# Patient Record
Sex: Female | Born: 1945 | Race: White | Hispanic: No | Marital: Married | Smoking: Never smoker
Health system: Southern US, Community
[De-identification: ages and names within clinical notes are randomized; demographics above are authoritative.]

## PROBLEM LIST (undated history)

## (undated) DIAGNOSIS — E039 Hypothyroidism, unspecified: Secondary | ICD-10-CM

## (undated) DIAGNOSIS — K8020 Calculus of gallbladder without cholecystitis without obstruction: Secondary | ICD-10-CM

## (undated) DIAGNOSIS — I10 Essential (primary) hypertension: Secondary | ICD-10-CM

## (undated) DIAGNOSIS — I480 Paroxysmal atrial fibrillation: Secondary | ICD-10-CM

## (undated) DIAGNOSIS — Z951 Presence of aortocoronary bypass graft: Secondary | ICD-10-CM

## (undated) DIAGNOSIS — E785 Hyperlipidemia, unspecified: Secondary | ICD-10-CM

## (undated) DIAGNOSIS — E119 Type 2 diabetes mellitus without complications: Secondary | ICD-10-CM

## (undated) DIAGNOSIS — I2510 Atherosclerotic heart disease of native coronary artery without angina pectoris: Secondary | ICD-10-CM

## (undated) HISTORY — DX: Type 2 diabetes mellitus without complications: E11.9

## (undated) HISTORY — DX: Calculus of gallbladder without cholecystitis without obstruction: K80.20

## (undated) HISTORY — DX: Atherosclerotic heart disease of native coronary artery without angina pectoris: I25.10

## (undated) HISTORY — DX: Essential (primary) hypertension: I10

## (undated) HISTORY — DX: Hyperlipidemia, unspecified: E78.5

## (undated) HISTORY — PX: ABDOMINAL HYSTERECTOMY: SHX81

## (undated) HISTORY — DX: Hypothyroidism, unspecified: E03.9

## (undated) HISTORY — DX: Presence of aortocoronary bypass graft: Z95.1

## (undated) HISTORY — DX: Paroxysmal atrial fibrillation: I48.0

---

## 2000-03-19 ENCOUNTER — Emergency Department (HOSPITAL_COMMUNITY): Admission: EM | Admit: 2000-03-19 | Discharge: 2000-03-20 | Payer: Self-pay | Admitting: Emergency Medicine

## 2000-07-10 ENCOUNTER — Encounter: Admission: RE | Admit: 2000-07-10 | Discharge: 2000-07-10 | Payer: Self-pay | Admitting: Obstetrics and Gynecology

## 2000-07-10 ENCOUNTER — Encounter: Payer: Self-pay | Admitting: Obstetrics and Gynecology

## 2000-07-13 ENCOUNTER — Encounter: Payer: Self-pay | Admitting: Obstetrics and Gynecology

## 2000-07-13 ENCOUNTER — Encounter: Admission: RE | Admit: 2000-07-13 | Discharge: 2000-07-13 | Payer: Self-pay | Admitting: Obstetrics and Gynecology

## 2000-07-17 ENCOUNTER — Other Ambulatory Visit: Admission: RE | Admit: 2000-07-17 | Discharge: 2000-07-17 | Payer: Self-pay | Admitting: Obstetrics and Gynecology

## 2001-11-15 ENCOUNTER — Encounter: Admission: RE | Admit: 2001-11-15 | Discharge: 2001-11-15 | Payer: Self-pay | Admitting: Obstetrics and Gynecology

## 2001-11-15 ENCOUNTER — Encounter: Payer: Self-pay | Admitting: Obstetrics and Gynecology

## 2002-04-01 ENCOUNTER — Encounter: Payer: Self-pay | Admitting: Obstetrics and Gynecology

## 2002-04-01 ENCOUNTER — Encounter: Admission: RE | Admit: 2002-04-01 | Discharge: 2002-04-01 | Payer: Self-pay | Admitting: Obstetrics and Gynecology

## 2002-04-24 ENCOUNTER — Other Ambulatory Visit: Admission: RE | Admit: 2002-04-24 | Discharge: 2002-04-24 | Payer: Self-pay | Admitting: General Surgery

## 2002-05-29 ENCOUNTER — Encounter: Payer: Self-pay | Admitting: General Surgery

## 2002-05-29 ENCOUNTER — Encounter: Admission: RE | Admit: 2002-05-29 | Discharge: 2002-05-29 | Payer: Self-pay | Admitting: General Surgery

## 2002-05-30 ENCOUNTER — Encounter (INDEPENDENT_AMBULATORY_CARE_PROVIDER_SITE_OTHER): Payer: Self-pay | Admitting: Specialist

## 2002-05-30 ENCOUNTER — Ambulatory Visit (HOSPITAL_BASED_OUTPATIENT_CLINIC_OR_DEPARTMENT_OTHER): Admission: RE | Admit: 2002-05-30 | Discharge: 2002-05-30 | Payer: Self-pay | Admitting: General Surgery

## 2002-06-09 ENCOUNTER — Emergency Department (HOSPITAL_COMMUNITY): Admission: EM | Admit: 2002-06-09 | Discharge: 2002-06-09 | Payer: Self-pay | Admitting: Emergency Medicine

## 2002-06-09 ENCOUNTER — Encounter: Payer: Self-pay | Admitting: Emergency Medicine

## 2003-04-29 ENCOUNTER — Encounter: Admission: RE | Admit: 2003-04-29 | Discharge: 2003-04-29 | Payer: Self-pay | Admitting: Obstetrics and Gynecology

## 2003-11-13 ENCOUNTER — Ambulatory Visit (HOSPITAL_COMMUNITY): Admission: RE | Admit: 2003-11-13 | Discharge: 2003-11-13 | Payer: Self-pay | Admitting: Obstetrics and Gynecology

## 2003-12-01 ENCOUNTER — Ambulatory Visit (HOSPITAL_COMMUNITY): Admission: RE | Admit: 2003-12-01 | Discharge: 2003-12-01 | Payer: Self-pay | Admitting: Obstetrics and Gynecology

## 2006-02-28 ENCOUNTER — Encounter: Admission: RE | Admit: 2006-02-28 | Discharge: 2006-02-28 | Payer: Self-pay | Admitting: Obstetrics and Gynecology

## 2006-03-05 ENCOUNTER — Encounter: Admission: RE | Admit: 2006-03-05 | Discharge: 2006-03-05 | Payer: Self-pay | Admitting: Obstetrics and Gynecology

## 2007-02-06 DIAGNOSIS — I2510 Atherosclerotic heart disease of native coronary artery without angina pectoris: Secondary | ICD-10-CM

## 2007-02-06 HISTORY — DX: Atherosclerotic heart disease of native coronary artery without angina pectoris: I25.10

## 2007-02-28 ENCOUNTER — Encounter: Admission: RE | Admit: 2007-02-28 | Discharge: 2007-02-28 | Payer: Self-pay | Admitting: Cardiology

## 2007-03-01 ENCOUNTER — Encounter: Payer: Self-pay | Admitting: Cardiothoracic Surgery

## 2007-03-01 ENCOUNTER — Ambulatory Visit: Payer: Self-pay | Admitting: Cardiothoracic Surgery

## 2007-03-01 ENCOUNTER — Inpatient Hospital Stay (HOSPITAL_COMMUNITY): Admission: AD | Admit: 2007-03-01 | Discharge: 2007-03-10 | Payer: Self-pay | Admitting: Cardiology

## 2007-03-01 ENCOUNTER — Ambulatory Visit: Payer: Self-pay | Admitting: Surgery

## 2007-03-01 HISTORY — PX: CARDIAC CATHETERIZATION: SHX172

## 2007-03-05 HISTORY — PX: CORONARY ARTERY BYPASS GRAFT: SHX141

## 2007-03-28 ENCOUNTER — Encounter (HOSPITAL_COMMUNITY): Admission: RE | Admit: 2007-03-28 | Discharge: 2007-05-08 | Payer: Self-pay | Admitting: Cardiology

## 2007-03-29 ENCOUNTER — Ambulatory Visit: Payer: Self-pay | Admitting: Cardiothoracic Surgery

## 2007-03-29 ENCOUNTER — Encounter: Admission: RE | Admit: 2007-03-29 | Discharge: 2007-03-29 | Payer: Self-pay | Admitting: Cardiothoracic Surgery

## 2007-04-08 ENCOUNTER — Encounter: Admission: RE | Admit: 2007-04-08 | Discharge: 2007-07-07 | Payer: Self-pay | Admitting: Cardiology

## 2007-04-27 ENCOUNTER — Emergency Department (HOSPITAL_COMMUNITY): Admission: EM | Admit: 2007-04-27 | Discharge: 2007-04-27 | Payer: Self-pay | Admitting: Emergency Medicine

## 2007-05-09 HISTORY — PX: INCONTINENCE SURGERY: SHX676

## 2007-05-14 ENCOUNTER — Encounter (HOSPITAL_COMMUNITY): Admission: RE | Admit: 2007-05-14 | Discharge: 2007-07-06 | Payer: Self-pay | Admitting: Cardiology

## 2008-05-07 ENCOUNTER — Encounter (INDEPENDENT_AMBULATORY_CARE_PROVIDER_SITE_OTHER): Payer: Self-pay | Admitting: *Deleted

## 2008-05-07 ENCOUNTER — Ambulatory Visit (HOSPITAL_COMMUNITY): Admission: RE | Admit: 2008-05-07 | Discharge: 2008-05-07 | Payer: Self-pay | Admitting: *Deleted

## 2008-07-03 ENCOUNTER — Encounter: Admission: RE | Admit: 2008-07-03 | Discharge: 2008-07-03 | Payer: Self-pay | Admitting: *Deleted

## 2009-02-19 ENCOUNTER — Ambulatory Visit: Payer: Self-pay | Admitting: Family Medicine

## 2009-02-19 ENCOUNTER — Inpatient Hospital Stay (HOSPITAL_COMMUNITY): Admission: AD | Admit: 2009-02-19 | Discharge: 2009-02-21 | Payer: Self-pay | Admitting: Family Medicine

## 2010-08-11 LAB — BASIC METABOLIC PANEL
BUN: 7 mg/dL (ref 6–23)
CO2: 29 mEq/L (ref 19–32)
Chloride: 102 mEq/L (ref 96–112)
Chloride: 103 mEq/L (ref 96–112)
GFR calc Af Amer: >60 mL/min (ref >60)
Potassium: 4.8 mEq/L (ref 3.5–5.1)
Sodium: 136 mEq/L (ref 135–145)
Sodium: 137 mEq/L (ref 135–145)

## 2010-08-11 LAB — DIFFERENTIAL
Basophils Absolute: 0 10*3/uL (ref 0.0–0.1)
Basophils Relative: 0 % (ref 0–1)
Eosinophils Absolute: 0.1 10*3/uL (ref 0.0–0.7)
Eosinophils Absolute: 0.1 10*3/uL (ref 0.0–0.7)
Eosinophils Relative: 2 % (ref 0–5)
Lymphocytes Relative: 16 % (ref 12–46)
Lymphs Abs: 2.2 10*3/uL (ref 0.7–4.0)
Monocytes Absolute: 1 10*3/uL (ref 0.1–1.0)
Monocytes Relative: 12 % (ref 3–12)
Neutrophils Relative %: 72 % (ref 43–77)

## 2010-08-11 LAB — GLUCOSE, CAPILLARY
Glucose-Capillary: 259 mg/dL — ABNORMAL HIGH (ref 70–99)
Glucose-Capillary: 277 mg/dL — ABNORMAL HIGH (ref 70–99)
Glucose-Capillary: 137 mg/dL — ABNORMAL HIGH (ref 70–99)
Glucose-Capillary: 50 mg/dL — ABNORMAL LOW (ref 70–99)

## 2010-08-11 LAB — URINALYSIS, MICROSCOPIC ONLY
Hgb urine dipstick: NEGATIVE
Protein, ur: NEGATIVE mg/dL
Urobilinogen, UA: 1 mg/dL (ref 0.0–1.0)

## 2010-08-11 LAB — COMPREHENSIVE METABOLIC PANEL
ALT: 21 U/L (ref 0–35)
Alkaline Phosphatase: 87 U/L (ref 39–117)
CO2: 29 mEq/L (ref 19–32)
Chloride: 103 mEq/L (ref 96–112)
GFR calc non Af Amer: 55 mL/min — ABNORMAL LOW (ref >60)
Glucose, Bld: 63 mg/dL — ABNORMAL LOW (ref 70–99)
Potassium: 3.8 mEq/L (ref 3.5–5.1)
Sodium: 138 mEq/L (ref 135–145)
Total Bilirubin: 0.5 mg/dL (ref 0.3–1.2)

## 2010-08-11 LAB — CBC
HCT: 31 % — ABNORMAL LOW (ref 36.0–46.0)
HCT: 31 % — ABNORMAL LOW (ref 36.0–46.0)
Hemoglobin: 10.8 g/dL — ABNORMAL LOW (ref 12.0–15.0)
Hemoglobin: 10.9 g/dL — ABNORMAL LOW (ref 12.0–15.0)
MCHC: 34.8 g/dL (ref 30.0–36.0)
MCV: 87.1 fL (ref 78.0–100.0)
MCV: 88.1 fL (ref 78.0–100.0)
Platelets: 227 10*3/uL (ref 150–400)
Platelets: 225 10*3/uL (ref 150–400)
RBC: 3.52 MIL/uL — ABNORMAL LOW (ref 3.87–5.11)
RBC: 3.56 MIL/uL — ABNORMAL LOW (ref 3.87–5.11)
RDW: 13.7 % (ref 11.5–15.5)
WBC: 6.3 10*3/uL (ref 4.0–10.5)
WBC: 14 10*3/uL — ABNORMAL HIGH (ref 4.0–10.5)

## 2010-08-11 LAB — LIPID PANEL
Triglycerides: 146 mg/dL (ref <150)
VLDL: 29 mg/dL (ref 0–40)

## 2010-08-11 LAB — HEMOGLOBIN A1C: Mean Plasma Glucose: 246 mg/dL

## 2010-09-20 NOTE — Cardiovascular Report (Signed)
NAME:  [name], [name]                 ACCOUNT NO.:  [id number]   MEDICAL RECORD NO.:  [id number]          PATIENT TYPE:  INP   LOCATION:  3714                         FACILITY:  [facility]   PHYSICIAN:  Ritta Slot, MD     DATE OF BIRTH:  Jun 13, 1945   DATE OF PROCEDURE:  03/01/2007  DATE OF DISCHARGE:                            CARDIAC CATHETERIZATION   CARDIAC CATHETERIZATION   CARDIOLOGIST:  Ritta Slot, MD   PROCEDURES PERFORMED:  1. Selective coronary angiography with a native coronary circulation      by Judkins.  2. Retrograde left heart catheterization.  3. Left ventricular angiography.   COMPLICATIONS:  None.   ENTRY SITE:  Right femoral.   LOCAL ANESTHETIC USED:  20 mL of 1% lidocaine.   SEDATION USED:  25 mg of fentanyl, 2 mg of Versed intravenously.   PATIENT PROFILE:  The patient is a 65 year old Caucasian female with  long-standing hypertension, diabetes, dyslipidemia who presented to the  office yesterday with exertional angina symptoms.  She did have a  nuclear perfusion scan performed in February 2008 which demonstrated no  evidence of vessel ischemia but transient ischemic dilatation with a  global post-stress ejection fraction of 38%.  At the time, she also had  ECG changes of ST segment depression, inferiorly, with ST segment  elevation, V1 and AVR.   The patient was brought to the second floor of the [facility]  for the cardiac catheterization laboratory and the right groin was  prepped and draped in the usual sterile fashion.  Lidocaine, 1%, was  infiltrated locally around the right groin area as a local anesthetic.  The right femoral artery was accessed using a Smart needle and a 6-  [place] sheath was inserted over a short wire into the right femoral  artery.  Next, the JR-4 catheter was inserted over a regular J-wire  through the 6-French sheath and was passed up the descending aorta, over  the aortic arch, and taken to the ascending aorta.   The right coronary  artery was then selectively engaged using the JR-4 catheter and images  were obtained in LAO cranial and RAO cranial views.  The JR-4 catheter  was then exchanged over the J-wire for a JL-4 catheter.  The JL-4  catheter was then passed up the descending aorta, over the aortic arch  and down the ascending aorta.  The JL-4 catheter was then engaged into  the left main coronary artery.  Views of the left coronary artery system  were taken from the LAO cranial, LAO caudal, RAO caudal, RAO cranial and  left lateral positions.  The JL-4 catheter was then exchanged out over  the J-wire for a bent pigtail catheter.  The pigtail catheter was then  inserted into the left ventricle.  Pressures were recorded from the left  ventricle.  A left ventriculogram was performed using 30 mL of contrast  at 10 mL/sec in the RAO projection.   FINDINGS:  Pressures:  The left ventricular pressure was 158/13 with an  end diastolic of 22.  The aortic pressure was 138/63 with a  mean of 92.  There was no aortic stenosis by pullback gradient technique.   ANGIOGRAPHIC RESULTS:  1. Left main coronary artery appears normal.  2. The left anterior descending artery has a 99% proximal lesion just      off the bifurcation of the left anterior descending and circumflex.      It is a narrow vessel but long and reaches far down toward the      apex.  The left circumflex system bifurcates early in its proximal      portion of a small circumflex and a large obtuse marginal one.  At      the bifurcation of the circumflex, there is a 99% lesion; at the      bifurcation of  the larger obtuse marginal one, there is a 99%      lesion as well.  There is a ramus intermedius vessel that comes off      of the bifurcation of the left main circumflex and left anterior      descending.  There is a proximal, 70%, lesion at that vessel.  The      right coronary artery is dominant and large and supplies the       posterolateral and posterior descending arteries.  There is a 90%      lesion in its proximal course.   LEFT VENTRICULOGRAPHY:  Showed mild to moderate global hypokinesis and  an ejection fraction of 40- to 45%.  There is no evidence of mitral  regurgitation.   IMPRESSION/FINAL DIAGNOSES:  Severe native, 3-vessel, coronary artery  disease, mild left ventricular dysfunction, most likely secondary to  hibernation.   PLAN:  The patient will be admitted to [facility]  for carotid  Doppler and CT surgery consultation.  She will be kept in the hospital  until she has her surgery.      Ritta Slot, MD  Electronically Signed     HS/MEDQ  D:  03/01/2007  T:  03/01/2007  Job:  [id number]

## 2010-09-20 NOTE — Discharge Summary (Signed)
[name], [name]                 ACCOUNT NO.:  [id number]   MEDICAL RECORD NO.:  [id number]          PATIENT TYPE:  INP   LOCATION:  2018                         FACILITY:  [facility]   PHYSICIAN:  Kerin Perna, M.D.  DATE OF BIRTH:  April 25, 1946   DATE OF ADMISSION:  03/01/2007  DATE OF DISCHARGE:  03/10/2007                               DISCHARGE SUMMARY   PRIMARY ADMITTING DIAGNOSIS:  Chest pain.   ADDITIONAL/DISCHARGE DIAGNOSES:  1. Severe three-vessel coronary artery disease.  2. Class III angina.  3. Type 2 diabetes mellitus.  4. Hypertension.  5. Dyslipidemia.  6. 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  7. Postoperative atrial fibrillation.   PROCEDURES PERFORMED:  1. Cardiac catheterization.  2. Coronary artery bypass grafting x4 (left internal mammary artery to      the LAD, saphenous vein graft to the PDA, saphenous vein graft to      the first ramus, saphenous vein graft to the second ramus).  3. Endoscopic vein harvest left leg.   HISTORY:  The patient is a 65 year old female who over the month  preceding this admission developed symptoms of chest discomfort with  exertion.  This was relieved with rest.  She was subsequently referred  to Dr. Lynnea Ferrier for further evaluation.  She had undergone a previous  nuclear perfusion study in February 2008 which showed significant ST and  T-wave changes on the treadmill.  Also she had multiple risk factors for  coronary artery disease as listed above.  Because of this, Dr. Lynnea Ferrier  felt that her symptoms warranted cardiac catheterization, and she was  scheduled for outpatient cath at [facility] on March 01, 2007.   HOSPITAL COURSE:  [name] underwent cardiac catheterization on March 01, 2007 by Dr. Lynnea Ferrier.  She was found to have severe three-vessel  coronary disease with a long 99% proximal LAD stenosis, 90% stenosis of  the right coronary artery, 70% stenosis of the ramus intermediate and  90% stenosis of the small  nondominant circumflex with an ejection  fraction of 40-45%.  Because of these findings, she was admitted and a  cardiothoracic surgery consultation was obtained.  Dr. Kathlee Nations Trigt  saw the patient and reviewed her films and agreed that her best course  of action would be to undergo surgical revascularization at this time.  He explained the risks, benefits and alternatives of the procedure to  the patient, and she agreed to proceed with surgery.  She remained  stable and painfree in the hospital during her preoperative workup.  She  was taken to the operating room on March 05, 2007 and underwent CABG  x4 as described in detail above.  She tolerated the procedure well and  was transferred to the SICU in stable condition.  She developed swelling  of the tongue postoperatively of unclear etiology and was treated with  IV steroids and remained intubated until this resolved.  She was able to  be extubated on postop day two without difficulty.  She remained in the  unit for further observation.  She was started on diuretics for  mild  fluid overload.  Chest tubes and hemodynamic monitoring devices were  removed.  By the end of postop day #2 she was able to be transferred to  the floor.  She did have one episode of atrial fibrillation/flutter  which resolved spontaneously.  She was started on p.o. amiodarone and  continued on a beta blocker and ACE inhibitor and statin.  Also once her  p.o. intake had improved her home diabetes medication regimen was  resumed, and the dose titrated upward.  Overall her postoperative course  was uneventful.  She maintained normal sinus rhythm throughout the  remainder of her hospitalization.  She was diuresed back down to her  preoperative weight.  At the time of discharge her incisions have healed  well.  She was afebrile, and her vital signs were stable.   Her most recent labs show hemoglobin 10.2, hematocrit 29.7, platelets  192, white count 11.3 sodium  139, potassium 3.9, BUN 14, creatinine 0.4.   After morning round evaluation she was deemed ready for discharge home  on March 10, 2007.   DISCHARGE MEDICATIONS:  1. Enteric-coated aspirin 325 mg daily.  2. Lopressor 25 mg b.i.d.  3. Lisinopril 20 mg daily.  4. Zetia 10 mg daily.  5. Zocor 10 mg daily.  6. Humulin N 43 units b.i.d.  7. Humalog sliding scale insulin as directed.  8. Lasix 20 mg daily.  9. Effexor 150 mg daily.  10.Amiodarone 400 mg b.i.d. x 5 days then once daily.  11.Multivitamin 1 daily.  12.Vitamin C daily.  13.Oxycodone 5 mg 1-2 q.4-6h. p.r.n. for pain.   DISCHARGE INSTRUCTIONS:  She is asked to refrain from driving, heavy  lifting or strenuous activity.  She may continue ambulating daily and  using her incentive spirometer.  She may shower daily and clean her  incisions with soap and water.  She will continue her same preoperative  diet.   DISCHARGE FOLLOWUP:  She will make an appointment to see Dr. Lynnea Ferrier in  2 weeks for follow-up.  She will see Dr. Donata Clay on March 29, 2007  with a chest x-ray from [facility].  If she experiences any  problems or has questions in the interim she is asked to contact our  office immediately.      Coral Ceo, P.A.      Kerin Perna, M.D.  Electronically Signed    GC/MEDQ  D:  04/17/2007  T:  04/18/2007  Job:  [id number]   cc:   Olene Craven, M.D.  Ritta Slot, MD

## 2010-09-20 NOTE — Op Note (Signed)
[name], [name]                 ACCOUNT NO.:  [id number]   MEDICAL RECORD NO.:  [id number]          PATIENT TYPE:  AMB   LOCATION:  ENDO                         FACILITY:  [facility]   PHYSICIAN:  Georgiana Spinner, M.D.    DATE OF BIRTH:  1945-09-04   DATE OF PROCEDURE:  05/07/2008  DATE OF DISCHARGE:                               OPERATIVE REPORT   PROCEDURE:  Colonoscopy.   INDICATIONS:  Abdominal pain, colon cancer screening.   ANESTHESIA:  Fentanyl 50 mcg, Versed 3 mg.   PROCEDURE IN DETAIL:  The patient indicated that she had taken no more  than 3/4 of the prep at most.  Subsequently the Pentax videoscopic  pediatric colonoscope was inserted into the rectum and it was noted that  there was solid stool in the rectum and obviously it was going to be a  poor prep.  We had to withdraw the scope, clear off the lens, reinsert  the endoscope and advance it, subsequently under direct vision with  pressure applied and the patient rolled to her back we were able to  subsequently reach the cecum identified by ileocecal valve and  appendiceal orifice, both of which were photographed.  From this point  the colonoscope was slowly withdrawn taking circumferential views of  colonic mucosa stopping in the transverse colon where a polyp was seen,  photographed and removed using snare cautery technique setting of 20/150  blended current and there was a remnant of the polyp remaining which we  then grasped with a hot biopsy forceps tech and removed for pathology.  The endoscope was then further withdrawn taking circumferential views of  remaining colonic mucosa stopping in the rectum which appeared normal on  direct and retroflexed view showed hemorrhoidal tissue.  The endoscope  was straightened and withdrawn.  The patient's vital signs, pulse  oximeter remained stable.  The patient tolerated procedure well without  apparent complications.   FINDINGS:  Pandiverticulosis, moderately severe polyp of  transverse  colon, internal hemorrhoids and this was a poor prep, therefore, small  to even medium sized lesions could be missed.  The patient did not take  the prep as ordered.   PLAN:  Await biopsy report.  The patient will call me for results and  follow up with me as an outpatient.           ______________________________  Georgiana Spinner, M.D.     GMO/MEDQ  D:  05/07/2008  T:  05/07/2008  Job:  [id number]

## 2010-09-20 NOTE — Op Note (Signed)
[name], [name]                 ACCOUNT NO.:  [id number]   MEDICAL RECORD NO.:  [id number]          PATIENT TYPE:  AMB   LOCATION:  ENDO                         FACILITY:  [facility]   PHYSICIAN:  Georgiana Spinner, M.D.    DATE OF BIRTH:  01-24-1946   DATE OF PROCEDURE:  05/07/2008  DATE OF DISCHARGE:                               OPERATIVE REPORT   PROCEDURE:  Upper endoscopy.   INDICATIONS:  Abdominal pain.   ANESTHESIA:  Fentanyl 50 mcg, Versed 6 mg   PROCEDURE:  With the patient mildly sedated in the left lateral  decubitus position,  the Pentax videoscopic endoscope was inserted in  the mouth, passed under direct vision through the esophagus which  appeared normal into the stomach.  Fundus, body, antrum, duodenal bulb,  second portion duodenum were visualized and appeared normal.  From this  point the endoscope was slowly withdrawn, taking circumferential views  of duodenal mucosa until the endoscope had been pulled back into the  stomach, placed in retroflexion to view the stomach from below.  The  endoscope was straightened and withdrawn, taking circumferential views  of the remaining gastric and esophageal mucosa.  The patient's vital  signs and pulse oximeter remained stable.  The patient tolerated the  procedure well without apparent complications.   FINDINGS:  Normal examination planned.  Proceed to colonoscopy.           ______________________________  Georgiana Spinner, M.D.     GMO/MEDQ  D:  05/07/2008  T:  05/07/2008  Job:  [id number]

## 2010-09-20 NOTE — Op Note (Signed)
NAMEAVON, MOLOCK                 ACCOUNT NO.:  [id number]   MEDICAL RECORD NO.:  [id number]          PATIENT TYPE:  INP   LOCATION:  2312                         FACILITY:  [facility]   PHYSICIAN:  Kerin Perna, M.D.  DATE OF BIRTH:  09-21-1945   DATE OF PROCEDURE:  03/05/2007  DATE OF DISCHARGE:                               OPERATIVE REPORT   OPERATION:  1. Coronary artery bypass grafting x4 (left internal mammary artery to      left anterior descending, saphenous vein graft to ramus      intermediate, saphenous vein graft to obtuse marginal, saphenous      vein graft to posterior descending).  2. Endoscopic vein harvest of the greater saphenous vein from the left      leg.   SURGEON:  Kerin Perna, M.D.   ASSISTANT:  Danelle Earthly, PA-C   ANESTHESIA:  General.   PREOPERATIVE DIAGNOSIS:  Severe three-vessel coronary disease with class  III progressive angina and reduced left ventricular function.   POSTOPERATIVE DIAGNOSIS:  Severe three-vessel coronary disease with  class III progressive angina and reduced left ventricular function.   INDICATIONS:  The patient is a 65 year old female with exertional angina  with increasing frequency and severity.  Cardiac catheterization by Dr.  Lynnea Ferrier demonstrated severe three-vessel coronary disease and a diabetic  pattern.  She was felt to be a candidate for surgical coronary  revascularization and was scheduled for surgery today.  She remained  stable in the hospital following cath before the surgery.   Prior to the operation, I reviewed the results of cardiac cath with the  patient and her family.  I discussed the indications and expected  benefits of coronary bypass surgery for treatment of her coronary  disease.  I reviewed the alternatives to surgical therapy as well.  I  discussed the major issues of the procedure including the choice of  conduit for grafts, location of the surgical incisions, use of general  anesthesia  and cardiopulmonary bypass, and the expected postoperative  hospital recovery.  I reviewed with the patient the risks to her of this  core of this operation, to the risks of MI, CVA, bleeding, blood  transfusion, infection, and death.  After reviewing these issues, she  demonstrated her understanding agreed to proceed with operation as  planned under what I felt was an informed consent.   OPERATIVE FINDINGS:  The patient had a sluggish hypocontractile heart  upon opening the chest up.  The patient had baseline anemia and required  3 units of packed cell transfusion during the operation maintain a  hemoglobin of 8 grams or greater.  The coronaries had diffuse disease in  a diabetic pattern.  The distal circumflex vessels were too small to  graft.  The patient has a right aortic arch which altered the usual  approach to aortic cannulation.   DESCRIPTION OF PROCEDURE:  The patient was brought to the operating room  and placed on the operating table where general anesthesia was induced  under invasive hemodynamic monitoring.  The chest, abdomen and legs were  prepped  with Betadine and draped as a sterile field.  A sternal incision  was made and the saphenous vein was harvested endoscopically from the  left leg.  The right leg was opened but the vein was found to be  inadequate and no vein was removed from the right leg.  The left  internal mammary artery was harvested as a pedicle graft from its origin  at the subclavian vessels and was a good vessel with excellent flow.  The pericardium was opened and suspended and heparin was administered.  When the vein had been harvested and inspected and found be adequate,  the patient was heparinized and pursestrings were placed in the  ascending aorta and right atrium.  The patient was then cannulated and  placed on bypass.  The coronaries were identified for grafting and the  mammary artery and vein grafts were prepared for the distal anastomoses.   Cardioplegia catheters were placed for both antegrade and retrograde  cold blood cardioplegia.  The patient was cooled to 32 degrees and  aortic crossclamp was applied.  800 mL of cold blood cardioplegia was  delivered in split doses between the antegrade aortic and retrograde  coronary sinus catheters.  There is good cardioplegic arrest and septal  temperature dropped to less than 10 degrees.   The distal coronary anastomoses were performed.  The first distal  anastomosis was to the posterior descending.  This a 1.5-mm vessel with  proximal 90% stenosis and a reverse saphenous vein was sewn end-to-side  with running 7-0 Prolene with good flow through the graft.  The second  distal anastomosis was to the ramus intermediate.  This was a 1.0-mm  vessel with heavy calcified 90% stenosis proximally.  A reverse  saphenous vein was sewn end-to-side with running 7-0 Prolene with good  flow through graft.  Cardioplegia was redosed.  The third distal  anastomosis was to the OM branch just distal to the ramus.  This was a  1.0-mm vessel proximal 90% stenosis and a calcified plaque.  A reverse  saphenous vein was sewn end-to-side with running 7-0 Prolene.  There was  fair flow through this graft.  Cardioplegia was redosed.  The fourth  distal anastomosis was to the distal LAD.  It was heavily calcified  proximally.  It had a proximal 99% stenosis.  Left IMA pedicle was  brought through an opening created in the left lateral pericardium, was  brought down onto the LAD and sewn end-to-side with running 8-0 Prolene.  There was good flow through the anastomosis after briefly releasing the  pedicle bulldog on the mammary artery.  The mammary bulldog was  reapplied and a pedicle secured to the epicardium.  Cardioplegia was  redosed.   While the crossclamp was still in place, three proximal vein anastomoses  were performed on the ascending aorta, using a 4.0-mm punch running 7-0  Prolene.  Prior to tying  down the final proximal anastomosis, air was  vented from the coronaries with a dose of retrograde warm blood  cardioplegia.  The final proximal anastomosis was tied and the  crossclamp was removed.   The heart resumed a spontaneous rhythm.  Air was aspirated from the vein  grafts which were opened and each had good flow.  Hemostasis was  documented at the proximal and distal anastomoses.  The cardioplegia  catheters were removed.  Temporary pacing wires were applied.  The  patient was rewarmed to 37 degrees.  The lungs were then re-expanded and  the ventilator  was resumed.  The patient was weaned off bypass on low-  dose dopamine with stable blood pressure and good cardiac output.  Protamine was administered without adverse reaction.  The cannulas were  removed.  Mediastinum was irrigated with warm antibiotic irrigation.  Leg incision was irrigated and closed in a standard fashion.  The  superior pericardial fat was closed over the aorta.  Two mediastinal and  left pleural chest tube were placed and brought out through separate  incisions.  The sternum was closed with interrupted steel wire.  The  pectoralis fascia was closed in running #1 Vicryl.  The subcutaneous and  skin layers were closed in running Vicryl and sterile dressings were  applied.  Total bypass time was 130 minutes with crossclamp time of 90  minutes.      Kerin Perna, M.D.  Electronically Signed    PV/MEDQ  D:  03/05/2007  T:  03/06/2007  Job:  [id number]   cc:   DCTS office  [facility]

## 2010-09-20 NOTE — Assessment & Plan Note (Signed)
OFFICE VISIT   [name], Tara K  DOB:  July 20, 1945                                        March 29, 2007  CHART #:  [id number]   CURRENT PROBLEMS:  1. Status post CABG x 4 March 05, 2007, for severe 3-coronary      disease with class 3 angina and reduced LV function.  2. Diabetes mellitus.  3. Right-sided aortic arch.  4. Perioperative atrial fibrillation with conversion to sinus rhythm      on amiodarone.   HISTORY OF PRESENT ILLNESS:  [name] is a 65 year old diabetic who  returns for her first postop office visit after CABG x 4 for multivessel  coronary disease with class 3 progressive angina.  She is doing well at  home.  She denies recurrent angina or symptoms of CHF and the surgical  incisions are healing well.  She is watching her diet and taking her  medications, which include lisinopril 20 mg, Zetia 10 mg, Cartia XT 180  mg, Lasix 20 mg, aspirin, metoprolol 25 b.i.d., Humulin insulin,  Effexor, and amiodarone.  The amiodarone was recently reduced to once a  day by Dr. Lynnea Ferrier.  She has maintained a sinus rhythm.  She is  scheduled to begin cardiac rehab next week and overall is getting  stronger and feeling better.   PHYSICAL EXAMINATION:  Blood pressure 105/60, pulse 72, respirations 18  and saturation 98%.  She is alert and pleasant.  Breath sounds are clear  and equal.  The sternum is stable and well-healed.  Cardiac rhythm is  regular without rub or gallop.  The leg incisions are well healed, and  there is trace ankle edema.  Vein was harvested from both thighs  endoscopically.   PA and lateral chest x-ray shows clear lung fields without pleural  effusion and a stable cardiac silhouette with the sternal wires well  aligned.   IMPRESSION:  The patient has progressed well the first month after  surgery.  She is ready to start rehab and drive herself back and forth  from cardiac rehab to the hospital.  She will continue her  current  medications.  She does not request any more pain medication.  She knows  to start low activity levels and to not lift more than 20 pounds until 3  months after the date of surgery.  Otherwise, she appears to be  recovering well and will return here as needed.   Kerin Perna, M.D.  Electronically Signed   PV/MEDQ  D:  03/29/2007  T:  03/30/2007  Job:  [id number]   cc:   Ritta Slot, MD  Olene Craven, M.D.

## 2010-09-20 NOTE — Discharge Summary (Signed)
[name], [name]                 ACCOUNT NO.:  [id number]   MEDICAL RECORD NO.:  [id number]          PATIENT TYPE:  INP   LOCATION:  3714                         FACILITY:  [facility]   PHYSICIAN:  Kerin Perna, M.D.  DATE OF BIRTH:  08-08-1945   DATE OF ADMISSION:  03/01/2007  DATE OF DISCHARGE:                               DISCHARGE SUMMARY   PRIMARY CARE PHYSICIAN:  Dr. Barbee Shropshire.   CONSULTANT:  Kerin Perna, MD.   REASON FOR CONSULTATION:  Severe three-vessel coronary artery disease  with class III angina.   CHIEF COMPLAINT:  Chest pain.   HISTORY OF PRESENT ILLNESS:  I was asked to evaluate this 65 year old  diabetic hypertensive female for surgical coronary revascularization for  recently diagnosed severe three-vessel coronary artery disease.  The  patient has had reproducible chest pain over the past few weeks while  walking into work from the parking lot.  This is always relieved with  rest and resolved after 10-15 minutes.  She denies any resting chest  pain.  She has noticed increased fatigue however with her usual daily  activities.  She denies any orthopnea, edema or diaphoresis, nausea,  syncope or PND.  She underwent a stress test which showed ejection  fraction of 38%.  For that reason, she underwent cardiac catheterization  by Dr. Lynnea Ferrier.  This demonstrated severe multivessel coronary artery  disease with a long 99% proximal LAD stenosis, 90% stenosis of the right  coronary, 70% stenosis of a ramus intermediate, and a 90% stenosis of  the small nondominant circumflex.  Her EF was 40-45% and LVEDP was 22  mmHg.  A chest wall 2-D echo taken during this hospitalization  demonstrates EF of 45% with global hypokinesia.  No significant mitral  regurgitation or tricuspid regurgitation.  Based on the patient's  coronary anatomy and symptoms.  She is felt to be candidate for surgical  revascularization.   PAST MEDICAL HISTORY:  1. Diabetes mellitus.  2.  Hypertension.  3. 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  4. Gallstones.  5. Dyslipidemia.  6. Intolerance to Lipitor.   HOME MEDICATIONS:  1. Lisinopril 20 mg a day.  2. Zetia 10 mg a day.  3. Effexor 150 mg a day.  4. Cardia XT 180 mg daily.  5. Lasix 20 mg a day.  6. Aspirin 325 mg a day.  7. Multivitamin one a day.  8. Insulin 43 units b.i.d.  9. Humalog sliding scale at home.   SOCIAL HISTORY:  The patient works in Theatre stage manager at [employer].  She  also works for her church.  She is married with adult children and a  grandchild.  She does not smoke or use alcohol.   FAMILY HISTORY:  Positive for coronary artery disease and myocardial  infarction, negative for cardiac surgery.   REVIEW OF SYSTEMS:  CONSTITUTIONAL:  Review is negative fever, weight  loss.  ENT: Review is negative difficulty swallowing or dental problems.  THORACIC:  Review is negative for history of chest trauma or abnormal  chest x-ray other than her right-sided aortic arch.  She  denies any  recent symptoms of upper respiratory infection.  GI: Review is positive  gallstones and upper abdominal pain when she straightens up from a  sitting or prone position.  She denies any change in bowel habits or  blood per rectum.  She has had pelvic surgery and a bladder suspension  for stress incontinence.  ENDOCRINE:  Review is positive for diabetes.  Negative for thyroid disease.  HEMATOLOGIC:  Review is negative bleeding  disorder or blood transfusion.  NEUROVASCULAR:  Review is negative for  DVT or claudication.  MUSCULOSKELETAL: Review is positive for some leg  cramps.  NEUROLOGIC:  Review is negative for neuropathy stroke or  seizure.   PHYSICAL EXAMINATION:  VITAL SIGNS:  Height 5 feet 2 inches, weight 165  pounds, blood pressure 130/80 pulse 80, respirations 18.  GENERAL APPEARANCE:  A pleasant middle-aged white female in her hospital  room following cardiac catheterization yesterday in no distress.   HEENT:  Exam is normocephalic.  NECK:  Supple without JVD, mass or carotid bruits.  LYMPHATICS:  Reveal no palpable supraclavicular or cervical adenopathy.  Breath sounds are clear and equal.  CARDIAC:  Exam is regular rhythm without S3, gallop or murmur.  ABDOMINAL:  Exam is obese, soft, nontender.  EXTREMITIES:  Reveal mild ankle edema, clubbing or cyanosis.  She has 1+  pedal pulses, 2+ radial pulses and she is right-hand dominant  NEUROLOGIC:  Exam is intact and she is alert and oriented.   LABORATORY DATA:  I reviewed the coronary arteriograms and 2-D echo, and  this patient has severe multivessel coronary disease with mild to  moderate reduction in LV function and no significant valvular  insufficiency.  Her chest x-ray shows right-sided aortic arch and her  LVEDP is moderately elevated.  Her carotid Doppler study showed no  significant carotid stenosis, her brachial artery pressures are equal  bilaterally.  Her ABIs are 1.0 in each lower extremity.  She has no  evidence of venous insufficiency.   PLAN:  The patient will be scheduled for surgical coronary  revascularization on October the 28 with plans to graft the LAD with the  left internal mammary artery and vein grafts to ramus intermediate,  circumflex marginal and the posterior descending or distal right  coronary.  I discussed the plan for surgery in detail of the procedure  with the patient including the benefits and risks, and she understands  and agrees to proceed.      Kerin Perna, M.D.  Electronically Signed     PV/MEDQ  D:  03/02/2007  T:  03/04/2007  Job:  [id number]

## 2010-09-23 NOTE — Op Note (Signed)
NAME:  [name]                           ACCOUNT NO.:  [id number]   MEDICAL RECORD NO.:  [id number]                   PATIENT TYPE:  AMB   LOCATION:  SDC                                  [facility]  [facility]   PHYSICIAN:  Zenaida Niece, M.D.             DATE OF BIRTH:  26-Aug-1945   DATE OF PROCEDURE:  12/01/2003  DATE OF DISCHARGE:                                 OPERATIVE REPORT   PREOPERATIVE DIAGNOSIS:  Stress urinary incontinence.   POSTOPERATIVE DIAGNOSIS:  Stress urinary incontinence.   PROCEDURE:  Tension-free vaginal tape.   SURGEON:  Zenaida Niece, M.D.   ANESTHESIA:  IV sedation and local block.   ESTIMATED BLOOD LOSS:  50 mL.   FINDINGS:  Hypermobile urethra but otherwise normal anatomy.   DESCRIPTION OF PROCEDURE:  The patient was taken to the operating room and  placed in the dorsal supine position.  Legs were then placed in mobile  stirrups.  The patient was given IV sedation.  The suprapubic area was then  shaved.  The perineum and vagina were then prepped and draped in the usual  sterile fashion and bladder drained with a red rubber catheter.  Local  anesthesia was then performed with a mixture of 1% lidocaine and 0.5%  Marcaine with epinephrine, 15 mL were placed in the suprapubic area on each  side approximately 3 cm lateral to the midline just superior to the pubic  symphysis.  The vaginal mucosa for approximately 1 cm proximal to the  urethral meatus was also anesthetized with local anesthesia and this was  taken out laterally with local anesthesia as well.  A 1.5 cm vertical  incision was made in the vaginal mucosa at this region and using both sharp  dissection with the scalpel and with Metzenbaum scissors, the mucosa was  dissected from the underlying bladder laterally on both sides to make room  for the TVT.  The patient's bladder was then emptied and the Foley catheter  placed with the catheter guide in place.  The catheter guide was inserted  and pushed to the patient's right leg.  The TVT was then placed with the  vaginal needle on the right side.  An incision was made in the suprapubic  area at the previous site of local anesthetic and the needle pushed through  the skin.  A similar procedure was then performed on the left side with the  catheter guide pulled to the patient's left leg.  Cystoscopy was then  performed with 25 degree and 70 degree cystoscopes.  The bladder appeared  normal and I was not able to visualize either needle in the bladder.  The  needles were then pulled through the skin and this went without  complications.  The needles were then removed from the TVT.  The TVT was  pulled snug up against the urethra but with enough space to allow curved  Mayo  scissors to pass easily between the urethra and the sling.  With the  full bladder, the patient was able to cough and did leak a small amount of  urine.  The TVT was tightened minimally and felt to be in good position.  The sleeves were then removed.  The mesh was cut just below the skin edges  superiorly.  The vaginal incision was closed with running, locking 2-0  Vicryl.  The suprapubic skin incisions were closed with Dermabond.  The  patient tolerated the procedure  well and was taken to the recovery room in stable condition.  She will be  monitored to make sure she can void and then hopefully go home within two  hours.  The patient received Ancef 1 g prior to the procedure. Counts were  correct x2.                                               Zenaida Niece, M.D.    TDM/MEDQ  D:  12/01/2003  T:  12/01/2003  Job:  [id number]

## 2010-09-23 NOTE — H&P (Signed)
NAME:  [name], [name]                           ACCOUNT NO.:  [id number]   MEDICAL RECORD NO.:  [id number]                   PATIENT TYPE:  AMB   LOCATION:  SDC                                  [facility]AC[facility]   PHYSICIAN:  Zenaida Niece, M.D.             DATE OF BIRTH:  04/02/1946   DATE OF ADMISSION:  11/13/2003  DATE OF DISCHARGE:                                HISTORY & PHYSICAL   CHIEF COMPLAINT:  Stress urinary incontinence.   HISTORY OF PRESENT ILLNESS:  This is a 65 year old white female para 1-0-0-1  whom I saw for an annual examination on October 07, 2003.  She has had a history  of stress urinary incontinence and states that this is getting slightly  worse to the point where she has to wear a pad every day and is having  external irritation from this pad.  On physical examination she had a  positive stress test where she leaked with cough and has no significant  vaginal descensus.  All options have been discussed with the patient and she  wishes to proceed with TVT.   ALLERGIES:  No known drug allergies.   CURRENT MEDICATIONS:  Effexor XR, Lasix, Prinivil and Humulin and Humalog  insulin.   PAST OBSTETRIC HISTORY:  Vaginal delivery at 40 weeks.  Baby weighed 7  pounds 11 ounces.   PAST GYNECOLOGIC HISTORY:  No abnormal Pap smears or sexually transmitted  diseases.   PAST MEDICAL HISTORY:  1. Type 2 diabetes, well controlled.  2. Hypertension, well controlled.   PAST SURGICAL HISTORY:  1. She has had a D&C.  2. She has had a total abdominal hysterectomy with bilateral salpingo-     oophorectomy.  3. History of dilatation of urethral strictures.  4. Right breast biopsy.   SOCIAL HISTORY:  The patient denies alcohol, tobacco or drug use and is  sexually active with decreased sex drive.   FAMILY HISTORY:  Maternal aunt with breast cancer.  Maternal aunt and  maternal grandmother with colon cancer.   REVIEW OF SYMPTOMS:  Normal bowel movements and is otherwise  negative.   PHYSICAL EXAMINATION:  GENERAL APPEARANCE:  This is a well-developed, well-  nourished white female in no acute distress.  VITAL SIGNS:  Weight is 173 pounds, blood pressure 126/60, pulse 90.  NECK:  Supple without lymphadenopathy or thyromegaly.  LUNGS:  Clear to auscultation.  CARDIOVASCULAR:  Regular rate and rhythm without murmur.  ABDOMEN:  Soft, nontender and nondistended without palpable masses.  BREASTS:  Examination in the sitting and supine position reveals no dominant  masses, adenopathy, skin change or nipple discharge.  She does have a scar  on her right breast from a prior breast biopsy.  PELVIC:  External genitalia shows no lesions.  On speculum examination, the  vagina is normal without significant rectocele or cystocele.  On manual  examination, she has no palpable masses and  she is nontender and this is  confirmed by rectovaginal examination.  She did leak urine with a cough on a  spontaneous stress test.   ASSESSMENT:  Stress urinary incontinence.  All options have been discussed  with the patient and the patient wishes to proceed with surgical therapy.  Risks of a TVT including bleeding, infection and damage to bowels, bladder  or ureter have been discussed with the patient.  She also understands that  there is a 10 to 20% chance that this will not cure her incontinence.   PLAN:  Admit the patient on the day of surgery on an outpatient basis for a  TVT.                                               Zenaida Niece, M.D.    TDM/MEDQ  D:  11/12/2003  T:  11/12/2003  Job:  [id number]

## 2010-09-23 NOTE — Op Note (Signed)
NAME:  [name], [name]                           ACCOUNT NO.:  [id number]   MEDICAL RECORD NO.:  [id number]                   PATIENT TYPE:  AMB   LOCATION:  DSC                                  FACILITY:  [facility]   PHYSICIAN:  Adolph Pollack, M.D.            DATE OF BIRTH:  29-Mar-1946   DATE OF PROCEDURE:  05/30/2002  DATE OF DISCHARGE:                                 OPERATIVE REPORT   PREOPERATIVE DIAGNOSIS:  Right breast mass.   POSTOPERATIVE DIAGNOSIS:  Right breast mass.   PROCEDURE:  Excision of right breast mass.   SURGEON:  Adolph Pollack, M.D.   ANESTHESIA:  Local (1% lidocaine with epinephrine plus 0.5% plain Marcaine  with sodium bicarbonate (MAC).   INDICATIONS:  Ms. Guyett is a 65 year old female with a palpable right breast  mass.  Fine needle aspiration demonstrated some slight atypia.  I  recommended that she undergo excisional biopsy, and now, she presents for  that.  The procedure and the risks were explained to her preoperatively.   TECHNIQUE:  She was seen in the holding area and the right chest wall  marked.  She was then brought to the operating room and placed supine on the  operating table, and the examination was done, and the mass was marked with  a marking pen.  She was then given intravenous sedation.  The right breast  was sterilely prepped and draped.  A local anesthetic was infiltrated in the  12 o'clock region and a curvilinear incision made directly over the mass.  Skin flaps were raised in all directions, and the mass was excised sharply.  Deep to the mass were also some firm areas, and I excised these and sent  them separately as deep margin.  This was sent for extra pathology.   Next, I inspected the biopsy bed, and then bleeding points were controlled  with cautery.  I injected some local anesthetic into the biopsy bed.  Once  hemostasis was adequate, I then closed the subcutaneous tissue loosely with  interrupted 3-0 Vicryl sutures  and closed the skin with a 4-0 Monocryl  subcuticular stitch.  Steri-Strips and sterile dressings were applied.   She tolerated the procedure well without any apparent complications.  She  was taken to the recovery room in satisfactory condition.  Discharge  instructions will be given to her, as well as Vicodin for pain, and she will  see me back in the office in 7-10 days.                                               Adolph Pollack, M.D.    Kari Baars  D:  05/30/2002  T:  05/31/2002  Job:  [id number]   cc:   Olene Craven, M.D.  [address]  [address]  [place]  [place] [address]  Fax: [phone]

## 2011-02-10 LAB — URINALYSIS, ROUTINE W REFLEX MICROSCOPIC
Bilirubin Urine: NEGATIVE
Glucose, UA: NEGATIVE
Ketones, ur: NEGATIVE
Nitrite: POSITIVE — AB
Protein, ur: 100 — AB
Specific Gravity, Urine: 1.016
Urobilinogen, UA: 1
pH: 5.5

## 2011-02-10 LAB — COMPREHENSIVE METABOLIC PANEL WITH GFR
ALT: 21
AST: 23
Albumin: 3.9
Alkaline Phosphatase: 110
BUN: 21
CO2: 26
Calcium: 9.2
Chloride: 101
Creatinine, Ser: 0.97
GFR calc non Af Amer: 58 — ABNORMAL LOW
Glucose, Bld: 150 — ABNORMAL HIGH
Potassium: 4.3
Sodium: 136
Total Bilirubin: 0.6
Total Protein: 7.2

## 2011-02-10 LAB — DIFFERENTIAL
Basophils Absolute: 0.3 — ABNORMAL HIGH
Basophils Relative: 2 — ABNORMAL HIGH
Eosinophils Relative: 1
Monocytes Absolute: 0.7
Monocytes Relative: 4
Neutro Abs: 14.1 — ABNORMAL HIGH

## 2011-02-10 LAB — CBC
HCT: 34.4 — ABNORMAL LOW
Platelets: 343
RDW: 14.8
WBC: 16.3 — ABNORMAL HIGH

## 2011-02-10 LAB — URINE CULTURE: Colony Count: >=100000

## 2011-02-10 LAB — GLUCOSE, CAPILLARY: Glucose-Capillary: 235 mg/dL — ABNORMAL HIGH (ref 70–99)

## 2011-02-10 LAB — URINE MICROSCOPIC-ADD ON

## 2011-02-10 LAB — LIPASE, BLOOD: Lipase: 25

## 2011-02-15 LAB — COMPREHENSIVE METABOLIC PANEL
ALT: 18
AST: 19
Albumin: 3.7
Alkaline Phosphatase: 93
BUN: 12
CO2: 30
Calcium: 9.4
Chloride: 104
Creatinine, Ser: 0.94
GFR calc Af Amer: >60
GFR calc non Af Amer: >60
Glucose, Bld: 57 — ABNORMAL LOW
Potassium: 3 — ABNORMAL LOW
Sodium: 142
Total Bilirubin: 0.5
Total Protein: 6.9

## 2011-02-15 LAB — BLOOD GAS, ARTERIAL
Acid-Base Excess: 3 — ABNORMAL HIGH
Bicarbonate: 27.7 — ABNORMAL HIGH
Drawn by: 28700
FIO2: 0.21
O2 Saturation: 90.1
Patient temperature: 98.6
TCO2: 29.2
pCO2 arterial: 48 — ABNORMAL HIGH
pH, Arterial: 7.38
pO2, Arterial: 60.4 — ABNORMAL LOW

## 2011-02-15 LAB — BASIC METABOLIC PANEL
BUN: 16
CO2: 29
Chloride: 104
Glucose, Bld: 212 — ABNORMAL HIGH
Glucose, Bld: 89
Potassium: 4.2
Potassium: 4.2
Sodium: 135

## 2011-02-15 LAB — POCT I-STAT 3, ART BLOOD GAS (G3+)
Bicarbonate: 24
Bicarbonate: 24.2 — ABNORMAL HIGH
Bicarbonate: 24.4 — ABNORMAL HIGH
Bicarbonate: 25.8 — ABNORMAL HIGH
O2 Saturation: 97
Operator id: 241461
Operator id: 3402
Patient temperature: 35.2
TCO2: 25
TCO2: 25
TCO2: 26
TCO2: 27
pCO2 arterial: 37
pCO2 arterial: 38
pCO2 arterial: 42.3
pCO2 arterial: 42.6
pH, Arterial: 7.359
pH, Arterial: 7.385
pH, Arterial: 7.414 — ABNORMAL HIGH
pH, Arterial: 7.427 — ABNORMAL HIGH
pO2, Arterial: 80
pO2, Arterial: 88

## 2011-02-15 LAB — BASIC METABOLIC PANEL WITH GFR
BUN: 11
BUN: 14
BUN: 15
CO2: 25
CO2: 30
CO2: 30
Calcium: 7.9 — ABNORMAL LOW
Calcium: 8.2 — ABNORMAL LOW
Calcium: 8.3 — ABNORMAL LOW
Chloride: 101
Chloride: 106
Chloride: 98
Creatinine, Ser: 0.77
Creatinine, Ser: 0.82
Creatinine, Ser: 0.84
GFR calc Af Amer: >60
GFR calc Af Amer: >60
GFR calc Af Amer: >60
GFR calc non Af Amer: >60
GFR calc non Af Amer: >60
GFR calc non Af Amer: >60
Glucose, Bld: 109 — ABNORMAL HIGH
Glucose, Bld: 139 — ABNORMAL HIGH
Glucose, Bld: 97
Potassium: 3.6
Potassium: 3.9
Potassium: 4.3
Sodium: 135
Sodium: 137
Sodium: 139

## 2011-02-15 LAB — CBC
HCT: 29.4 — ABNORMAL LOW
HCT: 29.7 — ABNORMAL LOW
HCT: 30.3 — ABNORMAL LOW
HCT: 30.7 — ABNORMAL LOW
HCT: 31.1 — ABNORMAL LOW
HCT: 31.6 — ABNORMAL LOW
HCT: 36.2
Hemoglobin: 10.2 — ABNORMAL LOW
Hemoglobin: 10.5 — ABNORMAL LOW
Hemoglobin: 10.6 — ABNORMAL LOW
Hemoglobin: 10.6 — ABNORMAL LOW
Hemoglobin: 10.8 — ABNORMAL LOW
Hemoglobin: 12.2
Hemoglobin: 9.9 — ABNORMAL LOW
MCHC: 33.7
MCHC: 33.8
MCHC: 34
MCHC: 34.1
MCHC: 34.2
MCHC: 34.3
MCHC: 34.8
MCV: 85.7
MCV: 86.6
MCV: 86.8
MCV: 86.9
MCV: 87
MCV: 87.2
MCV: 87.2
Platelets: 159
Platelets: 169
Platelets: 180
Platelets: 188
Platelets: 192
Platelets: 193
Platelets: 278
RBC: 3.38 — ABNORMAL LOW
RBC: 3.42 — ABNORMAL LOW
RBC: 3.52 — ABNORMAL LOW
RBC: 3.54 — ABNORMAL LOW
RBC: 3.6 — ABNORMAL LOW
RBC: 3.62 — ABNORMAL LOW
RDW: 13.9
RDW: 14.1 — ABNORMAL HIGH
RDW: 14.2 — ABNORMAL HIGH
RDW: 14.2 — ABNORMAL HIGH
RDW: 14.3 — ABNORMAL HIGH
RDW: 14.3 — ABNORMAL HIGH
RDW: 14.4 — ABNORMAL HIGH
WBC: 11.3 — ABNORMAL HIGH
WBC: 11.6 — ABNORMAL HIGH
WBC: 11.7 — ABNORMAL HIGH
WBC: 11.8 — ABNORMAL HIGH
WBC: 16.2 — ABNORMAL HIGH
WBC: 17.8 — ABNORMAL HIGH
WBC: 8.9

## 2011-02-15 LAB — PLATELET COUNT: Platelets: 176

## 2011-02-15 LAB — TYPE AND SCREEN

## 2011-02-15 LAB — MAGNESIUM
Magnesium: 2.2
Magnesium: 2.3
Magnesium: 2.6 — ABNORMAL HIGH

## 2011-02-15 LAB — POCT I-STAT 4, (NA,K, GLUC, HGB,HCT)
Glucose, Bld: 144 — ABNORMAL HIGH
Glucose, Bld: 198 — ABNORMAL HIGH
Glucose, Bld: 247 — ABNORMAL HIGH
HCT: 25 — ABNORMAL LOW
HCT: 25 — ABNORMAL LOW
HCT: 30 — ABNORMAL LOW
HCT: 33 — ABNORMAL LOW
Hemoglobin: 10.2 — ABNORMAL LOW
Hemoglobin: 11.2 — ABNORMAL LOW
Hemoglobin: 8.5 — ABNORMAL LOW
Hemoglobin: 8.8 — ABNORMAL LOW
Operator id: 241461
Operator id: 3402
Operator id: 3402
Potassium: 3.4 — ABNORMAL LOW
Potassium: 3.9
Potassium: 4.1
Potassium: 4.7
Sodium: 134 — ABNORMAL LOW
Sodium: 134 — ABNORMAL LOW
Sodium: 138

## 2011-02-15 LAB — CREATININE, SERUM
Creatinine, Ser: 0.7
Creatinine, Ser: 0.85
GFR calc Af Amer: >60
GFR calc Af Amer: >60
GFR calc non Af Amer: >60
GFR calc non Af Amer: >60

## 2011-02-15 LAB — URINALYSIS, ROUTINE W REFLEX MICROSCOPIC
Bilirubin Urine: NEGATIVE
Glucose, UA: NEGATIVE
Hgb urine dipstick: NEGATIVE
Specific Gravity, Urine: 1.018
pH: 5.5

## 2011-02-15 LAB — I-STAT 8, (EC8 V) (CONVERTED LAB)
BUN: 18
Bicarbonate: 27.1 — ABNORMAL HIGH
Chloride: 102
Glucose, Bld: 122 — ABNORMAL HIGH
HCT: 30 — ABNORMAL LOW
Hemoglobin: 10.2 — ABNORMAL LOW
Operator id: 274841
Potassium: 4.1
Sodium: 136
TCO2: 29
pCO2, Ven: 53.5 — ABNORMAL HIGH
pH, Ven: 7.313 — ABNORMAL HIGH

## 2011-02-15 LAB — I-STAT EC8
BUN: 11
Bicarbonate: 25.8 — ABNORMAL HIGH
Chloride: 105
Glucose, Bld: 99
HCT: 29 — ABNORMAL LOW
Hemoglobin: 9.9 — ABNORMAL LOW
Operator id: 297551
Potassium: 4.5
Sodium: 140
TCO2: 27
pCO2 arterial: 49.1 — ABNORMAL HIGH
pH, Arterial: 7.329 — ABNORMAL LOW

## 2011-02-15 LAB — HEMOGLOBIN A1C
Hgb A1c MFr Bld: 9.1 — ABNORMAL HIGH
Mean Plasma Glucose: 247

## 2011-02-15 LAB — HEMOGLOBIN AND HEMATOCRIT, BLOOD: HCT: 26.9 — ABNORMAL LOW

## 2011-02-15 LAB — PROTIME-INR
INR: 1.3
Prothrombin Time: 16 — ABNORMAL HIGH

## 2011-02-15 LAB — APTT: aPTT: 34

## 2011-02-15 LAB — PREPARE PLATELET PHERESIS

## 2011-02-15 LAB — PREPARE FRESH FROZEN PLASMA

## 2011-05-30 ENCOUNTER — Ambulatory Visit (INDEPENDENT_AMBULATORY_CARE_PROVIDER_SITE_OTHER): Payer: 59

## 2011-05-30 DIAGNOSIS — J111 Influenza due to unidentified influenza virus with other respiratory manifestations: Secondary | ICD-10-CM

## 2011-05-30 DIAGNOSIS — IMO0001 Reserved for inherently not codable concepts without codable children: Secondary | ICD-10-CM

## 2011-05-30 DIAGNOSIS — R071 Chest pain on breathing: Secondary | ICD-10-CM

## 2011-05-30 DIAGNOSIS — J9801 Acute bronchospasm: Secondary | ICD-10-CM

## 2011-07-07 HISTORY — PX: NM MYOCAR PERF WALL MOTION: HXRAD629

## 2011-07-07 HISTORY — PX: TRANSTHORACIC ECHOCARDIOGRAM: SHX275

## 2011-09-04 ENCOUNTER — Ambulatory Visit (INDEPENDENT_AMBULATORY_CARE_PROVIDER_SITE_OTHER): Payer: 59

## 2011-09-12 ENCOUNTER — Ambulatory Visit (INDEPENDENT_AMBULATORY_CARE_PROVIDER_SITE_OTHER): Payer: 59 | Admitting: Family Medicine

## 2011-09-12 VITALS — BP 107/68 | HR 90 | Temp 98.1°F | Resp 18 | Ht 62.0 in | Wt 162.2 lb

## 2011-09-12 DIAGNOSIS — N390 Urinary tract infection, site not specified: Secondary | ICD-10-CM

## 2011-09-12 DIAGNOSIS — R1904 Left lower quadrant abdominal swelling, mass and lump: Secondary | ICD-10-CM

## 2011-09-12 DIAGNOSIS — R3915 Urgency of urination: Secondary | ICD-10-CM

## 2011-09-12 LAB — POCT URINALYSIS DIPSTICK
Glucose, UA: NEGATIVE
Nitrite, UA: NEGATIVE
Protein, UA: 100
Spec Grav, UA: >=1.03
Urobilinogen, UA: 0.2
pH, UA: 5

## 2011-09-12 LAB — POCT UA - MICROSCOPIC ONLY
Casts, Ur, LPF, POC: NEGATIVE
Crystals, Ur, HPF, POC: NEGATIVE
Mucus, UA: NEGATIVE
Yeast, UA: NEGATIVE

## 2011-09-12 MED ORDER — CIPROFLOXACIN HCL 250 MG PO TABS: 250.0000 mg | ORAL_TABLET | Freq: Two times a day (BID) | ORAL | Status: AC

## 2011-09-12 NOTE — Patient Instructions (Signed)

## 2011-09-12 NOTE — Progress Notes (Signed)
This 66 year old woman comes in because evaluated for lower abdominal urinary pressure and urgency. She's had symptoms for one week and he said intercristal past urinary infections. No UTI in over 6 months. These symptoms started last week. She's had no nausea, vomiting, fever, hematuria  Patient also notes some lower abdominal pain which he attributes to mild diverticulitis. Her last diverticulitis attack was one year ago  Objective: No acute distress, very pleasant woman  CVA: Nontender  Abdomen minimally tender in the suprapubic region  U/A positive for leukocyte esterase  A:  UTI, concern about the pelvic fullnes  P:  Pelvic US cipro Urine culture

## 2011-09-12 NOTE — Progress Notes (Signed)
  Subjective:    Patient ID: [name], female    DOB: [birth year], 66 y.o.   MRN: [id number]  HPI    Review of Systems     Objective:   Physical Exam  Sterile procedure followed for in and out cath.  Very little urine obtained but collected the sample in a sterile urine cup.  Patient tolerated well.  Bimanual reveals fullness and firmness in LLQ when manipulated does not reproduce an urge to defecate.  Discussed with Dr. Milus Glazier.       Assessment & Plan:

## 2011-09-13 NOTE — Progress Notes (Signed)
Addended by: Thelma Barge D on: 09/13/2011 11:46 AM   Modules accepted: Orders

## 2011-09-15 ENCOUNTER — Ambulatory Visit
Admission: RE | Admit: 2011-09-15 | Discharge: 2011-09-15 | Disposition: A | Discharge status: Home / Self Care | Payer: 59 | Source: Ambulatory Visit | Attending: Family Medicine | Admitting: Family Medicine

## 2011-09-15 ENCOUNTER — Telehealth: Payer: Self-pay

## 2011-09-15 DIAGNOSIS — R3915 Urgency of urination: Secondary | ICD-10-CM

## 2011-09-15 DIAGNOSIS — R1904 Left lower quadrant abdominal swelling, mass and lump: Secondary | ICD-10-CM

## 2011-09-15 DIAGNOSIS — N390 Urinary tract infection, site not specified: Secondary | ICD-10-CM

## 2011-09-15 NOTE — Telephone Encounter (Signed)
   Pt would like to know if scan results came in today and the results.  Okay to lmom  [phone]

## 2011-09-16 NOTE — Telephone Encounter (Signed)
LMOM to CB. 

## 2011-09-16 NOTE — Telephone Encounter (Signed)
[place] was normal.  If she has further questions, we can have Dr. Elbert Ewings call pt.

## 2011-09-18 NOTE — Telephone Encounter (Signed)
Spoke w/ pt who had gotten a call from someone else this morn. She doesn't have any further ?s.

## 2011-12-08 ENCOUNTER — Encounter: Payer: Self-pay | Admitting: Emergency Medicine

## 2012-12-17 ENCOUNTER — Encounter: Payer: Self-pay | Admitting: Cardiology

## 2012-12-17 ENCOUNTER — Ambulatory Visit (INDEPENDENT_AMBULATORY_CARE_PROVIDER_SITE_OTHER): Payer: 59 | Admitting: Cardiology

## 2012-12-17 VITALS — BP 124/68 | HR 71 | Ht 62.0 in | Wt 161.8 lb

## 2012-12-17 DIAGNOSIS — E039 Hypothyroidism, unspecified: Secondary | ICD-10-CM | POA: Insufficient documentation

## 2012-12-17 DIAGNOSIS — I480 Paroxysmal atrial fibrillation: Secondary | ICD-10-CM | POA: Insufficient documentation

## 2012-12-17 DIAGNOSIS — F32A Depression, unspecified: Secondary | ICD-10-CM | POA: Insufficient documentation

## 2012-12-17 DIAGNOSIS — I4891 Unspecified atrial fibrillation: Secondary | ICD-10-CM

## 2012-12-17 DIAGNOSIS — E119 Type 2 diabetes mellitus without complications: Secondary | ICD-10-CM | POA: Insufficient documentation

## 2012-12-17 DIAGNOSIS — I2510 Atherosclerotic heart disease of native coronary artery without angina pectoris: Secondary | ICD-10-CM

## 2012-12-17 DIAGNOSIS — F329 Major depressive disorder, single episode, unspecified: Secondary | ICD-10-CM

## 2012-12-17 DIAGNOSIS — I10 Essential (primary) hypertension: Secondary | ICD-10-CM | POA: Insufficient documentation

## 2012-12-17 DIAGNOSIS — E785 Hyperlipidemia, unspecified: Secondary | ICD-10-CM | POA: Insufficient documentation

## 2012-12-17 LAB — COMPREHENSIVE METABOLIC PANEL
ALT: 17 U/L (ref 0–35)
AST: 18 U/L (ref 0–37)
Albumin: 4.3 g/dL (ref 3.5–5.2)
Alkaline Phosphatase: 92 U/L (ref 39–117)
BUN: 17 mg/dL (ref 6–23)
CO2: 29 mEq/L (ref 19–32)
Calcium: 9.3 mg/dL (ref 8.4–10.5)
Chloride: 98 mEq/L (ref 96–112)
Creat: 1.25 mg/dL — ABNORMAL HIGH (ref 0.50–1.10)
Glucose, Bld: 178 mg/dL — ABNORMAL HIGH (ref 70–99)
Potassium: 4.1 mEq/L (ref 3.5–5.3)
Sodium: 137 mEq/L (ref 135–145)
Total Bilirubin: 0.3 mg/dL (ref 0.3–1.2)
Total Protein: 7.3 g/dL (ref 6.0–8.3)

## 2012-12-17 LAB — LIPID PANEL
Cholesterol: 117 mg/dL (ref 0–200)
HDL: 38 mg/dL — ABNORMAL LOW (ref >39)
LDL Cholesterol: 20 mg/dL (ref 0–99)
Total CHOL/HDL Ratio: 3.1 Ratio
Triglycerides: 297 mg/dL — ABNORMAL HIGH (ref <150)
VLDL: 59 mg/dL — ABNORMAL HIGH (ref 0–40)

## 2012-12-17 LAB — TSH: TSH: 4.704 u[IU]/mL — ABNORMAL HIGH (ref 0.350–4.500)

## 2012-12-17 NOTE — Progress Notes (Signed)
12/17/2012 [name]   [birth year]  [id number]  Primary Physicia Dorisann Frames, MD Primary Cardiologist: Dr Rennis Golden  HPI:  67 y/o followed by Dr Rennis Golden with a history of CABG X 4 10/08. Myoview 3/13 low risk for ischemia. She is here for 6 month check up. Since we saw her last she has been doing well, no chest pain, palpitations, or unusual SOB. She did tell me she stopped her Synthroid, apparently for no particular reason.    Current Outpatient Prescriptions  Medication Sig Dispense Refill  . aspirin 81 MG tablet Take 81 mg by mouth daily.      Marland Kitchen BIOTIN FORTE PO Take by mouth 2 (two) times daily.      . Calcium-Vitamin D (CALTRATE 600 PLUS-VIT D PO) Take by mouth.      . cholecalciferol (VITAMIN D) 1000 UNITS tablet Take 1,000 Units by mouth daily.      Marland Kitchen co-enzyme Q-10 30 MG capsule Take 30 mg by mouth 3 (three) times daily.      Marland Kitchen ezetimibe-simvastatin (VYTORIN) 10-40 MG per tablet Take 1 tablet by mouth at bedtime.      . furosemide (LASIX) 20 MG tablet Take 20 mg by mouth 2 (two) times daily.      . insulin lispro protamine-insulin lispro (HUMALOG 50/50) (50-50) 100 UNIT/ML SUSP Inject into the skin 2 (two) times daily before a meal. Sliding scale      . insulin regular (NOVOLIN R,HUMULIN R) 100 units/mL injection Inject 50 Units into the skin 2 (two) times daily before a meal.      . lisinopril (PRINIVIL,ZESTRIL) 20 MG tablet Take 20 mg by mouth 2 (two) times daily.      . metoprolol succinate (TOPROL-XL) 25 MG 24 hr tablet Take 25 mg by mouth 2 (two) times daily.      Marland Kitchen venlafaxine XR (EFFEXOR-XR) 150 MG 24 hr capsule Take 150 mg by mouth daily.      . vitamin C (ASCORBIC ACID) 500 MG tablet Take 500 mg by mouth daily.      Marland Kitchen levothyroxine (SYNTHROID, LEVOTHROID) 50 MCG tablet Take 50 mcg by mouth daily.       No current facility-administered medications for this visit.    No Known Allergies  History   Social History  . Marital Status: Married    Spouse Name: N/A    Number  of Children: N/A  . Years of Education: N/A   Occupational History  . Not on file.   Social History Main Topics  . Smoking status: Never Smoker   . Smokeless tobacco: Not on file  . Alcohol Use: No  . Drug Use: No  . Sexually Active: Not on file   Other Topics Concern  . Not on file   Social History Narrative  . No narrative on file     Review of Systems: General: negative for chills, fever, night sweats or weight changes.  Cardiovascular: negative for chest pain, dyspnea on exertion, edema, orthopnea, palpitations, paroxysmal nocturnal dyspnea or shortness of breath Dermatological: negative for rash Respiratory: negative for cough or wheezing Urologic: negative for hematuria Abdominal: negative for nausea, vomiting, diarrhea, bright red blood per rectum, melena, or hematemesis Neurologic: negative for visual changes, syncope, or dizziness All other systems reviewed and are otherwise negative except as noted above. She has peripheral neuropathy from diabetes, she has UTI's treated previously,     Blood pressure 124/68, pulse 71, height 5\' 2"  (1.575 m), weight 161 lb 12.8 oz (73.392 kg).  General appearance: alert, cooperative and no distress Neck: no carotid bruit and no JVD Lungs: clear to auscultation bilaterally Heart: regular rate and rhythm  EKG  NSR, IVCD, NSST changes.  ASSESSMENT AND PLAN:   CAD- CABG X 4 10/08 Low risk Myoview 3/13. No angina  Diabetes Peripheral neuropathy  Dyslipidemia Vytorin 10/40 added 2/14  Hypothyroid She stopped Synthroid on her own  PAF (paroxysmal atrial fibrillation) Post CABG but holding NSR since  HTN (hypertension) Controlled   PLAN  Check labs today, Cmet and Lipids. We will also check a TSH. I have encouraged her to follow this up with her primary MD.   Corine Shelter KPA-C 12/17/2012 9:54 AM

## 2012-12-17 NOTE — Assessment & Plan Note (Signed)
She stopped Synthroid on her own

## 2012-12-17 NOTE — Assessment & Plan Note (Signed)
Post CABG but holding NSR since

## 2012-12-17 NOTE — Patient Instructions (Addendum)
Labs today. See Dr Rennis Golden in 6 months. Check with Dr Lurene Shadow about your thyroid medication.

## 2012-12-17 NOTE — Assessment & Plan Note (Signed)
Low risk Myoview 3/13. No angina

## 2012-12-17 NOTE — Assessment & Plan Note (Signed)
Vytorin 10/40 added 2/14

## 2012-12-17 NOTE — Assessment & Plan Note (Signed)
Controlled.  

## 2012-12-17 NOTE — Assessment & Plan Note (Signed)
Peripheral neuropathy 

## 2012-12-24 ENCOUNTER — Telehealth: Payer: Self-pay | Admitting: *Deleted

## 2012-12-24 DIAGNOSIS — Z79899 Other long term (current) drug therapy: Secondary | ICD-10-CM

## 2012-12-24 DIAGNOSIS — E782 Mixed hyperlipidemia: Secondary | ICD-10-CM

## 2012-12-24 MED ORDER — SIMVASTATIN 10 MG PO TABS: 10.0000 mg | ORAL_TABLET | Freq: Every day | ORAL | Status: DC

## 2012-12-24 NOTE — Telephone Encounter (Signed)
Message copied by Vita Barley on Tue Dec 24, 2012 10:34 AM ------      Message from: Abelino Derrick      Created: Wed Dec 18, 2012  1:53 PM       Her TSH is a little low. I had instructed her to follow up with Dr Lurene Shadow. I would stop Vytorin, (her LDL is low). Start Zocor 10mg  daily. Check lipids and LFTs in 3 months.            Corine Shelter PA-C      12/18/2012      1:52 PM       ------

## 2012-12-24 NOTE — Telephone Encounter (Signed)
Lab results called to patient.  Simvastatin 10mg  QD order and sent to pharmacy.  Lab order sent to patient for recheck in 3 months.

## 2013-08-13 ENCOUNTER — Other Ambulatory Visit: Payer: Self-pay | Admitting: *Deleted

## 2013-08-13 MED ORDER — FUROSEMIDE 20 MG PO TABS: 20.0000 mg | ORAL_TABLET | Freq: Two times a day (BID) | ORAL | Status: DC

## 2013-08-14 ENCOUNTER — Other Ambulatory Visit: Payer: Self-pay | Admitting: Internal Medicine

## 2013-08-14 ENCOUNTER — Telehealth: Payer: Self-pay | Admitting: *Deleted

## 2013-08-14 DIAGNOSIS — E785 Hyperlipidemia, unspecified: Secondary | ICD-10-CM

## 2013-08-14 MED ORDER — EZETIMIBE-SIMVASTATIN 10-40 MG PO TABS: 1.0000 | ORAL_TABLET | Freq: Every day | ORAL | Status: DC

## 2013-08-14 NOTE — Telephone Encounter (Signed)
Rx was sent to pharmacy electronically. 

## 2013-08-15 ENCOUNTER — Other Ambulatory Visit: Payer: Self-pay | Admitting: Internal Medicine

## 2013-08-15 NOTE — Telephone Encounter (Signed)
Rx was sent to pharmacy electronically. 

## 2013-08-19 ENCOUNTER — Telehealth: Payer: Self-pay | Admitting: *Deleted

## 2013-08-28 ENCOUNTER — Other Ambulatory Visit: Payer: Self-pay

## 2013-08-28 MED ORDER — METOPROLOL TARTRATE 25 MG PO TABS
25.0000 mg | ORAL_TABLET | Freq: Two times a day (BID) | ORAL | Status: DC
Start: 2013-08-28 — End: 2014-03-06

## 2013-08-28 NOTE — Telephone Encounter (Signed)
Rx was sent to pharmacy electronically. 

## 2013-09-12 NOTE — Telephone Encounter (Signed)
Encounter Closed---5/8 TP 

## 2013-10-02 ENCOUNTER — Encounter: Payer: Self-pay | Admitting: *Deleted

## 2013-10-03 ENCOUNTER — Encounter: Payer: Self-pay | Admitting: Internal Medicine

## 2013-10-07 ENCOUNTER — Ambulatory Visit: Payer: 59 | Admitting: Internal Medicine

## 2013-11-11 ENCOUNTER — Other Ambulatory Visit: Payer: Self-pay | Admitting: Internal Medicine

## 2013-11-11 NOTE — Telephone Encounter (Signed)
Rx refill sent to patient pharmacy   

## 2014-03-06 ENCOUNTER — Other Ambulatory Visit: Payer: Self-pay | Admitting: *Deleted

## 2014-03-06 MED ORDER — EZETIMIBE-SIMVASTATIN 10-40 MG PO TABS: 1.0000 | ORAL_TABLET | Freq: Every day | ORAL | Status: DC

## 2014-03-06 MED ORDER — METOPROLOL TARTRATE 25 MG PO TABS: 25.0000 mg | ORAL_TABLET | Freq: Two times a day (BID) | ORAL | Status: DC

## 2014-03-06 MED ORDER — LISINOPRIL 20 MG PO TABS: ORAL_TABLET | ORAL | Status: DC

## 2014-03-06 MED ORDER — FUROSEMIDE 20 MG PO TABS: 20.0000 mg | ORAL_TABLET | Freq: Two times a day (BID) | ORAL | Status: DC

## 2014-03-06 NOTE — Telephone Encounter (Signed)
30 day supply refilled electronically with a notation: Need to schedule an appointment for future refills.

## 2014-03-09 ENCOUNTER — Other Ambulatory Visit: Payer: Self-pay | Admitting: *Deleted

## 2014-03-09 MED ORDER — METOPROLOL TARTRATE 25 MG PO TABS: 25.0000 mg | ORAL_TABLET | Freq: Two times a day (BID) | ORAL | Status: DC

## 2014-03-09 MED ORDER — FUROSEMIDE 20 MG PO TABS: 20.0000 mg | ORAL_TABLET | Freq: Two times a day (BID) | ORAL | Status: DC

## 2014-03-09 MED ORDER — LISINOPRIL 20 MG PO TABS: ORAL_TABLET | ORAL | Status: DC

## 2014-03-09 MED ORDER — EZETIMIBE-SIMVASTATIN 10-40 MG PO TABS: 1.0000 | ORAL_TABLET | Freq: Every day | ORAL | Status: DC

## 2014-03-09 NOTE — Telephone Encounter (Signed)
REFILLED ELECTRONICALLY WITH ADJUSTMENT TO MAIL ORDER PHARMACY and a notation to schedule an appointment for future refills.

## 2014-04-15 ENCOUNTER — Encounter: Payer: Self-pay | Admitting: Internal Medicine

## 2014-04-15 ENCOUNTER — Ambulatory Visit (INDEPENDENT_AMBULATORY_CARE_PROVIDER_SITE_OTHER): Payer: 59 | Admitting: Internal Medicine

## 2014-04-15 VITALS — BP 144/70 | HR 69 | Ht 62.0 in | Wt 161.8 lb

## 2014-04-15 DIAGNOSIS — I2583 Coronary atherosclerosis due to lipid rich plaque: Principal | ICD-10-CM

## 2014-04-15 DIAGNOSIS — E785 Hyperlipidemia, unspecified: Secondary | ICD-10-CM

## 2014-04-15 DIAGNOSIS — I480 Paroxysmal atrial fibrillation: Secondary | ICD-10-CM

## 2014-04-15 DIAGNOSIS — I10 Essential (primary) hypertension: Secondary | ICD-10-CM

## 2014-04-15 DIAGNOSIS — Z23 Encounter for immunization: Secondary | ICD-10-CM

## 2014-04-15 DIAGNOSIS — I2510 Atherosclerotic heart disease of native coronary artery without angina pectoris: Secondary | ICD-10-CM

## 2014-04-15 NOTE — Progress Notes (Signed)
[name]   [birth year]  [id number]  Primary Mack HookPhysicia BALAN,BINDUBAL, MD Primary Cardiologist: Dr Rennis GoldenHilty  HPI:  68 y/o followed by Dr Rennis GoldenHilty with a history of CABG X 4 10/08. Myoview 3/13 low risk for ischemia. She is here for 6 month check up. Since we saw her last she has been doing well, no chest pain, palpitations, or unusual SOB. She last saw Corine ShelterLuke Kilroy who noted that she had discontinued her Synthroid for unknown reasons. She was since seen by her endocrinologist and those were restarted. She has her diabetes and cholesterol checked by her endocrinologist as well. She denies any chest pain or worsening shortness of breath. She does have some leg swelling. When she saw Corine ShelterLuke Kilroy, last in August, her LDL was 20. He recommended she stop Vytorin and switch to low-dose simvastatin. She did not do that.   Current Outpatient Prescriptions  Medication Sig Dispense Refill  . aspirin 81 MG tablet Take 81 mg by mouth daily.    Marland Kitchen. BIOTIN FORTE PO Take by mouth 2 (two) times daily.    . Calcium-Vitamin D (CALTRATE 600 PLUS-VIT D PO) Take by mouth.    . cholecalciferol (VITAMIN D) 1000 UNITS tablet Take 1,000 Units by mouth daily.    Marland Kitchen. co-enzyme Q-10 30 MG capsule Take 30 mg by mouth 3 (three) times daily.    . furosemide (LASIX) 40 MG tablet Take 40 mg by mouth.    . insulin lispro protamine-insulin lispro (HUMALOG 50/50) (50-50) 100 UNIT/ML SUSP Inject into the skin 2 (two) times daily before a meal. Sliding scale    . insulin regular (NOVOLIN R,HUMULIN R) 100 units/mL injection Inject 50 Units into the skin 2 (two) times daily before a meal.    . levothyroxine (SYNTHROID, LEVOTHROID) 50 MCG tablet Take 50 mcg by mouth daily.    Marland Kitchen. lisinopril (PRINIVIL,ZESTRIL) 20 MG tablet TAKE 1 TABLET TWICE DAILY. Need to schedule an appointment for future refills. 60 tablet 0  . metoprolol tartrate (LOPRESSOR) 25 MG tablet Take 1 tablet (25 mg total) by mouth 2 (two) times daily. Need to  schedule an appointment for future refills. 60 tablet 0  . simvastatin (ZOCOR) 10 MG tablet Take 1 tablet (10 mg total) by mouth at bedtime. 90 tablet 3  . venlafaxine XR (EFFEXOR-XR) 150 MG 24 hr capsule Take 150 mg by mouth daily.    . vitamin C (ASCORBIC ACID) 500 MG tablet Take 500 mg by mouth daily.     No current facility-administered medications for this visit.    Allergies  Allergen Reactions  . Crestor [Rosuvastatin]   . Lipitor [Atorvastatin]     History   Social History  . Marital Status: Married    Spouse Name: N/A    Number of Children: 1  . Years of Education: 13   Occupational History  .  [employer]   Social History Main Topics  . Smoking status: Never Smoker   . Smokeless tobacco: Not on file  . Alcohol Use: No  . Drug Use: No  . Sexual Activity: Not on file   Other Topics Concern  . Not on file   Social History Narrative     Review of Systems: General: negative for chills, fever, night sweats or weight changes.  Cardiovascular: negative for chest pain, dyspnea on exertion, edema, orthopnea, palpitations, paroxysmal nocturnal dyspnea or shortness of breath Dermatological: negative for rash Respiratory: negative for cough or wheezing Urologic: negative for hematuria Abdominal: negative for  nausea, vomiting, diarrhea, bright red blood per rectum, melena, or hematemesis Neurologic: negative for visual changes, syncope, or dizziness All other systems reviewed and are otherwise negative except as noted above. She has peripheral neuropathy from diabetes, she has UTI's treated previously,   Blood pressure 144/70, pulse 69, height 5\' 2"  (1.575 m), weight 161 lb 12.8 oz (73.392 kg).  General appearance: alert, cooperative and no distress Neck: no carotid bruit and no JVD Lungs: clear to auscultation bilaterally Heart: regular rate and rhythm  EKG  NSR at 69, IVCD, NSST changes.  ASSESSMENT AND PLAN:   Patient Active Problem List   Diagnosis  Date Noted  . 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 Diabetes 12/17/2012  . HTN (hypertension) 12/17/2012  . PAF (paroxysmal atrial fibrillation) 12/17/2012  . Dyslipidemia 12/17/2012  . Hypothyroid 12/17/2012  . Depression 12/17/2012    PLAN  1.  Doing well without active chest pain. Diabetes is not well controlled with A1c between 7 and 8. Blood pressure is at goal today. Her cholesterol was very low recently in August with LDL 20. It was advised that she stop Vytorin and start on low-dose simvastatin. She never made that change. I agree with the change and would recommend doing that today. We then will recheck her cholesterol in 3 months. I recommend follow-up annually or sooner as necessary. Finally, she requested a flu shot and we will provide that today.  Chrystie NoseKenneth C. Nicholaus Steinke, MD, [facility] Attending Cardiologist [facility]  Gryphon Vanderveen C 04/15/2014 1:09 PM

## 2014-04-15 NOTE — Patient Instructions (Signed)
Your physician has recommended you make the following change in your medication: STOP vytorin. TAKE simvastatin.   Please have fasting labs in 3 months.   Your physician wants you to follow-up in: 1 year with Dr. Rennis GoldenHilty. You will receive a reminder letter in the mail two months in advance. If you don't receive a letter, please call our office to schedule the follow-up appointment.

## 2014-05-13 ENCOUNTER — Ambulatory Visit (INDEPENDENT_AMBULATORY_CARE_PROVIDER_SITE_OTHER): Payer: 59 | Admitting: Family Medicine

## 2014-05-13 VITALS — BP 126/78 | HR 93 | Temp 98.9°F | Resp 17 | Ht 61.5 in | Wt 159.0 lb

## 2014-05-13 DIAGNOSIS — J069 Acute upper respiratory infection, unspecified: Secondary | ICD-10-CM

## 2014-05-13 DIAGNOSIS — H6691 Otitis media, unspecified, right ear: Secondary | ICD-10-CM

## 2014-05-13 MED ORDER — AMOXICILLIN 875 MG PO TABS: 875.0000 mg | ORAL_TABLET | Freq: Two times a day (BID) | ORAL | Status: DC

## 2014-05-13 NOTE — Progress Notes (Signed)
Subjective:  This chart was scribed for [name] by [facility] Demirci,scribe, at [facility]  This patient was seen in room 10 and the patient's care was started at 4:31 PM.   Patient ID: [name], female    DOB: 18-Nov-1945, 69 y.o.   MRN: [id number]  HPI  HPI Comments: [name] is a 70 y.o. female who presents to [facility] complaining of a sore throat, fatigue, and URI. Patient has a history of CAD and CABG in October 2008.  Last seen by her cardiologist Dr. Rennis Golden on December 9th. She is followed by Dr. Talmage Nap for diabetes and uses insulin for this. Presents today with sore throat, fatigue and URI. Patients throat started hurting 4-5 days ago and has a cough which she has been taking mucinex as well as associated fatigue. She notes of having difficulty getting the phlegm out of her throat when she coughs. Patient states that she hears her heartbeat thorough her ears and has more pain in her right ear. She has not yet measured her fever but says that her face feels flushed and warm.  She has not checked her BP recently.  Patient was supposed to have blood work 2 days ago but could not make it. She has an appointment again tomorrow but has not yet decided if she will go or not. Patient denies fever. Her grand kids who she has had close contact with have cold symptoms.      Review of Systems  Constitutional: Positive for fatigue. Negative for fever.  HENT: Positive for ear pain and sore throat.        Objective:   Physical Exam  Constitutional: She is oriented to person, place, and time. She appears well-developed and well-nourished. No distress.  HENT:  Head: Normocephalic and atraumatic.  Right Ear: Hearing, tympanic membrane, external ear and ear canal normal.  Left Ear: Hearing, tympanic membrane, external ear and ear canal normal.  Nose: Nose normal.  Mouth/Throat: Oropharynx is clear and moist. No oropharyngeal exudate.  Slightly  dull TM on the left  No erythema   Dull TM with minimal erythema on the right. Erythema injection is posterior and inferior but the canal is clear without discharge.    Eyes: Conjunctivae and EOM are normal. Pupils are equal, round, and reactive to light.  Neck: No JVD present. Carotid bruit is not present.  No lymphadenopathy   No stridor   minimal redness , no exudate or pus  Moist mucosa.     Cardiovascular: Normal rate, regular rhythm, normal heart sounds and intact distal pulses.   No murmur heard. Pulmonary/Chest: Effort normal and breath sounds normal. No respiratory distress. She has no wheezes. She has no rhonchi. She has no rales.  No distress   Neurological: She is alert and oriented to person, place, and time.  Skin: Skin is warm and dry. No rash noted.  Psychiatric: She has a normal mood and affect. Her behavior is normal.  Vitals reviewed.  Filed Vitals:   05/13/14 1548  BP: 126/78  Pulse: 93  Temp: 98.9 F (37.2 C)  TempSrc: Oral  Resp: 17  Height: 5' 1.5" (1.562 m)  Weight: 159 lb (72.122 kg)  SpO2: 100%          Assessment & Plan:   [name] is a 69 y.o. female Acute right otitis media, recurrence not specified, unspecified otitis media type - Plan: amoxicillin (AMOXIL) 875 MG tablet  Acute upper respiratory infection  Suspected viral URI, now with early R AOM. Start amoxicillin, sx care with mucinex and saline NS and rtc precautions discussed. Reassuring vital signs. Keep follow up with D. Balan for DM2.   Meds ordered this encounter  Medications  . amoxicillin (AMOXIL) 875 MG tablet    Sig: Take 1 tablet (875 mg total) by mouth 2 (two) times daily.    Dispense:  20 tablet    Refill:  0   Patient Instructions  Saline nasal spray atleast 4 times per day, over the counter mucinex or mucinex DM, drink plenty of fluids.  Return to clinic if worsening. Amoxicillin for early right ear infection.  Tylenol if needed for body aches or earache.    Return to the clinic or go to the nearest emergency room if any of your symptoms worsen or new symptoms occur.  Upper Respiratory Infection, Adult An upper respiratory infection (URI) is also sometimes known as the common cold. The upper respiratory tract includes the nose, sinuses, throat, trachea, and bronchi. Bronchi are the airways leading to the lungs. Most people improve within 1 week, but symptoms can last up to 2 weeks. A residual cough may last even longer.  CAUSES Many different viruses can infect the tissues lining the upper respiratory tract. The tissues become irritated and inflamed and often become very moist. Mucus production is also common. A cold is contagious. You can easily spread the virus to others by oral contact. This includes kissing, sharing a Rashad, coughing, or sneezing. Touching your mouth or nose and then touching a surface, which is then touched by another person, can also spread the virus. SYMPTOMS  Symptoms typically develop 1 to 3 days after you come in contact with a cold virus. Symptoms vary from person to person. They may include:  Runny nose.  Sneezing.  Nasal congestion.  Sinus irritation.  Sore throat.  Loss of voice (laryngitis).  Cough.  Fatigue.  Muscle aches.  Loss of appetite.  Headache.  Low-grade fever. DIAGNOSIS  You might diagnose your own cold based on familiar symptoms, since most people get a cold 2 to 3 times a year. Your caregiver can confirm this based on your exam. Most importantly, your caregiver can check that your symptoms are not due to another disease such as strep throat, sinusitis, pneumonia, asthma, or epiglottitis. Blood tests, throat tests, and X-rays are not necessary to diagnose a common cold, but they may sometimes be helpful in excluding other more serious diseases. Your caregiver will decide if any further tests are required. RISKS AND COMPLICATIONS  You may be at risk for a more severe case of the common cold  if you smoke cigarettes, have chronic heart disease (such as heart failure) or lung disease (such as asthma), or if you have a weakened immune system. The very young and very old are also at risk for more serious infections. Bacterial sinusitis, middle ear infections, and bacterial pneumonia can complicate the common cold. The common cold can worsen asthma and chronic obstructive pulmonary disease (COPD). Sometimes, these complications can require emergency medical care and may be life-threatening. PREVENTION  The best way to protect against getting a cold is to practice good hygiene. Avoid oral or hand contact with people with cold symptoms. Wash your hands often if contact occurs. There is no clear evidence that vitamin C, vitamin E, echinacea, or exercise reduces the chance of developing a cold. However, it is always recommended to get plenty of rest and  practice good nutrition. TREATMENT  Treatment is directed at relieving symptoms. There is no cure. Antibiotics are not effective, because the infection is caused by a virus, not by bacteria. Treatment may include:  Increased fluid intake. Sports drinks offer valuable electrolytes, sugars, and fluids.  Breathing heated mist or steam (vaporizer or shower).  Eating chicken soup or other clear broths, and maintaining good nutrition.  Getting plenty of rest.  Using gargles or lozenges for comfort.  Controlling fevers with ibuprofen or acetaminophen as directed by your caregiver.  Increasing usage of your inhaler if you have asthma. Zinc gel and zinc lozenges, taken in the first 24 hours of the common cold, can shorten the duration and lessen the severity of symptoms. Pain medicines may help with fever, muscle aches, and throat pain. A variety of non-prescription medicines are available to treat congestion and runny nose. Your caregiver can make recommendations and may suggest nasal or lung inhalers for other symptoms.  HOME CARE INSTRUCTIONS    Only take over-the-counter or prescription medicines for pain, discomfort, or fever as directed by your caregiver.  Use a warm mist humidifier or inhale steam from a shower to increase air moisture. This may keep secretions moist and make it easier to breathe.  Drink enough water and fluids to keep your urine clear or pale yellow.  Rest as needed.  Return to work when your temperature has returned to normal or as your caregiver advises. You may need to stay home longer to avoid infecting others. You can also use a face mask and careful hand washing to prevent spread of the virus. SEEK MEDICAL CARE IF:   After the first few days, you feel you are getting worse rather than better.  You need your caregiver's advice about medicines to control symptoms.  You develop chills, worsening shortness of breath, or brown or red sputum. These may be signs of pneumonia.  You develop yellow or brown nasal discharge or pain in the face, especially when you bend forward. These may be signs of sinusitis.  You develop a fever, swollen neck glands, pain with swallowing, or white areas in the back of your throat. These may be signs of strep throat. SEEK IMMEDIATE MEDICAL CARE IF:   You have a fever.  You develop severe or persistent headache, ear pain, sinus pain, or chest pain.  You develop wheezing, a prolonged cough, cough up blood, or have a change in your usual mucus (if you have chronic lung disease).  You develop sore muscles or a stiff neck. Document Released: 10/18/2000 Document Revised: 07/17/2011 Document Reviewed: 07/30/2013 [facility] Patient Information 2015 [place], [place]. This information is not intended to replace advice given to you by your health care provider. Make sure you discuss any questions you have with your health care provider.   Otitis Media Otitis media is redness, soreness, and inflammation of the middle ear. Otitis media may be caused by allergies or, most commonly, by  infection. Often it occurs as a complication of the common cold. SIGNS AND SYMPTOMS Symptoms of otitis media may include:  Earache.  Fever.  Ringing in your ear.  Headache.  Leakage of fluid from the ear. DIAGNOSIS To diagnose otitis media, your health care provider will examine your ear with an otoscope. This is an instrument that allows your health care provider to see into your ear in order to examine your eardrum. Your health care provider also will ask you questions about your symptoms. TREATMENT  Typically, otitis media resolves on  its own within 3-5 days. Your health care provider may prescribe medicine to ease your symptoms of pain. If otitis media does not resolve within 5 days or is recurrent, your health care provider may prescribe antibiotic medicines if he or she suspects that a bacterial infection is the cause. HOME CARE INSTRUCTIONS   If you were prescribed an antibiotic medicine, finish it all even if you start to feel better.  Take medicines only as directed by your health care provider.  Keep all follow-up visits as directed by your health care provider. SEEK MEDICAL CARE IF:  You have otitis media only in one ear, or bleeding from your nose, or both.  You notice a lump on your neck.  You are not getting better in 3-5 days.  You feel worse instead of better. SEEK IMMEDIATE MEDICAL CARE IF:   You have pain that is not controlled with medicine.  You have swelling, redness, or pain around your ear or stiffness in your neck.  You notice that part of your face is paralyzed.  You notice that the bone behind your ear (mastoid) is tender when you touch it. MAKE SURE YOU:   Understand these instructions.  Will watch your condition.  Will get help right away if you are not doing well or get worse. Document Released: 01/28/2004 Document Revised: 09/08/2013 Document Reviewed: 11/19/2012 [facility] Patient Information 2015 [place], [place]. This information is not  intended to replace advice given to you by your health care provider. Make sure you discuss any questions you have with your health care provider.     I personally performed the services described in this documentation, which was scribed in my presence. The recorded information has been reviewed and considered, and addended by me as needed.

## 2014-05-13 NOTE — Patient Instructions (Signed)
Saline nasal spray atleast 4 times per day, over the counter mucinex or mucinex DM, drink plenty of fluids.  Return to clinic if worsening. Amoxicillin for early right ear infection.  Tylenol if needed for body aches or earache.  Return to the clinic or go to the nearest emergency room if any of your symptoms worsen or new symptoms occur.  Upper Respiratory Infection, Adult An upper respiratory infection (URI) is also sometimes known as the common cold. The upper respiratory tract includes the nose, sinuses, throat, trachea, and bronchi. Bronchi are the airways leading to the lungs. Most people improve within 1 week, but symptoms can last up to 2 weeks. A residual cough may last even longer.  CAUSES Many different viruses can infect the tissues lining the upper respiratory tract. The tissues become irritated and inflamed and often become very moist. Mucus production is also common. A cold is contagious. You can easily spread the virus to others by oral contact. This includes kissing, sharing a Wild, coughing, or sneezing. Touching your mouth or nose and then touching a surface, which is then touched by another person, can also spread the virus. SYMPTOMS  Symptoms typically develop 1 to 3 days after you come in contact with a cold virus. Symptoms vary from person to person. They may include:  Runny nose.  Sneezing.  Nasal congestion.  Sinus irritation.  Sore throat.  Loss of voice (laryngitis).  Cough.  Fatigue.  Muscle aches.  Loss of appetite.  Headache.  Low-grade fever. DIAGNOSIS  You might diagnose your own cold based on familiar symptoms, since most people get a cold 2 to 3 times a year. Your caregiver can confirm this based on your exam. Most importantly, your caregiver can check that your symptoms are not due to another disease such as strep throat, sinusitis, pneumonia, asthma, or epiglottitis. Blood tests, throat tests, and X-rays are not necessary to diagnose a common  cold, but they may sometimes be helpful in excluding other more serious diseases. Your caregiver will decide if any further tests are required. RISKS AND COMPLICATIONS  You may be at risk for a more severe case of the common cold if you smoke cigarettes, have chronic heart disease (such as heart failure) or lung disease (such as asthma), or if you have a weakened immune system. The very young and very old are also at risk for more serious infections. Bacterial sinusitis, middle ear infections, and bacterial pneumonia can complicate the common cold. The common cold can worsen asthma and chronic obstructive pulmonary disease (COPD). Sometimes, these complications can require emergency medical care and may be life-threatening. PREVENTION  The best way to protect against getting a cold is to practice good hygiene. Avoid oral or hand contact with people with cold symptoms. Wash your hands often if contact occurs. There is no clear evidence that vitamin C, vitamin E, echinacea, or exercise reduces the chance of developing a cold. However, it is always recommended to get plenty of rest and practice good nutrition. TREATMENT  Treatment is directed at relieving symptoms. There is no cure. Antibiotics are not effective, because the infection is caused by a virus, not by bacteria. Treatment may include:  Increased fluid intake. Sports drinks offer valuable electrolytes, sugars, and fluids.  Breathing heated mist or steam (vaporizer or shower).  Eating chicken soup or other clear broths, and maintaining good nutrition.  Getting plenty of rest.  Using gargles or lozenges for comfort.  Controlling fevers with ibuprofen or acetaminophen as directed by  your caregiver.  Increasing usage of your inhaler if you have asthma. Zinc gel and zinc lozenges, taken in the first 24 hours of the common cold, can shorten the duration and lessen the severity of symptoms. Pain medicines may help with fever, muscle aches, and  throat pain. A variety of non-prescription medicines are available to treat congestion and runny nose. Your caregiver can make recommendations and may suggest nasal or lung inhalers for other symptoms.  HOME CARE INSTRUCTIONS   Only take over-the-counter or prescription medicines for pain, discomfort, or fever as directed by your caregiver.  Use a warm mist humidifier or inhale steam from a shower to increase air moisture. This may keep secretions moist and make it easier to breathe.  Drink enough water and fluids to keep your urine clear or pale yellow.  Rest as needed.  Return to work when your temperature has returned to normal or as your caregiver advises. You may need to stay home longer to avoid infecting others. You can also use a face mask and careful hand washing to prevent spread of the virus. SEEK MEDICAL CARE IF:   After the first few days, you feel you are getting worse rather than better.  You need your caregiver's advice about medicines to control symptoms.  You develop chills, worsening shortness of breath, or brown or red sputum. These may be signs of pneumonia.  You develop yellow or brown nasal discharge or pain in the face, especially when you bend forward. These may be signs of sinusitis.  You develop a fever, swollen neck glands, pain with swallowing, or white areas in the back of your throat. These may be signs of strep throat. SEEK IMMEDIATE MEDICAL CARE IF:   You have a fever.  You develop severe or persistent headache, ear pain, sinus pain, or chest pain.  You develop wheezing, a prolonged cough, cough up blood, or have a change in your usual mucus (if you have chronic lung disease).  You develop sore muscles or a stiff neck. Document Released: 10/18/2000 Document Revised: 07/17/2011 Document Reviewed: 07/30/2013 [facility] Patient Information 2015 [place], [place]. This information is not intended to replace advice given to you by your health care provider.  Make sure you discuss any questions you have with your health care provider.   Otitis Media Otitis media is redness, soreness, and inflammation of the middle ear. Otitis media may be caused by allergies or, most commonly, by infection. Often it occurs as a complication of the common cold. SIGNS AND SYMPTOMS Symptoms of otitis media may include:  Earache.  Fever.  Ringing in your ear.  Headache.  Leakage of fluid from the ear. DIAGNOSIS To diagnose otitis media, your health care provider will examine your ear with an otoscope. This is an instrument that allows your health care provider to see into your ear in order to examine your eardrum. Your health care provider also will ask you questions about your symptoms. TREATMENT  Typically, otitis media resolves on its own within 3-5 days. Your health care provider may prescribe medicine to ease your symptoms of pain. If otitis media does not resolve within 5 days or is recurrent, your health care provider may prescribe antibiotic medicines if he or she suspects that a bacterial infection is the cause. HOME CARE INSTRUCTIONS   If you were prescribed an antibiotic medicine, finish it all even if you start to feel better.  Take medicines only as directed by your health care provider.  Keep all follow-up visits  as directed by your health care provider. SEEK MEDICAL CARE IF:  You have otitis media only in one ear, or bleeding from your nose, or both.  You notice a lump on your neck.  You are not getting better in 3-5 days.  You feel worse instead of better. SEEK IMMEDIATE MEDICAL CARE IF:   You have pain that is not controlled with medicine.  You have swelling, redness, or pain around your ear or stiffness in your neck.  You notice that part of your face is paralyzed.  You notice that the bone behind your ear (mastoid) is tender when you touch it. MAKE SURE YOU:   Understand these instructions.  Will watch your  condition.  Will get help right away if you are not doing well or get worse. Document Released: 01/28/2004 Document Revised: 09/08/2013 Document Reviewed: 11/19/2012 [facility] Patient Information 2015 [place]. This information is not intended to replace advice given to you by your health care provider. Make sure you discuss any questions you have with your health care provider.

## 2014-06-29 ENCOUNTER — Other Ambulatory Visit: Payer: Self-pay

## 2014-06-29 MED ORDER — METOPROLOL TARTRATE 25 MG PO TABS: 25.0000 mg | ORAL_TABLET | Freq: Two times a day (BID) | ORAL | Status: DC

## 2014-06-29 MED ORDER — FUROSEMIDE 40 MG PO TABS: 40.0000 mg | ORAL_TABLET | Freq: Every day | ORAL | Status: DC

## 2014-06-29 NOTE — Telephone Encounter (Signed)
Rx(s) sent to pharmacy electronically.  

## 2014-11-02 ENCOUNTER — Other Ambulatory Visit: Payer: Self-pay | Admitting: Internal Medicine

## 2014-11-02 NOTE — Telephone Encounter (Signed)
Rx(s) sent to pharmacy electronically.  

## 2014-11-05 ENCOUNTER — Other Ambulatory Visit: Payer: Self-pay | Admitting: *Deleted

## 2014-11-05 MED ORDER — LISINOPRIL 20 MG PO TABS: 20.0000 mg | ORAL_TABLET | Freq: Every day | ORAL | Status: DC

## 2014-11-06 ENCOUNTER — Other Ambulatory Visit: Payer: Self-pay | Admitting: *Deleted

## 2014-11-06 MED ORDER — LISINOPRIL 20 MG PO TABS: 20.0000 mg | ORAL_TABLET | Freq: Every day | ORAL | Status: DC

## 2014-11-07 ENCOUNTER — Other Ambulatory Visit: Payer: Self-pay | Admitting: Internal Medicine

## 2014-11-10 NOTE — Telephone Encounter (Signed)
REFILL 

## 2015-02-05 ENCOUNTER — Encounter: Payer: Self-pay | Admitting: Podiatry

## 2015-02-05 ENCOUNTER — Ambulatory Visit (INDEPENDENT_AMBULATORY_CARE_PROVIDER_SITE_OTHER): Payer: 59

## 2015-02-05 ENCOUNTER — Ambulatory Visit (INDEPENDENT_AMBULATORY_CARE_PROVIDER_SITE_OTHER): Payer: 59 | Admitting: Podiatry

## 2015-02-05 VITALS — BP 140/60 | HR 68 | Resp 12

## 2015-02-05 DIAGNOSIS — S92911A Unspecified fracture of right toe(s), initial encounter for closed fracture: Secondary | ICD-10-CM | POA: Diagnosis not present

## 2015-02-05 DIAGNOSIS — L0390 Cellulitis, unspecified: Secondary | ICD-10-CM | POA: Diagnosis not present

## 2015-02-05 DIAGNOSIS — R52 Pain, unspecified: Secondary | ICD-10-CM

## 2015-02-05 MED ORDER — CEPHALEXIN 500 MG PO CAPS: 500.0000 mg | ORAL_CAPSULE | Freq: Three times a day (TID) | ORAL | Status: DC

## 2015-02-05 NOTE — Progress Notes (Signed)
   Subjective:    Patient ID: [name], female    DOB: Oct 03, 1945, 69 y.o.   MRN: [id number]  HPI  69 year old female presents the operative concerns her right big toe pain which is been ongoing for approximate one month. She states that she injured her right big toe after a fall and she got her toenail, on a rug. Since then she has had pain to her right big toe as well as swelling and redness which has remained about the same. She states it is not worsened or gotten better. She states that the toenail did somewhat falloff. She denies any drainage or purulence from around the toenail. She didn't get a callus along the toenail for which she trims off. She states that when she trims the calluses the area bleeds underneath it. She's had no previous treatment. No other complaints at this time.  She is diabetic and states her blood sugar has been high   Review of Systems       Objective:   Physical Exam AAO 3, NAD DP pulses 2/4, PT pulse 1/4 bilaterally, CRT less than 3 seconds Protective sensation decreased with Simms Weinstein monofilament, decreased vibratory sensation. The right hallux is swollen to the level of the MPJ as well as erythematous extending from the proximal nail border proximally just distal to the MPJ. There is no drainage or purulence expressed from around the toenail. The toenail does appear to be thin and peeling from the underlying nail bed particularly along the nail borders. There is a small hyperkeratotic lesion submetatarsal one. There is no tenderness to palpation overlying the hallux although she does have neuropathy. No other areas of edema, erythema, increased warmth to bilateral lower extremity is. No other open lesions or pre-ulcerative lesions. No pain with calf compression, swelling, warmth, erythema.    Assessment & Plan:  69 year old female right hallux fracture, erythema likely due to localized infection versus inflammation -X-rays were obtained and  reviewed with the patient. There does appear to be a fracture the distal phalanx -Treatment options discussed including all alternatives, risks, and complications -Surgical shoe for mobilization -Hallux nail was debrided without any competitions per the nail borders were debrided. There is dried blood around the nail however there is no purulence expressed. Discussed nail avulsion however she wishes to hold off on that at this time. Remaining nail piercing firmly adhered to the nail bed and there is no purulence expressed at this time. -Prescribed Keflex. -Will obtain arterial studies due to decreased pulses -Follow-up in 1 week or sooner if any problems arise. In the meantime, encouraged to call the office with any questions, concerns, change in symptoms.   Ovid Curd, DPM

## 2015-02-08 ENCOUNTER — Telehealth: Payer: Self-pay | Admitting: *Deleted

## 2015-02-08 DIAGNOSIS — R0989 Other specified symptoms and signs involving the circulatory and respiratory systems: Secondary | ICD-10-CM

## 2015-02-08 NOTE — Telephone Encounter (Addendum)
-----   Message from Vivi Barrack, DPM sent at 02/05/2015  6:32 PM EDT ----- Can you please order arterial studies for decreased pulses? Thanks.  Dr. Gabriel Rung orders for arterial dopplers faxed to [facility].  Left message informing pt Dr. Ardelle Anton had test for her.  Left message to call for information concerning testing.  Pt called and I informed of orders with [facility] and appt line number.

## 2015-02-10 ENCOUNTER — Ambulatory Visit (HOSPITAL_COMMUNITY)
Admission: RE | Admit: 2015-02-10 | Discharge: 2015-02-10 | Disposition: A | Discharge status: Home / Self Care | Payer: 59 | Source: Ambulatory Visit | Attending: Cardiovascular Disease | Admitting: Cardiovascular Disease

## 2015-02-10 DIAGNOSIS — I10 Essential (primary) hypertension: Secondary | ICD-10-CM | POA: Insufficient documentation

## 2015-02-10 DIAGNOSIS — I2510 Atherosclerotic heart disease of native coronary artery without angina pectoris: Secondary | ICD-10-CM | POA: Insufficient documentation

## 2015-02-10 DIAGNOSIS — R0989 Other specified symptoms and signs involving the circulatory and respiratory systems: Secondary | ICD-10-CM | POA: Diagnosis not present

## 2015-02-10 DIAGNOSIS — E119 Type 2 diabetes mellitus without complications: Secondary | ICD-10-CM | POA: Insufficient documentation

## 2015-02-12 ENCOUNTER — Encounter: Payer: Self-pay | Admitting: Podiatry

## 2015-02-12 ENCOUNTER — Ambulatory Visit (INDEPENDENT_AMBULATORY_CARE_PROVIDER_SITE_OTHER): Payer: 59 | Admitting: Podiatry

## 2015-02-12 VITALS — BP 134/61 | HR 68 | Resp 18

## 2015-02-12 DIAGNOSIS — S92911A Unspecified fracture of right toe(s), initial encounter for closed fracture: Secondary | ICD-10-CM

## 2015-02-12 DIAGNOSIS — L0390 Cellulitis, unspecified: Secondary | ICD-10-CM

## 2015-02-12 NOTE — Progress Notes (Signed)
Patient ID: [name], female   DOB: 12/19/1945, 68 y.o.   MRN: [id number]  Subjective: 69 year old female presents the office if up evaluation of right hallux fracture as well as infection. She states that she continues to take the antibiotics as directed. She is soaking her foot in Epson salt soaks. She is currently with antibiotic ointment and a bandage. She states of the redness and the swelling has decreased. She is notany drainage or purulence coming from the toe. Denies any red streaks. No malodor. She denies any systemic complaints as fevers, chills, nausea, vomiting. No calf pain, chest pain, shortness of breath. No other complaints at this time. No acute changes.  Objective: AAO 3, NAD; presents in surgical shoe Neurovascular status unchanged There is mild edema to the right hallux from the distal aspect just proximal to the IPJ. There is no swelling past the MPJ. There is a faint amount of erythema around the distal aspect of the hallux although the erythema appears significantly improved. There is no drainage or purulence. There is no tenderness to palpation overlying the area. The remaining nail is coming here to the underlying nail bed. No other open lesions or pre-ulcerative lesions. No pain with calf compression, swelling, warmth, erythema. No other areas of tenderness to bilateral lower extremities.    Assessment: Resolving infection right hallux   Plan: -Treatment options discussed including all alternatives, risks, and complications -Nail bed debrided without complications. Finish course of antibiotics. Continue epsom salt soaks. Continue surgical shoe.  -Monitor for any clinical signs or symptoms of infection and directed to call the office immediately should any occur or go to the ER. -Follow-up in 2 weeks or sooner if any problems arise. In the meantime, encouraged to call the office with any questions, concerns, change in symptoms.   Ovid Curd, DPM

## 2015-02-12 NOTE — Patient Instructions (Signed)
Continue soaking in epsom salts twice a day followed by antibiotic ointment and a band-aid. Can leave uncovered at night. Continue this until completely healed.  Monitor for any signs/symptoms of infection. Call the office immediately if any occur or go directly to the emergency room. Call with any questions/concerns.  

## 2015-02-23 ENCOUNTER — Telehealth: Payer: Self-pay | Admitting: *Deleted

## 2015-02-23 NOTE — Telephone Encounter (Signed)
Dr. Ardelle AntonWagoner reviewed pt's dopplers of 02/11/2015 and states pt has an appt to see him on 02/26/2015, and will discuss follow up with cardiologist for arrhythmia.  Left message with pt's husband stating Dr. Ardelle AntonWagoner will discuss her results at next appt.

## 2015-02-26 ENCOUNTER — Ambulatory Visit: Payer: 59 | Admitting: Podiatry

## 2015-03-22 ENCOUNTER — Other Ambulatory Visit: Payer: Self-pay | Admitting: Internal Medicine

## 2015-07-13 NOTE — Progress Notes (Signed)
Cardiology Office Note:    Date:  07/14/2015   ID:  [name], DOB Jun 27, 1945, MRN [id number]  PCP:  Tara Frames, MD  Cardiologist:  Dr. K. Italy Hilty   Electrophysiologist:  n/a  Chief Complaint  Patient presents with  . Coronary Artery Disease    Follow up    History of Present Illness:     [name] is a 70 y.o. female with a hx of CAD s/p CABG in 10/08, DM2, HL, HTN, Hypothyroidism.  Last seen by Dr. Rennis Golden 12/15.    Returns for FU.     Past Medical History  Diagnosis Date  . Coronary artery disease 02/2007    CABG  . HTN (hypertension)   . Diabetes (HCC)   . PAF (paroxysmal atrial fibrillation) (HCC)     post op  . Hypothyroid   . Cholelithiases   . Dyslipidemia   . S/P CABG x 4 2008    Past Surgical History  Procedure Laterality Date  . Incontinence surgery  2009  . Transthoracic echocardiogram  07/2011    EF 45-50%, mild septal hypokinesis, mild inferior wall hypokinesis; RV systolc function mildly reduced; borderline LA enlargement; mild MR & TR; AV mildly sclerotic; mild pulm valve regurg   . Nm myocar perf wall motion  07/2011    bruce myoview - mild perfusion defect in basal inferolateral & mid inferolateral regions (attenuation artifact, but prior infarct annote be excluded)' EF 49%; low risk  . Cardiac catheterization  03/01/2007    LAD with 99% prox lesion, at birfucation of Cfx there is 99% lesion, at bifurcation of large OM1 there is 99% lesion, 70% prox lesion at ramus intermedius, 90% lesion in prox RCA (Dr. Claudia Desanctis) - susequent CABG  . Coronary artery bypass graft  03/05/2007    LIMA-LAD, SVG-ramus intermediate. SVG-OM, SVG-PDA (Dr. Donata Clay)  . Abdominal hysterectomy      Current Medications: Outpatient Prescriptions Prior to Visit  Medication Sig Dispense Refill  . aspirin 81 MG tablet Take 81 mg by mouth daily.    Marland Kitchen BIOTIN FORTE PO Take by mouth 2 (two) times daily.    . Calcium-Vitamin D (CALTRATE 600 PLUS-VIT D PO) Take by  mouth.    . cephALEXin (KEFLEX) 500 MG capsule Take 1 capsule (500 mg total) by mouth 3 (three) times daily. 30 capsule 2  . cholecalciferol (VITAMIN D) 1000 UNITS tablet Take 1,000 Units by mouth daily.    Marland Kitchen co-enzyme Q-10 30 MG capsule Take 30 mg by mouth 3 (three) times daily.    . furosemide (LASIX) 40 MG tablet Take 1 tablet (40 mg total) by mouth daily. 90 tablet 3  . insulin lispro protamine-insulin lispro (HUMALOG 50/50) (50-50) 100 UNIT/ML SUSP Inject into the skin 2 (two) times daily before a meal. Sliding scale    . insulin regular (NOVOLIN R,HUMULIN R) 100 units/mL injection Inject 50 Units into the skin 2 (two) times daily before a meal.    . levothyroxine (SYNTHROID, LEVOTHROID) 50 MCG tablet Take 50 mcg by mouth daily.    Marland Kitchen lisinopril (PRINIVIL,ZESTRIL) 20 MG tablet TAKE 1 TABLET BY MOUTH TWICE A DAY (MUST SCHEDULE APPOINTMENT FOR FURTHER REFILLS) 180 tablet 1  . metoprolol tartrate (LOPRESSOR) 25 MG tablet Take 1 tablet (25 mg total) by mouth 2 (two) times daily. 180 tablet 3  . simvastatin (ZOCOR) 10 MG tablet Take 1 tablet (10 mg total) by mouth at bedtime. 90 tablet 3  . venlafaxine XR (EFFEXOR-XR) 150 MG  24 hr capsule Take 150 mg by mouth daily.    . vitamin C (ASCORBIC ACID) 500 MG tablet Take 500 mg by mouth daily.     No facility-administered medications prior to visit.     Allergies:   Crestor and Lipitor   Social History   Social History  . Marital Status: Married    Spouse Name: N/A  . Number of Children: 1  . Years of Education: 13   Occupational History  .  [employer]   Social History Main Topics  . Smoking status: Never Smoker   . Smokeless tobacco: Not on file  . Alcohol Use: No  . Drug Use: No  . Sexual Activity: No   Other Topics Concern  . Not on file   Social History Narrative     Family History:  The patient's family history includes Cancer in her father, maternal grandmother, and paternal grandmother; Diabetes in her child;  Diabetes (age of onset: 15) in her mother; Heart attack (age of onset: 67) in her father; Heart disease in her mother; Hyperlipidemia in her mother; Thyroid disease in her sister.   ROS:   Please see the history of present illness.    ROS All other systems reviewed and are negative.   Physical Exam:    VS:  There were no vitals taken for this visit.   GEN: Well nourished, well developed, in no acute distress HEENT: normal Neck: no JVD, no masses Cardiac: Normal S1/S2, RRR; no murmurs, rubs, or gallops, no edema;   carotid bruits,   Respiratory:  clear to auscultation bilaterally; no wheezing, rhonchi or rales GI: soft, nontender, nondistended, + BS MS: no deformity or atrophy Skin: warm and dry, no rash Neuro:  Bilateral strength equal, no focal deficits  Psych: Alert and oriented x 3, normal affect  Wt Readings from Last 3 Encounters:  05/13/14 159 lb (72.122 kg)  04/15/14 161 lb 12.8 oz (73.392 kg)  12/17/12 161 lb 12.8 oz (73.392 kg)      Studies/Labs Reviewed:     EKG:  EKG is  ordered today.  The ekg ordered today demonstrates   Recent Labs: No results found for requested labs within last 365 days.   Recent Lipid Panel    Component Value Date/Time   CHOL 117 12/17/2012 1014   TRIG 297* 12/17/2012 1014   HDL 38* 12/17/2012 1014   CHOLHDL 3.1 12/17/2012 1014   VLDL 59* 12/17/2012 1014   LDLCALC 20 12/17/2012 1014    Additional studies/ records that were reviewed today include:    LE Arterial US 10/16 No evidence of segmental lower extremity arterial disease at rest, bilaterally. Normal ABI's, bilaterally. Normal great toe pressures, bilaterally.  Myoview 3/13 No sig ischemia, EF 49%; Low Risk  Echo 3/13 EF 45%, impaired LV relaxation, mild septal and inf HK, borderline LAE  LHC 10/08 3v CAD >> CABG  ASSESSMENT:     1. Coronary artery disease involving native coronary artery of native heart without angina pectoris   2. Essential hypertension   3.  Dyslipidemia   4. 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 CAD -   2. HTN -   3. HL -   4. DM2 -    Medication Adjustments/Labs and Tests Ordered: Current medicines are reviewed at length with the patient today.  Concerns regarding medicines are outlined above.  Medication changes,  Labs and Tests ordered today are outlined in the Patient Instructions noted below. There are no Patient Instructions on file for this visit. Signed, Tereso NewcomerScott Kenyatte Chatmon, PA-C  07/14/2015 9:56 AM    [facility] [address], [place], [place]  [address] Phone: [phone]; Fax: [phone]     This encounter was created in error - please disregard.

## 2015-07-14 ENCOUNTER — Encounter: Payer: 59 | Admitting: Physician Assistant

## 2015-07-16 ENCOUNTER — Encounter: Payer: 59 | Admitting: Podiatry

## 2015-07-19 NOTE — Progress Notes (Signed)
No show

## 2015-07-25 ENCOUNTER — Encounter (HOSPITAL_COMMUNITY): Payer: Self-pay

## 2015-07-25 ENCOUNTER — Emergency Department (HOSPITAL_COMMUNITY)
Admission: EM | Admit: 2015-07-25 | Discharge: 2015-07-26 | Disposition: A | Discharge status: Home / Self Care | Payer: 59 | Attending: Emergency Medicine | Admitting: Emergency Medicine

## 2015-07-25 DIAGNOSIS — Z792 Long term (current) use of antibiotics: Secondary | ICD-10-CM | POA: Diagnosis not present

## 2015-07-25 DIAGNOSIS — E785 Hyperlipidemia, unspecified: Secondary | ICD-10-CM | POA: Diagnosis not present

## 2015-07-25 DIAGNOSIS — Z7982 Long term (current) use of aspirin: Secondary | ICD-10-CM | POA: Diagnosis not present

## 2015-07-25 DIAGNOSIS — E162 Hypoglycemia, unspecified: Secondary | ICD-10-CM

## 2015-07-25 DIAGNOSIS — I10 Essential (primary) hypertension: Secondary | ICD-10-CM | POA: Diagnosis not present

## 2015-07-25 DIAGNOSIS — E039 Hypothyroidism, unspecified: Secondary | ICD-10-CM | POA: Insufficient documentation

## 2015-07-25 DIAGNOSIS — I499 Cardiac arrhythmia, unspecified: Secondary | ICD-10-CM | POA: Insufficient documentation

## 2015-07-25 DIAGNOSIS — I2510 Atherosclerotic heart disease of native coronary artery without angina pectoris: Secondary | ICD-10-CM | POA: Diagnosis not present

## 2015-07-25 DIAGNOSIS — Z8719 Personal history of other diseases of the digestive system: Secondary | ICD-10-CM | POA: Diagnosis not present

## 2015-07-25 DIAGNOSIS — Z951 Presence of aortocoronary bypass graft: Secondary | ICD-10-CM | POA: Diagnosis not present

## 2015-07-25 DIAGNOSIS — Z794 Long term (current) use of insulin: Secondary | ICD-10-CM | POA: Diagnosis not present

## 2015-07-25 DIAGNOSIS — Z9889 Other specified postprocedural states: Secondary | ICD-10-CM | POA: Insufficient documentation

## 2015-07-25 DIAGNOSIS — Z79899 Other long term (current) drug therapy: Secondary | ICD-10-CM | POA: Insufficient documentation

## 2015-07-25 DIAGNOSIS — E11649 Type 2 diabetes mellitus with hypoglycemia without coma: Secondary | ICD-10-CM | POA: Diagnosis not present

## 2015-07-25 DIAGNOSIS — I480 Paroxysmal atrial fibrillation: Secondary | ICD-10-CM | POA: Diagnosis not present

## 2015-07-25 LAB — CBG MONITORING, ED
Glucose-Capillary: 111 mg/dL — ABNORMAL HIGH (ref 65–99)
Glucose-Capillary: 144 mg/dL — ABNORMAL HIGH (ref 65–99)

## 2015-07-25 MED ORDER — SODIUM CHLORIDE 0.9 % IV SOLN
INTRAVENOUS | Status: DC
Start: 2015-07-25 — End: 2015-07-26
  Administered 2015-07-25: 23:00:00 via INTRAVENOUS

## 2015-07-25 NOTE — ED Notes (Signed)
CBG 111 

## 2015-07-25 NOTE — ED Notes (Signed)
Per EMS, Pt found unresponsive by husband, upon arrival CBG 42, 25g of d50 given by EMS with of fluid, CBG up to 303 and at 172 just prior to arrival. 12 lead showed depression in v4, v5, v6. 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 Pt alert and oriented x 4 upon arrival to ED. EMS VSS

## 2015-07-25 NOTE — ED Provider Notes (Signed)
CSN: [id number]     Arrival date & time 07/25/15  2207 History   First MD Initiated Contact with Patient 07/25/15 2212      HPI Patient presents to the emergency room for hypoglycemia. Patient has a history of diabetes. She has been cutting back on her meals trying to lose weight. The patient's blood sugar was 70 this morning. She took her insulin like she usually does but did not eat as much as she normally would. This evening her husband found her unresponsive. They called 911. She was given an amp of D50 and her blood sugar increased up to 303.  The patient denies any trouble with any chest pain or shortness of breath. No abdominal pain. No vomiting or diarrhea. Right now she is feeling well without complaints. Past Medical History  Diagnosis Date  . Coronary artery disease 02/2007    CABG  . HTN (hypertension)   . Diabetes (HCC)   . PAF (paroxysmal atrial fibrillation) (HCC)     post op  . Hypothyroid   . Cholelithiases   . Dyslipidemia   . S/P CABG x 4 2008   Past Surgical History  Procedure Laterality Date  . Incontinence surgery  2009  . Transthoracic echocardiogram  07/2011    EF 45-50%, mild septal hypokinesis, mild inferior wall hypokinesis; RV systolc function mildly reduced; borderline LA enlargement; mild MR & TR; AV mildly sclerotic; mild pulm valve regurg   . Nm myocar perf wall motion  07/2011    bruce myoview - mild perfusion defect in basal inferolateral & mid inferolateral regions (attenuation artifact, but prior infarct annote be excluded)' EF 49%; low risk  . Cardiac catheterization  03/01/2007    LAD with 99% prox lesion, at birfucation of Cfx there is 99% lesion, at bifurcation of large OM1 there is 99% lesion, 70% prox lesion at ramus intermedius, 90% lesion in prox RCA (Dr. Claudia Desanctis) - susequent CABG  . Coronary artery bypass graft  03/05/2007    LIMA-LAD, SVG-ramus intermediate. SVG-OM, SVG-PDA (Dr. Donata Clay)  . Abdominal hysterectomy     Family History   Problem Relation Age of Onset  . Diabetes Mother 5  . Heart disease Mother   . Hyperlipidemia Mother   . Cancer Father   . Heart attack Father 45  . Cancer Maternal Grandmother   . Cancer Paternal Grandmother   . Thyroid disease Sister   . Diabetes Child    Social History  Substance Use Topics  . Smoking status: Never Smoker   . Smokeless tobacco: None  . Alcohol Use: No   OB History    No data available     Review of Systems  All other systems reviewed and are negative.     Allergies  Crestor and Lipitor  Home Medications   Prior to Admission medications   Medication Sig Start Date End Date Taking? Authorizing Provider  aspirin 81 MG tablet Take 81 mg by mouth daily.    Historical Provider, MD  BIOTIN FORTE PO Take by mouth 2 (two) times daily.    Historical Provider, MD  Calcium-Vitamin D (CALTRATE 600 PLUS-VIT D PO) Take by mouth.    Historical Provider, MD  cephALEXin (KEFLEX) 500 MG capsule Take 1 capsule (500 mg total) by mouth 3 (three) times daily. 02/05/15   Vivi Barrack, DPM  cholecalciferol (VITAMIN D) 1000 UNITS tablet Take 1,000 Units by mouth daily.    Historical Provider, MD  co-enzyme Q-10 30 MG capsule Take 30  mg by mouth 3 (three) times daily.    Historical Provider, MD  furosemide (LASIX) 40 MG tablet Take 1 tablet (40 mg total) by mouth daily. 06/29/14   Chrystie NoseKenneth C Hilty, MD  insulin lispro protamine-insulin lispro (HUMALOG 50/50) (50-50) 100 UNIT/ML SUSP Inject into the skin 2 (two) times daily before a meal. Sliding scale    Historical Provider, MD  insulin regular (NOVOLIN R,HUMULIN R) 100 units/mL injection Inject 50 Units into the skin 2 (two) times daily before a meal.    Historical Provider, MD  levothyroxine (SYNTHROID, LEVOTHROID) 50 MCG tablet Take 50 mcg by mouth daily.    Historical Provider, MD  lisinopril (PRINIVIL,ZESTRIL) 20 MG tablet TAKE 1 TABLET BY MOUTH TWICE A DAY (MUST SCHEDULE APPOINTMENT FOR FURTHER REFILLS) 11/10/14   Chrystie NoseKenneth  C Hilty, MD  metoprolol tartrate (LOPRESSOR) 25 MG tablet Take 1 tablet (25 mg total) by mouth 2 (two) times daily. 06/29/14   Chrystie NoseKenneth C Hilty, MD  simvastatin (ZOCOR) 10 MG tablet Take 1 tablet (10 mg total) by mouth at bedtime. 12/24/12   Chrystie NoseKenneth C Hilty, MD  venlafaxine XR (EFFEXOR-XR) 150 MG 24 hr capsule Take 150 mg by mouth daily.    Historical Provider, MD  vitamin C (ASCORBIC ACID) 500 MG tablet Take 500 mg by mouth daily.    Historical Provider, MD   BP 188/87 mmHg  Pulse 107  Resp 28  Ht 5\' 2"  (1.575 m)  Wt 63.504 kg  BMI 25.60 kg/m2  SpO2 100% Physical Exam  Constitutional: She appears well-developed and well-nourished. No distress.  HENT:  Head: Normocephalic and atraumatic.  Right Ear: External ear normal.  Left Ear: External ear normal.  Eyes: Conjunctivae are normal. Right eye exhibits no discharge. Left eye exhibits no discharge. No scleral icterus.  Neck: Neck supple. No tracheal deviation present.  Cardiovascular: Normal rate, regular rhythm and intact distal pulses.   Pulmonary/Chest: Effort normal and breath sounds normal. No stridor. No respiratory distress. She has no wheezes. She has no rales.  Abdominal: Soft. Bowel sounds are normal. She exhibits no distension. There is no tenderness. There is no rebound and no guarding.  Musculoskeletal: She exhibits no edema or tenderness.  Neurological: She is alert. She has normal strength. No cranial nerve deficit (no facial droop, extraocular movements intact, no slurred speech) or sensory deficit. She exhibits normal muscle tone. She displays no seizure activity. Coordination normal.  Skin: Skin is warm and dry. No rash noted.  Psychiatric: She has a normal mood and affect.  Nursing note and vitals reviewed.   ED Course  Procedures (including critical care time) Labs Review Labs Reviewed  CBG MONITORING, ED - Abnormal; Notable for the following:    Glucose-Capillary 111 (*)    All other components within normal  limits  CBC  BASIC METABOLIC PANEL  I-STAT TROPOININ, ED  CBG MONITORING, ED  CBG MONITORING, ED    Imaging Review No results found. I have personally reviewed and evaluated these images and lab results as part of my medical decision-making.   EKG Interpretation   Date/Time:  Sunday July 25 2015 22:09:07 EDT Ventricular Rate:  82 PR Interval:  151 QRS Duration: 111 QT Interval:  414 QTC Calculation: 483 R Axis:   -42 Text Interpretation:  Sinus arrhythmia Abnormal R-wave progression, late  transition LVH with IVCD, LAD and secondary repol abnrm st depression more  pronounced since previous tracing and changes in aVR are increased  Confirmed by Alfonzia Woolum  MD-J, Jasani Dolney ([id number]) on  07/25/2015 10:28:15 PM      MDM   Final diagnoses:  Hypoglycemia    Patient's initial EKG does show some ST changes however the patient is not having any trouble chest pain or shortness of breath. I'll check cardiac enzymes.  Plan on serial blood glucoses and monitoring while she is here in the emergency room. The patient was given a gram crackers, peanut butter and apple juice.    Linwood Dibbles, MD 07/25/15 [id number]

## 2015-07-26 LAB — I-STAT TROPONIN, ED: Troponin i, poc: 0.01 ng/mL (ref 0.00–0.08)

## 2015-07-26 LAB — CBC
HCT: 36.8 % (ref 36.0–46.0)
HEMOGLOBIN: 11.9 g/dL — AB (ref 12.0–15.0)
MCH: 28.4 pg (ref 26.0–34.0)
MCHC: 32.3 g/dL (ref 30.0–36.0)
MCV: 87.8 fL (ref 78.0–100.0)
Platelets: 236 10*3/uL (ref 150–400)
RBC: 4.19 MIL/uL (ref 3.87–5.11)
RDW: 13.5 % (ref 11.5–15.5)
WBC: 8.3 10*3/uL (ref 4.0–10.5)

## 2015-07-26 LAB — BASIC METABOLIC PANEL
Anion gap: 15 (ref 5–15)
BUN: 11 mg/dL (ref 6–20)
CHLORIDE: 102 mmol/L (ref 101–111)
CO2: 29 mmol/L (ref 22–32)
Calcium: 10.2 mg/dL (ref 8.9–10.3)
Creatinine, Ser: 0.86 mg/dL (ref 0.44–1.00)
GFR calc Af Amer: >60 mL/min (ref >60)
GFR calc non Af Amer: >60 mL/min (ref >60)
GLUCOSE: 161 mg/dL — AB (ref 65–99)
POTASSIUM: 4.4 mmol/L (ref 3.5–5.1)
Sodium: 146 mmol/L — ABNORMAL HIGH (ref 135–145)

## 2015-07-26 LAB — CBG MONITORING, ED: Glucose-Capillary: 127 mg/dL — ABNORMAL HIGH (ref 65–99)

## 2015-07-26 NOTE — Discharge Instructions (Signed)

## 2015-07-26 NOTE — ED Provider Notes (Signed)
Pt improved Repeat glucose appropriate She wants to go home Advised to call her endocrinologist tomorrow Advised to monitor glucose closely Advised to not take insulin if she is not eating and glucose low BP 154/65 mmHg  Pulse 63  Temp(Src) 97.9 F (36.6 C) (Oral)  Resp 21  Ht 5\' 2"  (1.575 m)  Wt 63.504 kg  BMI 25.60 kg/m2  SpO2 97%   Tara Rhineonald Brantly Kalman, MD 07/26/15 0126

## 2015-07-26 NOTE — ED Notes (Signed)
Pt given graham crackers, peanut butter, and orange juice to eat and drink.

## 2015-07-26 NOTE — ED Notes (Signed)
Pt verbalized importance to follow up with pcp today. Pt alert and oriented x 4 and NAD upon d/c

## 2015-07-26 NOTE — ED Notes (Addendum)
CBG 127 

## 2015-08-03 ENCOUNTER — Ambulatory Visit: Payer: 59 | Admitting: Internal Medicine

## 2015-08-10 ENCOUNTER — Other Ambulatory Visit: Payer: Self-pay | Admitting: Internal Medicine

## 2015-08-12 ENCOUNTER — Encounter: Payer: Self-pay | Admitting: Internal Medicine

## 2015-08-12 ENCOUNTER — Ambulatory Visit (INDEPENDENT_AMBULATORY_CARE_PROVIDER_SITE_OTHER): Payer: 59 | Admitting: Internal Medicine

## 2015-08-12 VITALS — BP 94/58 | HR 62 | Ht 62.0 in | Wt 139.0 lb

## 2015-08-12 DIAGNOSIS — I480 Paroxysmal atrial fibrillation: Secondary | ICD-10-CM | POA: Diagnosis not present

## 2015-08-12 DIAGNOSIS — E785 Hyperlipidemia, unspecified: Secondary | ICD-10-CM | POA: Diagnosis not present

## 2015-08-12 DIAGNOSIS — I2510 Atherosclerotic heart disease of native coronary artery without angina pectoris: Secondary | ICD-10-CM

## 2015-08-12 NOTE — Patient Instructions (Signed)
Please get an OMRON blood pressure cuff and monitor for a few weeks  Your physician recommends that you schedule a follow-up appointment in 2-3 weeks with Belenda CruiseKristin (pharmacist) for a blood pressure check   Your physician wants you to follow-up in: 1 year with Dr. Rennis GoldenHilty. You will receive a reminder letter in the mail two months in advance. If you don't receive a letter, please call our office to schedule the follow-up appointment.

## 2015-08-13 NOTE — Progress Notes (Signed)
[name]   10/07/1945  [id number]  Primary Mack Hook, MD Primary Cardiologist: Dr Rennis Golden  CC: No symptoms  HPI:  70 y/o followed by Dr Rennis Golden with a history of CABG X 4 10/08. Myoview 3/13 low risk for ischemia. She is here for 6 month check up. Since we saw her last she has been doing well, no chest pain, palpitations, or unusual SOB. She last saw Corine Shelter who noted that she had discontinued her Synthroid for unknown reasons. She was since seen by her endocrinologist and those were restarted. She has her diabetes and cholesterol checked by her endocrinologist as well. She denies any chest pain or worsening shortness of breath. She does have some leg swelling. When she saw Corine Shelter, last in August, her LDL was 20. He recommended she stop Vytorin and switch to low-dose simvastatin. She did not do that.  [name] returns today for follow-up. She reports no new symptoms. She was last seen in December 2015. She reports fairly good blood sugar control. Blood pressure has been at goal. She denies any chest pain or worsening shortness of breath. She was taking Vytorin and switch to simvastatin and now is been placed back on Vytorin. She takes Lasix once a day. She denies any worsening swelling.   Current Outpatient Prescriptions  Medication Sig Dispense Refill  . aspirin 81 MG tablet Take 81 mg by mouth daily.    Marland Kitchen BIOTIN FORTE PO Take 1 tablet by mouth daily.     . cholecalciferol (VITAMIN D) 1000 UNITS tablet Take 1,000 Units by mouth 2 (two) times daily.     . Ezetimibe-Simvastatin (VYTORIN PO) Take 1 tablet by mouth daily.    . furosemide (LASIX) 40 MG tablet Take 1 tablet (40 mg total) by mouth daily. 90 tablet 3  . insulin lispro protamine-insulin lispro (HUMALOG 50/50) (50-50) 100 UNIT/ML SUSP Inject 8-10 Units into the skin 2 (two) times daily as needed (high blood sugar). Sliding scale    . insulin regular (NOVOLIN R,HUMULIN R) 100 units/mL injection  Inject 35 Units into the skin 2 (two) times daily before a meal.     . levothyroxine (SYNTHROID, LEVOTHROID) 50 MCG tablet Take 50 mcg by mouth daily.    Marland Kitchen lisinopril (PRINIVIL,ZESTRIL) 20 MG tablet TAKE 1 TABLET BY MOUTH TWICE A DAY (MUST SCHEDULE APPOINTMENT FOR FURTHER REFILLS) 180 tablet 1  . metoprolol tartrate (LOPRESSOR) 25 MG tablet Take 1 tablet (25 mg total) by mouth 2 (two) times daily. 180 tablet 3  . venlafaxine XR (EFFEXOR-XR) 150 MG 24 hr capsule Take 150 mg by mouth daily.    . vitamin C (ASCORBIC ACID) 500 MG tablet Take 500 mg by mouth daily.     No current facility-administered medications for this visit.    Allergies  Allergen Reactions  . Crestor [Rosuvastatin] Other (See Comments)    unknown  . Lipitor [Atorvastatin] Other (See Comments)    Very bad pain in hips and joints    Social History   Social History  . Marital Status: Married    Spouse Name: N/A  . Number of Children: 1  . Years of Education: 13   Occupational History  .  [employer]   Social History Main Topics  . Smoking status: Never Smoker   . Smokeless tobacco: Not on file  . Alcohol Use: No  . Drug Use: No  . Sexual Activity: No   Other Topics Concern  . Not on file  Social History Narrative     Review of Systems: General: negative for chills, fever, night sweats or weight changes.  Cardiovascular: negative for chest pain, dyspnea on exertion, edema, orthopnea, palpitations, paroxysmal nocturnal dyspnea or shortness of breath Dermatological: negative for rash Respiratory: negative for cough or wheezing Urologic: negative for hematuria Abdominal: negative for nausea, vomiting, diarrhea, bright red blood per rectum, melena, or hematemesis Neurologic: negative for visual changes, syncope, or dizziness All other systems reviewed and are otherwise negative except as noted above. She has peripheral neuropathy from diabetes, she has UTI's treated previously,   Blood pressure  94/58, pulse 62, height 5\' 2"  (1.575 m), weight 139 lb (63.05 kg).  General appearance: alert, cooperative and no distress Neck: no carotid bruit and no JVD Lungs: clear to auscultation bilaterally Heart: regular rate and rhythm  EKG: Sinus rhythm with PVCs at 62, incomplete right bundle branch pattern  ASSESSMENT AND PLAN:   Patient Active Problem List   Diagnosis Date Noted  . 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 Diabetes (HCC) 12/17/2012  . HTN (hypertension) 12/17/2012  . PAF (paroxysmal atrial fibrillation) (HCC) 12/17/2012  . Dyslipidemia 12/17/2012  . Hypothyroid 12/17/2012  . Depression 12/17/2012    PLAN  1.  Mrs. [name] is doing well without new symptoms. She has restarted taking Vytorin. Blood pressure is actually somewhat low today at 94/58. Is not clear whether this is a one-time reading or not. I advised her to get a blood pressure cuff and continue her current medications as she is asymptomatic. Will have her get her blood pressure checked with Belenda CruiseKristin, our hypertension specialist in 2 weeks. If blood pressure remains low or she is symptomatic, then I would decrease her lisinopril. It is noted she had PAF however this was post CABG in 2008 and she's had no recurrence therefore she is only on aspirin therapy.  Follow-up with me annually or sooner as necessary.  Chrystie NoseKenneth C. Jamarie Mussa, MD, [facility] Attending Cardiologist [facility]  Lisette AbuKenneth C [facility] 08/13/2015 6:11 PM

## 2015-08-16 ENCOUNTER — Other Ambulatory Visit: Payer: Self-pay | Admitting: Internal Medicine

## 2015-08-16 NOTE — Telephone Encounter (Signed)
Rx request sent to pharmacy.  

## 2015-08-26 ENCOUNTER — Other Ambulatory Visit: Payer: Self-pay | Admitting: *Deleted

## 2015-08-26 MED ORDER — FUROSEMIDE 40 MG PO TABS: 40.0000 mg | ORAL_TABLET | Freq: Every day | ORAL | Status: DC

## 2015-08-26 NOTE — Telephone Encounter (Signed)
Rx refill sent to pharmacy. 

## 2015-09-02 ENCOUNTER — Ambulatory Visit (INDEPENDENT_AMBULATORY_CARE_PROVIDER_SITE_OTHER): Payer: 59 | Admitting: Pharmacist

## 2015-09-02 VITALS — BP 124/58 | Wt 139.4 lb

## 2015-09-02 DIAGNOSIS — I10 Essential (primary) hypertension: Secondary | ICD-10-CM

## 2015-09-02 NOTE — Assessment & Plan Note (Signed)
BP is well below goal, but patient has no symptoms of hypotension. No medications changes at this time. Instructed her to call the clinic if she starts to experience symptoms of low blood pressure. Will follow-up in about 6 months to check home readings before follow-up with Dr. Rennis GoldenHilty annually. If BP remains low and she is symptomatic will decrease lisinopril dose at that time.

## 2015-09-02 NOTE — Patient Instructions (Addendum)
Return for a a follow up appointment in 6 months. Please call the clinic if you experience dizziness or blood pressure reading are consistently low.   Your blood pressure today is 124/58  Check your blood pressure at home daily (if able) and keep record of the readings.  Bring all of your meds, your BP cuff and your record of home blood pressures to your next appointment.  Exercise as you're able, try to walk approximately 30 minutes per day.     HOW TO TAKE YOUR BLOOD PRESSURE: . Rest 5 minutes before taking your blood pressure. .  Don't smoke or drink caffeinated beverages for at least 30 minutes before. . Take your blood pressure before (not after) you eat. . Sit comfortably with your back supported and both feet on the floor (don't cross your legs). . Elevate your arm to heart level on a table or a desk. . Use the proper sized cuff. It should fit smoothly and snugly around your bare upper arm. There should be enough room to slip a fingertip under the cuff. The bottom edge of the cuff should be 1 inch above the crease of the elbow. . Ideally, take 3 measurements at one sitting and record the average.

## 2015-09-02 NOTE — Progress Notes (Signed)
Patient ID:  [name], [name]                DOB:  06/24/1945                        MRN:  [id number]     HPI: [name] is a 70 y.o. female referred by Dr. Rennis Golden to HTN clinic for evaluation.  She was recently seen by Dr. Rennis Golden where her blood pressure was 94/58. She reports no symptoms of low blood pressure.   Today her blood pressure is 124/58.   Current HTN meds:  Furosemide  once daily Metoprolol tartrate  BID Lisinopril  once twice daily BP goal: 150/90  Home BP readings:  She recently purchased a cuff but has not yet started using it.  Wt Readings from Last 3 Encounters:  09/02/15 139 lb 6.4 oz (63.231 kg)  08/12/15 139 lb (63.05 kg)  07/25/15 140 lb (63.504 kg)   BP Readings from Last 3 Encounters:  09/02/15 124/58  08/12/15 94/58  07/26/15 165/69   Pulse Readings from Last 3 Encounters:  08/12/15 62  07/26/15 63  02/12/15 68    Renal function: CrCl cannot be calculated (Patient has no serum creatinine result on file.).  Past Medical History  Diagnosis Date  . Coronary artery disease 02/2007    CABG  . HTN (hypertension)   . Diabetes (HCC)   . PAF (paroxysmal atrial fibrillation) (HCC)     post op  . Hypothyroid   . Cholelithiases   . Dyslipidemia   . S/P CABG x 4 2008    Current Outpatient Prescriptions on File Prior to Visit  Medication Sig Dispense Refill  . BIOTIN FORTE PO Take 1 tablet by mouth daily.     . cholecalciferol (VITAMIN D) 1000 UNITS tablet Take 1,000 Units by mouth 2 (two) times daily.     . Ezetimibe-Simvastatin (VYTORIN PO) Take 1 tablet by mouth daily.    . furosemide (LASIX) 40 MG tablet Take 1 tablet (40 mg total) by mouth daily. 90 tablet 3  . insulin lispro protamine-insulin lispro (HUMALOG 50/50) (50-50) 100 UNIT/ML SUSP Inject 8-10 Units into the skin 2 (two) times daily as needed (high blood sugar). Sliding scale    . insulin regular (NOVOLIN R,HUMULIN R) 100 units/mL injection Inject 35 Units into the skin 2  (two) times daily before a meal.     . levothyroxine (SYNTHROID, LEVOTHROID) 50 MCG tablet Take 50 mcg by mouth daily.    Marland Kitchen lisinopril (PRINIVIL,ZESTRIL) 20 MG tablet TAKE 1 TABLET BY MOUTH TWICE A DAY (MUST SCHEDULE APPOINTMENT FOR FURTHER REFILLS) 180 tablet 1  . metoprolol tartrate (LOPRESSOR) 25 MG tablet TAKE 1 TABLET TWICE A DAY 180 tablet 3  . venlafaxine XR (EFFEXOR-XR) 150 MG 24 hr capsule Take 150 mg by mouth daily.    . vitamin C (ASCORBIC ACID) 500 MG tablet Take 500 mg by mouth daily.     No current facility-administered medications on file prior to visit.    Allergies  Allergen Reactions  . Crestor [Rosuvastatin] Other (See Comments)    unknown  . Lipitor [Atorvastatin] Other (See Comments)    Very bad pain in hips and joints     Assessment/Plan: BP is well below goal, but patient has no symptoms of hypotension. No medications changes at this time. Instructed her to call the clinic if she starts to experience symptoms of low blood pressure. Will follow-up in about 6 months  to check home readings before follow-up with Dr. Rennis GoldenHilty annually. If BP remains low and she is symptomatic will decrease lisinopril dose at that time.

## 2015-09-09 ENCOUNTER — Other Ambulatory Visit: Payer: Self-pay | Admitting: Internal Medicine

## 2015-09-09 NOTE — Telephone Encounter (Signed)
Rx(s) sent to pharmacy electronically.  

## 2015-10-29 ENCOUNTER — Other Ambulatory Visit: Payer: Self-pay | Admitting: Obstetrics and Gynecology

## 2015-10-29 DIAGNOSIS — R928 Other abnormal and inconclusive findings on diagnostic imaging of breast: Secondary | ICD-10-CM

## 2015-11-05 ENCOUNTER — Ambulatory Visit
Admission: RE | Admit: 2015-11-05 | Discharge: 2015-11-05 | Disposition: A | Discharge status: Home / Self Care | Payer: 59 | Source: Ambulatory Visit | Attending: Obstetrics and Gynecology | Admitting: Obstetrics and Gynecology

## 2015-11-05 DIAGNOSIS — R928 Other abnormal and inconclusive findings on diagnostic imaging of breast: Secondary | ICD-10-CM

## 2016-01-21 ENCOUNTER — Ambulatory Visit: Payer: 59 | Admitting: Podiatry

## 2016-01-28 ENCOUNTER — Ambulatory Visit: Payer: 59 | Admitting: Podiatry

## 2016-02-07 ENCOUNTER — Ambulatory Visit: Payer: 59 | Admitting: Podiatry

## 2016-02-14 ENCOUNTER — Ambulatory Visit (INDEPENDENT_AMBULATORY_CARE_PROVIDER_SITE_OTHER): Payer: 59

## 2016-02-14 ENCOUNTER — Other Ambulatory Visit: Payer: Self-pay | Admitting: Podiatry

## 2016-02-14 ENCOUNTER — Ambulatory Visit (INDEPENDENT_AMBULATORY_CARE_PROVIDER_SITE_OTHER): Payer: 59 | Admitting: Podiatry

## 2016-02-14 ENCOUNTER — Encounter: Payer: Self-pay | Admitting: Podiatry

## 2016-02-14 DIAGNOSIS — M79676 Pain in unspecified toe(s): Secondary | ICD-10-CM | POA: Diagnosis not present

## 2016-02-14 DIAGNOSIS — R52 Pain, unspecified: Secondary | ICD-10-CM

## 2016-02-14 DIAGNOSIS — B351 Tinea unguium: Secondary | ICD-10-CM | POA: Diagnosis not present

## 2016-02-14 NOTE — Progress Notes (Signed)
Subjective: 70 year old female presents the office today for concerns of thick, elongated tenderness that she cannot trim herself. She states that since her last appointment she has noticed that her right big toenails become thickened as well and syndrome on the second toe. She denies any redness or drainage the toenail sites. No recent injury or trauma. Denies any systemic complaints such as fevers, chills, nausea, vomiting. No acute changes since last appointment, and no other complaints at this time.   Objective: AAO x3, NAD DP/PT pulses palpable bilaterally, CRT less than 3 seconds Nails are somewhat hypertrophic, dystrophic, discolored, elongated 10. The right hallux toenail appears to be the most affected as it is thickened and discolored and there is subungual debris. Tenderness nails 1-5 bilaterally. No swelling redness or drainage or signs of infection. Hallux malleus is presently right hallux. No area pinpoint bony tenderness or pain the vibratory sensation. No open lesions or pre-ulcerative lesions.  No pain with calf compression, swelling, warmth, erythema  Assessment: Symptomatic onychomycosis  Plan: -All treatment options discussed with the patient including all alternatives, risks, complications.  -Nails rate 10 without complications or bleeding. Discussed with her treatment options for onychomycosis we will start with a  topical antifungal. Compound cream for onychomycosis today. -Follow-up in 3 months or sooner if needed. -Patient encouraged to call the office with any questions, concerns, change in symptoms.   Ovid CurdMatthew Wagoner, DPM

## 2016-02-15 ENCOUNTER — Telehealth: Payer: Self-pay | Admitting: *Deleted

## 2016-02-15 MED ORDER — NONFORMULARY OR COMPOUNDED ITEM
11 refills | Status: DC
Start: 2016-02-15 — End: 2018-02-07

## 2016-02-15 NOTE — Telephone Encounter (Signed)
Dr. Ardelle AntonWagoner ordered [facility] Nail Lacquer. [facility]

## 2016-05-15 ENCOUNTER — Ambulatory Visit: Payer: 59 | Admitting: Podiatry

## 2016-09-05 ENCOUNTER — Ambulatory Visit (INDEPENDENT_AMBULATORY_CARE_PROVIDER_SITE_OTHER): Payer: Medicare HMO | Admitting: Internal Medicine

## 2016-09-05 ENCOUNTER — Encounter: Payer: Self-pay | Admitting: Internal Medicine

## 2016-09-05 VITALS — BP 157/81 | HR 85 | Ht 62.0 in | Wt 143.2 lb

## 2016-09-05 DIAGNOSIS — I10 Essential (primary) hypertension: Secondary | ICD-10-CM | POA: Diagnosis not present

## 2016-09-05 DIAGNOSIS — E785 Hyperlipidemia, unspecified: Secondary | ICD-10-CM | POA: Diagnosis not present

## 2016-09-05 DIAGNOSIS — Z79899 Other long term (current) drug therapy: Secondary | ICD-10-CM | POA: Diagnosis not present

## 2016-09-05 DIAGNOSIS — I480 Paroxysmal atrial fibrillation: Secondary | ICD-10-CM

## 2016-09-05 DIAGNOSIS — I2510 Atherosclerotic heart disease of native coronary artery without angina pectoris: Secondary | ICD-10-CM | POA: Diagnosis not present

## 2016-09-05 MED ORDER — LISINOPRIL 20 MG PO TABS: 20.0000 mg | ORAL_TABLET | Freq: Two times a day (BID) | ORAL | 3 refills | Status: DC

## 2016-09-05 MED ORDER — EZETIMIBE-SIMVASTATIN 10-40 MG PO TABS: 1.0000 | ORAL_TABLET | Freq: Every day | ORAL | 3 refills | Status: DC

## 2016-09-05 NOTE — Progress Notes (Signed)
[name]   05-04-1946  [id number]  Primary Mack [place], MD Primary Cardiologist: Dr Rennis Golden  CC: Sad, lost her job  HPI:  71 y/o followed by Dr Rennis Golden with a history of CABG X 4 10/08. Myoview 3/13 low risk for ischemia. She is here for 6 month check up. Since we saw her last she has been doing well, no chest pain, palpitations, or unusual SOB. She last saw Corine Shelter who noted that she had discontinued her Synthroid for unknown reasons. She was since seen by her endocrinologist and those were restarted. She has her diabetes and cholesterol checked by her endocrinologist as well. She denies any chest pain or worsening shortness of breath. She does have some leg swelling. When she saw Corine Shelter, last in August, her LDL was 20. He recommended she stop Vytorin and switch to low-dose simvastatin. She did not do that.  [name] returns today for follow-up. She reports no new symptoms. She was last seen in December 2015. She reports fairly good blood sugar control. Blood pressure has been at goal. She denies any chest pain or worsening shortness of breath. She was taking Vytorin and switch to simvastatin and now is been placed back on Vytorin. She takes Lasix once a day. She denies any worsening swelling.  09/05/2016  [name] was seen today in follow-up. Unfortunately she lost her job in February and seems to be suffering some adjustment depression at this time. She denies any new chest pain worsening shortness of breath. She has run out of some of her medications including lisinopril. Blood pressure is elevated today. She is also off of her Vytorin. Weight is up about 4 pounds since her last office visit. She is the primary caretaker for her husband.   Current Outpatient Prescriptions  Medication Sig Dispense Refill  . aspirin 325 MG EC tablet Take 325 mg by mouth daily.    Marland Kitchen BIOTIN FORTE PO Take 1 tablet by mouth daily.     . cholecalciferol (VITAMIN D) 1000 UNITS  tablet Take 1,000 Units by mouth 2 (two) times daily.     . Ezetimibe-Simvastatin (VYTORIN PO) Take 1 tablet by mouth daily.    . furosemide (LASIX) 40 MG tablet Take 1 tablet (40 mg total) by mouth daily. 90 tablet 3  . insulin lispro protamine-insulin lispro (HUMALOG 50/50) (50-50) 100 UNIT/ML SUSP Inject 8-10 Units into the skin 2 (two) times daily as needed (high blood sugar). Sliding scale    . insulin regular (NOVOLIN R,HUMULIN R) 100 units/mL injection Inject 35 Units into the skin 2 (two) times daily before a meal.     . levothyroxine (SYNTHROID, LEVOTHROID) 50 MCG tablet Take 50 mcg by mouth daily.    Marland Kitchen lisinopril (PRINIVIL,ZESTRIL) 20 MG tablet TAKE 1 TABLET BY MOUTH TWICE A DAY (MUST SCHEDULE APPOINTMENT FOR FURTHER REFILLS) 180 tablet 1  . lisinopril (PRINIVIL,ZESTRIL) 20 MG tablet Take 1 tablet (20 mg total) by mouth 2 (two) times daily. 180 tablet 3  . metoprolol tartrate (LOPRESSOR) 25 MG tablet TAKE 1 TABLET TWICE A DAY 180 tablet 3  . NONFORMULARY OR COMPOUNDED ITEM [facility]:  Onychomycosis Nail Lacquer - Fluconazole 2%, Terbinafine 1%, DMSO, apply daily to affected area. 480 each 11  . venlafaxine XR (EFFEXOR-XR) 150 MG 24 hr capsule Take 150 mg by mouth daily.    . vitamin C (ASCORBIC ACID) 500 MG tablet Take 500 mg by mouth daily.     No current facility-administered medications for  this visit.     Allergies  Allergen Reactions  . Crestor [Rosuvastatin] Other (See Comments)    unknown  . Lipitor [Atorvastatin] Other (See Comments)    Very bad pain in hips and joints    Social History   Social History  . Marital status: Married    Spouse name: N/A  . Number of children: 1  . Years of education: 87   Occupational History  .  [employer]   Social History Main Topics  . Smoking status: Never Smoker  . Smokeless tobacco: Never Used  . Alcohol use No  . Drug use: No  . Sexual activity: No   Other Topics Concern  . Not on file   Social History  Narrative  . No narrative on file     Review of Systems: General: negative for chills, fever, night sweats or weight changes.  Cardiovascular: negative for chest pain, dyspnea on exertion, edema, orthopnea, palpitations, paroxysmal nocturnal dyspnea or shortness of breath Dermatological: negative for rash Respiratory: negative for cough or wheezing Urologic: negative for hematuria Abdominal: negative for nausea, vomiting, diarrhea, bright red blood per rectum, melena, or hematemesis Neurologic: negative for visual changes, syncope, or dizziness All other systems reviewed and are otherwise negative except as noted above. She has peripheral neuropathy from diabetes, she has UTI's treated previously,   Blood pressure (!) 157/81, pulse 85, height  (1.575 m), weight 143 lb 3.2 oz (65 kg).  General appearance: alert, no distress and mildly overweight Neck: no carotid bruit and no JVD Lungs: clear to auscultation bilaterally Heart: regular rate and rhythm Abdomen: soft, non-tender; bowel sounds normal; no masses,  no organomegaly Extremities: edema trace pedal and venous stasis dermatitis noted Pulses: 2+ and symmetric Skin: dusky feet Neurologic: Grossly normal Psych: Depressed mood  EKG: Normal sinus rhythm at 85, incomplete right bundle branch block, LVH with repolarization abnormality  ASSESSMENT AND PLAN:   Patient Active Problem List   Diagnosis Date Noted  . 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 Diabetes (HCC) 12/17/2012  . HTN (hypertension) 12/17/2012  . PAF (paroxysmal atrial fibrillation) (HCC) 12/17/2012  . Dyslipidemia 12/17/2012  . Hypothyroid 12/17/2012  . Depression 12/17/2012    PLAN  1.  [name] Has run out of her blood pressure medication as well as her cholesterol medicine. Unfortunate she lost her job and is somewhat dysthymic at this time. I think she would benefit from restarting her medications. She may need to be on antidepressant medication as she  struggled with depression in the past. She's not had any recurrent atrial fibrillation. Her diabetes is followed by Dr. Cleon Gustin. She denies any new coronary symptoms. We'll recheck a lipid profile in conference a metabolic profile today. Restart her home medications. Follow-up with me in 6 months.  Chrystie Nose, MD, [facility] Attending Cardiologist [facility]  Lisette Abu Vlasta Baskin 09/05/2016 1:30 PM

## 2016-09-05 NOTE — Patient Instructions (Addendum)
Your physician wants you to follow-up in: 6 months with Dr. Rennis Golden. You will receive a reminder letter in the mail two months in advance. If you don't receive a letter, please call our office to schedule the follow-up appointment.  Your physician recommends that you return for lab work fasting - lipid & CMET  Your physician has recommended you make the following change in your medication:  -- START vytorin 10/40mg  daily

## 2016-09-07 DIAGNOSIS — R69 Illness, unspecified: Secondary | ICD-10-CM | POA: Diagnosis not present

## 2016-09-30 DIAGNOSIS — R69 Illness, unspecified: Secondary | ICD-10-CM | POA: Diagnosis not present

## 2016-10-03 DIAGNOSIS — E7800 Pure hypercholesterolemia, unspecified: Secondary | ICD-10-CM | POA: Diagnosis not present

## 2016-10-03 DIAGNOSIS — Z0000 Encounter for general adult medical examination without abnormal findings: Secondary | ICD-10-CM | POA: Diagnosis not present

## 2016-10-03 DIAGNOSIS — E559 Vitamin D deficiency, unspecified: Secondary | ICD-10-CM | POA: Diagnosis not present

## 2016-10-03 DIAGNOSIS — N390 Urinary tract infection, site not specified: Secondary | ICD-10-CM | POA: Diagnosis not present

## 2016-10-03 DIAGNOSIS — I10 Essential (primary) hypertension: Secondary | ICD-10-CM | POA: Diagnosis not present

## 2016-10-03 DIAGNOSIS — E039 Hypothyroidism, unspecified: Secondary | ICD-10-CM | POA: Diagnosis not present

## 2016-10-03 DIAGNOSIS — E1165 Type 2 diabetes mellitus with hyperglycemia: Secondary | ICD-10-CM | POA: Diagnosis not present

## 2016-11-06 DIAGNOSIS — R69 Illness, unspecified: Secondary | ICD-10-CM | POA: Diagnosis not present

## 2016-12-18 ENCOUNTER — Ambulatory Visit (INDEPENDENT_AMBULATORY_CARE_PROVIDER_SITE_OTHER): Payer: Medicare HMO | Admitting: Podiatry

## 2016-12-18 ENCOUNTER — Ambulatory Visit (INDEPENDENT_AMBULATORY_CARE_PROVIDER_SITE_OTHER): Payer: Medicare HMO

## 2016-12-18 DIAGNOSIS — R609 Edema, unspecified: Secondary | ICD-10-CM

## 2016-12-18 DIAGNOSIS — B351 Tinea unguium: Secondary | ICD-10-CM

## 2016-12-18 DIAGNOSIS — G629 Polyneuropathy, unspecified: Secondary | ICD-10-CM

## 2016-12-18 DIAGNOSIS — S99922A Unspecified injury of left foot, initial encounter: Secondary | ICD-10-CM | POA: Diagnosis not present

## 2016-12-18 DIAGNOSIS — S9030XA Contusion of unspecified foot, initial encounter: Secondary | ICD-10-CM | POA: Diagnosis not present

## 2016-12-18 MED ORDER — MUPIROCIN 2 % EX OINT: 1.0000 "application " | TOPICAL_OINTMENT | Freq: Two times a day (BID) | CUTANEOUS | 2 refills | Status: DC

## 2016-12-20 NOTE — Progress Notes (Signed)
Subjective: 71 y.o. returns the office today for painful, elongated, thickened toenails which she cannot trim herself. Denies any redness or drainage around the nails. She also presents today for concerns of right foot pain that she sustained after a fall 3 weeks ago. Since the fall she has had swelling to the foot and a scab on the top of the foot. She denies any drainage or redness. No treatment since the fall. She did hurt her knee during the fall. No other injuries. Denies any acute changes since last appointment and no new complaints today. Denies any systemic complaints such as fevers, chills, nausea, vomiting.   Objective: AAO 3, NAD DP/PT pulses palpable, CRT less than 3 seconds Protective sensation decreased with Simms Weinstein monofilament Nails hypertrophic, dystrophic, elongated, brittle, discolored 10. There is tenderness overlying the nails 1-5 bilaterally. There is no surrounding erythema or drainage along the nail sites. There is mild swelling to the dorsal aspect of the right foot. Due to her neuropathy she has no significant areas of pinpoint tenderness. There is no erythema or increase in warmth. Small scabs present on the dorsal aspect of the foot that any draige or pus or any fluctuance or crepitus. No open lesions or pre-ulcerative lesions are identified. No other areas of tenderness bilateral lower extremities. No overlying edema, erythema, increased warmth. No pain with calf compression, swelling, warmth, erythema.  Assessment: Patient presents with symptomatic onychomycosis; Right foot injury  Plan: -Treatment options including alternatives, risks, complications were discussed -Nails sharply debrided 10 without complication/bleeding. -X-rays were obtained and reviewed. Swelling is present. No definitive evidence of acute fracture identified. Given the swelling as well as not being on the feeler due to neuropathy I recommended immobilization in  A surgical shoe to which  she agrees with. This was dispensed today. Elevation. -Discussed daily foot inspection. If there are any changes, to call the office immediately.  -Follow-up as scheduled or sooner if any problems are to arise. In the meantime, encouraged to call the office with any questions, concerns, changes symptoms.  Ovid CurdMatthew Wagoner, DPM

## 2017-01-01 ENCOUNTER — Ambulatory Visit: Payer: Medicare HMO | Admitting: Podiatry

## 2017-01-30 ENCOUNTER — Emergency Department (HOSPITAL_COMMUNITY): Payer: No Typology Code available for payment source

## 2017-01-30 ENCOUNTER — Inpatient Hospital Stay (HOSPITAL_COMMUNITY)
Admission: EM | Admit: 2017-01-30 | Discharge: 2017-02-19 | DRG: 957 | Disposition: A | Discharge status: Skilled Nursing Facility | Payer: No Typology Code available for payment source | Attending: General Surgery | Admitting: General Surgery

## 2017-01-30 ENCOUNTER — Encounter (HOSPITAL_COMMUNITY): Payer: Self-pay | Admitting: Emergency Medicine

## 2017-01-30 DIAGNOSIS — E039 Hypothyroidism, unspecified: Secondary | ICD-10-CM | POA: Diagnosis present

## 2017-01-30 DIAGNOSIS — I5021 Acute systolic (congestive) heart failure: Secondary | ICD-10-CM | POA: Diagnosis not present

## 2017-01-30 DIAGNOSIS — Z5189 Encounter for other specified aftercare: Secondary | ICD-10-CM | POA: Diagnosis not present

## 2017-01-30 DIAGNOSIS — Z951 Presence of aortocoronary bypass graft: Secondary | ICD-10-CM

## 2017-01-30 DIAGNOSIS — R778 Other specified abnormalities of plasma proteins: Secondary | ICD-10-CM

## 2017-01-30 DIAGNOSIS — R0902 Hypoxemia: Secondary | ICD-10-CM

## 2017-01-30 DIAGNOSIS — Z972 Presence of dental prosthetic device (complete) (partial): Secondary | ICD-10-CM

## 2017-01-30 DIAGNOSIS — R51 Headache: Secondary | ICD-10-CM | POA: Diagnosis not present

## 2017-01-30 DIAGNOSIS — M5489 Other dorsalgia: Secondary | ICD-10-CM | POA: Diagnosis not present

## 2017-01-30 DIAGNOSIS — D729 Disorder of white blood cells, unspecified: Secondary | ICD-10-CM | POA: Diagnosis not present

## 2017-01-30 DIAGNOSIS — Y95 Nosocomial condition: Secondary | ICD-10-CM | POA: Diagnosis not present

## 2017-01-30 DIAGNOSIS — S5011XA Contusion of right forearm, initial encounter: Secondary | ICD-10-CM | POA: Diagnosis not present

## 2017-01-30 DIAGNOSIS — S36031A Moderate laceration of spleen, initial encounter: Principal | ICD-10-CM | POA: Diagnosis present

## 2017-01-30 DIAGNOSIS — Z23 Encounter for immunization: Secondary | ICD-10-CM

## 2017-01-30 DIAGNOSIS — M79605 Pain in left leg: Secondary | ICD-10-CM | POA: Diagnosis not present

## 2017-01-30 DIAGNOSIS — S32811A Multiple fractures of pelvis with unstable disruption of pelvic ring, initial encounter for closed fracture: Secondary | ICD-10-CM | POA: Diagnosis not present

## 2017-01-30 DIAGNOSIS — B961 Klebsiella pneumoniae [K. pneumoniae] as the cause of diseases classified elsewhere: Secondary | ICD-10-CM | POA: Diagnosis present

## 2017-01-30 DIAGNOSIS — J90 Pleural effusion, not elsewhere classified: Secondary | ICD-10-CM | POA: Diagnosis not present

## 2017-01-30 DIAGNOSIS — N390 Urinary tract infection, site not specified: Secondary | ICD-10-CM | POA: Diagnosis not present

## 2017-01-30 DIAGNOSIS — I2510 Atherosclerotic heart disease of native coronary artery without angina pectoris: Secondary | ICD-10-CM | POA: Diagnosis present

## 2017-01-30 DIAGNOSIS — W44F3XA Food entering into or through a natural orifice, initial encounter: Secondary | ICD-10-CM

## 2017-01-30 DIAGNOSIS — S32810A Multiple fractures of pelvis with stable disruption of pelvic ring, initial encounter for closed fracture: Secondary | ICD-10-CM

## 2017-01-30 DIAGNOSIS — I472 Ventricular tachycardia: Secondary | ICD-10-CM | POA: Diagnosis not present

## 2017-01-30 DIAGNOSIS — R061 Stridor: Secondary | ICD-10-CM | POA: Diagnosis not present

## 2017-01-30 DIAGNOSIS — D62 Acute posthemorrhagic anemia: Secondary | ICD-10-CM | POA: Diagnosis not present

## 2017-01-30 DIAGNOSIS — S0990XA Unspecified injury of head, initial encounter: Secondary | ICD-10-CM | POA: Diagnosis not present

## 2017-01-30 DIAGNOSIS — S36039A Unspecified laceration of spleen, initial encounter: Secondary | ICD-10-CM | POA: Diagnosis not present

## 2017-01-30 DIAGNOSIS — E785 Hyperlipidemia, unspecified: Secondary | ICD-10-CM | POA: Diagnosis present

## 2017-01-30 DIAGNOSIS — I9589 Other hypotension: Secondary | ICD-10-CM | POA: Diagnosis not present

## 2017-01-30 DIAGNOSIS — S3282XA Multiple fractures of pelvis without disruption of pelvic ring, initial encounter for closed fracture: Secondary | ICD-10-CM

## 2017-01-30 DIAGNOSIS — S2242XA Multiple fractures of ribs, left side, initial encounter for closed fracture: Secondary | ICD-10-CM | POA: Diagnosis not present

## 2017-01-30 DIAGNOSIS — M542 Cervicalgia: Secondary | ICD-10-CM | POA: Diagnosis not present

## 2017-01-30 DIAGNOSIS — R0602 Shortness of breath: Secondary | ICD-10-CM | POA: Diagnosis not present

## 2017-01-30 DIAGNOSIS — I5041 Acute combined systolic (congestive) and diastolic (congestive) heart failure: Secondary | ICD-10-CM | POA: Diagnosis not present

## 2017-01-30 DIAGNOSIS — R41841 Cognitive communication deficit: Secondary | ICD-10-CM | POA: Diagnosis not present

## 2017-01-30 DIAGNOSIS — Z7982 Long term (current) use of aspirin: Secondary | ICD-10-CM

## 2017-01-30 DIAGNOSIS — I480 Paroxysmal atrial fibrillation: Secondary | ICD-10-CM | POA: Diagnosis present

## 2017-01-30 DIAGNOSIS — I493 Ventricular premature depolarization: Secondary | ICD-10-CM | POA: Diagnosis not present

## 2017-01-30 DIAGNOSIS — S32511A Fracture of superior rim of right pubis, initial encounter for closed fracture: Secondary | ICD-10-CM | POA: Diagnosis not present

## 2017-01-30 DIAGNOSIS — S329XXA Fracture of unspecified parts of lumbosacral spine and pelvis, initial encounter for closed fracture: Secondary | ICD-10-CM

## 2017-01-30 DIAGNOSIS — G9341 Metabolic encephalopathy: Secondary | ICD-10-CM | POA: Diagnosis not present

## 2017-01-30 DIAGNOSIS — S2691XA Contusion of heart, unspecified with or without hemopericardium, initial encounter: Secondary | ICD-10-CM | POA: Diagnosis present

## 2017-01-30 DIAGNOSIS — R079 Chest pain, unspecified: Secondary | ICD-10-CM

## 2017-01-30 DIAGNOSIS — S299XXA Unspecified injury of thorax, initial encounter: Secondary | ICD-10-CM | POA: Diagnosis not present

## 2017-01-30 DIAGNOSIS — R1312 Dysphagia, oropharyngeal phase: Secondary | ICD-10-CM | POA: Diagnosis not present

## 2017-01-30 DIAGNOSIS — R748 Abnormal levels of other serum enzymes: Secondary | ICD-10-CM | POA: Diagnosis not present

## 2017-01-30 DIAGNOSIS — S2232XA Fracture of one rib, left side, initial encounter for closed fracture: Secondary | ICD-10-CM | POA: Diagnosis not present

## 2017-01-30 DIAGNOSIS — I248 Other forms of acute ischemic heart disease: Secondary | ICD-10-CM | POA: Diagnosis not present

## 2017-01-30 DIAGNOSIS — J189 Pneumonia, unspecified organism: Secondary | ICD-10-CM | POA: Diagnosis not present

## 2017-01-30 DIAGNOSIS — S3210XA Unspecified fracture of sacrum, initial encounter for closed fracture: Secondary | ICD-10-CM | POA: Diagnosis not present

## 2017-01-30 DIAGNOSIS — A419 Sepsis, unspecified organism: Secondary | ICD-10-CM | POA: Diagnosis not present

## 2017-01-30 DIAGNOSIS — S36032A Major laceration of spleen, initial encounter: Secondary | ICD-10-CM | POA: Diagnosis not present

## 2017-01-30 DIAGNOSIS — S3993XA Unspecified injury of pelvis, initial encounter: Secondary | ICD-10-CM | POA: Diagnosis not present

## 2017-01-30 DIAGNOSIS — S06899A Other specified intracranial injury with loss of consciousness of unspecified duration, initial encounter: Secondary | ICD-10-CM | POA: Diagnosis not present

## 2017-01-30 DIAGNOSIS — R402413 Glasgow coma scale score 13-15, at hospital admission: Secondary | ICD-10-CM | POA: Diagnosis present

## 2017-01-30 DIAGNOSIS — N3000 Acute cystitis without hematuria: Secondary | ICD-10-CM

## 2017-01-30 DIAGNOSIS — I5043 Acute on chronic combined systolic (congestive) and diastolic (congestive) heart failure: Secondary | ICD-10-CM | POA: Diagnosis not present

## 2017-01-30 DIAGNOSIS — J9601 Acute respiratory failure with hypoxia: Secondary | ICD-10-CM | POA: Diagnosis not present

## 2017-01-30 DIAGNOSIS — D649 Anemia, unspecified: Secondary | ICD-10-CM | POA: Diagnosis not present

## 2017-01-30 DIAGNOSIS — E1140 Type 2 diabetes mellitus with diabetic neuropathy, unspecified: Secondary | ICD-10-CM | POA: Diagnosis present

## 2017-01-30 DIAGNOSIS — N179 Acute kidney failure, unspecified: Secondary | ICD-10-CM | POA: Diagnosis not present

## 2017-01-30 DIAGNOSIS — R109 Unspecified abdominal pain: Secondary | ICD-10-CM | POA: Diagnosis not present

## 2017-01-30 DIAGNOSIS — R6521 Severe sepsis with septic shock: Secondary | ICD-10-CM

## 2017-01-30 DIAGNOSIS — I110 Hypertensive heart disease with heart failure: Secondary | ICD-10-CM | POA: Diagnosis present

## 2017-01-30 DIAGNOSIS — R339 Retention of urine, unspecified: Secondary | ICD-10-CM | POA: Diagnosis not present

## 2017-01-30 DIAGNOSIS — J811 Chronic pulmonary edema: Secondary | ICD-10-CM

## 2017-01-30 DIAGNOSIS — T17920A Food in respiratory tract, part unspecified causing asphyxiation, initial encounter: Secondary | ICD-10-CM

## 2017-01-30 DIAGNOSIS — S32509A Unspecified fracture of unspecified pubis, initial encounter for closed fracture: Secondary | ICD-10-CM | POA: Diagnosis not present

## 2017-01-30 DIAGNOSIS — Z794 Long term (current) use of insulin: Secondary | ICD-10-CM

## 2017-01-30 DIAGNOSIS — T148XXA Other injury of unspecified body region, initial encounter: Secondary | ICD-10-CM | POA: Diagnosis not present

## 2017-01-30 DIAGNOSIS — Z8249 Family history of ischemic heart disease and other diseases of the circulatory system: Secondary | ICD-10-CM

## 2017-01-30 DIAGNOSIS — E875 Hyperkalemia: Secondary | ICD-10-CM | POA: Diagnosis not present

## 2017-01-30 DIAGNOSIS — J9600 Acute respiratory failure, unspecified whether with hypoxia or hypercapnia: Secondary | ICD-10-CM | POA: Diagnosis not present

## 2017-01-30 DIAGNOSIS — Z888 Allergy status to other drugs, medicaments and biological substances status: Secondary | ICD-10-CM

## 2017-01-30 DIAGNOSIS — I272 Pulmonary hypertension, unspecified: Secondary | ICD-10-CM | POA: Diagnosis present

## 2017-01-30 DIAGNOSIS — R7989 Other specified abnormal findings of blood chemistry: Secondary | ICD-10-CM

## 2017-01-30 DIAGNOSIS — I340 Nonrheumatic mitral (valve) insufficiency: Secondary | ICD-10-CM | POA: Diagnosis not present

## 2017-01-30 DIAGNOSIS — Y92410 Unspecified street and highway as the place of occurrence of the external cause: Secondary | ICD-10-CM

## 2017-01-30 DIAGNOSIS — I255 Ischemic cardiomyopathy: Secondary | ICD-10-CM | POA: Diagnosis present

## 2017-01-30 DIAGNOSIS — R252 Cramp and spasm: Secondary | ICD-10-CM | POA: Diagnosis not present

## 2017-01-30 DIAGNOSIS — I119 Hypertensive heart disease without heart failure: Secondary | ICD-10-CM | POA: Diagnosis not present

## 2017-01-30 DIAGNOSIS — S32810D Multiple fractures of pelvis with stable disruption of pelvic ring, subsequent encounter for fracture with routine healing: Secondary | ICD-10-CM | POA: Diagnosis not present

## 2017-01-30 DIAGNOSIS — J150 Pneumonia due to Klebsiella pneumoniae: Secondary | ICD-10-CM | POA: Diagnosis not present

## 2017-01-30 DIAGNOSIS — I4729 Other ventricular tachycardia: Secondary | ICD-10-CM

## 2017-01-30 DIAGNOSIS — S060X9A Concussion with loss of consciousness of unspecified duration, initial encounter: Secondary | ICD-10-CM | POA: Diagnosis not present

## 2017-01-30 DIAGNOSIS — I509 Heart failure, unspecified: Secondary | ICD-10-CM | POA: Diagnosis not present

## 2017-01-30 DIAGNOSIS — R918 Other nonspecific abnormal finding of lung field: Secondary | ICD-10-CM | POA: Diagnosis not present

## 2017-01-30 DIAGNOSIS — S199XXA Unspecified injury of neck, initial encounter: Secondary | ICD-10-CM | POA: Diagnosis not present

## 2017-01-30 DIAGNOSIS — R278 Other lack of coordination: Secondary | ICD-10-CM | POA: Diagnosis not present

## 2017-01-30 DIAGNOSIS — S82402A Unspecified fracture of shaft of left fibula, initial encounter for closed fracture: Secondary | ICD-10-CM | POA: Diagnosis present

## 2017-01-30 DIAGNOSIS — S32499A Other specified fracture of unspecified acetabulum, initial encounter for closed fracture: Secondary | ICD-10-CM | POA: Diagnosis not present

## 2017-01-30 DIAGNOSIS — R2689 Other abnormalities of gait and mobility: Secondary | ICD-10-CM | POA: Diagnosis not present

## 2017-01-30 DIAGNOSIS — R069 Unspecified abnormalities of breathing: Secondary | ICD-10-CM

## 2017-01-30 DIAGNOSIS — S3991XA Unspecified injury of abdomen, initial encounter: Secondary | ICD-10-CM | POA: Diagnosis not present

## 2017-01-30 DIAGNOSIS — S8992XA Unspecified injury of left lower leg, initial encounter: Secondary | ICD-10-CM | POA: Diagnosis not present

## 2017-01-30 DIAGNOSIS — M6281 Muscle weakness (generalized): Secondary | ICD-10-CM | POA: Diagnosis not present

## 2017-01-30 DIAGNOSIS — S2241XA Multiple fractures of ribs, right side, initial encounter for closed fracture: Secondary | ICD-10-CM | POA: Diagnosis not present

## 2017-01-30 DIAGNOSIS — Z90710 Acquired absence of both cervix and uterus: Secondary | ICD-10-CM

## 2017-01-30 LAB — I-STAT CHEM 8, ED
BUN: 14 mg/dL (ref 6–20)
CHLORIDE: 97 mmol/L — AB (ref 101–111)
CREATININE: 0.9 mg/dL (ref 0.44–1.00)
Calcium, Ion: 1.16 mmol/L (ref 1.15–1.40)
Glucose, Bld: 302 mg/dL — ABNORMAL HIGH (ref 65–99)
HEMATOCRIT: 30 % — AB (ref 36.0–46.0)
Hemoglobin: 10.2 g/dL — ABNORMAL LOW (ref 12.0–15.0)
POTASSIUM: 3.3 mmol/L — AB (ref 3.5–5.1)
SODIUM: 137 mmol/L (ref 135–145)
TCO2: 25 mmol/L (ref 22–32)

## 2017-01-30 LAB — CBC WITH DIFFERENTIAL/PLATELET
BASOS ABS: 0 10*3/uL (ref 0.0–0.1)
BASOS PCT: 0 %
EOS ABS: 0.1 10*3/uL (ref 0.0–0.7)
Eosinophils Relative: 1 %
HEMATOCRIT: 30.1 % — AB (ref 36.0–46.0)
HEMOGLOBIN: 10.1 g/dL — AB (ref 12.0–15.0)
LYMPHS PCT: 12 %
Lymphs Abs: 1.9 10*3/uL (ref 0.7–4.0)
MCH: 27.9 pg (ref 26.0–34.0)
MCHC: 33.6 g/dL (ref 30.0–36.0)
MCV: 83.1 fL (ref 78.0–100.0)
MONOS PCT: 5 %
Monocytes Absolute: 0.9 10*3/uL (ref 0.1–1.0)
NEUTROS ABS: 13.6 10*3/uL — AB (ref 1.7–7.7)
Neutrophils Relative %: 82 %
Platelets: 259 10*3/uL (ref 150–400)
RBC: 3.62 MIL/uL — AB (ref 3.87–5.11)
RDW: 13.5 % (ref 11.5–15.5)
WBC: 16.5 10*3/uL — AB (ref 4.0–10.5)

## 2017-01-30 LAB — I-STAT TROPONIN, ED: TROPONIN I, POC: 0.42 ng/mL — AB (ref 0.00–0.08)

## 2017-01-30 LAB — COMPREHENSIVE METABOLIC PANEL
ALBUMIN: 3.4 g/dL — AB (ref 3.5–5.0)
ALT: 43 U/L (ref 14–54)
AST: 93 U/L — AB (ref 15–41)
Alkaline Phosphatase: 85 U/L (ref 38–126)
Anion gap: 8 (ref 5–15)
BILIRUBIN TOTAL: 0.7 mg/dL (ref 0.3–1.2)
BUN: 13 mg/dL (ref 6–20)
CO2: 26 mmol/L (ref 22–32)
CREATININE: 0.98 mg/dL (ref 0.44–1.00)
Calcium: 9 mg/dL (ref 8.9–10.3)
Chloride: 100 mmol/L — ABNORMAL LOW (ref 101–111)
GFR calc Af Amer: >60 mL/min (ref >60)
GFR, EST NON AFRICAN AMERICAN: 56 mL/min — AB (ref >60)
GLUCOSE: 301 mg/dL — AB (ref 65–99)
POTASSIUM: 3.3 mmol/L — AB (ref 3.5–5.1)
Sodium: 134 mmol/L — ABNORMAL LOW (ref 135–145)
TOTAL PROTEIN: 6.4 g/dL — AB (ref 6.5–8.1)

## 2017-01-30 LAB — PROTIME-INR
INR: 1.12
PROTHROMBIN TIME: 14.3 s (ref 11.4–15.2)

## 2017-01-30 MED ORDER — FENTANYL CITRATE (PF) 100 MCG/2ML IJ SOLN
25.0000 ug | Freq: Once | INTRAMUSCULAR | Status: DC
  Filled 2017-01-30: qty 2

## 2017-01-30 MED ORDER — IOPAMIDOL (ISOVUE-300) INJECTION 61%
INTRAVENOUS | Status: AC
  Administered 2017-01-30: 100 mL
  Filled 2017-01-30: qty 100

## 2017-01-30 MED ORDER — FENTANYL CITRATE (PF) 100 MCG/2ML IJ SOLN
25.0000 ug | Freq: Once | INTRAMUSCULAR | Status: AC
  Administered 2017-01-30: 25 ug via INTRAVENOUS

## 2017-01-30 MED ORDER — ACETAMINOPHEN 325 MG PO TABS
650.0000 mg | ORAL_TABLET | ORAL | Status: DC | PRN
  Administered 2017-02-01: 650 mg via ORAL
  Filled 2017-01-30: qty 2

## 2017-01-30 MED ORDER — MORPHINE SULFATE (PF) 4 MG/ML IV SOLN
2.0000 mg | INTRAVENOUS | Status: DC | PRN
Start: 2017-01-30 — End: 2017-02-05
  Administered 2017-01-30 – 2017-02-05 (×16): 2 mg via INTRAVENOUS
  Filled 2017-01-30 (×16): qty 1

## 2017-01-30 MED ORDER — ONDANSETRON HCL 4 MG/2ML IJ SOLN
4.0000 mg | Freq: Four times a day (QID) | INTRAMUSCULAR | Status: DC | PRN
  Administered 2017-01-30 – 2017-02-08 (×6): 4 mg via INTRAVENOUS
  Filled 2017-01-30 (×6): qty 2

## 2017-01-30 MED ORDER — ONDANSETRON 4 MG PO TBDP
4.0000 mg | ORAL_TABLET | Freq: Four times a day (QID) | ORAL | Status: DC | PRN
  Filled 2017-01-30: qty 1

## 2017-01-30 MED ORDER — FENTANYL CITRATE (PF) 100 MCG/2ML IJ SOLN
25.0000 ug | Freq: Once | INTRAMUSCULAR | Status: AC
  Administered 2017-01-30: 25 ug via INTRAVENOUS
  Filled 2017-01-30: qty 2
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
## 2017-01-30 NOTE — ED Notes (Signed)
Admitting Provider at bedside. 
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
## 2017-01-30 NOTE — ED Notes (Signed)
Patient transported to CT 

## 2017-01-30 NOTE — ED Notes (Signed)
IR/Dr.McCollough page to [facility]
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
## 2017-01-30 NOTE — ED Notes (Signed)
ED Provider at bedside. 

## 2017-01-31 ENCOUNTER — Encounter (HOSPITAL_COMMUNITY): Payer: Self-pay | Admitting: General Surgery

## 2017-01-31 ENCOUNTER — Inpatient Hospital Stay (HOSPITAL_COMMUNITY): Payer: No Typology Code available for payment source

## 2017-01-31 HISTORY — PX: IR EMBO ART  VEN HEMORR LYMPH EXTRAV  INC GUIDE ROADMAPPING: IMG5450

## 2017-01-31 HISTORY — PX: IR US GUIDE VASC ACCESS RIGHT: IMG2390

## 2017-01-31 HISTORY — PX: IR ANGIOGRAM SELECTIVE EACH ADDITIONAL VESSEL: IMG667

## 2017-01-31 HISTORY — PX: IR ANGIOGRAM VISCERAL SELECTIVE: IMG657

## 2017-01-31 LAB — URINALYSIS, COMPLETE (UACMP) WITH MICROSCOPIC
Bilirubin Urine: NEGATIVE
Glucose, UA: NEGATIVE mg/dL
KETONES UR: NEGATIVE mg/dL
Nitrite: NEGATIVE
PH: 5 (ref 5.0–8.0)
PROTEIN: NEGATIVE mg/dL
SQUAMOUS EPITHELIAL / LPF: NONE SEEN
Specific Gravity, Urine: >1.046 — ABNORMAL HIGH (ref 1.005–1.030)

## 2017-01-31 LAB — CBC
HCT: 25.6 % — ABNORMAL LOW (ref 36.0–46.0)
HEMATOCRIT: 28.8 % — AB (ref 36.0–46.0)
HEMATOCRIT: 26.4 % — AB (ref 36.0–46.0)
HEMATOCRIT: 25.1 % — AB (ref 36.0–46.0)
HEMOGLOBIN: 9.5 g/dL — AB (ref 12.0–15.0)
HEMOGLOBIN: 8.9 g/dL — AB (ref 12.0–15.0)
Hemoglobin: 8.5 g/dL — ABNORMAL LOW (ref 12.0–15.0)
Hemoglobin: 8.3 g/dL — ABNORMAL LOW (ref 12.0–15.0)
MCH: 27.9 pg (ref 26.0–34.0)
MCH: 28.7 pg (ref 26.0–34.0)
MCH: 28.2 pg (ref 26.0–34.0)
MCH: 28.3 pg (ref 26.0–34.0)
MCHC: 33 g/dL (ref 30.0–36.0)
MCHC: 33.7 g/dL (ref 30.0–36.0)
MCHC: 33.2 g/dL (ref 30.0–36.0)
MCHC: 33.1 g/dL (ref 30.0–36.0)
MCV: 84.7 fL (ref 78.0–100.0)
MCV: 85.2 fL (ref 78.0–100.0)
MCV: 85 fL (ref 78.0–100.0)
MCV: 85.7 fL (ref 78.0–100.0)
PLATELETS: 231 10*3/uL (ref 150–400)
Platelets: 230 10*3/uL (ref 150–400)
Platelets: 202 10*3/uL (ref 150–400)
Platelets: 203 10*3/uL (ref 150–400)
RBC: 3.4 MIL/uL — ABNORMAL LOW (ref 3.87–5.11)
RBC: 3.1 MIL/uL — AB (ref 3.87–5.11)
RBC: 3.01 MIL/uL — AB (ref 3.87–5.11)
RBC: 2.93 MIL/uL — ABNORMAL LOW (ref 3.87–5.11)
RDW: 13.8 % (ref 11.5–15.5)
RDW: 14.2 % (ref 11.5–15.5)
RDW: 14 % (ref 11.5–15.5)
RDW: 14.5 % (ref 11.5–15.5)
WBC: 20.6 10*3/uL — ABNORMAL HIGH (ref 4.0–10.5)
WBC: 16.7 10*3/uL — AB (ref 4.0–10.5)
WBC: 17.1 10*3/uL — ABNORMAL HIGH (ref 4.0–10.5)
WBC: 19.2 10*3/uL — ABNORMAL HIGH (ref 4.0–10.5)

## 2017-01-31 LAB — BASIC METABOLIC PANEL
ANION GAP: 11 (ref 5–15)
BUN: 19 mg/dL (ref 6–20)
CHLORIDE: 99 mmol/L — AB (ref 101–111)
CO2: 25 mmol/L (ref 22–32)
Calcium: 8.5 mg/dL — ABNORMAL LOW (ref 8.9–10.3)
Creatinine, Ser: 1.34 mg/dL — ABNORMAL HIGH (ref 0.44–1.00)
GFR calc Af Amer: 45 mL/min — ABNORMAL LOW (ref >60)
GFR, EST NON AFRICAN AMERICAN: 39 mL/min — AB (ref >60)
GLUCOSE: 288 mg/dL — AB (ref 65–99)
POTASSIUM: 4.4 mmol/L (ref 3.5–5.1)
Sodium: 135 mmol/L (ref 135–145)

## 2017-01-31 LAB — PREPARE RBC (CROSSMATCH)

## 2017-01-31 LAB — MRSA PCR SCREENING: MRSA BY PCR: NEGATIVE

## 2017-01-31 LAB — GLUCOSE, CAPILLARY
Glucose-Capillary: 115 mg/dL — ABNORMAL HIGH (ref 65–99)
Glucose-Capillary: 138 mg/dL — ABNORMAL HIGH (ref 65–99)
Glucose-Capillary: 258 mg/dL — ABNORMAL HIGH (ref 65–99)
Glucose-Capillary: 263 mg/dL — ABNORMAL HIGH (ref 65–99)
Glucose-Capillary: 297 mg/dL — ABNORMAL HIGH (ref 65–99)

## 2017-01-31 MED ORDER — INFLUENZA VAC SPLIT HIGH-DOSE 0.5 ML IM SUSY
0.5000 mL | PREFILLED_SYRINGE | INTRAMUSCULAR | Status: AC
  Administered 2017-02-01: 0.5 mL via INTRAMUSCULAR
  Filled 2017-01-31: qty 0.5

## 2017-01-31 MED ORDER — FENTANYL CITRATE (PF) 100 MCG/2ML IJ SOLN
INTRAMUSCULAR | Status: AC | PRN
  Administered 2017-01-31: 50 ug via INTRAVENOUS

## 2017-01-31 MED ORDER — GABAPENTIN 300 MG PO CAPS
300.0000 mg | ORAL_CAPSULE | Freq: Three times a day (TID) | ORAL | Status: DC
  Administered 2017-01-31 – 2017-02-02 (×6): 300 mg via ORAL
  Filled 2017-01-31 (×6): qty 1

## 2017-01-31 MED ORDER — SODIUM CHLORIDE 0.9 % IV SOLN
INTRAVENOUS | Status: AC | PRN
  Administered 2017-01-31: 75 mL/h via INTRAVENOUS

## 2017-01-31 MED ORDER — TRAMADOL HCL 50 MG PO TABS
50.0000 mg | ORAL_TABLET | Freq: Four times a day (QID) | ORAL | Status: DC
  Administered 2017-01-31 – 2017-02-04 (×13): 50 mg via ORAL
  Filled 2017-01-31 (×14): qty 1

## 2017-01-31 MED ORDER — SODIUM CHLORIDE 0.9 % IV SOLN: Freq: Once | INTRAVENOUS | Status: DC

## 2017-01-31 MED ORDER — ORAL CARE MOUTH RINSE
15.0000 mL | Freq: Two times a day (BID) | OROMUCOSAL | Status: DC
  Administered 2017-01-31 – 2017-02-08 (×12): 15 mL via OROMUCOSAL

## 2017-01-31 MED ORDER — FENTANYL CITRATE (PF) 100 MCG/2ML IJ SOLN
INTRAMUSCULAR | Status: AC
  Filled 2017-01-31: qty 4

## 2017-01-31 MED ORDER — METOPROLOL TARTRATE 5 MG/5ML IV SOLN
5.0000 mg | Freq: Four times a day (QID) | INTRAVENOUS | Status: DC | PRN
  Administered 2017-02-05: 5 mg via INTRAVENOUS
  Filled 2017-01-31: qty 5

## 2017-01-31 MED ORDER — IOPAMIDOL (ISOVUE-300) INJECTION 61%
INTRAVENOUS | Status: AC
  Administered 2017-01-31: 90 mL
  Filled 2017-01-31: qty 200

## 2017-01-31 MED ORDER — SODIUM CHLORIDE 0.9 % IV SOLN
INTRAVENOUS | Status: DC
  Administered 2017-01-31 – 2017-02-04 (×7): via INTRAVENOUS

## 2017-01-31 MED ORDER — MIDAZOLAM HCL 2 MG/2ML IJ SOLN
INTRAMUSCULAR | Status: AC
  Filled 2017-01-31: qty 4

## 2017-01-31 MED ORDER — PNEUMOCOCCAL VAC POLYVALENT 25 MCG/0.5ML IJ INJ
0.5000 mL | INJECTION | INTRAMUSCULAR | Status: AC
  Administered 2017-02-01: 0.5 mL via INTRAMUSCULAR
  Filled 2017-01-31: qty 0.5

## 2017-01-31 MED ORDER — LIDOCAINE HCL (PF) 1 % IJ SOLN
INTRAMUSCULAR | Status: AC | PRN
  Administered 2017-01-31: 10 mL

## 2017-01-31 MED ORDER — METOPROLOL TARTRATE 25 MG PO TABS
25.0000 mg | ORAL_TABLET | Freq: Two times a day (BID) | ORAL | Status: DC
  Administered 2017-01-31 – 2017-02-04 (×8): 25 mg via ORAL
  Filled 2017-01-31 (×8): qty 1

## 2017-01-31 MED ORDER — LIDOCAINE HCL 1 % IJ SOLN
INTRAMUSCULAR | Status: AC
  Filled 2017-01-31: qty 20

## 2017-01-31 MED ORDER — MIDAZOLAM HCL 2 MG/2ML IJ SOLN
INTRAMUSCULAR | Status: AC | PRN
  Administered 2017-01-31: 1 mg via INTRAVENOUS

## 2017-01-31 MED ORDER — INSULIN ASPART 100 UNIT/ML ~~LOC~~ SOLN
0.0000 [IU] | Freq: Three times a day (TID) | SUBCUTANEOUS | Status: DC
  Administered 2017-01-31 (×2): 8 [IU] via SUBCUTANEOUS
  Administered 2017-02-01: 3 [IU] via SUBCUTANEOUS
  Administered 2017-02-01: 5 [IU] via SUBCUTANEOUS
  Administered 2017-02-01 – 2017-02-02 (×2): 3 [IU] via SUBCUTANEOUS
  Administered 2017-02-02: 15 [IU] via SUBCUTANEOUS
  Administered 2017-02-02: 3 [IU] via SUBCUTANEOUS
  Administered 2017-02-03 (×2): 5 [IU] via SUBCUTANEOUS
  Administered 2017-02-03: 8 [IU] via SUBCUTANEOUS
  Administered 2017-02-04: 5 [IU] via SUBCUTANEOUS
  Administered 2017-02-05: 8 [IU] via SUBCUTANEOUS
  Administered 2017-02-05 (×2): 5 [IU] via SUBCUTANEOUS
  Administered 2017-02-06 (×2): 8 [IU] via SUBCUTANEOUS
  Administered 2017-02-06: 5 [IU] via SUBCUTANEOUS
  Administered 2017-02-07: 8 [IU] via SUBCUTANEOUS
  Administered 2017-02-07: 11 [IU] via SUBCUTANEOUS

## 2017-01-31 NOTE — Sedation Documentation (Signed)
Patient is resting comfortably. 
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
## 2017-01-31 NOTE — Progress Notes (Signed)
Inpatient Diabetes Program Recommendations  AACE/ADA: New Consensus Statement on Inpatient Glycemic Control (2015)  Target Ranges:  Prepandial:   less than 140 mg/dL      Peak postprandial:   less than 180 mg/dL (1-2 hours)      Critically ill patients:  140 - 180 mg/dL   Lab Results  Component Value Date   GLUCAP 263 (H) 01/31/2017   HGBA1C (H) 02/19/2009    10.2 (NOTE) The ADA recommends the following therapeutic goal for glycemic control related to Hgb A1c measurement: Goal of therapy: <6.5 Hgb A1c  Reference: American Diabetes Association: Clinical Practice Recommendations 2010, Diabetes Care, 2010, 33: (Suppl  1).    Review of Glycemic ControlResults for ALFRETTA, PINCH (MRN [id number]) as of 01/31/2017 13:08  Ref. Range 01/31/2017 00:27 01/31/2017 08:10 01/31/2017 11:20  Glucose-Capillary Latest Ref Range: 65 - 99 mg/dL 409 (H) 811 (H) 914 (H)   Diabetes history: Type 2 diabetes Outpatient Diabetes medications: Tresiba 30 units daily  Current orders for Inpatient glycemic control:  Novolog moderate tid with meals  Inpatient Diabetes Program Recommendations:    Please consider decreasing sensitivity of Novolog correction to sensitive and increase frequency to q 4 hours.  Also please add Lantus 15 units daily (1/2 of home basal insulin).   Thanks, Beryl Meager, RN, BC-ADM Inpatient Diabetes Coordinator Pager [phone] (8a-5p)
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
## 2017-01-31 NOTE — Procedures (Signed)
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    Complications: None Immediate  EBL: None  Keep right leg straight for 4 hrs.    SignedSimonne Come Pager: [phone] 01/31/2017, 4:09 PM
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
## 2017-02-01 ENCOUNTER — Inpatient Hospital Stay (HOSPITAL_COMMUNITY): Payer: No Typology Code available for payment source

## 2017-02-01 DIAGNOSIS — I248 Other forms of acute ischemic heart disease: Secondary | ICD-10-CM

## 2017-02-01 DIAGNOSIS — R7989 Other specified abnormal findings of blood chemistry: Secondary | ICD-10-CM

## 2017-02-01 DIAGNOSIS — R748 Abnormal levels of other serum enzymes: Secondary | ICD-10-CM

## 2017-02-01 DIAGNOSIS — R778 Other specified abnormalities of plasma proteins: Secondary | ICD-10-CM

## 2017-02-01 DIAGNOSIS — I119 Hypertensive heart disease without heart failure: Secondary | ICD-10-CM

## 2017-02-01 DIAGNOSIS — R0602 Shortness of breath: Secondary | ICD-10-CM

## 2017-02-01 LAB — CBC
HCT: 23.7 % — ABNORMAL LOW (ref 36.0–46.0)
HCT: 24.2 % — ABNORMAL LOW (ref 36.0–46.0)
HEMOGLOBIN: 7.8 g/dL — AB (ref 12.0–15.0)
Hemoglobin: 7.7 g/dL — ABNORMAL LOW (ref 12.0–15.0)
MCH: 28.2 pg (ref 26.0–34.0)
MCH: 28.4 pg (ref 26.0–34.0)
MCHC: 32.2 g/dL (ref 30.0–36.0)
MCHC: 32.5 g/dL (ref 30.0–36.0)
MCV: 86.8 fL (ref 78.0–100.0)
MCV: 88 fL (ref 78.0–100.0)
PLATELETS: 211 10*3/uL (ref 150–400)
Platelets: 202 10*3/uL (ref 150–400)
RBC: 2.73 MIL/uL — AB (ref 3.87–5.11)
RBC: 2.75 MIL/uL — ABNORMAL LOW (ref 3.87–5.11)
RDW: 14.6 % (ref 11.5–15.5)
RDW: 14.6 % (ref 11.5–15.5)
WBC: 22.2 10*3/uL — AB (ref 4.0–10.5)
WBC: 23.4 10*3/uL — AB (ref 4.0–10.5)

## 2017-02-01 LAB — BASIC METABOLIC PANEL
ANION GAP: 10 (ref 5–15)
BUN: 22 mg/dL — AB (ref 6–20)
CHLORIDE: 104 mmol/L (ref 101–111)
CO2: 24 mmol/L (ref 22–32)
Calcium: 8.1 mg/dL — ABNORMAL LOW (ref 8.9–10.3)
Creatinine, Ser: 1.36 mg/dL — ABNORMAL HIGH (ref 0.44–1.00)
GFR calc Af Amer: 44 mL/min — ABNORMAL LOW (ref >60)
GFR, EST NON AFRICAN AMERICAN: 38 mL/min — AB (ref >60)
GLUCOSE: 139 mg/dL — AB (ref 65–99)
POTASSIUM: 4.3 mmol/L (ref 3.5–5.1)
Sodium: 138 mmol/L (ref 135–145)

## 2017-02-01 LAB — TROPONIN I
TROPONIN I: 2.39 ng/mL — AB (ref <0.03)
Troponin I: 0.88 ng/mL (ref <0.03)
Troponin I: 2.53 ng/mL (ref <0.03)
Troponin I: 2.5 ng/mL (ref <0.03)
Troponin I: 3 ng/mL (ref <0.03)

## 2017-02-01 LAB — GLUCOSE, CAPILLARY
GLUCOSE-CAPILLARY: 182 mg/dL — AB (ref 65–99)
Glucose-Capillary: 210 mg/dL — ABNORMAL HIGH (ref 65–99)
Glucose-Capillary: 180 mg/dL — ABNORMAL HIGH (ref 65–99)

## 2017-02-01 MED ORDER — DIPHENHYDRAMINE HCL 50 MG/ML IJ SOLN
12.5000 mg | Freq: Four times a day (QID) | INTRAMUSCULAR | Status: DC | PRN
  Administered 2017-02-02 – 2017-02-09 (×2): 12.5 mg via INTRAVENOUS
  Filled 2017-02-01 (×2): qty 1

## 2017-02-01 MED ORDER — DOCUSATE SODIUM 100 MG PO CAPS
100.0000 mg | ORAL_CAPSULE | Freq: Two times a day (BID) | ORAL | Status: DC
  Administered 2017-02-01 – 2017-02-19 (×28): 100 mg via ORAL
  Filled 2017-02-01 (×30): qty 1

## 2017-02-01 MED ORDER — METHOCARBAMOL 1000 MG/10ML IJ SOLN
1000.0000 mg | Freq: Three times a day (TID) | INTRAVENOUS | Status: DC | PRN
  Administered 2017-02-01 (×2): 1000 mg via INTRAVENOUS
  Filled 2017-02-01 (×5): qty 10

## 2017-02-01 MED ORDER — DIPHENHYDRAMINE HCL 50 MG/ML IJ SOLN
12.5000 mg | Freq: Once | INTRAMUSCULAR | Status: AC
  Administered 2017-02-01: 12.5 mg via INTRAVENOUS
  Filled 2017-02-01: qty 1

## 2017-02-01 NOTE — Progress Notes (Signed)
RT instructed pt and family on the use of flutter valve.  Pt having a difficult time staying awake.  Pt's effort weak.  RT asked RN and family to continue to coach pt for better results.

## 2017-02-01 NOTE — Progress Notes (Signed)
Trauma MD notified that patient is complaining of shortness of breath/trouble breathing. Order placed for chest x-ray. Will continue to monitor.
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
## 2017-02-01 NOTE — Progress Notes (Signed)
Trauma MD notified that patient is complaining of heaviness in chest. Order received for EKG and troponin. Will continue to monitor patient.

## 2017-02-01 NOTE — Progress Notes (Signed)
Trauma MD notified of elevated troponin at 0.88. Will check troponin again in 4 hours as directed by MD.

## 2017-02-01 NOTE — Progress Notes (Signed)
Trauma MD paged in regards to troponin of 2.53. MD notified that troponin is now elevated at 2.53. Troponin was 0.88 approximately 4 hours ago. Patient complaining of shortness of breath and heaviness in chest. EKG completed at this time. Chest x-ray obtained.
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
## 2017-02-02 ENCOUNTER — Inpatient Hospital Stay (HOSPITAL_COMMUNITY): Payer: No Typology Code available for payment source

## 2017-02-02 LAB — HEMOGLOBIN AND HEMATOCRIT, BLOOD
HCT: 28 % — ABNORMAL LOW (ref 36.0–46.0)
Hemoglobin: 9 g/dL — ABNORMAL LOW (ref 12.0–15.0)

## 2017-02-02 LAB — BASIC METABOLIC PANEL
ANION GAP: 8 (ref 5–15)
BUN: 20 mg/dL (ref 6–20)
CALCIUM: 8.2 mg/dL — AB (ref 8.9–10.3)
CO2: 21 mmol/L — ABNORMAL LOW (ref 22–32)
Chloride: 108 mmol/L (ref 101–111)
Creatinine, Ser: 1.05 mg/dL — ABNORMAL HIGH (ref 0.44–1.00)
GFR calc non Af Amer: 52 mL/min — ABNORMAL LOW (ref >60)
GFR, EST AFRICAN AMERICAN: 60 mL/min — AB (ref >60)
Glucose, Bld: 196 mg/dL — ABNORMAL HIGH (ref 65–99)
POTASSIUM: 4.3 mmol/L (ref 3.5–5.1)
Sodium: 137 mmol/L (ref 135–145)

## 2017-02-02 LAB — GLUCOSE, CAPILLARY
GLUCOSE-CAPILLARY: 368 mg/dL — AB (ref 65–99)
GLUCOSE-CAPILLARY: 330 mg/dL — AB (ref 65–99)
Glucose-Capillary: 195 mg/dL — ABNORMAL HIGH (ref 65–99)
Glucose-Capillary: 200 mg/dL — ABNORMAL HIGH (ref 65–99)

## 2017-02-02 LAB — CBC
HCT: 22.4 % — ABNORMAL LOW (ref 36.0–46.0)
HEMOGLOBIN: 7.2 g/dL — AB (ref 12.0–15.0)
MCH: 28.1 pg (ref 26.0–34.0)
MCHC: 32.1 g/dL (ref 30.0–36.0)
MCV: 87.5 fL (ref 78.0–100.0)
Platelets: 219 10*3/uL (ref 150–400)
RBC: 2.56 MIL/uL — ABNORMAL LOW (ref 3.87–5.11)
RDW: 14.5 % (ref 11.5–15.5)
WBC: 24.5 10*3/uL — ABNORMAL HIGH (ref 4.0–10.5)

## 2017-02-02 LAB — URINE CULTURE

## 2017-02-02 LAB — PREPARE RBC (CROSSMATCH)

## 2017-02-02 MED ORDER — IPRATROPIUM-ALBUTEROL 0.5-2.5 (3) MG/3ML IN SOLN
3.0000 mL | RESPIRATORY_TRACT | Status: DC | PRN
  Administered 2017-02-04: 3 mL via RESPIRATORY_TRACT
  Filled 2017-02-02: qty 3

## 2017-02-02 MED ORDER — GABAPENTIN 100 MG PO CAPS
200.0000 mg | ORAL_CAPSULE | Freq: Three times a day (TID) | ORAL | Status: DC
Start: 2017-02-02 — End: 2017-02-10
  Administered 2017-02-02 – 2017-02-09 (×15): 200 mg via ORAL
  Filled 2017-02-02 (×17): qty 2

## 2017-02-02 MED ORDER — SULFAMETHOXAZOLE-TRIMETHOPRIM 200-40 MG/5ML PO SUSP
20.0000 mL | Freq: Two times a day (BID) | ORAL | Status: DC
  Administered 2017-02-02 – 2017-02-04 (×5): 20 mL via ORAL
  Filled 2017-02-02 (×7): qty 20

## 2017-02-02 MED ORDER — SULFAMETHOXAZOLE-TRIMETHOPRIM 800-160 MG PO TABS
1.0000 | ORAL_TABLET | Freq: Two times a day (BID) | ORAL | Status: DC
  Filled 2017-02-02: qty 1

## 2017-02-02 MED ORDER — ACETAMINOPHEN 325 MG PO TABS
650.0000 mg | ORAL_TABLET | Freq: Four times a day (QID) | ORAL | Status: DC
  Administered 2017-02-02 – 2017-02-04 (×7): 650 mg via ORAL
  Filled 2017-02-02 (×10): qty 2

## 2017-02-02 MED ORDER — FUROSEMIDE 10 MG/ML IJ SOLN
20.0000 mg | Freq: Once | INTRAMUSCULAR | Status: AC
  Administered 2017-02-02: 20 mg via INTRAVENOUS
  Filled 2017-02-02: qty 2

## 2017-02-02 MED ORDER — SULFAMETHOXAZOLE-TRIMETHOPRIM 400-80 MG/5ML IV SOLN
160.0000 mg | Freq: Two times a day (BID) | INTRAVENOUS | Status: DC
  Filled 2017-02-02 (×2): qty 10

## 2017-02-02 MED ORDER — IPRATROPIUM-ALBUTEROL 0.5-2.5 (3) MG/3ML IN SOLN
RESPIRATORY_TRACT | Status: AC
  Administered 2017-02-02: 20:00:00
  Filled 2017-02-02: qty 3

## 2017-02-02 MED ORDER — SODIUM CHLORIDE 0.9 % IV SOLN
Freq: Once | INTRAVENOUS | Status: AC
  Administered 2017-02-02: 11:00:00 via INTRAVENOUS

## 2017-02-02 MED ORDER — BOOST / RESOURCE BREEZE PO LIQD
1.0000 | Freq: Two times a day (BID) | ORAL | Status: DC
  Administered 2017-02-02 – 2017-02-04 (×4): 1 via ORAL
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
## 2017-02-02 NOTE — Social Work (Signed)
CSW completed SBIRT screening. No risk factors identified. No intervention needed at this time.  Keene Breath, LCSW Clinical Social Worker [phone]

## 2017-02-02 NOTE — Progress Notes (Signed)
Inpatient Diabetes Program Recommendations  AACE/ADA: New Consensus Statement on Inpatient Glycemic Control (2015)  Target Ranges:  Prepandial:   less than 140 mg/dL      Peak postprandial:   less than 180 mg/dL (1-2 hours)      Critically ill patients:  140 - 180 mg/dL   Lab Results  Component Value Date   GLUCAP 200 (H) 02/02/2017   HGBA1C (H) 02/19/2009    10.2 (NOTE) The ADA recommends the following therapeutic goal for glycemic control related to Hgb A1c measurement: Goal of therapy: <6.5 Hgb A1c  Reference: American Diabetes Association: Clinical Practice Recommendations 2010, Diabetes Care, 2010, 33: (Suppl  1).    Review of Glycemic ControlResults for Tara, [name] (MRN [id number]) as of 02/02/2017 12:57  Ref. Range 02/01/2017 08:16 02/01/2017 11:29 02/01/2017 16:25 02/02/2017 07:48 02/02/2017 12:02  Glucose-Capillary Latest Ref Range: 65 - 99 mg/dL 409 (H) 811 (H) 914 (H) 195 (H) 200 (H)   Diabetes history: Type 2 diabetes Outpatient Diabetes medications: Tresiba 30 units daily  Current orders for Inpatient glycemic control:  Novolog moderate tid with meals  Inpatient Diabetes Program Recommendations:   Please consider adding Lantus 15 units daily (1/2 of home basal insulin).  Thanks, Beryl Meager, RN, BC-ADM Inpatient Diabetes Coordinator Pager [phone] (8a-5p)

## 2017-02-03 LAB — COMPREHENSIVE METABOLIC PANEL
ALK PHOS: 81 U/L (ref 38–126)
ALT: 26 U/L (ref 14–54)
AST: 56 U/L — AB (ref 15–41)
Albumin: 2.6 g/dL — ABNORMAL LOW (ref 3.5–5.0)
Anion gap: 7 (ref 5–15)
BILIRUBIN TOTAL: 0.9 mg/dL (ref 0.3–1.2)
BUN: 22 mg/dL — AB (ref 6–20)
CALCIUM: 8.4 mg/dL — AB (ref 8.9–10.3)
CHLORIDE: 106 mmol/L (ref 101–111)
CO2: 24 mmol/L (ref 22–32)
CREATININE: 0.97 mg/dL (ref 0.44–1.00)
GFR, EST NON AFRICAN AMERICAN: 57 mL/min — AB (ref >60)
Glucose, Bld: 251 mg/dL — ABNORMAL HIGH (ref 65–99)
Potassium: 3.9 mmol/L (ref 3.5–5.1)
Sodium: 137 mmol/L (ref 135–145)
TOTAL PROTEIN: 6.6 g/dL (ref 6.5–8.1)

## 2017-02-03 LAB — CBC
HCT: 25.6 % — ABNORMAL LOW (ref 36.0–46.0)
Hemoglobin: 8.4 g/dL — ABNORMAL LOW (ref 12.0–15.0)
MCH: 28.3 pg (ref 26.0–34.0)
MCHC: 32.8 g/dL (ref 30.0–36.0)
MCV: 86.2 fL (ref 78.0–100.0)
PLATELETS: 301 10*3/uL (ref 150–400)
RBC: 2.97 MIL/uL — ABNORMAL LOW (ref 3.87–5.11)
RDW: 15.4 % (ref 11.5–15.5)
WBC: 25.8 10*3/uL — AB (ref 4.0–10.5)

## 2017-02-03 LAB — GLUCOSE, CAPILLARY
GLUCOSE-CAPILLARY: 231 mg/dL — AB (ref 65–99)
GLUCOSE-CAPILLARY: 292 mg/dL — AB (ref 65–99)
Glucose-Capillary: 243 mg/dL — ABNORMAL HIGH (ref 65–99)
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
## 2017-02-04 ENCOUNTER — Inpatient Hospital Stay (HOSPITAL_COMMUNITY): Payer: No Typology Code available for payment source

## 2017-02-04 LAB — TYPE AND SCREEN
ABO/RH(D): B POS
Antibody Screen: NEGATIVE
UNIT DIVISION: 0
Unit division: 0

## 2017-02-04 LAB — BPAM RBC
BLOOD PRODUCT EXPIRATION DATE: 201810062359
Blood Product Expiration Date: 201810062359
ISSUE DATE / TIME: 201809281007
UNIT TYPE AND RH: 7300
Unit Type and Rh: 7300

## 2017-02-04 LAB — BLOOD GAS, ARTERIAL
ACID-BASE DEFICIT: 4 mmol/L — AB (ref 0.0–2.0)
BICARBONATE: 21.4 mmol/L (ref 20.0–28.0)
Drawn by: 505221
O2 CONTENT: 2 L/min
O2 Saturation: 86.5 %
PATIENT TEMPERATURE: 98.6
pCO2 arterial: 45.1 mmHg (ref 32.0–48.0)
pH, Arterial: 7.299 — ABNORMAL LOW (ref 7.350–7.450)
pO2, Arterial: 57 mmHg — ABNORMAL LOW (ref 83.0–108.0)

## 2017-02-04 MED ORDER — FUROSEMIDE 10 MG/ML IJ SOLN
INTRAMUSCULAR | Status: AC
  Administered 2017-02-04: 10 mg
  Filled 2017-02-04: qty 8

## 2017-02-04 MED ORDER — OXYCODONE-ACETAMINOPHEN 5-325 MG PO TABS
1.0000 | ORAL_TABLET | ORAL | Status: DC | PRN
  Administered 2017-02-04: 1 via ORAL
  Filled 2017-02-04 (×2): qty 1

## 2017-02-04 MED ORDER — FUROSEMIDE 10 MG/ML IJ SOLN
20.0000 mg | Freq: Once | INTRAMUSCULAR | Status: AC
  Administered 2017-02-04: 20 mg via INTRAVENOUS
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
## 2017-02-04 NOTE — Progress Notes (Signed)
Pt's family asked for help in the room pt was very short of breath, labored breath with coarse breath sounds, I page the doctor he ordered bi pap and chest x-ray, raspatory put on the bi pap I called the results of the x-ray to the doctor and continued to monitor.

## 2017-02-05 ENCOUNTER — Inpatient Hospital Stay (HOSPITAL_COMMUNITY): Payer: No Typology Code available for payment source

## 2017-02-05 ENCOUNTER — Other Ambulatory Visit (HOSPITAL_COMMUNITY): Payer: Medicare HMO

## 2017-02-05 DIAGNOSIS — I509 Heart failure, unspecified: Secondary | ICD-10-CM

## 2017-02-05 LAB — BLOOD GAS, ARTERIAL
Acid-base deficit: 0.4 mmol/L (ref 0.0–2.0)
BICARBONATE: 22 mmol/L (ref 20.0–28.0)
DRAWN BY: 347621
Delivery systems: POSITIVE
EXPIRATORY PAP: 6
FIO2: 70
Inspiratory PAP: 12
Mode: POSITIVE
O2 Saturation: 98.7 %
PATIENT TEMPERATURE: 98.6
PCO2 ART: 26.1 mmHg — AB (ref 32.0–48.0)
PO2 ART: 113 mmHg — AB (ref 83.0–108.0)
PRESSURE SUPPORT: 6 cmH2O
pH, Arterial: 7.536 — ABNORMAL HIGH (ref 7.350–7.450)

## 2017-02-05 LAB — CBC
HEMATOCRIT: 30.6 % — AB (ref 36.0–46.0)
HEMOGLOBIN: 10.2 g/dL — AB (ref 12.0–15.0)
MCH: 28.5 pg (ref 26.0–34.0)
MCHC: 33.3 g/dL (ref 30.0–36.0)
MCV: 85.5 fL (ref 78.0–100.0)
Platelets: 298 10*3/uL (ref 150–400)
RBC: 3.58 MIL/uL — ABNORMAL LOW (ref 3.87–5.11)
RDW: 14.9 % (ref 11.5–15.5)
WBC: 29 10*3/uL — ABNORMAL HIGH (ref 4.0–10.5)

## 2017-02-05 LAB — BASIC METABOLIC PANEL
Anion gap: 13 (ref 5–15)
BUN: 21 mg/dL — ABNORMAL HIGH (ref 6–20)
CALCIUM: 8.5 mg/dL — AB (ref 8.9–10.3)
CHLORIDE: 107 mmol/L (ref 101–111)
CO2: 18 mmol/L — AB (ref 22–32)
CREATININE: 0.86 mg/dL (ref 0.44–1.00)
GFR calc non Af Amer: >60 mL/min (ref >60)
GLUCOSE: 234 mg/dL — AB (ref 65–99)
Potassium: 4.2 mmol/L (ref 3.5–5.1)
Sodium: 138 mmol/L (ref 135–145)

## 2017-02-05 LAB — GLUCOSE, CAPILLARY: Glucose-Capillary: 197 mg/dL — ABNORMAL HIGH (ref 65–99)

## 2017-02-05 MED ORDER — ORAL CARE MOUTH RINSE
15.0000 mL | Freq: Two times a day (BID) | OROMUCOSAL | Status: DC
Start: 2017-02-06 — End: 2017-02-09
  Administered 2017-02-06 – 2017-02-09 (×6): 15 mL via OROMUCOSAL

## 2017-02-05 MED ORDER — POTASSIUM CHLORIDE IN NACL 20-0.9 MEQ/L-% IV SOLN
INTRAVENOUS | Status: DC
  Administered 2017-02-05: 12:00:00 via INTRAVENOUS
  Filled 2017-02-05: qty 1000

## 2017-02-05 MED ORDER — FUROSEMIDE 10 MG/ML IJ SOLN
40.0000 mg | Freq: Two times a day (BID) | INTRAMUSCULAR | Status: DC
  Administered 2017-02-05: 40 mg via INTRAVENOUS
  Filled 2017-02-05: qty 4

## 2017-02-05 MED ORDER — ACETAMINOPHEN 10 MG/ML IV SOLN
1000.0000 mg | Freq: Four times a day (QID) | INTRAVENOUS | Status: AC
  Administered 2017-02-06 (×4): 1000 mg via INTRAVENOUS
  Filled 2017-02-05 (×4): qty 100

## 2017-02-05 MED ORDER — LEVOFLOXACIN IN D5W 500 MG/100ML IV SOLN
500.0000 mg | INTRAVENOUS | Status: DC
  Administered 2017-02-05 – 2017-02-08 (×4): 500 mg via INTRAVENOUS
  Filled 2017-02-05 (×6): qty 100

## 2017-02-05 MED ORDER — WHITE PETROLATUM GEL
Status: AC
Start: 2017-02-05 — End: 2017-02-05
  Administered 2017-02-05: 06:00:00
  Filled 2017-02-05: qty 1

## 2017-02-05 MED ORDER — METOPROLOL TARTRATE 5 MG/5ML IV SOLN
5.0000 mg | Freq: Four times a day (QID) | INTRAVENOUS | Status: DC
  Administered 2017-02-06 – 2017-02-07 (×6): 5 mg via INTRAVENOUS
  Filled 2017-02-05 (×6): qty 5

## 2017-02-05 MED ORDER — FUROSEMIDE 10 MG/ML IJ SOLN
40.0000 mg | Freq: Once | INTRAMUSCULAR | Status: AC
  Administered 2017-02-05: 40 mg via INTRAVENOUS
  Filled 2017-02-05: qty 4

## 2017-02-05 MED ORDER — ENOXAPARIN SODIUM 40 MG/0.4ML ~~LOC~~ SOLN
40.0000 mg | SUBCUTANEOUS | Status: DC
  Administered 2017-02-06 – 2017-02-10 (×5): 40 mg via SUBCUTANEOUS
  Filled 2017-02-05 (×5): qty 0.4

## 2017-02-05 MED ORDER — IPRATROPIUM-ALBUTEROL 0.5-2.5 (3) MG/3ML IN SOLN
3.0000 mL | Freq: Four times a day (QID) | RESPIRATORY_TRACT | Status: DC
  Administered 2017-02-05 – 2017-02-07 (×9): 3 mL via RESPIRATORY_TRACT
  Filled 2017-02-05 (×10): qty 3

## 2017-02-05 MED ORDER — DEXTROSE 5 % IV SOLN
4.0000 mg/h | INTRAVENOUS | Status: DC
  Administered 2017-02-05 – 2017-02-08 (×2): 4 mg/h via INTRAVENOUS
  Filled 2017-02-05 (×4): qty 25

## 2017-02-05 MED ORDER — MORPHINE SULFATE (PF) 4 MG/ML IV SOLN
1.0000 mg | INTRAVENOUS | Status: DC | PRN
  Administered 2017-02-06 – 2017-02-07 (×2): 2 mg via INTRAVENOUS
  Administered 2017-02-07: 1 mg via INTRAVENOUS
  Administered 2017-02-08: 2 mg via INTRAVENOUS
  Filled 2017-02-05 (×4): qty 1

## 2017-02-05 MED ORDER — CHLORHEXIDINE GLUCONATE 0.12 % MT SOLN
15.0000 mL | Freq: Two times a day (BID) | OROMUCOSAL | Status: DC
  Administered 2017-02-05 – 2017-02-09 (×10): 15 mL via OROMUCOSAL
  Filled 2017-02-05 (×6): qty 15

## 2017-02-05 NOTE — Progress Notes (Signed)
Unable to obtain arterial blood.   Two RTs stuck multiple times in radial and brachial areas.  Informed DR Lindie Spruce.  He said we could try again in the am.
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
## 2017-02-05 NOTE — Progress Notes (Signed)
RT called to assess patients Spo2 and increased WOB.  Upon arrival patient is on BIPAP 40% Spo2 at 86%, increased to 50%, 60% and then to 70% to maintain Spo2 between 93-95%.  ABG drawn. Coarse crackles noted.
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
## 2017-02-05 NOTE — Progress Notes (Signed)
Inpatient Diabetes Program Recommendations  AACE/ADA: New Consensus Statement on Inpatient Glycemic Control (2015)  Target Ranges:  Prepandial:   less than 140 mg/dL      Peak postprandial:   less than 180 mg/dL (1-2 hours)      Critically ill patients:  140 - 180 mg/dL   Results for VIKKI, GAINS (MRN [id number]) as of 02/05/2017 13:55  Ref. Range 02/02/2017 07:33 02/03/2017 07:05 02/05/2017 10:36  Glucose Latest Ref Range: 65 - 99 mg/dL 409 (H) 811 (H) 914 (H)   Review of Glycemic Control  Diabetes history: DM2 Outpatient Diabetes medications: Tresiba 30 units daily Current orders for Inpatient glycemic control: Novolog 0-15 units TID with meals  Inpatient Diabetes Program Recommendations:  Insulin - Basal: Please consider ordering Lantus 10 units Q24H starting now (based on 64.5 kg x 0.15 units). Correction (SSI): Please change frequency of CBGs and Novolog correction to Q4H since patient is NPO.  Thanks, Orlando Penner, RN, MSN, CDE Diabetes Coordinator Inpatient Diabetes Program [phone] (Team Pager from 8am to 5pm)

## 2017-02-05 NOTE — Care Management Important Message (Signed)
Important Message  Patient Details  Name: [name] MRN: [id number] Date of Birth: 1946/05/07   Medicare Important Message Given:  Yes    Kyla Balzarine 02/05/2017, 9:41 AM
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
## 2017-02-05 NOTE — Progress Notes (Signed)
Pt on BIPAP Spo2 dropping to 87-89. RR 40-50's with increased WOB. RT, Rapid Response, and MD notified. PT later transferred to 4N. Report given to receiving Nurse. Gregor Hams, RN

## 2017-02-05 NOTE — Progress Notes (Signed)
She looks more comfortable now, although her HR remains elevated and her RR is still 30. Sats high 90s. Continue to monitor, low threshold for intubation.
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
## 2017-02-05 NOTE — Clinical Social Work Note (Signed)
Clinical Social Worker continuing to follow patient and family for support and discharge planning needs.  Patient currently on BiPap, however therapies have recommended SNF placement at discharge.  Patient family previously agreeable with placement, therefore CSW has initiated search.  CSW remains available for support and to facilitate patient discharge needs once medically stable.  Macario Golds, [place] [phone]

## 2017-02-05 NOTE — Progress Notes (Signed)
Trialed pt off BiPap after about 30 min pt Respiration increased 30's saturations were normal but pt had increased work of breathing placed on BiPAP will continue to monitor.

## 2017-02-05 NOTE — Progress Notes (Signed)
Pt unable to expectorate sputum at this time for a culture at this time.

## 2017-02-05 NOTE — Progress Notes (Signed)
Pt. Off BIPAP at this time for medication pass.  RN at bedside. Will placed back on BIPAP as needed. [employer].gl of pain but no distress noted.

## 2017-02-05 NOTE — Progress Notes (Addendum)
Pt has been confused most of the night, dosen't think she is in the hospital will continue to monitor. Also again trialed pt off BiPap within 30 min's pt respirations were in the 30's placed pt back on Bipap @ 40 percent

## 2017-02-05 NOTE — Progress Notes (Signed)
PT Cancellation Note  Patient Details Name: [name] MRN: [id number] DOB: 1945-11-25   Cancelled Treatment:    Reason Eval/Treat Not Completed: Medical issues which prohibited therapy Pt with HR 125 at rest and on bipap. PT will continue to follow acutely.    Derek Mound, PTA Pager: [phone]   02/05/2017, 3:34 PM

## 2017-02-05 NOTE — Progress Notes (Signed)
BIPAP resting on BIPAP. Weaned Fio2 to 40%.

## 2017-02-06 ENCOUNTER — Other Ambulatory Visit (HOSPITAL_COMMUNITY): Payer: Medicare HMO

## 2017-02-06 ENCOUNTER — Inpatient Hospital Stay (HOSPITAL_COMMUNITY): Payer: No Typology Code available for payment source

## 2017-02-06 DIAGNOSIS — J90 Pleural effusion, not elsewhere classified: Secondary | ICD-10-CM

## 2017-02-06 LAB — BASIC METABOLIC PANEL
ANION GAP: 11 (ref 5–15)
BUN: 25 mg/dL — AB (ref 6–20)
CO2: 22 mmol/L (ref 22–32)
Calcium: 8.2 mg/dL — ABNORMAL LOW (ref 8.9–10.3)
Chloride: 108 mmol/L (ref 101–111)
Creatinine, Ser: 1.14 mg/dL — ABNORMAL HIGH (ref 0.44–1.00)
GFR calc Af Amer: 54 mL/min — ABNORMAL LOW (ref >60)
GFR calc non Af Amer: 47 mL/min — ABNORMAL LOW (ref >60)
GLUCOSE: 214 mg/dL — AB (ref 65–99)
POTASSIUM: 3.5 mmol/L (ref 3.5–5.1)
Sodium: 141 mmol/L (ref 135–145)

## 2017-02-06 LAB — CBC
HCT: 24.7 % — ABNORMAL LOW (ref 36.0–46.0)
HEMATOCRIT: 25.8 % — AB (ref 36.0–46.0)
HEMOGLOBIN: 8.1 g/dL — AB (ref 12.0–15.0)
Hemoglobin: 7.9 g/dL — ABNORMAL LOW (ref 12.0–15.0)
MCH: 27.8 pg (ref 26.0–34.0)
MCH: 27.5 pg (ref 26.0–34.0)
MCHC: 32 g/dL (ref 30.0–36.0)
MCHC: 31.4 g/dL (ref 30.0–36.0)
MCV: 87 fL (ref 78.0–100.0)
MCV: 87.5 fL (ref 78.0–100.0)
PLATELETS: 369 10*3/uL (ref 150–400)
Platelets: 397 10*3/uL (ref 150–400)
RBC: 2.84 MIL/uL — AB (ref 3.87–5.11)
RBC: 2.95 MIL/uL — ABNORMAL LOW (ref 3.87–5.11)
RDW: 14.8 % (ref 11.5–15.5)
RDW: 14.9 % (ref 11.5–15.5)
WBC: 32.6 10*3/uL — ABNORMAL HIGH (ref 4.0–10.5)
WBC: 33.5 10*3/uL — AB (ref 4.0–10.5)

## 2017-02-06 LAB — PHOSPHORUS: PHOSPHORUS: 2.3 mg/dL — AB (ref 2.5–4.6)

## 2017-02-06 LAB — GLUCOSE, CAPILLARY
GLUCOSE-CAPILLARY: 290 mg/dL — AB (ref 65–99)
Glucose-Capillary: 300 mg/dL — ABNORMAL HIGH (ref 65–99)
Glucose-Capillary: 246 mg/dL — ABNORMAL HIGH (ref 65–99)
Glucose-Capillary: 199 mg/dL — ABNORMAL HIGH (ref 65–99)

## 2017-02-06 LAB — URINE CULTURE

## 2017-02-06 LAB — MAGNESIUM: Magnesium: 1.7 mg/dL (ref 1.7–2.4)

## 2017-02-06 MED ORDER — WHITE PETROLATUM GEL
Status: AC
  Administered 2017-02-06: 1
  Filled 2017-02-06: qty 1

## 2017-02-06 MED ORDER — CHLORHEXIDINE GLUCONATE CLOTH 2 % EX PADS
6.0000 | MEDICATED_PAD | Freq: Every day | CUTANEOUS | Status: DC
  Administered 2017-02-06 – 2017-02-09 (×4): 6 via TOPICAL

## 2017-02-06 MED ORDER — SODIUM CHLORIDE 0.9% FLUSH: 10.0000 mL | INTRAVENOUS | Status: DC | PRN

## 2017-02-06 MED ORDER — SODIUM CHLORIDE 0.9% FLUSH
10.0000 mL | Freq: Two times a day (BID) | INTRAVENOUS | Status: DC
  Administered 2017-02-06: 20 mL
  Administered 2017-02-07 – 2017-02-09 (×5): 10 mL
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
## 2017-02-06 NOTE — Progress Notes (Signed)
Trauma MD notified regarding patient drop in Hgb from 10.2 to 7.9. Order to repeat CBC at 0700 obtained. Patient has been on Lasix gtt since transfer and has had a total urine output of . Upon last bladder scan volume totaled . Continuing to hold off on foley catheter, as long as [place] is in place and working. Dr. Fredricka Bonine made aware and continuing to monitor.
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
## 2017-02-06 NOTE — Progress Notes (Signed)
Inpatient Diabetes Program Recommendations  AACE/ADA: New Consensus Statement on Inpatient Glycemic Control (2015)  Target Ranges:  Prepandial:   less than 140 mg/dL      Peak postprandial:   less than 180 mg/dL (1-2 hours)      Critically ill patients:  140 - 180 mg/dL   Lab Results  Component Value Date   GLUCAP 290 (H) 02/06/2017   HGBA1C (H) 02/19/2009    10.2 (NOTE) The ADA recommends the following therapeutic goal for glycemic control related to Hgb A1c measurement: Goal of therapy: <6.5 Hgb A1c  Reference: American Diabetes Association: Clinical Practice Recommendations 2010, Diabetes Care, 2010, 33: (Suppl  1).    Review of Glycemic Control  Results for [name], [name] (MRN [id number]) as of 02/06/2017 11:12  Ref. Range 02/03/2017 07:37 02/03/2017 11:33 02/03/2017 17:26 02/05/2017 23:35 02/06/2017 08:41  Glucose-Capillary Latest Ref Range: 65 - 99 mg/dL 696 (H) 295 (H) 284 (H) 197 (H) 290 (H)   Diabetes history: DM2 Outpatient Diabetes medications: Tresiba 30 units daily Current orders for Inpatient glycemic control: Novolog 0-15 units TID with meals  Inpatient Diabetes Program Recommendations:  Please consider ordering Lantus 14 units Q24H starting now (based on 68.9 kg x 0.20 units). Please change frequency of CBGs and Novolog correction to Q4H since patient is NPO.   Susette Racer, RN, BA, MHA, CDE Diabetes Coordinator Inpatient Diabetes Program  [phone] (Team Pager) [phone] [facility] Office) 02/06/2017 11:22 AM

## 2017-02-06 NOTE — Progress Notes (Signed)
Peripherally Inserted Central Catheter/Midline Placement  The IV Nurse has discussed with the patient and/or persons authorized to consent for the patient, the purpose of this procedure and the potential benefits and risks involved with this procedure.  The benefits include less needle sticks, lab draws from the catheter, and the patient may be discharged home with the catheter. Risks include, but not limited to, infection, bleeding, blood clot (thrombus formation), and puncture of an artery; nerve damage and irregular heartbeat and possibility to perform a PICC exchange if needed/ordered by physician.  Alternatives to this procedure were also discussed.  Bard Power PICC patient education guide, fact sheet on infection prevention and patient information card has been provided to patient /or left at bedside.    PICC/Midline Placement Documentation  PICC Double Lumen 02/06/17 PICC Right Brachial 38 cm 2 cm (Active)  Indication for Insertion or Continuance of Line Prolonged intravenous therapies 02/06/2017  7:00 PM  Exposed Catheter (cm) 2 cm 02/06/2017  7:00 PM  Site Assessment Clean;Dry;Intact 02/06/2017  7:00 PM  Lumen #1 Status Flushed;Saline locked;Blood return noted 02/06/2017  7:00 PM  Lumen #2 Status Flushed;Saline locked;Blood return noted 02/06/2017  7:00 PM  Dressing Type Transparent;Securing device 02/06/2017  7:00 PM  Dressing Change Due 02/13/17 02/06/2017  7:00 PM       Romie Jumper 02/06/2017, 7:12 PM
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
## 2017-02-06 NOTE — Progress Notes (Signed)
SLP Cancellation Note  Patient Details Name: AMMI HUTT MRN: [id number] DOB: Feb 17, 1946   Cancelled treatment:       Reason Eval/Treat Not Completed: Medical issues which prohibited therapy- pt on bipap.   [name] 02/06/2017, 1:42 PM

## 2017-02-07 ENCOUNTER — Inpatient Hospital Stay (HOSPITAL_COMMUNITY): Payer: No Typology Code available for payment source

## 2017-02-07 DIAGNOSIS — J189 Pneumonia, unspecified organism: Secondary | ICD-10-CM

## 2017-02-07 DIAGNOSIS — I340 Nonrheumatic mitral (valve) insufficiency: Secondary | ICD-10-CM

## 2017-02-07 LAB — BASIC METABOLIC PANEL
ANION GAP: 14 (ref 5–15)
BUN: 31 mg/dL — AB (ref 6–20)
CHLORIDE: 105 mmol/L (ref 101–111)
CO2: 24 mmol/L (ref 22–32)
Calcium: 8.5 mg/dL — ABNORMAL LOW (ref 8.9–10.3)
Creatinine, Ser: 0.96 mg/dL (ref 0.44–1.00)
GFR, EST NON AFRICAN AMERICAN: 58 mL/min — AB (ref >60)
Glucose, Bld: 273 mg/dL — ABNORMAL HIGH (ref 65–99)
POTASSIUM: 3.2 mmol/L — AB (ref 3.5–5.1)
SODIUM: 143 mmol/L (ref 135–145)

## 2017-02-07 LAB — CBC
HCT: 25.9 % — ABNORMAL LOW (ref 36.0–46.0)
HEMOGLOBIN: 8.3 g/dL — AB (ref 12.0–15.0)
MCH: 28.2 pg (ref 26.0–34.0)
MCHC: 32 g/dL (ref 30.0–36.0)
MCV: 88.1 fL (ref 78.0–100.0)
Platelets: 420 10*3/uL — ABNORMAL HIGH (ref 150–400)
RBC: 2.94 MIL/uL — AB (ref 3.87–5.11)
RDW: 15.1 % (ref 11.5–15.5)
WBC: 27.1 10*3/uL — ABNORMAL HIGH (ref 4.0–10.5)

## 2017-02-07 LAB — BLOOD GAS, ARTERIAL
ACID-BASE DEFICIT: 0.4 mmol/L (ref 0.0–2.0)
BICARBONATE: 23.1 mmol/L (ref 20.0–28.0)
Delivery systems: POSITIVE
Drawn by: 12971
EXPIRATORY PAP: 6
FIO2: 40
Inspiratory PAP: 12
O2 SAT: 94 %
PATIENT TEMPERATURE: 98.6
PH ART: 7.449 (ref 7.350–7.450)
PO2 ART: 70.5 mmHg — AB (ref 83.0–108.0)
pCO2 arterial: 33.9 mmHg (ref 32.0–48.0)

## 2017-02-07 LAB — GLUCOSE, CAPILLARY
Glucose-Capillary: 155 mg/dL — ABNORMAL HIGH (ref 65–99)
Glucose-Capillary: 253 mg/dL — ABNORMAL HIGH (ref 65–99)
Glucose-Capillary: 264 mg/dL — ABNORMAL HIGH (ref 65–99)
Glucose-Capillary: 288 mg/dL — ABNORMAL HIGH (ref 65–99)
Glucose-Capillary: 313 mg/dL — ABNORMAL HIGH (ref 65–99)

## 2017-02-07 LAB — ECHOCARDIOGRAM COMPLETE
Height: 62 in
Weight: 2430.35 oz

## 2017-02-07 LAB — MAGNESIUM: Magnesium: 1.8 mg/dL (ref 1.7–2.4)

## 2017-02-07 MED ORDER — METOPROLOL TARTRATE 25 MG PO TABS
12.5000 mg | ORAL_TABLET | Freq: Two times a day (BID) | ORAL | Status: DC
  Administered 2017-02-07 – 2017-02-08 (×2): 12.5 mg via ORAL
  Filled 2017-02-07 (×2): qty 1

## 2017-02-07 MED ORDER — INSULIN GLARGINE 100 UNIT/ML ~~LOC~~ SOLN
14.0000 [IU] | Freq: Every day | SUBCUTANEOUS | Status: DC
  Administered 2017-02-07 – 2017-02-08 (×2): 14 [IU] via SUBCUTANEOUS
  Filled 2017-02-07 (×2): qty 0.14

## 2017-02-07 MED ORDER — MAGNESIUM SULFATE 4 GM/100ML IV SOLN
4.0000 g | Freq: Once | INTRAVENOUS | Status: AC
  Administered 2017-02-07: 4 g via INTRAVENOUS
  Filled 2017-02-07: qty 100

## 2017-02-07 MED ORDER — INSULIN ASPART 100 UNIT/ML ~~LOC~~ SOLN
0.0000 [IU] | SUBCUTANEOUS | Status: DC
  Administered 2017-02-07: 11 [IU] via SUBCUTANEOUS
  Administered 2017-02-07 – 2017-02-08 (×2): 4 [IU] via SUBCUTANEOUS
  Administered 2017-02-08: 3 [IU] via SUBCUTANEOUS
  Administered 2017-02-08: 4 [IU] via SUBCUTANEOUS
  Administered 2017-02-08: 7 [IU] via SUBCUTANEOUS
  Administered 2017-02-08: 4 [IU] via SUBCUTANEOUS
  Administered 2017-02-08: 11 [IU] via SUBCUTANEOUS
  Administered 2017-02-09: 4 [IU] via SUBCUTANEOUS

## 2017-02-07 MED ORDER — POTASSIUM CHLORIDE 10 MEQ/100ML IV SOLN
10.0000 meq | INTRAVENOUS | Status: AC
  Administered 2017-02-07 (×3): 10 meq via INTRAVENOUS
  Filled 2017-02-07 (×3): qty 100

## 2017-02-07 MED ORDER — RESOURCE THICKENUP CLEAR PO POWD
Freq: Once | ORAL | Status: AC
  Administered 2017-02-07: 17:00:00 via ORAL
  Filled 2017-02-07: qty 125
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
## 2017-02-07 NOTE — Progress Notes (Signed)
RT note-Patient changed to HFNC, per Dr. Lindie Spruce, patient is tolerating well at this time.
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
## 2017-02-07 NOTE — Plan of Care (Signed)
Problem: Fluid Volume: Goal: Ability to maintain a balanced intake and output will improve Outcome: Not Progressing Patient remained on BiPap throughout the day and night. Unable to tolerate POs. SLP set for 10/3.
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
## 2017-02-07 NOTE — Progress Notes (Signed)
Inpatient Diabetes Program Recommendations  AACE/ADA: New Consensus Statement on Inpatient Glycemic Control (2015)  Target Ranges:  Prepandial:   less than 140 mg/dL      Peak postprandial:   less than 180 mg/dL (1-2 hours)      Critically ill patients:  140 - 180 mg/dL   Results for [name], [name] (MRN [id number]) as of 02/07/2017 11:39  Ref. Range 02/06/2017 08:41 02/06/2017 12:08 02/06/2017 16:51 02/06/2017 21:45 02/07/2017 08:35  Glucose-Capillary Latest Ref Range: 65 - 99 mg/dL 811 (H) 914 (H) 782 (H) 199 (H) 313 (H)    Review of Glycemic Control Outpatient DM medications: Tresiba 30 units daily Current orders for Inpatient glycemic control: Novolog 0-15 units TID with meals  Inpatient Diabetes Program Recommendations:  Insulin - Basal: Please consider ordering Lantus 14 units Q24H starting now (based on 68.9 kg x 0.2 units). Correction (SSI): Please change frequency of CBGs and Novolog correction to Q4H since patient is NPO. If appropriate, please consider changing to ICU Glycemic Control order set to improve inpatient glycemic control.  Thanks, Orlando Penner, RN, MSN, CDE Diabetes Coordinator Inpatient Diabetes Program [phone] (Team Pager from 8am to 5pm)
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
## 2017-02-07 NOTE — Progress Notes (Signed)
  Echocardiogram 2D Echocardiogram has been performed.  [name] 02/07/2017, 1:57 PM

## 2017-02-08 DIAGNOSIS — I4729 Other ventricular tachycardia: Secondary | ICD-10-CM

## 2017-02-08 DIAGNOSIS — I472 Ventricular tachycardia: Secondary | ICD-10-CM

## 2017-02-08 DIAGNOSIS — I480 Paroxysmal atrial fibrillation: Secondary | ICD-10-CM

## 2017-02-08 DIAGNOSIS — I5021 Acute systolic (congestive) heart failure: Secondary | ICD-10-CM | POA: Diagnosis present

## 2017-02-08 LAB — GLUCOSE, CAPILLARY
GLUCOSE-CAPILLARY: 99 mg/dL (ref 65–99)
GLUCOSE-CAPILLARY: 131 mg/dL — AB (ref 65–99)
GLUCOSE-CAPILLARY: 160 mg/dL — AB (ref 65–99)
GLUCOSE-CAPILLARY: 219 mg/dL — AB (ref 65–99)
GLUCOSE-CAPILLARY: 190 mg/dL — AB (ref 65–99)
Glucose-Capillary: 154 mg/dL — ABNORMAL HIGH (ref 65–99)

## 2017-02-08 LAB — BASIC METABOLIC PANEL
Anion gap: 11 (ref 5–15)
BUN: 33 mg/dL — AB (ref 6–20)
CO2: 29 mmol/L (ref 22–32)
Calcium: 8.5 mg/dL — ABNORMAL LOW (ref 8.9–10.3)
Chloride: 109 mmol/L (ref 101–111)
Creatinine, Ser: 0.97 mg/dL (ref 0.44–1.00)
GFR calc Af Amer: >60 mL/min (ref >60)
GFR, EST NON AFRICAN AMERICAN: 57 mL/min — AB (ref >60)
GLUCOSE: 92 mg/dL (ref 65–99)
Potassium: 3.3 mmol/L — ABNORMAL LOW (ref 3.5–5.1)
Sodium: 149 mmol/L — ABNORMAL HIGH (ref 135–145)

## 2017-02-08 LAB — CBC
HEMATOCRIT: 23.5 % — AB (ref 36.0–46.0)
HEMOGLOBIN: 7.5 g/dL — AB (ref 12.0–15.0)
MCH: 28.2 pg (ref 26.0–34.0)
MCHC: 31.9 g/dL (ref 30.0–36.0)
MCV: 88.3 fL (ref 78.0–100.0)
Platelets: 367 10*3/uL (ref 150–400)
RBC: 2.66 MIL/uL — ABNORMAL LOW (ref 3.87–5.11)
RDW: 15.7 % — AB (ref 11.5–15.5)
WBC: 21.5 10*3/uL — AB (ref 4.0–10.5)

## 2017-02-08 LAB — MAGNESIUM: Magnesium: 2.4 mg/dL (ref 1.7–2.4)

## 2017-02-08 MED ORDER — IPRATROPIUM-ALBUTEROL 0.5-2.5 (3) MG/3ML IN SOLN
3.0000 mL | Freq: Three times a day (TID) | RESPIRATORY_TRACT | Status: DC
  Administered 2017-02-08 (×2): 3 mL via RESPIRATORY_TRACT
  Filled 2017-02-08 (×2): qty 3

## 2017-02-08 MED ORDER — IPRATROPIUM-ALBUTEROL 0.5-2.5 (3) MG/3ML IN SOLN
3.0000 mL | RESPIRATORY_TRACT | Status: DC | PRN
Start: 2017-02-08 — End: 2017-02-19
  Administered 2017-02-10: 3 mL via RESPIRATORY_TRACT
  Filled 2017-02-08: qty 3

## 2017-02-08 MED ORDER — OXYCODONE HCL 5 MG PO TABS
5.0000 mg | ORAL_TABLET | ORAL | Status: DC | PRN
  Administered 2017-02-12 – 2017-02-13 (×4): 5 mg via ORAL
  Administered 2017-02-13: 10 mg via ORAL
  Administered 2017-02-14 – 2017-02-18 (×9): 5 mg via ORAL
  Filled 2017-02-08 (×4): qty 1
  Filled 2017-02-08: qty 2
  Filled 2017-02-08 (×9): qty 1

## 2017-02-08 MED ORDER — TRAMADOL HCL 50 MG PO TABS
50.0000 mg | ORAL_TABLET | Freq: Four times a day (QID) | ORAL | Status: DC
Start: 2017-02-08 — End: 2017-02-10
  Administered 2017-02-08 – 2017-02-10 (×8): 50 mg via ORAL
  Filled 2017-02-08 (×8): qty 1

## 2017-02-08 MED ORDER — CARVEDILOL 6.25 MG PO TABS
6.2500 mg | ORAL_TABLET | Freq: Two times a day (BID) | ORAL | Status: DC
  Administered 2017-02-08 – 2017-02-10 (×3): 6.25 mg via ORAL
  Filled 2017-02-08: qty 1
  Filled 2017-02-08 (×3): qty 2
  Filled 2017-02-08: qty 1

## 2017-02-08 MED ORDER — POTASSIUM CHLORIDE CRYS ER 20 MEQ PO TBCR
40.0000 meq | EXTENDED_RELEASE_TABLET | Freq: Two times a day (BID) | ORAL | Status: DC
  Administered 2017-02-08 – 2017-02-10 (×5): 40 meq via ORAL
  Filled 2017-02-08 (×6): qty 2

## 2017-02-08 MED ORDER — FUROSEMIDE 40 MG PO TABS
40.0000 mg | ORAL_TABLET | Freq: Every day | ORAL | Status: DC
  Administered 2017-02-09 – 2017-02-10 (×2): 40 mg via ORAL
  Filled 2017-02-08 (×2): qty 1

## 2017-02-08 MED ORDER — POTASSIUM CHLORIDE 10 MEQ/50ML IV SOLN
10.0000 meq | INTRAVENOUS | Status: AC
  Administered 2017-02-08 (×2): 10 meq via INTRAVENOUS
  Filled 2017-02-08 (×2): qty 50

## 2017-02-08 NOTE — Progress Notes (Signed)
SLP Cancellation Note  Patient Details Name: [name] MRN: [id number] DOB: [birth year]   Cancelled treatment:       Reason Eval/Treat Not Completed: Other (comment) Pt sleeping soundly.  Spoke with daughter re: plan for POs; modified orders so that pt may resume ice chips after oral care.  SLP will f/u next date.    [name] 02/08/2017, 3:56 PM

## 2017-02-08 NOTE — Progress Notes (Signed)
Chaplain met with PT and family that was present.  This was an introductory visit and the PT requested prayer.  The PT made good progress when compared to yesterday.  There was great excitement in the room given the state of the PT.  She even laughed and joked with the PT
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
## 2017-02-08 NOTE — Clinical Social Work Note (Signed)
Clinical Social Worker met with patient and patient daughter at bedside to offer continued support and discuss discharge planning needs.  Patient and daughter both request placement at [facility], as patient husband has been there in the past.  CSW contacted facility who is able to extend a bed offer and will submit for insurance authorization for next week.  CSW spoke with therapies who state that patient frequency is 5x a week and will remain there in order to have updated clinical information for insurance authorization.  Patient and family aware of bed offer and initiation of insurance for placement.  CSW remains available for support and to facilitate patient discharge needs once medically stable.  Barbette Or, [place]
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
## 2017-02-09 ENCOUNTER — Inpatient Hospital Stay (HOSPITAL_COMMUNITY): Payer: No Typology Code available for payment source

## 2017-02-09 LAB — BASIC METABOLIC PANEL
ANION GAP: 7 (ref 5–15)
BUN: 33 mg/dL — ABNORMAL HIGH (ref 6–20)
CALCIUM: 8.7 mg/dL — AB (ref 8.9–10.3)
CO2: 31 mmol/L (ref 22–32)
CREATININE: 0.91 mg/dL (ref 0.44–1.00)
Chloride: 110 mmol/L (ref 101–111)
Glucose, Bld: 144 mg/dL — ABNORMAL HIGH (ref 65–99)
Potassium: 4.4 mmol/L (ref 3.5–5.1)
SODIUM: 148 mmol/L — AB (ref 135–145)

## 2017-02-09 LAB — GLUCOSE, CAPILLARY
GLUCOSE-CAPILLARY: 111 mg/dL — AB (ref 65–99)
GLUCOSE-CAPILLARY: 175 mg/dL — AB (ref 65–99)
GLUCOSE-CAPILLARY: 211 mg/dL — AB (ref 65–99)
GLUCOSE-CAPILLARY: 104 mg/dL — AB (ref 65–99)
Glucose-Capillary: 195 mg/dL — ABNORMAL HIGH (ref 65–99)

## 2017-02-09 LAB — CBC
HEMATOCRIT: 27.5 % — AB (ref 36.0–46.0)
Hemoglobin: 8.1 g/dL — ABNORMAL LOW (ref 12.0–15.0)
MCH: 27 pg (ref 26.0–34.0)
MCHC: 29.5 g/dL — ABNORMAL LOW (ref 30.0–36.0)
MCV: 91.7 fL (ref 78.0–100.0)
Platelets: 420 10*3/uL — ABNORMAL HIGH (ref 150–400)
RBC: 3 MIL/uL — ABNORMAL LOW (ref 3.87–5.11)
RDW: 15.7 % — AB (ref 11.5–15.5)
WBC: 24 10*3/uL — AB (ref 4.0–10.5)

## 2017-02-09 LAB — MAGNESIUM: MAGNESIUM: 2.3 mg/dL (ref 1.7–2.4)

## 2017-02-09 MED ORDER — INSULIN GLARGINE 100 UNIT/ML ~~LOC~~ SOLN
14.0000 [IU] | Freq: Every day | SUBCUTANEOUS | Status: DC
  Administered 2017-02-09 – 2017-02-18 (×9): 14 [IU] via SUBCUTANEOUS
  Filled 2017-02-09 (×11): qty 0.14

## 2017-02-09 MED ORDER — LEVOFLOXACIN 500 MG PO TABS
500.0000 mg | ORAL_TABLET | ORAL | Status: DC
  Administered 2017-02-09 – 2017-02-18 (×9): 500 mg via ORAL
  Filled 2017-02-09 (×10): qty 1

## 2017-02-09 MED ORDER — INSULIN ASPART 100 UNIT/ML ~~LOC~~ SOLN
0.0000 [IU] | Freq: Three times a day (TID) | SUBCUTANEOUS | Status: DC
  Administered 2017-02-09: 5 [IU] via SUBCUTANEOUS
  Administered 2017-02-10 (×2): 2 [IU] via SUBCUTANEOUS

## 2017-02-09 MED ORDER — INSULIN ASPART 100 UNIT/ML ~~LOC~~ SOLN
4.0000 [IU] | Freq: Three times a day (TID) | SUBCUTANEOUS | Status: DC
  Administered 2017-02-09 – 2017-02-10 (×3): 4 [IU] via SUBCUTANEOUS

## 2017-02-09 MED ORDER — ASPIRIN 81 MG PO CHEW
81.0000 mg | CHEWABLE_TABLET | Freq: Every day | ORAL | Status: DC
  Administered 2017-02-09: 81 mg via ORAL
  Filled 2017-02-09: qty 1

## 2017-02-09 MED ORDER — MORPHINE SULFATE (PF) 4 MG/ML IV SOLN
1.0000 mg | INTRAVENOUS | Status: DC | PRN
  Administered 2017-02-14: 1 mg via INTRAVENOUS
  Administered 2017-02-15: 2 mg via INTRAVENOUS
  Filled 2017-02-09 (×2): qty 1

## 2017-02-09 MED ORDER — LOSARTAN POTASSIUM 50 MG PO TABS: 25.0000 mg | ORAL_TABLET | Freq: Every day | ORAL | Status: DC

## 2017-02-09 MED ORDER — ACETAMINOPHEN 325 MG PO TABS
650.0000 mg | ORAL_TABLET | Freq: Three times a day (TID) | ORAL | Status: DC
  Administered 2017-02-09 – 2017-02-19 (×28): 650 mg via ORAL
  Filled 2017-02-09 (×28): qty 2

## 2017-02-09 NOTE — Progress Notes (Signed)
Trauma Service Note  Subjective: Patient is awake and alert.  Sitting up eating breakfast on high-flow oxygen.  No BiPAP for > 24 hours.  Objective: Vital signs in last 24 hours: Temp:  [97.8 F (36.6 C)-98.5 F (36.9 C)] 98.3 F (36.8 C) (10/05 0300) Pulse Rate:  [78-109] 84 (10/05 0900) Resp:  [11-34] 24 (10/05 0900) BP: (96-140)/(53-77) 140/66 (10/05 0900) SpO2:  [94 %-100 %] 95 % (10/05 0900) Last BM Date: 02/08/17  Intake/Output from previous day: 10/04 0701 - 10/05 0700 In: 22.7 [I.V.:22.7] Out: 470 [Urine:470] Intake/Output this shift: Total I/O In: 10 [I.V.:10] Out: 200 [Urine:200]  General: No acute distress, but states that she has some heaviness in her right chest with deep inspiration.  Lungs: Clear to auscultation  Abd: Soft, benign  Extremities: No changes  Neuro: Intact  Lab Results: CBC   Recent Labs  02/08/17 0556 02/09/17 0400  WBC 21.5* 24.0*  HGB 7.5* 8.1*  HCT 23.5* 27.5*  PLT 367 420*   BMET  Recent Labs  02/08/17 0556 02/09/17 0400  NA 149* 148*  K 3.3* 4.4  CL 109 110  CO2 29 31  GLUCOSE 92 144*  BUN 33* 33*  CREATININE 0.97 0.91  CALCIUM 8.5* 8.7*   PT/INR No results for input(s): LABPROT, INR in the last 72 hours. ABG  Recent Labs  02/07/17 1051  PHART 7.449  HCO3 23.1    Studies/Results: No results found.  Anti-infectives: Anti-infectives    Start     Dose/Rate Route Frequency Ordered Stop   02/05/17 2100  levofloxacin (LEVAQUIN) IVPB 500 mg    Comments:  Pharmacy to adjust dosing if needed   500 mg 100 mL/hr over 60 Minutes Intravenous Every 24 hours 02/05/17 2038     02/02/17 1600  sulfamethoxazole-trimethoprim (BACTRIM,SEPTRA) 200-40 MG/5ML suspension 20 mL  Status:  Discontinued     20 mL Oral Every 12 hours 02/02/17 1450 02/05/17 2144   02/02/17 1315  sulfamethoxazole-trimethoprim (BACTRIM) 160 mg in dextrose 5 % 250 mL IVPB  Status:  Discontinued     160 mg 260 mL/hr over 60 Minutes Intravenous  Every 12 hours 02/02/17 1215 02/02/17 1450   02/02/17 1030  sulfamethoxazole-trimethoprim (BACTRIM DS,SEPTRA DS) 800-160 MG per tablet 1 tablet  Status:  Discontinued     1 tablet Oral Every 12 hours 02/02/17 1005 02/02/17 1215      Assessment/Plan: s/p  Advance diet  Transfer to SDU/54M  LOS: 10 days   Marta Lamas. Gae Bon, MD, FACS [phone] Trauma Surgeon 02/09/2017
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
## 2017-02-09 NOTE — Progress Notes (Signed)
Modified Barium Swallow Progress Note  Patient Details  Name: CORALINE TALWAR MRN: [id number] Date of Birth: 06/02/1945  Today's Date: 02/09/2017  Modified Barium Swallow completed.  Full report located under Chart Review in the Imaging Section.  Brief recommendations include the following:  Clinical Impression  Pt presents with functional oropharyngeal swallow marked by prolonged, but adequate, oral preparation; pharyngeal swallow initiation occurs at level of pyriforms for thin liquids, leading to high penetration (PAS #2) of thin liquids, which is ejected from laryngeal vestibule upon completion of the swallow.  No aspiration.  Also notable was prominent cricopharyngeus, which is asymptomatic at this time.  Recommend resuming a regular diet with thin liquids; continue meds whole in puree.  SLP will follow briefly to ensure adequate toleration/  Pt verbalizes agreement; pleased she can drink thin liquids.    Swallow Evaluation Recommendations       SLP Diet Recommendations: Regular solids;Thin liquid   Liquid Administration via: Cup;Straw   Medication Administration: Whole meds with puree   Supervision: Patient able to self feed           Oral Care Recommendations: Oral care BID        [name] 02/09/2017,11:46 AM
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
## 2017-02-09 NOTE — Clinical Social Work Note (Signed)
Clinical Social Worker continuing to follow patient and family for support and discharge planning needs.   Patient plans to discharge to [facility] and insurance authorization has been initiated by the facility.  CSW sent facility updated clinicals today.  Per MD, patient likely ready beginning of next week. CSW remains available for support and to facilitate patient discharge needs once medically stable.  Macario Golds, [place] [phone]
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
## 2017-02-09 NOTE — Progress Notes (Signed)
Pharmacy Communication Note:  This patient is receiving the Levaquin by the intravenous route. Based on criteria approved by the Pharmacy and Therapeutics Committee, and the Infectious Disease Division, the antibiotic(s) is / are being converted to equivalent oral dose form(s). These criteria include:  1) Patient being treated for a respiratory tract infection, urinary tract infection, cellulitis, or Clostridium Difficile Associated Diarrhea 2) The patient is not neutropenic and does not exhibit a GI malabsorption state 3) The patient is eating (either orally or per tube) and/or has been taking other orally administered medications for at least 24 hours. 4) The patient is improving clinically (physician assessment and a 24-hour Tmax of 100.5 F  If you have questions about this conversion, please contact the pharmacy department. Thank you.  Vinnie Level, PharmD., BCPS Clinical Pharmacist Pager [phone]

## 2017-02-10 ENCOUNTER — Inpatient Hospital Stay (HOSPITAL_COMMUNITY): Payer: No Typology Code available for payment source

## 2017-02-10 DIAGNOSIS — R6521 Severe sepsis with septic shock: Secondary | ICD-10-CM

## 2017-02-10 DIAGNOSIS — J9601 Acute respiratory failure with hypoxia: Secondary | ICD-10-CM

## 2017-02-10 DIAGNOSIS — I5021 Acute systolic (congestive) heart failure: Secondary | ICD-10-CM

## 2017-02-10 DIAGNOSIS — A419 Sepsis, unspecified organism: Secondary | ICD-10-CM

## 2017-02-10 DIAGNOSIS — N3000 Acute cystitis without hematuria: Secondary | ICD-10-CM

## 2017-02-10 DIAGNOSIS — J189 Pneumonia, unspecified organism: Secondary | ICD-10-CM

## 2017-02-10 LAB — CBC WITH DIFFERENTIAL/PLATELET
BASOS PCT: 0 %
Basophils Absolute: 0 10*3/uL (ref 0.0–0.1)
Eosinophils Absolute: 0.8 10*3/uL — ABNORMAL HIGH (ref 0.0–0.7)
Eosinophils Relative: 3 %
HCT: 26.3 % — ABNORMAL LOW (ref 36.0–46.0)
Hemoglobin: 8 g/dL — ABNORMAL LOW (ref 12.0–15.0)
LYMPHS ABS: 3.7 10*3/uL (ref 0.7–4.0)
Lymphocytes Relative: 14 %
MCH: 28.1 pg (ref 26.0–34.0)
MCHC: 30.4 g/dL (ref 30.0–36.0)
MCV: 92.3 fL (ref 78.0–100.0)
MONO ABS: 2.1 10*3/uL — AB (ref 0.1–1.0)
Monocytes Relative: 8 %
NEUTROS ABS: 19.9 10*3/uL — AB (ref 1.7–7.7)
Neutrophils Relative %: 75 %
Platelets: 426 10*3/uL — ABNORMAL HIGH (ref 150–400)
RBC: 2.85 MIL/uL — ABNORMAL LOW (ref 3.87–5.11)
RDW: 15.3 % (ref 11.5–15.5)
WBC: 26.5 10*3/uL — AB (ref 4.0–10.5)

## 2017-02-10 LAB — BLOOD GAS, ARTERIAL
ACID-BASE EXCESS: 2.3 mmol/L — AB (ref 0.0–2.0)
BICARBONATE: 26.4 mmol/L (ref 20.0–28.0)
Drawn by: 437071
O2 CONTENT: 3 L/min
O2 SAT: 94.7 %
PATIENT TEMPERATURE: 98.6
PO2 ART: 73.2 mmHg — AB (ref 83.0–108.0)
pCO2 arterial: 42.2 mmHg (ref 32.0–48.0)
pH, Arterial: 7.413 (ref 7.350–7.450)

## 2017-02-10 LAB — GLUCOSE, CAPILLARY
GLUCOSE-CAPILLARY: 122 mg/dL — AB (ref 65–99)
GLUCOSE-CAPILLARY: 106 mg/dL — AB (ref 65–99)
Glucose-Capillary: 141 mg/dL — ABNORMAL HIGH (ref 65–99)
Glucose-Capillary: 149 mg/dL — ABNORMAL HIGH (ref 65–99)
Glucose-Capillary: 195 mg/dL — ABNORMAL HIGH (ref 65–99)
Glucose-Capillary: 73 mg/dL (ref 65–99)

## 2017-02-10 LAB — COMPREHENSIVE METABOLIC PANEL
ALK PHOS: 130 U/L — AB (ref 38–126)
ALT: 14 U/L (ref 14–54)
AST: 20 U/L (ref 15–41)
Albumin: 2 g/dL — ABNORMAL LOW (ref 3.5–5.0)
Anion gap: 6 (ref 5–15)
BILIRUBIN TOTAL: 0.8 mg/dL (ref 0.3–1.2)
BUN: 45 mg/dL — ABNORMAL HIGH (ref 6–20)
CALCIUM: 8 mg/dL — AB (ref 8.9–10.3)
CO2: 26 mmol/L (ref 22–32)
CREATININE: 1.5 mg/dL — AB (ref 0.44–1.00)
Chloride: 104 mmol/L (ref 101–111)
GFR, EST AFRICAN AMERICAN: 39 mL/min — AB (ref >60)
GFR, EST NON AFRICAN AMERICAN: 34 mL/min — AB (ref >60)
Glucose, Bld: 142 mg/dL — ABNORMAL HIGH (ref 65–99)
Potassium: 6.6 mmol/L (ref 3.5–5.1)
Sodium: 136 mmol/L (ref 135–145)
TOTAL PROTEIN: 5.2 g/dL — AB (ref 6.5–8.1)

## 2017-02-10 LAB — PROCALCITONIN: PROCALCITONIN: 4.48 ng/mL

## 2017-02-10 LAB — MAGNESIUM: Magnesium: 2.1 mg/dL (ref 1.7–2.4)

## 2017-02-10 LAB — LACTIC ACID, PLASMA: LACTIC ACID, VENOUS: 1 mmol/L (ref 0.5–1.9)

## 2017-02-10 LAB — PHOSPHORUS: Phosphorus: 4.4 mg/dL (ref 2.5–4.6)

## 2017-02-10 LAB — TROPONIN I: TROPONIN I: 1.13 ng/mL — AB (ref <0.03)

## 2017-02-10 LAB — BRAIN NATRIURETIC PEPTIDE: B Natriuretic Peptide: 682.8 pg/mL — ABNORMAL HIGH (ref 0.0–100.0)

## 2017-02-10 MED ORDER — FUROSEMIDE 20 MG PO TABS
20.0000 mg | ORAL_TABLET | Freq: Every day | ORAL | Status: DC
  Filled 2017-02-10: qty 1

## 2017-02-10 MED ORDER — FUROSEMIDE 10 MG/ML IJ SOLN
20.0000 mg | Freq: Once | INTRAMUSCULAR | Status: AC
  Administered 2017-02-10: 20 mg via INTRAVENOUS

## 2017-02-10 MED ORDER — FUROSEMIDE 10 MG/ML IJ SOLN: 20.0000 mg | Freq: Once | INTRAMUSCULAR | Status: DC

## 2017-02-10 MED ORDER — SODIUM CHLORIDE 0.9 % IV SOLN
INTRAVENOUS | Status: DC
  Administered 2017-02-10 – 2017-02-13 (×4): via INTRAVENOUS

## 2017-02-10 MED ORDER — VENLAFAXINE HCL ER 75 MG PO CP24
150.0000 mg | ORAL_CAPSULE | Freq: Every day | ORAL | Status: DC
  Administered 2017-02-10 – 2017-02-19 (×10): 150 mg via ORAL
  Filled 2017-02-10 (×2): qty 2
  Filled 2017-02-10: qty 1
  Filled 2017-02-10: qty 2
  Filled 2017-02-10: qty 1
  Filled 2017-02-10 (×2): qty 2
  Filled 2017-02-10: qty 1
  Filled 2017-02-10 (×2): qty 2

## 2017-02-10 MED ORDER — VANCOMYCIN HCL IN DEXTROSE 1-5 GM/200ML-% IV SOLN
1000.0000 mg | INTRAVENOUS | Status: DC
  Administered 2017-02-12: 1000 mg via INTRAVENOUS
  Filled 2017-02-10: qty 200

## 2017-02-10 MED ORDER — VANCOMYCIN HCL 10 G IV SOLR
1750.0000 mg | Freq: Once | INTRAVENOUS | Status: AC
  Administered 2017-02-11: 1750 mg via INTRAVENOUS
  Filled 2017-02-10: qty 1750

## 2017-02-10 MED ORDER — ASPIRIN EC 325 MG PO TBEC
325.0000 mg | DELAYED_RELEASE_TABLET | Freq: Every day | ORAL | Status: DC
  Administered 2017-02-10: 325 mg via ORAL
  Filled 2017-02-10 (×2): qty 1

## 2017-02-10 MED ORDER — DOPAMINE-DEXTROSE 3.2-5 MG/ML-% IV SOLN
0.0000 ug/kg/min | INTRAVENOUS | Status: DC
  Administered 2017-02-10: 7 ug/kg/min via INTRAVENOUS
  Administered 2017-02-10: 10 ug/kg/min via INTRAVENOUS
  Filled 2017-02-10: qty 250

## 2017-02-10 MED ORDER — VITAMIN D 1000 UNITS PO TABS
1000.0000 [IU] | ORAL_TABLET | Freq: Two times a day (BID) | ORAL | Status: DC
  Administered 2017-02-10 – 2017-02-19 (×18): 1000 [IU] via ORAL
  Filled 2017-02-10 (×18): qty 1

## 2017-02-10 MED ORDER — SODIUM CHLORIDE 0.9 % IV BOLUS (SEPSIS)
500.0000 mL | Freq: Once | INTRAVENOUS | Status: AC
  Administered 2017-02-10: 500 mL via INTRAVENOUS

## 2017-02-10 MED ORDER — BISACODYL 10 MG RE SUPP
10.0000 mg | Freq: Every day | RECTAL | Status: DC | PRN
Start: 2017-02-10 — End: 2017-02-19

## 2017-02-10 MED ORDER — EZETIMIBE-SIMVASTATIN 10-40 MG PO TABS
1.0000 | ORAL_TABLET | Freq: Every day | ORAL | Status: DC
  Administered 2017-02-10 – 2017-02-19 (×10): 1 via ORAL
  Filled 2017-02-10 (×11): qty 1

## 2017-02-10 MED ORDER — VITAMIN C 500 MG PO TABS
500.0000 mg | ORAL_TABLET | Freq: Every day | ORAL | Status: DC
  Administered 2017-02-10: 500 mg via ORAL
  Filled 2017-02-10 (×2): qty 1

## 2017-02-10 MED ORDER — NOREPINEPHRINE BITARTRATE 1 MG/ML IV SOLN
0.0000 ug/min | INTRAVENOUS | Status: DC
  Administered 2017-02-10: 5 ug/min via INTRAVENOUS
  Filled 2017-02-10: qty 4

## 2017-02-10 MED ORDER — PIPERACILLIN-TAZOBACTAM 3.375 G IVPB 30 MIN
3.3750 g | Freq: Once | INTRAVENOUS | Status: AC
Start: 2017-02-10 — End: 2017-02-11
  Administered 2017-02-10: 3.375 g via INTRAVENOUS
  Filled 2017-02-10: qty 50

## 2017-02-10 MED ORDER — TRAMADOL HCL 50 MG PO TABS
50.0000 mg | ORAL_TABLET | Freq: Four times a day (QID) | ORAL | Status: DC | PRN
  Administered 2017-02-14: 50 mg via ORAL
  Filled 2017-02-10: qty 1

## 2017-02-10 MED ORDER — PIPERACILLIN-TAZOBACTAM 3.375 G IVPB
3.3750 g | Freq: Three times a day (TID) | INTRAVENOUS | Status: DC
  Administered 2017-02-11 – 2017-02-12 (×4): 3.375 g via INTRAVENOUS
  Filled 2017-02-10 (×5): qty 50

## 2017-02-10 MED ORDER — FUROSEMIDE 10 MG/ML IJ SOLN
INTRAMUSCULAR | Status: AC
  Filled 2017-02-10: qty 2

## 2017-02-10 MED ORDER — DEXTROSE 50 % IV SOLN
50.0000 mL | Freq: Once | INTRAVENOUS | Status: AC
  Administered 2017-02-10: 50 mL via INTRAVENOUS

## 2017-02-10 MED ORDER — LISINOPRIL 10 MG PO TABS
20.0000 mg | ORAL_TABLET | Freq: Two times a day (BID) | ORAL | Status: DC
  Administered 2017-02-10: 20 mg via ORAL
  Filled 2017-02-10: qty 1

## 2017-02-10 MED ORDER — DEXTROSE 50 % IV SOLN
INTRAVENOUS | Status: AC
  Filled 2017-02-10: qty 50

## 2017-02-10 MED ORDER — INSULIN ASPART 100 UNIT/ML IV SOLN
10.0000 [IU] | Freq: Once | INTRAVENOUS | Status: AC
  Administered 2017-02-10: 10 [IU] via INTRAVENOUS

## 2017-02-10 MED ORDER — ALUM & MAG HYDROXIDE-SIMETH 200-200-20 MG/5ML PO SUSP
30.0000 mL | ORAL | Status: DC | PRN
Start: 2017-02-10 — End: 2017-02-19
  Administered 2017-02-13: 30 mL via ORAL
  Filled 2017-02-10 (×2): qty 30

## 2017-02-10 MED ORDER — LEVOTHYROXINE SODIUM 50 MCG PO TABS
50.0000 ug | ORAL_TABLET | Freq: Every day | ORAL | Status: DC
  Administered 2017-02-10 – 2017-02-19 (×10): 50 ug via ORAL
  Filled 2017-02-10 (×12): qty 1

## 2017-02-10 MED ORDER — SODIUM CHLORIDE 0.9 % IV SOLN
1.0000 g | Freq: Once | INTRAVENOUS | Status: AC
  Administered 2017-02-11: 1 g via INTRAVENOUS
  Filled 2017-02-10: qty 10

## 2017-02-10 MED ORDER — ENOXAPARIN SODIUM 30 MG/0.3ML ~~LOC~~ SOLN
30.0000 mg | SUBCUTANEOUS | Status: DC
  Administered 2017-02-11 – 2017-02-13 (×3): 30 mg via SUBCUTANEOUS
  Filled 2017-02-10 (×3): qty 0.3

## 2017-02-10 MED ORDER — INSULIN ASPART 100 UNIT/ML ~~LOC~~ SOLN
1.0000 [IU] | SUBCUTANEOUS | Status: DC
  Administered 2017-02-11 – 2017-02-12 (×6): 2 [IU] via SUBCUTANEOUS

## 2017-02-10 NOTE — Progress Notes (Signed)
Called [facility] about pt condition.  Respiratory in room assessing pt.  Texted Dr.White to call so I can tell him about pt.

## 2017-02-10 NOTE — Progress Notes (Signed)
Pt received from 6n at 2040 BP 77/44, critical troponin and potassium called to Dr. Cliffton Asters and cardiology MD at the bedside, also RRT at the bedside Dopamine started 500 cc bolus given accepting nurse at the bedside report given. Daughter called and made aware of transfer
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
## 2017-02-10 NOTE — Progress Notes (Signed)
Talked with Dr. Cliffton Asters about BP 87/41 and if wanted parameters for the lisinopril which was started yesterday.  Parameters to hold if SBP less than 130.  Will continue to monitor BP.

## 2017-02-10 NOTE — Progress Notes (Signed)
RT placed pt on BIPAP. 15/6 with a set rate of 8 and 40% O2. Pt VS are stable but pt is uneasily aroused and unresponsive. Pt is due to transfer back to ICU. RT will cont to monitor.
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
## 2017-02-10 NOTE — Progress Notes (Signed)
Pt sleepy and is very congested.  Mouth care done and tried to suction orally but could not get anything.  Gave IS to try for deep breathing.  Called Resp
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
## 2017-02-10 NOTE — Significant Event (Signed)
Called by RN re: pt on cardiology consult service who was hyoptensive and decreased level of arrousal.  Assessed patient to find an elderly female on Bipap with SBPs in the 70s-80s.  Patient would open eyes to loud voice and touch, but not follow commands.  Extremities were warm, JVP did not appear elevated.  Performed a bedside TTE to evaluate volume status and for signs of an effusion.  No effusion seen in PSLAX, PSAX, 4Ch, 2Ch or subxiphoid views.  IVC appeared to be spontaneously collapsing with inspiration.  Labs show downtrending troponin, elevated K.  Given the warm extremities, collapsing IVC on TTE and clinical picture, suspect hypotension is not cardiac in origin.  Advised team to transfer to ICU for higher level of care, A line and pressor administration.  Started empiric dopamine through indwelling PICC to acutely treat hypotension.  RN in communciation with primary team who facilitated transfer to MICU.  Call if there are additional questions or concerns if cardiology can be of assistance.  Azzie Glatter, MD Cardiology fellow

## 2017-02-10 NOTE — Progress Notes (Signed)
  Report given for [facility], Respiratory will meet her there to set up BiPap.

## 2017-02-10 NOTE — Progress Notes (Signed)
ABG results noted

## 2017-02-10 NOTE — Progress Notes (Signed)
Received pt from [facility], sleepy, BP 87/41.  Left arm swollen, will put up on pillow and DC NSL.  R PICC capped. O2 at 3L N/C.  Sats=95% on 3L.  Oriented to room.  Bed alarm on.

## 2017-02-10 NOTE — Progress Notes (Signed)
Dr. Cliffton Asters here and pt having PCXR done at present.  Dr. Cliffton Asters talking with daughter.  Pt to be transferred to higher level of care for BiPap.

## 2017-02-10 NOTE — Progress Notes (Signed)
CXR results noted.  IV team here drawing labs and EKG being done.  Pt to be transferred to [facility].

## 2017-02-10 NOTE — Progress Notes (Addendum)
Pt with some sob; denies chest pain. Daughter notes her lungs sound 'very wet.' Has used bipap in past this admission, particularly at night. Known Klebsiella PNA, on Levaquin  Afebrile, vitals normal; sats 90-92% on 3L Coarse BS - crackles and ronchi bilaterally; R > L RRR  PLAN -Transfer to stepdown -Full set labs, abg, bnp -EKG/CXR -  IV Lasix -Pulmonary toilet; bipap  Stephanie Coup. Cliffton Asters, M.D. [facility]

## 2017-02-10 NOTE — Progress Notes (Signed)
Called per floor RN regarding Pt with congestion for the last 2 hrs. 02 sats 92% on 3 LNC. RT at bedside completing DuoNeb. Spoke with RT, Pt not in distress but wet rhonchi heard in bilateral upper lobes, recommends Bipap. Advised to page Trauma MD to bedside while RRT en route. Upon my arrival Pt resting and Trauma MD at bedside. Orders placed for SDU transfer. RN advised to notify RRT for worsening prior to transfer. RRT night to follow.

## 2017-02-10 NOTE — Progress Notes (Signed)
Gave pt Duoneb resp treatment for congestion.  Sats 90-93 % on 3L.  BP 87/49, pulse 62.  Daughter at bedside.

## 2017-02-11 ENCOUNTER — Inpatient Hospital Stay (HOSPITAL_COMMUNITY): Payer: No Typology Code available for payment source

## 2017-02-11 DIAGNOSIS — R061 Stridor: Secondary | ICD-10-CM

## 2017-02-11 LAB — BASIC METABOLIC PANEL
ANION GAP: 8 (ref 5–15)
ANION GAP: 4 — AB (ref 5–15)
BUN: 42 mg/dL — AB (ref 6–20)
BUN: 33 mg/dL — ABNORMAL HIGH (ref 6–20)
CALCIUM: 8 mg/dL — AB (ref 8.9–10.3)
CALCIUM: 7.3 mg/dL — AB (ref 8.9–10.3)
CO2: 24 mmol/L (ref 22–32)
CO2: 23 mmol/L (ref 22–32)
CREATININE: 1.09 mg/dL — AB (ref 0.44–1.00)
Chloride: 103 mmol/L (ref 101–111)
Chloride: 112 mmol/L — ABNORMAL HIGH (ref 101–111)
Creatinine, Ser: 1.37 mg/dL — ABNORMAL HIGH (ref 0.44–1.00)
GFR, EST AFRICAN AMERICAN: 43 mL/min — AB (ref >60)
GFR, EST AFRICAN AMERICAN: 57 mL/min — AB (ref >60)
GFR, EST NON AFRICAN AMERICAN: 38 mL/min — AB (ref >60)
GFR, EST NON AFRICAN AMERICAN: 49 mL/min — AB (ref >60)
GLUCOSE: 269 mg/dL — AB (ref 65–99)
GLUCOSE: 168 mg/dL — AB (ref 65–99)
Potassium: 5.7 mmol/L — ABNORMAL HIGH (ref 3.5–5.1)
Potassium: 4.5 mmol/L (ref 3.5–5.1)
SODIUM: 135 mmol/L (ref 135–145)
Sodium: 139 mmol/L (ref 135–145)

## 2017-02-11 LAB — CBC WITH DIFFERENTIAL/PLATELET
Basophils Absolute: 0 10*3/uL (ref 0.0–0.1)
Basophils Relative: 0 %
EOS PCT: 2 %
Eosinophils Absolute: 0.6 10*3/uL (ref 0.0–0.7)
HEMATOCRIT: 26.9 % — AB (ref 36.0–46.0)
Hemoglobin: 7.9 g/dL — ABNORMAL LOW (ref 12.0–15.0)
Lymphocytes Relative: 9 %
Lymphs Abs: 2.7 10*3/uL (ref 0.7–4.0)
MCH: 27 pg (ref 26.0–34.0)
MCHC: 29.4 g/dL — ABNORMAL LOW (ref 30.0–36.0)
MCV: 91.8 fL (ref 78.0–100.0)
MONO ABS: 2.7 10*3/uL — AB (ref 0.1–1.0)
Monocytes Relative: 9 %
NEUTROS PCT: 80 %
Neutro Abs: 23.7 10*3/uL — ABNORMAL HIGH (ref 1.7–7.7)
PLATELETS: 452 10*3/uL — AB (ref 150–400)
RBC: 2.93 MIL/uL — AB (ref 3.87–5.11)
RDW: 14.8 % (ref 11.5–15.5)
WBC: 29.7 10*3/uL — AB (ref 4.0–10.5)

## 2017-02-11 LAB — MAGNESIUM: MAGNESIUM: 2.1 mg/dL (ref 1.7–2.4)

## 2017-02-11 LAB — LIPID PANEL
CHOL/HDL RATIO: 4.8 ratio
Cholesterol: 86 mg/dL (ref 0–200)
HDL: 18 mg/dL — AB (ref >40)
LDL CALC: 23 mg/dL (ref 0–99)
Triglycerides: 226 mg/dL — ABNORMAL HIGH (ref <150)
VLDL: 45 mg/dL — AB (ref 0–40)

## 2017-02-11 LAB — POCT I-STAT 3, ART BLOOD GAS (G3+)
BICARBONATE: 24.5 mmol/L (ref 20.0–28.0)
O2 SAT: 96 %
PCO2 ART: 37.9 mmHg (ref 32.0–48.0)
Patient temperature: 98.6
TCO2: 26 mmol/L (ref 22–32)
pH, Arterial: 7.417 (ref 7.350–7.450)
pO2, Arterial: 84 mmHg (ref 83.0–108.0)

## 2017-02-11 LAB — GLUCOSE, CAPILLARY
GLUCOSE-CAPILLARY: 187 mg/dL — AB (ref 65–99)
Glucose-Capillary: 190 mg/dL — ABNORMAL HIGH (ref 65–99)
Glucose-Capillary: 169 mg/dL — ABNORMAL HIGH (ref 65–99)

## 2017-02-11 LAB — PHOSPHORUS: Phosphorus: 4.4 mg/dL (ref 2.5–4.6)

## 2017-02-11 LAB — LACTIC ACID, PLASMA: Lactic Acid, Venous: 1 mmol/L (ref 0.5–1.9)

## 2017-02-11 MED ORDER — ORAL CARE MOUTH RINSE
15.0000 mL | Freq: Two times a day (BID) | OROMUCOSAL | Status: DC
  Administered 2017-02-11 – 2017-02-19 (×12): 15 mL via OROMUCOSAL

## 2017-02-11 MED ORDER — CHLORHEXIDINE GLUCONATE 0.12 % MT SOLN
15.0000 mL | Freq: Two times a day (BID) | OROMUCOSAL | Status: DC
  Administered 2017-02-11 – 2017-02-19 (×13): 15 mL via OROMUCOSAL
  Filled 2017-02-11 (×11): qty 15

## 2017-02-11 MED ORDER — INSULIN ASPART 100 UNIT/ML ~~LOC~~ SOLN
20.0000 [IU] | Freq: Once | SUBCUTANEOUS | Status: AC
  Administered 2017-02-11: 20 [IU] via INTRAVENOUS

## 2017-02-11 MED ORDER — ASPIRIN EC 81 MG PO TBEC
81.0000 mg | DELAYED_RELEASE_TABLET | Freq: Every day | ORAL | Status: DC
  Administered 2017-02-11 – 2017-02-19 (×9): 81 mg via ORAL
  Filled 2017-02-11 (×9): qty 1

## 2017-02-11 MED ORDER — DEXTROSE 50 % IV SOLN
1.0000 | Freq: Once | INTRAVENOUS | Status: AC
  Administered 2017-02-11: 50 mL via INTRAVENOUS
  Filled 2017-02-11: qty 50

## 2017-02-11 MED ORDER — CARVEDILOL 3.125 MG PO TABS
3.1250 mg | ORAL_TABLET | Freq: Two times a day (BID) | ORAL | Status: DC
  Administered 2017-02-12 – 2017-02-19 (×13): 3.125 mg via ORAL
  Filled 2017-02-11 (×15): qty 1

## 2017-02-11 MED ORDER — FUROSEMIDE 20 MG PO TABS
20.0000 mg | ORAL_TABLET | Freq: Every day | ORAL | Status: DC
  Administered 2017-02-12 – 2017-02-16 (×5): 20 mg via ORAL
  Filled 2017-02-11 (×5): qty 1
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
## 2017-02-11 NOTE — Progress Notes (Addendum)
Went to see patient for follow up after moving to 4E, upon arrival, patient was already on BIPAP, very lethargic, hypotensive, RN had already called TRAUMA MD and patient will be moving to ICU and CCM was consulted. HR was in the 50s, once dopamine started improved to 90-110, BP also improved into the 100-120s.   Interventions: Dopamine drip was started, along with 500cc NS bolus, foley placed, labs/ABG/EKG,CXR ordered.   Patient taken to 60M with MICU RN who was assuming care. Patient tolerated transfer.   End Time 2300
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
## 2017-02-11 NOTE — Progress Notes (Signed)
eLink Physician-Brief Progress Note Patient Name: [name] DOB: February 21, 1946 MRN: [id number]   Date of Service  02/11/2017  HPI/Events of Note  K+ = 6.6 --> 5.7. EKG on bedside monitor without QRS widening or tall T waves.   eICU Interventions  Will order: 1. D/C scheduled  KCl replacement. 2. Repeat BMP at 12 Noon.      Intervention Category Major Interventions: Electrolyte abnormality - evaluation and management  Sommer,Steven Eugene 02/11/2017, 5:15 AM
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
## 2017-02-12 LAB — CBC WITH DIFFERENTIAL/PLATELET
BASOS ABS: 0 10*3/uL (ref 0.0–0.1)
BASOS PCT: 0 %
Eosinophils Absolute: 0.7 10*3/uL (ref 0.0–0.7)
Eosinophils Relative: 3 %
HCT: 22.9 % — ABNORMAL LOW (ref 36.0–46.0)
Hemoglobin: 7 g/dL — ABNORMAL LOW (ref 12.0–15.0)
LYMPHS PCT: 7 %
Lymphs Abs: 1.7 10*3/uL (ref 0.7–4.0)
MCH: 28.1 pg (ref 26.0–34.0)
MCHC: 30.6 g/dL (ref 30.0–36.0)
MCV: 92 fL (ref 78.0–100.0)
MONO ABS: 1.9 10*3/uL — AB (ref 0.1–1.0)
Monocytes Relative: 8 %
NEUTROS ABS: 19.6 10*3/uL — AB (ref 1.7–7.7)
Neutrophils Relative %: 82 %
PLATELETS: 407 10*3/uL — AB (ref 150–400)
RBC: 2.49 MIL/uL — ABNORMAL LOW (ref 3.87–5.11)
RDW: 15.2 % (ref 11.5–15.5)
WBC: 23.9 10*3/uL — ABNORMAL HIGH (ref 4.0–10.5)

## 2017-02-12 LAB — BASIC METABOLIC PANEL
Anion gap: 8 (ref 5–15)
Anion gap: 5 (ref 5–15)
BUN: 32 mg/dL — AB (ref 6–20)
BUN: 25 mg/dL — AB (ref 6–20)
CALCIUM: 7.5 mg/dL — AB (ref 8.9–10.3)
CO2: 24 mmol/L (ref 22–32)
CO2: 25 mmol/L (ref 22–32)
CREATININE: 1.12 mg/dL — AB (ref 0.44–1.00)
CREATININE: 0.99 mg/dL (ref 0.44–1.00)
Calcium: 7.7 mg/dL — ABNORMAL LOW (ref 8.9–10.3)
Chloride: 106 mmol/L (ref 101–111)
Chloride: 109 mmol/L (ref 101–111)
GFR calc Af Amer: 55 mL/min — ABNORMAL LOW (ref >60)
GFR calc Af Amer: >60 mL/min (ref >60)
GFR, EST NON AFRICAN AMERICAN: 48 mL/min — AB (ref >60)
GFR, EST NON AFRICAN AMERICAN: 56 mL/min — AB (ref >60)
GLUCOSE: 202 mg/dL — AB (ref 65–99)
GLUCOSE: 108 mg/dL — AB (ref 65–99)
POTASSIUM: 4 mmol/L (ref 3.5–5.1)
Potassium: 4.4 mmol/L (ref 3.5–5.1)
SODIUM: 138 mmol/L (ref 135–145)
SODIUM: 139 mmol/L (ref 135–145)

## 2017-02-12 LAB — CBC
HEMATOCRIT: 28.6 % — AB (ref 36.0–46.0)
HEMOGLOBIN: 8.6 g/dL — AB (ref 12.0–15.0)
MCH: 27.4 pg (ref 26.0–34.0)
MCHC: 30.1 g/dL (ref 30.0–36.0)
MCV: 91.1 fL (ref 78.0–100.0)
Platelets: 434 10*3/uL — ABNORMAL HIGH (ref 150–400)
RBC: 3.14 MIL/uL — ABNORMAL LOW (ref 3.87–5.11)
RDW: 15.1 % (ref 11.5–15.5)
WBC: 22 10*3/uL — ABNORMAL HIGH (ref 4.0–10.5)

## 2017-02-12 LAB — GLUCOSE, CAPILLARY
GLUCOSE-CAPILLARY: 155 mg/dL — AB (ref 65–99)
GLUCOSE-CAPILLARY: 91 mg/dL (ref 65–99)
Glucose-Capillary: 177 mg/dL — ABNORMAL HIGH (ref 65–99)
Glucose-Capillary: 103 mg/dL — ABNORMAL HIGH (ref 65–99)
Glucose-Capillary: 102 mg/dL — ABNORMAL HIGH (ref 65–99)

## 2017-02-12 LAB — PATHOLOGIST SMEAR REVIEW

## 2017-02-12 LAB — PREPARE RBC (CROSSMATCH)

## 2017-02-12 MED ORDER — INSULIN ASPART 100 UNIT/ML ~~LOC~~ SOLN
0.0000 [IU] | Freq: Three times a day (TID) | SUBCUTANEOUS | Status: DC
  Administered 2017-02-12 – 2017-02-14 (×4): 3 [IU] via SUBCUTANEOUS
  Administered 2017-02-15: 5 [IU] via SUBCUTANEOUS
  Administered 2017-02-15 (×2): 2 [IU] via SUBCUTANEOUS
  Administered 2017-02-16: 3 [IU] via SUBCUTANEOUS
  Administered 2017-02-16: 2 [IU] via SUBCUTANEOUS
  Administered 2017-02-17 (×2): 3 [IU] via SUBCUTANEOUS
  Administered 2017-02-18 (×2): 2 [IU] via SUBCUTANEOUS
  Administered 2017-02-18: 5 [IU] via SUBCUTANEOUS
  Administered 2017-02-19: 3 [IU] via SUBCUTANEOUS
  Administered 2017-02-19: 2 [IU] via SUBCUTANEOUS

## 2017-02-12 NOTE — Progress Notes (Signed)
Trauma Service Note  Subjective: Patient is awake and alert and tallatove/  No distress  Objective: Vital signs in last 24 hours: Temp:  [97.9 F (36.6 C)-99.1 F (37.3 C)] 98.2 F (36.8 C) (10/08 0748) Pulse Rate:  [52-104] 95 (10/08 0300) Resp:  [15-30] 20 (10/08 0300) BP: (82-123)/(35-72) 97/61 (10/08 0300) SpO2:  [93 %-100 %] 96 % (10/08 0300) Last BM Date: 02/08/17  Intake/Output from previous day: 10/07 0701 - 10/08 0700 In: 1624.2 [P.O.:410; I.V.:1114.2; IV Piggyback:100] Out: 1651 [Urine:1650; Stool:1] Intake/Output this shift: No intake/output data recorded.  General: No acute distress  Lungs: Clear in upper regions.  Oxygen saturations 97% on 2L by [name]  Abd: Soft, benign, good bowel sounds.  Extremities: No changes.  Some edema in upper extremities  Neuro: Intact  Lab Results: CBC   Recent Labs  02/11/17 0130 02/12/17 0230  WBC 29.7* 23.9*  HGB 7.9* 7.0*  HCT 26.9* 22.9*  PLT 452* 407*   BMET  Recent Labs  02/11/17 1530 02/12/17 0230  NA 139 138  K 4.5 4.4  CL 112* 106  CO2 23 24  GLUCOSE 168* 202*  BUN 33* 32*  CREATININE 1.09* 1.12*  CALCIUM 7.3* 7.5*   PT/INR No results for input(s): LABPROT, INR in the last 72 hours. ABG  Recent Labs  02/10/17 1920 02/11/17 0825  PHART 7.413 7.417  HCO3 26.4 24.5    Studies/Results: Dg Chest Port 1 View  Result Date: 02/11/2017 CLINICAL DATA:  Follow-up exam.  Pneumonia.  Trauma. EXAM: PORTABLE CHEST 1 VIEW COMPARISON:  02/10/2017 and older exams including chest CT, 01/30/2017. FINDINGS: Hazy airspace and interstitial type opacities in the mid lungs with more confluent opacity at the bases is similar to the most recent prior exam. Bilateral pleural effusions appear stable from the most recent prior exam. No new lung abnormalities.  No pneumothorax. The left-sided rib fractures are again noted. Right PICC is stable. IMPRESSION: 1. No change from the most recent prior exam. There has been  improvement in lung aeration on the right over the last week. 2. Persistent bilateral pleural effusions. Similar-appearing interstitial and hazy airspace lung opacities which may reflect atelectasis, edema or multifocal infection or combination. Electronically Signed   By: Amie Portland M.D.   On: 02/11/2017 07:38   Dg Chest Port 1 View  Result Date: 02/10/2017 CLINICAL DATA:  Pneumonia, history hypertension, diabetes mellitus, coronary artery disease post CABG, paroxysmal atrial fibrillation EXAM: PORTABLE CHEST 1 VIEW COMPARISON:  Portable exam 1840 hours compared to 02/07/2017 FINDINGS: RIGHT arm PICC line tip projects over cavoatrial junction. Enlargement of cardiac silhouette post CABG. Pulmonary vascular congestion. BILATERAL perihilar infiltrates compatible with pulmonary edema. Bibasilar effusions and atelectasis. No pneumothorax. Bones demineralized. IMPRESSION: CHF with small bibasilar pleural effusions and basilar atelectasis slightly greater on LEFT. Electronically Signed   By: Ulyses Southward M.D.   On: 02/10/2017 19:04    Anti-infectives: Anti-infectives    Start     Dose/Rate Route Frequency Ordered Stop   02/12/17 0000  vancomycin (VANCOCIN) IVPB 1000 mg/200 mL premix  Status:  Discontinued     1,000 mg 200 mL/hr over 60 Minutes Intravenous Every 24 hours 02/10/17 2317 02/12/17 0747   02/11/17 0600  piperacillin-tazobactam (ZOSYN) IVPB 3.375 g  Status:  Discontinued     3.375 g 12.5 mL/hr over 240 Minutes Intravenous Every 8 hours 02/10/17 2317 02/12/17 0747   02/10/17 2330  vancomycin (VANCOCIN) 1,750 mg in sodium chloride 0.9 % 500 mL IVPB  1,750 mg 250 mL/hr over 120 Minutes Intravenous  Once 02/10/17 2317 02/11/17 0213   02/10/17 2330  piperacillin-tazobactam (ZOSYN) IVPB 3.375 g     3.375 g 100 mL/hr over 30 Minutes Intravenous  Once 02/10/17 2317 02/11/17 0010   02/09/17 2200  levofloxacin (LEVAQUIN) tablet 500 mg     500 mg Oral Every 24 hours 02/09/17 1015     02/05/17  2100  levofloxacin (LEVAQUIN) IVPB 500 mg  Status:  Discontinued    Comments:  Pharmacy to adjust dosing if needed   500 mg 100 mL/hr over 60 Minutes Intravenous Every 24 hours 02/05/17 2038 02/09/17 1015   02/02/17 1600  sulfamethoxazole-trimethoprim (BACTRIM,SEPTRA) 200-40 MG/5ML suspension 20 mL  Status:  Discontinued     20 mL Oral Every 12 hours 02/02/17 1450 02/05/17 2144   02/02/17 1315  sulfamethoxazole-trimethoprim (BACTRIM) 160 mg in dextrose 5 % 250 mL IVPB  Status:  Discontinued     160 mg 260 mL/hr over 60 Minutes Intravenous Every 12 hours 02/02/17 1215 02/02/17 1450   02/02/17 1030  sulfamethoxazole-trimethoprim (BACTRIM DS,SEPTRA DS) 800-160 MG per tablet 1 tablet  Status:  Discontinued     1 tablet Oral Every 12 hours 02/02/17 1005 02/02/17 1215      Assessment/Plan: s/p  Hemoglobin is down to 7.0.  Will transfuse one unit of PRBCs.  Transfer to SDU  LOS: 13 days   Marta Lamas. Gae Bon, MD, FACS [phone] Trauma Surgeon 02/12/2017
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
## 2017-02-12 NOTE — Clinical Social Work Note (Signed)
Clinical Social Worker continuing to follow patient and family for support and discharge planning needs.  Patient is making gradual improvements and has orders to move to SDU.  Patient with available bed at [facility] and facility pursuing authorization.  CSW remains available for support and to facilitate patient discharge needs once medically stable.  Macario Golds, [place] [phone]

## 2017-02-12 NOTE — Plan of Care (Signed)
Problem: Activity: Goal: Risk for activity intolerance will decrease Outcome: Progressing Assist / encourage patient to get OOB to chair or ambulation

## 2017-02-12 NOTE — Progress Notes (Signed)
[facility] P[facility]  S: complaining of left side pain, breathing a little short  O:  Vitals:   02/12/17 1000 02/12/17 1100 02/12/17 1115 02/12/17 1139  BP: 92/65 (!) 93/45    Pulse: (!) 55 75    Resp: 15 19    Temp:  98.4 F (36.9 C) 98.3 F (36.8 C) 98.3 F (36.8 C)  TempSrc:  Oral Oral Oral  SpO2: 98%     Weight:      Height:       2LNC  General:  Resting comfortably in bed HENT: NCAT OP clear PULM: few crackels bases, normal effort CV: RRR, no mgr GI: BS+, soft, nontender MSK: normal bulk and tone Neuro: awake, alert, no distress, MAEW  CBC    Component Value Date/Time   WBC 23.9 (H) 02/12/2017 0230   RBC 2.49 (L) 02/12/2017 0230   HGB 7.0 (L) 02/12/2017 0230   HCT 22.9 (L) 02/12/2017 0230   PLT 407 (H) 02/12/2017 0230   MCV 92.0 02/12/2017 0230   MCH 28.1 02/12/2017 0230   MCHC 30.6 02/12/2017 0230   RDW 15.2 02/12/2017 0230   LYMPHSABS 1.7 02/12/2017 0230   MONOABS 1.9 (H) 02/12/2017 0230   EOSABS 0.7 02/12/2017 0230   BASOSABS 0.0 02/12/2017 0230   BMET    Component Value Date/Time   NA 138 02/12/2017 0230   K 4.4 02/12/2017 0230   CL 106 02/12/2017 0230   CO2 24 02/12/2017 0230   GLUCOSE 202 (H) 02/12/2017 0230   BUN 32 (H) 02/12/2017 0230   CREATININE 1.12 (H) 02/12/2017 0230   CREATININE 1.25 (H) 12/17/2012 1014   CALCIUM 7.5 (L) 02/12/2017 0230   GFRNONAA 48 (L) 02/12/2017 0230   GFRAA 55 (L) 02/12/2017 0230    Impression/plan: AKI: improving HCAP: agree with levaquin, if WBC goes up would change back to zosyn, encouraged to mobilize, use incentive spirometry, taught her how to splint today with pillow UTI: levaquin Chest pain: probably needs to be on NSAID when renal function improves  PCCM will be available prn  Heber Glasco, MD [place] [facility] Pager: [phone] Cell: [phone] After 3pm or if no response, call [phone]
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
## 2017-02-13 ENCOUNTER — Inpatient Hospital Stay (HOSPITAL_COMMUNITY): Payer: No Typology Code available for payment source

## 2017-02-13 DIAGNOSIS — N390 Urinary tract infection, site not specified: Secondary | ICD-10-CM | POA: Diagnosis not present

## 2017-02-13 DIAGNOSIS — S36032A Major laceration of spleen, initial encounter: Secondary | ICD-10-CM | POA: Diagnosis not present

## 2017-02-13 DIAGNOSIS — D649 Anemia, unspecified: Secondary | ICD-10-CM | POA: Diagnosis not present

## 2017-02-13 DIAGNOSIS — S329XXA Fracture of unspecified parts of lumbosacral spine and pelvis, initial encounter for closed fracture: Secondary | ICD-10-CM | POA: Diagnosis not present

## 2017-02-13 DIAGNOSIS — R748 Abnormal levels of other serum enzymes: Secondary | ICD-10-CM | POA: Diagnosis not present

## 2017-02-13 DIAGNOSIS — S06899A Other specified intracranial injury with loss of consciousness of unspecified duration, initial encounter: Secondary | ICD-10-CM | POA: Diagnosis not present

## 2017-02-13 DIAGNOSIS — I248 Other forms of acute ischemic heart disease: Secondary | ICD-10-CM | POA: Diagnosis not present

## 2017-02-13 DIAGNOSIS — I5043 Acute on chronic combined systolic (congestive) and diastolic (congestive) heart failure: Secondary | ICD-10-CM | POA: Diagnosis not present

## 2017-02-13 DIAGNOSIS — S2242XA Multiple fractures of ribs, left side, initial encounter for closed fracture: Secondary | ICD-10-CM | POA: Diagnosis not present

## 2017-02-13 LAB — TYPE AND SCREEN
ABO/RH(D): B POS
Antibody Screen: NEGATIVE
Unit division: 0

## 2017-02-13 LAB — POCT I-STAT 3, ART BLOOD GAS (G3+)
ACID-BASE DEFICIT: 2 mmol/L (ref 0.0–2.0)
Bicarbonate: 23.1 mmol/L (ref 20.0–28.0)
O2 SAT: 96 %
PH ART: 7.365 (ref 7.350–7.450)
PO2 ART: 89 mmHg (ref 83.0–108.0)
Patient temperature: 99
TCO2: 24 mmol/L (ref 22–32)
pCO2 arterial: 40.4 mmHg (ref 32.0–48.0)

## 2017-02-13 LAB — GLUCOSE, CAPILLARY
GLUCOSE-CAPILLARY: 103 mg/dL — AB (ref 65–99)
GLUCOSE-CAPILLARY: 108 mg/dL — AB (ref 65–99)
GLUCOSE-CAPILLARY: 122 mg/dL — AB (ref 65–99)
GLUCOSE-CAPILLARY: 155 mg/dL — AB (ref 65–99)
GLUCOSE-CAPILLARY: 93 mg/dL (ref 65–99)
GLUCOSE-CAPILLARY: 97 mg/dL (ref 65–99)
Glucose-Capillary: 143 mg/dL — ABNORMAL HIGH (ref 65–99)

## 2017-02-13 LAB — BASIC METABOLIC PANEL
ANION GAP: 8 (ref 5–15)
BUN: 19 mg/dL (ref 6–20)
CALCIUM: 7.5 mg/dL — AB (ref 8.9–10.3)
CO2: 22 mmol/L (ref 22–32)
CREATININE: 0.96 mg/dL (ref 0.44–1.00)
Chloride: 109 mmol/L (ref 101–111)
GFR calc non Af Amer: 58 mL/min — ABNORMAL LOW (ref >60)
Glucose, Bld: 110 mg/dL — ABNORMAL HIGH (ref 65–99)
Potassium: 4 mmol/L (ref 3.5–5.1)
SODIUM: 139 mmol/L (ref 135–145)

## 2017-02-13 LAB — CULTURE, RESPIRATORY W GRAM STAIN: Culture: NORMAL

## 2017-02-13 LAB — CBC
HEMATOCRIT: 28.2 % — AB (ref 36.0–46.0)
HEMOGLOBIN: 8.5 g/dL — AB (ref 12.0–15.0)
MCH: 27.6 pg (ref 26.0–34.0)
MCHC: 30.1 g/dL (ref 30.0–36.0)
MCV: 91.6 fL (ref 78.0–100.0)
Platelets: 437 10*3/uL — ABNORMAL HIGH (ref 150–400)
RBC: 3.08 MIL/uL — ABNORMAL LOW (ref 3.87–5.11)
RDW: 15.5 % (ref 11.5–15.5)
WBC: 18.9 10*3/uL — AB (ref 4.0–10.5)

## 2017-02-13 LAB — BPAM RBC
BLOOD PRODUCT EXPIRATION DATE: 201810182359
ISSUE DATE / TIME: 201810081047
UNIT TYPE AND RH: 7300

## 2017-02-13 MED ORDER — ENOXAPARIN SODIUM 40 MG/0.4ML ~~LOC~~ SOLN
40.0000 mg | SUBCUTANEOUS | Status: DC
  Administered 2017-02-14 – 2017-02-19 (×6): 40 mg via SUBCUTANEOUS
  Filled 2017-02-13 (×6): qty 0.4

## 2017-02-13 MED ORDER — SODIUM CHLORIDE 0.9% FLUSH
10.0000 mL | INTRAVENOUS | Status: DC | PRN
  Administered 2017-02-18 (×2): 20 mL via INTRAVENOUS
  Filled 2017-02-13 (×2): qty 20

## 2017-02-13 MED ORDER — SODIUM CHLORIDE 0.9% FLUSH
10.0000 mL | Freq: Two times a day (BID) | INTRAVENOUS | Status: DC
  Administered 2017-02-13: 10 mL via INTRAVENOUS
  Administered 2017-02-13: 20 mL via INTRAVENOUS
  Administered 2017-02-14 – 2017-02-16 (×5): 10 mL via INTRAVENOUS
  Administered 2017-02-16: 20 mL via INTRAVENOUS
  Administered 2017-02-17: 10 mL via INTRAVENOUS
  Administered 2017-02-17: 20 mL via INTRAVENOUS
  Administered 2017-02-18 – 2017-02-19 (×3): 10 mL via INTRAVENOUS

## 2017-02-13 MED ORDER — LOSARTAN POTASSIUM 50 MG PO TABS
25.0000 mg | ORAL_TABLET | Freq: Every day | ORAL | Status: DC
  Administered 2017-02-13 – 2017-02-19 (×6): 25 mg via ORAL
  Filled 2017-02-13 (×8): qty 1

## 2017-02-13 MED ORDER — ENSURE ENLIVE PO LIQD
237.0000 mL | Freq: Three times a day (TID) | ORAL | Status: DC
  Administered 2017-02-13 – 2017-02-19 (×16): 237 mL via ORAL
  Filled 2017-02-13 (×3): qty 237
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
## 2017-02-13 NOTE — Progress Notes (Addendum)
Pt c/o of feeling hot, light turned off and temp of room turned down to the lowest. Pt still c/o feeling hot,  Ice pack offered and as RN at pt bedside pt c/o dyspnea . Encouraged  to take couple of deep breath  Pt saturation dropped  to 86 with 2l via N/c. Oxygen increased to 3-4 liters but O2sat, remained at. 90 . Charge nurse notified and  this  RN also paged rapid response nurse as pt continue  c/o CP & dyspnea.
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
## 2017-02-13 NOTE — Progress Notes (Signed)
Patient transferred from unit 38M to room 4NP04 at this time. Alert and in stable condition. Call light within reach and bed in lowest position. Family at bedside.
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
## 2017-02-14 ENCOUNTER — Inpatient Hospital Stay (HOSPITAL_COMMUNITY): Payer: No Typology Code available for payment source

## 2017-02-14 DIAGNOSIS — R079 Chest pain, unspecified: Secondary | ICD-10-CM | POA: Diagnosis not present

## 2017-02-14 DIAGNOSIS — D649 Anemia, unspecified: Secondary | ICD-10-CM | POA: Diagnosis not present

## 2017-02-14 DIAGNOSIS — S36032A Major laceration of spleen, initial encounter: Secondary | ICD-10-CM | POA: Diagnosis not present

## 2017-02-14 DIAGNOSIS — S329XXA Fracture of unspecified parts of lumbosacral spine and pelvis, initial encounter for closed fracture: Secondary | ICD-10-CM | POA: Diagnosis not present

## 2017-02-14 DIAGNOSIS — J150 Pneumonia due to Klebsiella pneumoniae: Secondary | ICD-10-CM | POA: Diagnosis not present

## 2017-02-14 DIAGNOSIS — S06899A Other specified intracranial injury with loss of consciousness of unspecified duration, initial encounter: Secondary | ICD-10-CM | POA: Diagnosis not present

## 2017-02-14 DIAGNOSIS — S2242XA Multiple fractures of ribs, left side, initial encounter for closed fracture: Secondary | ICD-10-CM | POA: Diagnosis not present

## 2017-02-14 DIAGNOSIS — N390 Urinary tract infection, site not specified: Secondary | ICD-10-CM | POA: Diagnosis not present

## 2017-02-14 DIAGNOSIS — E875 Hyperkalemia: Secondary | ICD-10-CM | POA: Diagnosis not present

## 2017-02-14 LAB — GLUCOSE, CAPILLARY
GLUCOSE-CAPILLARY: 151 mg/dL — AB (ref 65–99)
GLUCOSE-CAPILLARY: 119 mg/dL — AB (ref 65–99)
Glucose-Capillary: 175 mg/dL — ABNORMAL HIGH (ref 65–99)
Glucose-Capillary: 106 mg/dL — ABNORMAL HIGH (ref 65–99)

## 2017-02-14 MED ORDER — ALPRAZOLAM 0.25 MG PO TABS
0.2500 mg | ORAL_TABLET | Freq: Two times a day (BID) | ORAL | Status: DC | PRN
  Administered 2017-02-15 – 2017-02-18 (×5): 0.25 mg via ORAL
  Filled 2017-02-14 (×5): qty 1

## 2017-02-14 MED ORDER — FUROSEMIDE 10 MG/ML IJ SOLN
20.0000 mg | Freq: Once | INTRAMUSCULAR | Status: AC
  Administered 2017-02-14: 20 mg via INTRAVENOUS
  Filled 2017-02-14: qty 2

## 2017-02-14 NOTE — Progress Notes (Signed)
Trauma Service Note  Subjective: Patient stating that over the last 24 hours that she has had some difficulty breathing ans some shortness of breath.  Objective: Vital signs in last 24 hours: Temp:  [98 F (36.7 C)-99 F (37.2 C)] 98.2 F (36.8 C) (10/10 0619) Pulse Rate:  [46-107] 81 (10/10 0800) Resp:  [11-31] 11 (10/10 0800) BP: (131-160)/(63-82) 139/71 (10/10 0800) SpO2:  [90 %-98 %] 95 % (10/10 0800) Last BM Date: 02/14/17  Intake/Output from previous day: 10/09 0701 - 10/10 0700 In: 1225 [P.O.:650; I.V.:575] Out: 1250 [Urine:1250] Intake/Output this shift: No intake/output data recorded.  General: Seems comfortable, but stating that she is SOB  Lungs: Clear to auscultation.  No rales or rhonchi.  No wheezes.  CXR shows mayb a slight increase in pulmonary interstitial fluid on the right.  Abd: Soft, benign  Extremities: Intact.  No clinical signs or symptom of DVT  Neuro: Intact  Lab Results: CBC   Recent Labs  02/12/17 1530 02/13/17 0800  WBC 22.0* 18.9*  HGB 8.6* 8.5*  HCT 28.6* 28.2*  PLT 434* 437*   BMET  Recent Labs  02/12/17 1931 02/13/17 0800  NA 139 139  K 4.0 4.0  CL 109 109  CO2 25 22  GLUCOSE 108* 110*  BUN 25* 19  CREATININE 0.99 0.96  CALCIUM 7.7* 7.5*   PT/INR No results for input(s): LABPROT, INR in the last 72 hours. ABG  Recent Labs  02/13/17 2359  PHART 7.365  HCO3 23.1    Studies/Results: Dg Chest Port 1 View  Result Date: 02/14/2017 CLINICAL DATA:  Initial evaluation for acute chest pain. EXAM: PORTABLE CHEST 1 VIEW COMPARISON:  Prior radiograph from 02/11/2017. FINDINGS: Median sternotomy wires underlying CABG markers and surgical clips noted. Advanced cardiomegaly, stable from previous. Mediastinal silhouette normal. Right-sided PICC catheter in place with tip overlying the cavoatrial junction. Lungs hypoinflated. Veiling opacity overlying the bilateral hemidiaphragms consistent with pleural effusions. Diffuse  vascular congestion with interstitial prominence compatible with pulmonary edema. Superimposed bibasilar opacities favored to reflect atelectasis and/ or edema, although infiltrates would be difficult to exclude. No pneumothorax. Left-sided rib fractures again noted. No new acute osseous abnormality. IMPRESSION: 1. Cardiomegaly with diffuse pulmonary edema and layering bilateral pleural effusions, suggesting acute CHF exacerbation. 2. Superimposed more confluent bibasilar opacities, favored to reflect atelectasis and/or edema, although infiltrates could be considered in the correct clinical setting. 3. Left-sided rib fractures, grossly stable relative to recent examination. Electronically Signed   By: Rise Mu M.D.   On: 02/14/2017 00:28    Anti-infectives: Anti-infectives    Start     Dose/Rate Route Frequency Ordered Stop   02/12/17 0000  vancomycin (VANCOCIN) IVPB 1000 mg/200 mL premix  Status:  Discontinued     1,000 mg 200 mL/hr over 60 Minutes Intravenous Every 24 hours 02/10/17 2317 02/12/17 0747   02/11/17 0600  piperacillin-tazobactam (ZOSYN) IVPB 3.375 g  Status:  Discontinued     3.375 g 12.5 mL/hr over 240 Minutes Intravenous Every 8 hours 02/10/17 2317 02/12/17 0747   02/10/17 2330  vancomycin (VANCOCIN) 1,750 mg in sodium chloride 0.9 % 500 mL IVPB     1,750 mg 250 mL/hr over 120 Minutes Intravenous  Once 02/10/17 2317 02/11/17 0213   02/10/17 2330  piperacillin-tazobactam (ZOSYN) IVPB 3.375 g     3.375 g 100 mL/hr over 30 Minutes Intravenous  Once 02/10/17 2317 02/11/17 0010   02/09/17 2200  levofloxacin (LEVAQUIN) tablet 500 mg     500 mg Oral  Every 24 hours 02/09/17 1015     02/05/17 2100  levofloxacin (LEVAQUIN) IVPB 500 mg  Status:  Discontinued    Comments:  Pharmacy to adjust dosing if needed   500 mg 100 mL/hr over 60 Minutes Intravenous Every 24 hours 02/05/17 2038 02/09/17 1015   02/02/17 1600  sulfamethoxazole-trimethoprim (BACTRIM,SEPTRA) 200-40 MG/5ML  suspension 20 mL  Status:  Discontinued     20 mL Oral Every 12 hours 02/02/17 1450 02/05/17 2144   02/02/17 1315  sulfamethoxazole-trimethoprim (BACTRIM) 160 mg in dextrose 5 % 250 mL IVPB  Status:  Discontinued     160 mg 260 mL/hr over 60 Minutes Intravenous Every 12 hours 02/02/17 1215 02/02/17 1450   02/02/17 1030  sulfamethoxazole-trimethoprim (BACTRIM DS,SEPTRA DS) 800-160 MG per tablet 1 tablet  Status:  Discontinued     1 tablet Oral Every 12 hours 02/02/17 1005 02/02/17 1215      Assessment/Plan: s/p  One dose of Lasix.  Placement.  LOS: 15 days   Marta Lamas. Gae Bon, MD, FACS [phone] Trauma Surgeon 02/14/2017

## 2017-02-14 NOTE — Progress Notes (Signed)
Pt feels much better this am oxygen  saturates 95-98 %.  Medication with morphine and pt resting quietly with. no acute distress.
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
## 2017-02-14 NOTE — Progress Notes (Signed)
SLP Cancellation Note  Patient Details Name: [name] MRN: [id number] DOB: [birth year]   Cancelled treatment:       Reason Eval/Treat Not Completed: Patient unavailable - with Physical Therapy at this time. ST will continue efforts.  Siah Steely B. Murvin Natal [facility], CCC-SLP Speech Language Pathologist [phone]  Leigh Aurora 02/14/2017, 3:04 PM
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
## 2017-02-14 NOTE — Significant Event (Addendum)
Rapid Response Event Note  Overview: Time Called: 2330 Arrival Time: 2335 Event Type: Respiratory  Initial Focused Assessment: Called to RRT page. Upon arrival, per RN, patient was complaining of shortness of breath after being turned in the bed, appeared slightly anxious, patient felt hot.  Per RN oxygen saturations were in the upper 80s, patient was on 2L nasal cannula.  Patient was alert, did follow commands, lung sounds were diminished overall but good air movement.  Patient was not in acute respiratory distress, skin warm and dry, + 1 trace edema in extremities. Patient was transitioned to [facility] 12L and then gradually weaned down to 3L [place].  + Foley.   Interventions: -- Trauma MD ordered ABG and CXR,  ABG normal, CXR - mild pulmonary edema -- Symptoms improved quickly -- Cold packs and towels applied for comfort.   Plan of Care (if not transferred): -- Called at 0055 for follow up, patient was resting comfortably  Event Summary: Name of Physician Notified: Trauma MD by Bedside RN   Outcome: Stayed in room and stabalized  Event End Time: 0015  Henley Boettner R

## 2017-02-15 DIAGNOSIS — S329XXA Fracture of unspecified parts of lumbosacral spine and pelvis, initial encounter for closed fracture: Secondary | ICD-10-CM | POA: Diagnosis not present

## 2017-02-15 DIAGNOSIS — S36032A Major laceration of spleen, initial encounter: Secondary | ICD-10-CM | POA: Diagnosis not present

## 2017-02-15 DIAGNOSIS — S06899A Other specified intracranial injury with loss of consciousness of unspecified duration, initial encounter: Secondary | ICD-10-CM | POA: Diagnosis not present

## 2017-02-15 DIAGNOSIS — S2242XA Multiple fractures of ribs, left side, initial encounter for closed fracture: Secondary | ICD-10-CM | POA: Diagnosis not present

## 2017-02-15 DIAGNOSIS — D649 Anemia, unspecified: Secondary | ICD-10-CM | POA: Diagnosis not present

## 2017-02-15 DIAGNOSIS — N390 Urinary tract infection, site not specified: Secondary | ICD-10-CM | POA: Diagnosis not present

## 2017-02-15 LAB — BASIC METABOLIC PANEL
Anion gap: 5 (ref 5–15)
BUN: 12 mg/dL (ref 6–20)
CO2: 28 mmol/L (ref 22–32)
Calcium: 8 mg/dL — ABNORMAL LOW (ref 8.9–10.3)
Chloride: 105 mmol/L (ref 101–111)
Creatinine, Ser: 0.71 mg/dL (ref 0.44–1.00)
GFR calc Af Amer: >60 mL/min (ref >60)
GLUCOSE: 163 mg/dL — AB (ref 65–99)
POTASSIUM: 3.7 mmol/L (ref 3.5–5.1)
Sodium: 138 mmol/L (ref 135–145)

## 2017-02-15 LAB — CBC WITH DIFFERENTIAL/PLATELET
BASOS ABS: 0 10*3/uL (ref 0.0–0.1)
Basophils Relative: 0 %
EOS PCT: 2 %
Eosinophils Absolute: 0.4 10*3/uL (ref 0.0–0.7)
HEMATOCRIT: 32.4 % — AB (ref 36.0–46.0)
Hemoglobin: 9.8 g/dL — ABNORMAL LOW (ref 12.0–15.0)
LYMPHS PCT: 5 %
Lymphs Abs: 1 10*3/uL (ref 0.7–4.0)
MCH: 27.8 pg (ref 26.0–34.0)
MCHC: 30.2 g/dL (ref 30.0–36.0)
MCV: 91.8 fL (ref 78.0–100.0)
MONO ABS: 2 10*3/uL — AB (ref 0.1–1.0)
MONOS PCT: 10 %
NEUTROS ABS: 15.9 10*3/uL — AB (ref 1.7–7.7)
Neutrophils Relative %: 83 %
PLATELETS: 544 10*3/uL — AB (ref 150–400)
RBC: 3.53 MIL/uL — ABNORMAL LOW (ref 3.87–5.11)
RDW: 15.7 % — AB (ref 11.5–15.5)
WBC: 19.3 10*3/uL — ABNORMAL HIGH (ref 4.0–10.5)

## 2017-02-15 LAB — GLUCOSE, CAPILLARY
GLUCOSE-CAPILLARY: 169 mg/dL — AB (ref 65–99)
GLUCOSE-CAPILLARY: 160 mg/dL — AB (ref 65–99)
GLUCOSE-CAPILLARY: 136 mg/dL — AB (ref 65–99)
GLUCOSE-CAPILLARY: 213 mg/dL — AB (ref 65–99)
GLUCOSE-CAPILLARY: 129 mg/dL — AB (ref 65–99)
Glucose-Capillary: 198 mg/dL — ABNORMAL HIGH (ref 65–99)
Glucose-Capillary: 127 mg/dL — ABNORMAL HIGH (ref 65–99)
Glucose-Capillary: 118 mg/dL — ABNORMAL HIGH (ref 65–99)
Glucose-Capillary: 134 mg/dL — ABNORMAL HIGH (ref 65–99)

## 2017-02-15 MED ORDER — HAEMOPHILUS B POLYSAC CONJ VAC IM SOLR
0.5000 mL | Freq: Once | INTRAMUSCULAR | Status: AC
  Administered 2017-02-15: 0.5 mL via INTRAMUSCULAR
  Filled 2017-02-15: qty 0.5

## 2017-02-15 MED ORDER — TAMSULOSIN HCL 0.4 MG PO CAPS
0.4000 mg | ORAL_CAPSULE | Freq: Every day | ORAL | Status: DC
  Administered 2017-02-15 – 2017-02-19 (×5): 0.4 mg via ORAL
  Filled 2017-02-15 (×5): qty 1

## 2017-02-15 MED ORDER — SODIUM CHLORIDE 0.9% FLUSH
3.0000 mL | Freq: Two times a day (BID) | INTRAVENOUS | Status: DC
  Administered 2017-02-15 – 2017-02-18 (×5): 3 mL via INTRAVENOUS

## 2017-02-15 MED ORDER — POTASSIUM CHLORIDE CRYS ER 20 MEQ PO TBCR
20.0000 meq | EXTENDED_RELEASE_TABLET | Freq: Two times a day (BID) | ORAL | Status: AC
  Administered 2017-02-15 (×2): 20 meq via ORAL
  Filled 2017-02-15 (×2): qty 1

## 2017-02-15 MED ORDER — SODIUM CHLORIDE 0.9% FLUSH: 3.0000 mL | INTRAVENOUS | Status: DC | PRN

## 2017-02-15 MED ORDER — MENINGOCOCCAL A C Y&W-135 OLIG IM SOLR
0.5000 mL | Freq: Once | INTRAMUSCULAR | Status: AC
  Administered 2017-02-15: 0.5 mL via INTRAMUSCULAR
  Filled 2017-02-15: qty 0.5

## 2017-02-15 MED ORDER — FUROSEMIDE 10 MG/ML IJ SOLN
20.0000 mg | Freq: Once | INTRAMUSCULAR | Status: AC
Start: 2017-02-15 — End: 2017-02-15
  Administered 2017-02-15: 20 mg via INTRAVENOUS
  Filled 2017-02-15: qty 2

## 2017-02-15 MED ORDER — PNEUMOCOCCAL VAC POLYVALENT 25 MCG/0.5ML IJ INJ: 0.5000 mL | INJECTION | INTRAMUSCULAR | Status: DC

## 2017-02-15 MED ORDER — PNEUMOCOCCAL VAC POLYVALENT 25 MCG/0.5ML IJ INJ: 0.5000 mL | INJECTION | INTRAMUSCULAR | Status: DC | PRN

## 2017-02-15 MED ORDER — SODIUM CHLORIDE 0.9 % IV SOLN: 250.0000 mL | INTRAVENOUS | Status: DC | PRN

## 2017-02-15 MED ORDER — BETHANECHOL CHLORIDE 25 MG PO TABS
25.0000 mg | ORAL_TABLET | Freq: Three times a day (TID) | ORAL | Status: DC
  Administered 2017-02-15 – 2017-02-19 (×13): 25 mg via ORAL
  Filled 2017-02-15 (×13): qty 1
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
## 2017-02-15 NOTE — Clinical Social Work Note (Signed)
Clinical Social Worker continuing to follow patient and family for support and discharge planning needs.  Patient is making improvements and per MD note, possible discharge tomorrow.  CSW spoke with facility who states that insurance authorization has been received and patient can admit Friday/Saturday when medically stable.  CSW remains available for support and to facilitate patient discharge needs once medically ready.  Macario Golds, [place] [phone]
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
## 2017-02-16 DIAGNOSIS — S2242XA Multiple fractures of ribs, left side, initial encounter for closed fracture: Secondary | ICD-10-CM | POA: Diagnosis not present

## 2017-02-16 DIAGNOSIS — S36032A Major laceration of spleen, initial encounter: Secondary | ICD-10-CM | POA: Diagnosis not present

## 2017-02-16 DIAGNOSIS — S329XXA Fracture of unspecified parts of lumbosacral spine and pelvis, initial encounter for closed fracture: Secondary | ICD-10-CM | POA: Diagnosis not present

## 2017-02-16 DIAGNOSIS — N390 Urinary tract infection, site not specified: Secondary | ICD-10-CM | POA: Diagnosis not present

## 2017-02-16 DIAGNOSIS — S06899A Other specified intracranial injury with loss of consciousness of unspecified duration, initial encounter: Secondary | ICD-10-CM | POA: Diagnosis not present

## 2017-02-16 DIAGNOSIS — E875 Hyperkalemia: Secondary | ICD-10-CM | POA: Diagnosis not present

## 2017-02-16 DIAGNOSIS — D649 Anemia, unspecified: Secondary | ICD-10-CM | POA: Diagnosis not present

## 2017-02-16 DIAGNOSIS — J150 Pneumonia due to Klebsiella pneumoniae: Secondary | ICD-10-CM | POA: Diagnosis not present

## 2017-02-16 LAB — CBC
HCT: 28.6 % — ABNORMAL LOW (ref 36.0–46.0)
Hemoglobin: 8.6 g/dL — ABNORMAL LOW (ref 12.0–15.0)
MCH: 27.7 pg (ref 26.0–34.0)
MCHC: 30.1 g/dL (ref 30.0–36.0)
MCV: 92.3 fL (ref 78.0–100.0)
PLATELETS: 552 10*3/uL — AB (ref 150–400)
RBC: 3.1 MIL/uL — ABNORMAL LOW (ref 3.87–5.11)
RDW: 15.8 % — AB (ref 11.5–15.5)
WBC: 14 10*3/uL — AB (ref 4.0–10.5)

## 2017-02-16 LAB — BASIC METABOLIC PANEL
ANION GAP: 4 — AB (ref 5–15)
BUN: 16 mg/dL (ref 6–20)
CALCIUM: 8.1 mg/dL — AB (ref 8.9–10.3)
CO2: 29 mmol/L (ref 22–32)
Chloride: 107 mmol/L (ref 101–111)
Creatinine, Ser: 0.72 mg/dL (ref 0.44–1.00)
GFR calc Af Amer: >60 mL/min (ref >60)
GLUCOSE: 86 mg/dL (ref 65–99)
Potassium: 4.4 mmol/L (ref 3.5–5.1)
Sodium: 140 mmol/L (ref 135–145)

## 2017-02-16 LAB — CULTURE, BLOOD (ROUTINE X 2)
CULTURE: NO GROWTH
Culture: NO GROWTH
SPECIAL REQUESTS: ADEQUATE
Special Requests: ADEQUATE

## 2017-02-16 LAB — GLUCOSE, CAPILLARY
Glucose-Capillary: 73 mg/dL (ref 65–99)
Glucose-Capillary: 104 mg/dL — ABNORMAL HIGH (ref 65–99)
Glucose-Capillary: 193 mg/dL — ABNORMAL HIGH (ref 65–99)
Glucose-Capillary: 187 mg/dL — ABNORMAL HIGH (ref 65–99)

## 2017-02-16 MED ORDER — FUROSEMIDE 40 MG PO TABS
40.0000 mg | ORAL_TABLET | Freq: Every day | ORAL | Status: DC
  Administered 2017-02-17 – 2017-02-19 (×3): 40 mg via ORAL
  Filled 2017-02-16 (×4): qty 1
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
## 2017-02-16 NOTE — Plan of Care (Signed)
Problem: Physical Regulation: Goal: Ability to maintain clinical measurements within normal limits will improve Outcome: Progressing Pt said the more she uses her incentive spirometer, the easier it gets.
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
## 2017-02-16 NOTE — Clinical Social Work Note (Signed)
Clinical Social Worker continuing to follow patient and family for support and discharge planning needs.  Patient not quite medically stable for discharge to [facility], however possible over the weekend.  Facility plans to contact patient insurance to request authorization over the week.  Pending authorization, patient may discharge over the weekend to [facility].  CSW remains available for support and to facilitate patient discharge needs once medically stable.  Macario Golds, [place] [phone]

## 2017-02-17 DIAGNOSIS — S36032A Major laceration of spleen, initial encounter: Secondary | ICD-10-CM | POA: Diagnosis not present

## 2017-02-17 DIAGNOSIS — S2242XA Multiple fractures of ribs, left side, initial encounter for closed fracture: Secondary | ICD-10-CM | POA: Diagnosis not present

## 2017-02-17 DIAGNOSIS — S06899A Other specified intracranial injury with loss of consciousness of unspecified duration, initial encounter: Secondary | ICD-10-CM | POA: Diagnosis not present

## 2017-02-17 DIAGNOSIS — S329XXA Fracture of unspecified parts of lumbosacral spine and pelvis, initial encounter for closed fracture: Secondary | ICD-10-CM | POA: Diagnosis not present

## 2017-02-17 DIAGNOSIS — N390 Urinary tract infection, site not specified: Secondary | ICD-10-CM | POA: Diagnosis not present

## 2017-02-17 DIAGNOSIS — D649 Anemia, unspecified: Secondary | ICD-10-CM | POA: Diagnosis not present

## 2017-02-17 LAB — GLUCOSE, CAPILLARY
GLUCOSE-CAPILLARY: 188 mg/dL — AB (ref 65–99)
Glucose-Capillary: 109 mg/dL — ABNORMAL HIGH (ref 65–99)
Glucose-Capillary: 180 mg/dL — ABNORMAL HIGH (ref 65–99)
Glucose-Capillary: 184 mg/dL — ABNORMAL HIGH (ref 65–99)
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
## 2017-02-18 DIAGNOSIS — S329XXA Fracture of unspecified parts of lumbosacral spine and pelvis, initial encounter for closed fracture: Secondary | ICD-10-CM | POA: Diagnosis not present

## 2017-02-18 DIAGNOSIS — D649 Anemia, unspecified: Secondary | ICD-10-CM | POA: Diagnosis not present

## 2017-02-18 DIAGNOSIS — S06899A Other specified intracranial injury with loss of consciousness of unspecified duration, initial encounter: Secondary | ICD-10-CM | POA: Diagnosis not present

## 2017-02-18 DIAGNOSIS — S2242XA Multiple fractures of ribs, left side, initial encounter for closed fracture: Secondary | ICD-10-CM | POA: Diagnosis not present

## 2017-02-18 DIAGNOSIS — N390 Urinary tract infection, site not specified: Secondary | ICD-10-CM | POA: Diagnosis not present

## 2017-02-18 DIAGNOSIS — S36032A Major laceration of spleen, initial encounter: Secondary | ICD-10-CM | POA: Diagnosis not present

## 2017-02-18 LAB — CBC
HCT: 28.2 % — ABNORMAL LOW (ref 36.0–46.0)
Hemoglobin: 8.4 g/dL — ABNORMAL LOW (ref 12.0–15.0)
MCH: 27.4 pg (ref 26.0–34.0)
MCHC: 29.8 g/dL — ABNORMAL LOW (ref 30.0–36.0)
MCV: 91.9 fL (ref 78.0–100.0)
PLATELETS: 570 10*3/uL — AB (ref 150–400)
RBC: 3.07 MIL/uL — ABNORMAL LOW (ref 3.87–5.11)
RDW: 15.2 % (ref 11.5–15.5)
WBC: 11.6 10*3/uL — AB (ref 4.0–10.5)

## 2017-02-18 LAB — GLUCOSE, CAPILLARY
GLUCOSE-CAPILLARY: 149 mg/dL — AB (ref 65–99)
GLUCOSE-CAPILLARY: 143 mg/dL — AB (ref 65–99)
GLUCOSE-CAPILLARY: 210 mg/dL — AB (ref 65–99)
Glucose-Capillary: 161 mg/dL — ABNORMAL HIGH (ref 65–99)

## 2017-02-18 LAB — BASIC METABOLIC PANEL
ANION GAP: 4 — AB (ref 5–15)
BUN: 13 mg/dL (ref 6–20)
CALCIUM: 8.1 mg/dL — AB (ref 8.9–10.3)
CO2: 31 mmol/L (ref 22–32)
CREATININE: 0.63 mg/dL (ref 0.44–1.00)
Chloride: 102 mmol/L (ref 101–111)
GLUCOSE: 147 mg/dL — AB (ref 65–99)
Potassium: 4 mmol/L (ref 3.5–5.1)
Sodium: 137 mmol/L (ref 135–145)
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
## 2017-02-18 NOTE — Progress Notes (Signed)
Patient maintained O2 sat above 92 throughout day without oxygen.  Patient placed back on 1L at dinner as level reading 88%. No complaints at this time or during day. Will work to wean patient back off O2.
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
## 2017-02-19 DIAGNOSIS — S060X9A Concussion with loss of consciousness of unspecified duration, initial encounter: Secondary | ICD-10-CM | POA: Diagnosis not present

## 2017-02-19 DIAGNOSIS — Z5189 Encounter for other specified aftercare: Secondary | ICD-10-CM | POA: Diagnosis not present

## 2017-02-19 DIAGNOSIS — R2689 Other abnormalities of gait and mobility: Secondary | ICD-10-CM | POA: Diagnosis not present

## 2017-02-19 DIAGNOSIS — J189 Pneumonia, unspecified organism: Secondary | ICD-10-CM | POA: Diagnosis not present

## 2017-02-19 DIAGNOSIS — R41841 Cognitive communication deficit: Secondary | ICD-10-CM | POA: Diagnosis not present

## 2017-02-19 DIAGNOSIS — J918 Pleural effusion in other conditions classified elsewhere: Secondary | ICD-10-CM | POA: Diagnosis not present

## 2017-02-19 DIAGNOSIS — M6281 Muscle weakness (generalized): Secondary | ICD-10-CM | POA: Diagnosis not present

## 2017-02-19 DIAGNOSIS — I998 Other disorder of circulatory system: Secondary | ICD-10-CM | POA: Diagnosis not present

## 2017-02-19 DIAGNOSIS — S2249XA Multiple fractures of ribs, unspecified side, initial encounter for closed fracture: Secondary | ICD-10-CM | POA: Diagnosis not present

## 2017-02-19 DIAGNOSIS — R1013 Epigastric pain: Secondary | ICD-10-CM | POA: Diagnosis not present

## 2017-02-19 DIAGNOSIS — R0602 Shortness of breath: Secondary | ICD-10-CM | POA: Diagnosis not present

## 2017-02-19 DIAGNOSIS — R278 Other lack of coordination: Secondary | ICD-10-CM | POA: Diagnosis not present

## 2017-02-19 DIAGNOSIS — R1312 Dysphagia, oropharyngeal phase: Secondary | ICD-10-CM | POA: Diagnosis not present

## 2017-02-19 DIAGNOSIS — K219 Gastro-esophageal reflux disease without esophagitis: Secondary | ICD-10-CM | POA: Diagnosis not present

## 2017-02-19 DIAGNOSIS — R600 Localized edema: Secondary | ICD-10-CM | POA: Diagnosis not present

## 2017-02-19 DIAGNOSIS — S36039A Unspecified laceration of spleen, initial encounter: Secondary | ICD-10-CM | POA: Diagnosis not present

## 2017-02-19 DIAGNOSIS — S32810D Multiple fractures of pelvis with stable disruption of pelvic ring, subsequent encounter for fracture with routine healing: Secondary | ICD-10-CM | POA: Diagnosis not present

## 2017-02-19 DIAGNOSIS — E119 Type 2 diabetes mellitus without complications: Secondary | ICD-10-CM | POA: Diagnosis not present

## 2017-02-19 LAB — GLUCOSE, CAPILLARY
GLUCOSE-CAPILLARY: 189 mg/dL — AB (ref 65–99)
Glucose-Capillary: 150 mg/dL — ABNORMAL HIGH (ref 65–99)

## 2017-02-19 MED ORDER — ENSURE ENLIVE PO LIQD: 237.0000 mL | Freq: Three times a day (TID) | ORAL | 12 refills | Status: DC

## 2017-02-19 MED ORDER — PNEUMOCOCCAL VAC POLYVALENT 25 MCG/0.5ML IJ INJ
0.5000 mL | INJECTION | Freq: Once | INTRAMUSCULAR | Status: DC
  Filled 2017-02-19: qty 0.5

## 2017-02-19 MED ORDER — OXYCODONE HCL 5 MG PO TABS: 5.0000 mg | ORAL_TABLET | Freq: Three times a day (TID) | ORAL | 0 refills | Status: DC | PRN

## 2017-02-19 MED ORDER — DOCUSATE SODIUM 100 MG PO CAPS: 100.0000 mg | ORAL_CAPSULE | Freq: Two times a day (BID) | ORAL | 0 refills | Status: DC

## 2017-02-19 MED ORDER — ACETAMINOPHEN 325 MG PO TABS: 650.0000 mg | ORAL_TABLET | Freq: Three times a day (TID) | ORAL | Status: DC

## 2017-02-19 MED ORDER — ALPRAZOLAM 0.25 MG PO TABS: 0.2500 mg | ORAL_TABLET | Freq: Two times a day (BID) | ORAL | 0 refills | Status: DC | PRN

## 2017-02-19 MED ORDER — CARVEDILOL 3.125 MG PO TABS: 3.1250 mg | ORAL_TABLET | Freq: Two times a day (BID) | ORAL | Status: DC

## 2017-02-19 MED ORDER — CHLORHEXIDINE GLUCONATE 0.12 % MT SOLN: 15.0000 mL | Freq: Two times a day (BID) | OROMUCOSAL | 0 refills | Status: DC

## 2017-02-19 MED ORDER — INSULIN GLARGINE 100 UNIT/ML ~~LOC~~ SOLN: 14.0000 [IU] | Freq: Every day | SUBCUTANEOUS | 11 refills | Status: DC

## 2017-02-19 MED ORDER — IPRATROPIUM-ALBUTEROL 0.5-2.5 (3) MG/3ML IN SOLN: 3.0000 mL | RESPIRATORY_TRACT | Status: DC | PRN

## 2017-02-19 MED ORDER — TAMSULOSIN HCL 0.4 MG PO CAPS: 0.4000 mg | ORAL_CAPSULE | Freq: Every day | ORAL | Status: DC

## 2017-02-19 MED ORDER — LOSARTAN POTASSIUM 25 MG PO TABS: 25.0000 mg | ORAL_TABLET | Freq: Every day | ORAL | Status: DC

## 2017-02-19 MED ORDER — BETHANECHOL CHLORIDE 25 MG PO TABS
25.0000 mg | ORAL_TABLET | Freq: Three times a day (TID) | ORAL | Status: DC
Start: 2017-02-19 — End: 2017-06-11

## 2017-02-19 MED ORDER — ORAL CARE MOUTH RINSE: 15.0000 mL | Freq: Two times a day (BID) | OROMUCOSAL | 0 refills | Status: DC

## 2017-02-19 MED ORDER — INSULIN ASPART 100 UNIT/ML ~~LOC~~ SOLN: 0.0000 [IU] | Freq: Three times a day (TID) | SUBCUTANEOUS | 11 refills | Status: DC

## 2017-02-19 NOTE — Plan of Care (Signed)
Problem: Physical Regulation: Goal: Ability to maintain clinical measurements within normal limits will improve Outcome: Progressing Pt was weaned off oxygen via nasal cannula on day shift yesterday while up in chair, sats remained normal. However, sats dropped into upper 80s while falling asleep. Pt was placed on [place] again , sats are back in the 90s. Continuing to monitor.
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
## 2017-02-19 NOTE — Progress Notes (Signed)
Patient and family member instructed to leave pressure dressing at PICC removal site in place for 24-28 hours, do not get wet and return to ER if bleeding occurs that cannot be controlled with pressure applied to site.  Verbalize understanding.  Gasper Lloyd, RN VAST

## 2017-02-19 NOTE — Progress Notes (Signed)
Report given to Shanda Bumps, Charity fundraiser at [facility].
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
## 2017-02-19 NOTE — Clinical Social Work Placement (Signed)
   CLINICAL SOCIAL WORK PLACEMENT  NOTE  Date:  02/19/2017  Patient Details  Name: [name] MRN: [id number] Date of Birth: 1945/06/10  Clinical Social Work is seeking post-discharge placement for this patient at the Skilled  Nursing Facility level of care (*CSW will initial, date and re-position this form in  chart as items are completed):  Yes   Patient/family provided with [place] Clinical Social Work Department's list of facilities offering this level of care within the geographic area requested by the patient (or if unable, by the patient's family).  Yes   Patient/family informed of their freedom to choose among providers that offer the needed level of care, that participate in Medicare, Medicaid or managed care program needed by the patient, have an available bed and are willing to accept the patient.  Yes   Patient/family informed of [facility]'s ownership interest in [facility] and [facility], as well as of the fact that they are under no obligation to receive care at these facilities.  PASRR submitted to EDS on 02/13/17     PASRR number received on 02/13/17     Existing PASRR number confirmed on       FL2 transmitted to all facilities in geographic area requested by pt/family on 02/13/17     FL2 transmitted to all facilities within larger geographic area on       Patient informed that his/her managed care company has contracts with or will negotiate with certain facilities, including the following:        Yes   Patient/family informed of bed offers received.  Patient chooses bed at [facility]     Physician recommends and patient chooses bed at [facility]    Patient to be transferred to [facility] on 02/19/17.  Patient to be transferred to facility by Ambulance     Patient family notified on 02/19/17 of transfer.  Name of family member notified:  Patient daughter at bedside     PHYSICIAN Please prepare priority discharge summary,  including medications     Additional Comment:   Macario Golds, LCSW [phone]

## 2017-02-19 NOTE — Progress Notes (Signed)
[facility] arrived to transport patient to [facility]. 
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
## 2017-02-19 NOTE — Clinical Social Work Note (Signed)
Clinical Social Worker facilitated patient discharge including contacting patient family and facility to confirm patient discharge plans.  Clinical information faxed to facility and family agreeable with plan.  CSW arranged ambulance transport via [facility] to [facility].  RN to call report prior to discharge.  Clinical Social Worker will sign off for now as social work intervention is no longer needed. Please consult us again if new need arises.  Jesse Petrina Melby, LCSW [phone] 

## 2017-02-28 DIAGNOSIS — S060X9A Concussion with loss of consciousness of unspecified duration, initial encounter: Secondary | ICD-10-CM | POA: Diagnosis not present

## 2017-02-28 DIAGNOSIS — S2249XA Multiple fractures of ribs, unspecified side, initial encounter for closed fracture: Secondary | ICD-10-CM | POA: Diagnosis not present

## 2017-02-28 DIAGNOSIS — S36039A Unspecified laceration of spleen, initial encounter: Secondary | ICD-10-CM | POA: Diagnosis not present

## 2017-03-05 DIAGNOSIS — S2249XA Multiple fractures of ribs, unspecified side, initial encounter for closed fracture: Secondary | ICD-10-CM | POA: Diagnosis not present

## 2017-03-05 DIAGNOSIS — K219 Gastro-esophageal reflux disease without esophagitis: Secondary | ICD-10-CM | POA: Diagnosis not present

## 2017-03-05 DIAGNOSIS — E119 Type 2 diabetes mellitus without complications: Secondary | ICD-10-CM | POA: Diagnosis not present

## 2017-03-06 DIAGNOSIS — S2249XA Multiple fractures of ribs, unspecified side, initial encounter for closed fracture: Secondary | ICD-10-CM | POA: Diagnosis not present

## 2017-03-06 DIAGNOSIS — S36039A Unspecified laceration of spleen, initial encounter: Secondary | ICD-10-CM | POA: Diagnosis not present

## 2017-03-13 DIAGNOSIS — R600 Localized edema: Secondary | ICD-10-CM | POA: Diagnosis not present

## 2017-03-13 DIAGNOSIS — R0602 Shortness of breath: Secondary | ICD-10-CM | POA: Diagnosis not present

## 2017-03-16 DIAGNOSIS — J189 Pneumonia, unspecified organism: Secondary | ICD-10-CM | POA: Diagnosis not present

## 2017-03-16 DIAGNOSIS — I998 Other disorder of circulatory system: Secondary | ICD-10-CM | POA: Diagnosis not present

## 2017-03-19 DIAGNOSIS — S32810D Multiple fractures of pelvis with stable disruption of pelvic ring, subsequent encounter for fracture with routine healing: Secondary | ICD-10-CM | POA: Diagnosis not present

## 2017-03-26 DIAGNOSIS — S36039A Unspecified laceration of spleen, initial encounter: Secondary | ICD-10-CM | POA: Diagnosis not present

## 2017-03-26 DIAGNOSIS — S060X9A Concussion with loss of consciousness of unspecified duration, initial encounter: Secondary | ICD-10-CM | POA: Diagnosis not present

## 2017-03-26 DIAGNOSIS — S2249XA Multiple fractures of ribs, unspecified side, initial encounter for closed fracture: Secondary | ICD-10-CM | POA: Diagnosis not present

## 2017-03-27 DIAGNOSIS — J918 Pleural effusion in other conditions classified elsewhere: Secondary | ICD-10-CM | POA: Diagnosis not present

## 2017-03-27 DIAGNOSIS — R0602 Shortness of breath: Secondary | ICD-10-CM | POA: Diagnosis not present

## 2017-03-27 DIAGNOSIS — J189 Pneumonia, unspecified organism: Secondary | ICD-10-CM | POA: Diagnosis not present

## 2017-03-28 DIAGNOSIS — R1013 Epigastric pain: Secondary | ICD-10-CM | POA: Diagnosis not present

## 2017-03-30 DIAGNOSIS — J189 Pneumonia, unspecified organism: Secondary | ICD-10-CM | POA: Diagnosis not present

## 2017-03-30 DIAGNOSIS — M6281 Muscle weakness (generalized): Secondary | ICD-10-CM | POA: Diagnosis not present

## 2017-04-09 DIAGNOSIS — I10 Essential (primary) hypertension: Secondary | ICD-10-CM | POA: Diagnosis not present

## 2017-04-09 DIAGNOSIS — E7800 Pure hypercholesterolemia, unspecified: Secondary | ICD-10-CM | POA: Diagnosis not present

## 2017-04-09 DIAGNOSIS — E1165 Type 2 diabetes mellitus with hyperglycemia: Secondary | ICD-10-CM | POA: Diagnosis not present

## 2017-04-09 DIAGNOSIS — Z0000 Encounter for general adult medical examination without abnormal findings: Secondary | ICD-10-CM | POA: Diagnosis not present

## 2017-04-09 DIAGNOSIS — R69 Illness, unspecified: Secondary | ICD-10-CM | POA: Diagnosis not present

## 2017-05-04 ENCOUNTER — Other Ambulatory Visit: Payer: Self-pay

## 2017-05-04 ENCOUNTER — Ambulatory Visit (INDEPENDENT_AMBULATORY_CARE_PROVIDER_SITE_OTHER): Payer: Medicare HMO

## 2017-05-04 ENCOUNTER — Encounter: Payer: Self-pay | Admitting: Physician Assistant

## 2017-05-04 ENCOUNTER — Ambulatory Visit (INDEPENDENT_AMBULATORY_CARE_PROVIDER_SITE_OTHER): Payer: Medicare HMO | Admitting: Physician Assistant

## 2017-05-04 VITALS — BP 122/68 | HR 110 | Temp 98.5°F | Resp 18 | Ht 62.0 in | Wt 136.6 lb

## 2017-05-04 DIAGNOSIS — R05 Cough: Secondary | ICD-10-CM

## 2017-05-04 DIAGNOSIS — R059 Cough, unspecified: Secondary | ICD-10-CM

## 2017-05-04 DIAGNOSIS — J189 Pneumonia, unspecified organism: Secondary | ICD-10-CM

## 2017-05-04 DIAGNOSIS — I509 Heart failure, unspecified: Secondary | ICD-10-CM | POA: Diagnosis not present

## 2017-05-04 DIAGNOSIS — J90 Pleural effusion, not elsewhere classified: Secondary | ICD-10-CM

## 2017-05-04 LAB — POCT CBC
Granulocyte percent: 84.4 % — AB (ref 37–80)
HCT, POC: 35.8 % — AB (ref 37.7–47.9)
Hemoglobin: 11.9 g/dL — AB (ref 12.2–16.2)
Lymph, poc: 1.3 (ref 0.6–3.4)
MCH, POC: 27.3 pg (ref 27–31.2)
MCHC: 33.3 g/dL (ref 31.8–35.4)
MCV: 82 fL (ref 80–97)
MID (cbc): 0.4 (ref 0–0.9)
MPV: 9.1 fL (ref 0–99.8)
POC Granulocyte: 9.5 — AB (ref 2–6.9)
POC LYMPH PERCENT: 11.9 % (ref 10–50)
POC MID %: 3.7 % (ref 0–12)
Platelet Count, POC: 450 10*3/uL — AB (ref 142–424)
RBC: 4.36 M/uL (ref 4.04–5.48)
RDW, POC: 16 %
WBC: 11.3 10*3/uL — AB (ref 4.6–10.2)

## 2017-05-04 MED ORDER — AMOXICILLIN-POT CLAVULANATE ER 1000-62.5 MG PO TB12: 2.0000 | ORAL_TABLET | Freq: Two times a day (BID) | ORAL | 0 refills | Status: AC

## 2017-05-04 MED ORDER — DOXYCYCLINE HYCLATE 100 MG PO CAPS
100.0000 mg | ORAL_CAPSULE | Freq: Two times a day (BID) | ORAL | 0 refills | Status: AC
Start: 2017-05-04 — End: 2017-05-09

## 2017-05-04 MED ORDER — IPRATROPIUM BROMIDE 0.02 % IN SOLN
0.5000 mg | Freq: Once | RESPIRATORY_TRACT | Status: AC
  Administered 2017-05-04: 0.5 mg via RESPIRATORY_TRACT

## 2017-05-04 MED ORDER — ALBUTEROL SULFATE (2.5 MG/3ML) 0.083% IN NEBU
2.5000 mg | INHALATION_SOLUTION | Freq: Once | RESPIRATORY_TRACT | Status: AC
  Administered 2017-05-04: 2.5 mg via RESPIRATORY_TRACT
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
## 2017-05-04 NOTE — Patient Instructions (Addendum)
Make an appt with your cardiologist for early January.  You will receive a phone call to schedule an appt with pulmonology. Please come back and see me in 1 week.   Start taking your antibiotics as soon as you can. You will be taking 2 antibiotics at the same time.  - amoxicillin-clavulanate (AUGMENTIN XR) 1000-62.5 MG; Take 2 tablets by mouth 2 (two) times daily for 5 days.  - doxycycline (VIBRAMYCIN) 100 MG capsule; Take 1 capsule (100 mg total) by mouth 2 (two) times daily for 5 days.    Thank you for coming in today. I hope you feel we met your needs.  Feel free to call PCP if you have any questions or further requests.  Please consider signing up for MyChart if you do not already have it, as this is a great way to communicate with me.  Best,  Whitney McVey, PA-C   IF you received an x-ray today, you will receive an invoice from [facility]. Please contact [facility] at [phone] with questions or concerns regarding your invoice.   IF you received labwork today, you will receive an invoice from [facility]. Please contact [facility] at [phone] with questions or concerns regarding your invoice.   Our billing staff will not be able to assist you with questions regarding bills from these companies.  You will be contacted with the lab results as soon as they are available. The fastest way to get your results is to activate your My Chart account. Instructions are located on the last page of this paperwork. If you have not heard from [place] regarding the results in 2 weeks, please contact this office.

## 2017-05-05 LAB — CMP14+EGFR
ALT: 14 IU/L (ref 0–32)
AST: 24 IU/L (ref 0–40)
Albumin/Globulin Ratio: 1.2 (ref 1.2–2.2)
Albumin: 4 g/dL (ref 3.5–4.8)
Alkaline Phosphatase: 133 IU/L — ABNORMAL HIGH (ref 39–117)
BUN/Creatinine Ratio: 18 (ref 12–28)
BUN: 16 mg/dL (ref 8–27)
Bilirubin Total: 0.4 mg/dL (ref 0.0–1.2)
CO2: 20 mmol/L (ref 20–29)
Calcium: 9.7 mg/dL (ref 8.7–10.3)
Chloride: 95 mmol/L — ABNORMAL LOW (ref 96–106)
Creatinine, Ser: 0.9 mg/dL (ref 0.57–1.00)
GFR calc Af Amer: 73 mL/min/{1.73_m2} (ref >59)
GFR calc non Af Amer: 64 mL/min/{1.73_m2} (ref >59)
Globulin, Total: 3.3 g/dL (ref 1.5–4.5)
Glucose: 349 mg/dL — ABNORMAL HIGH (ref 65–99)
Potassium: 5.1 mmol/L (ref 3.5–5.2)
Sodium: 137 mmol/L (ref 134–144)
Total Protein: 7.3 g/dL (ref 6.0–8.5)

## 2017-05-05 LAB — BRAIN NATRIURETIC PEPTIDE: BNP: 2336.9 pg/mL — ABNORMAL HIGH (ref 0.0–100.0)

## 2017-05-07 DIAGNOSIS — R69 Illness, unspecified: Secondary | ICD-10-CM | POA: Diagnosis not present

## 2017-05-10 ENCOUNTER — Emergency Department (HOSPITAL_COMMUNITY): Payer: Medicare HMO

## 2017-05-10 ENCOUNTER — Telehealth: Payer: Self-pay

## 2017-05-10 ENCOUNTER — Inpatient Hospital Stay (HOSPITAL_COMMUNITY)
Admission: EM | Admit: 2017-05-10 | Discharge: 2017-05-20 | DRG: 853 | Disposition: A | Discharge status: Home / Self Care | Payer: Medicare HMO | Attending: Internal Medicine | Admitting: Internal Medicine

## 2017-05-10 ENCOUNTER — Encounter (HOSPITAL_COMMUNITY): Payer: Self-pay | Admitting: Emergency Medicine

## 2017-05-10 ENCOUNTER — Other Ambulatory Visit: Payer: Self-pay

## 2017-05-10 DIAGNOSIS — Z809 Family history of malignant neoplasm, unspecified: Secondary | ICD-10-CM

## 2017-05-10 DIAGNOSIS — I110 Hypertensive heart disease with heart failure: Secondary | ICD-10-CM | POA: Diagnosis present

## 2017-05-10 DIAGNOSIS — I340 Nonrheumatic mitral (valve) insufficiency: Secondary | ICD-10-CM | POA: Diagnosis present

## 2017-05-10 DIAGNOSIS — Z955 Presence of coronary angioplasty implant and graft: Secondary | ICD-10-CM | POA: Diagnosis not present

## 2017-05-10 DIAGNOSIS — E119 Type 2 diabetes mellitus without complications: Secondary | ICD-10-CM

## 2017-05-10 DIAGNOSIS — E11649 Type 2 diabetes mellitus with hypoglycemia without coma: Secondary | ICD-10-CM | POA: Diagnosis present

## 2017-05-10 DIAGNOSIS — E785 Hyperlipidemia, unspecified: Secondary | ICD-10-CM | POA: Diagnosis present

## 2017-05-10 DIAGNOSIS — J90 Pleural effusion, not elsewhere classified: Secondary | ICD-10-CM

## 2017-05-10 DIAGNOSIS — Z951 Presence of aortocoronary bypass graft: Secondary | ICD-10-CM

## 2017-05-10 DIAGNOSIS — Z8349 Family history of other endocrine, nutritional and metabolic diseases: Secondary | ICD-10-CM | POA: Diagnosis not present

## 2017-05-10 DIAGNOSIS — I214 Non-ST elevation (NSTEMI) myocardial infarction: Secondary | ICD-10-CM | POA: Diagnosis present

## 2017-05-10 DIAGNOSIS — Z90710 Acquired absence of both cervix and uterus: Secondary | ICD-10-CM | POA: Diagnosis not present

## 2017-05-10 DIAGNOSIS — I509 Heart failure, unspecified: Secondary | ICD-10-CM | POA: Diagnosis not present

## 2017-05-10 DIAGNOSIS — Z9889 Other specified postprocedural states: Secondary | ICD-10-CM | POA: Diagnosis not present

## 2017-05-10 DIAGNOSIS — Z8249 Family history of ischemic heart disease and other diseases of the circulatory system: Secondary | ICD-10-CM | POA: Diagnosis not present

## 2017-05-10 DIAGNOSIS — J189 Pneumonia, unspecified organism: Secondary | ICD-10-CM | POA: Diagnosis present

## 2017-05-10 DIAGNOSIS — I5043 Acute on chronic combined systolic (congestive) and diastolic (congestive) heart failure: Secondary | ICD-10-CM | POA: Diagnosis present

## 2017-05-10 DIAGNOSIS — E039 Hypothyroidism, unspecified: Secondary | ICD-10-CM | POA: Diagnosis present

## 2017-05-10 DIAGNOSIS — F419 Anxiety disorder, unspecified: Secondary | ICD-10-CM | POA: Diagnosis present

## 2017-05-10 DIAGNOSIS — I2510 Atherosclerotic heart disease of native coronary artery without angina pectoris: Secondary | ICD-10-CM | POA: Diagnosis present

## 2017-05-10 DIAGNOSIS — Z79890 Hormone replacement therapy (postmenopausal): Secondary | ICD-10-CM | POA: Diagnosis not present

## 2017-05-10 DIAGNOSIS — F329 Major depressive disorder, single episode, unspecified: Secondary | ICD-10-CM | POA: Diagnosis present

## 2017-05-10 DIAGNOSIS — Z833 Family history of diabetes mellitus: Secondary | ICD-10-CM

## 2017-05-10 DIAGNOSIS — E875 Hyperkalemia: Secondary | ICD-10-CM | POA: Diagnosis present

## 2017-05-10 DIAGNOSIS — R0602 Shortness of breath: Secondary | ICD-10-CM | POA: Diagnosis present

## 2017-05-10 DIAGNOSIS — Z794 Long term (current) use of insulin: Secondary | ICD-10-CM | POA: Diagnosis not present

## 2017-05-10 DIAGNOSIS — Z7982 Long term (current) use of aspirin: Secondary | ICD-10-CM

## 2017-05-10 DIAGNOSIS — E872 Acidosis: Secondary | ICD-10-CM | POA: Diagnosis present

## 2017-05-10 DIAGNOSIS — E876 Hypokalemia: Secondary | ICD-10-CM | POA: Diagnosis not present

## 2017-05-10 DIAGNOSIS — E1152 Type 2 diabetes mellitus with diabetic peripheral angiopathy with gangrene: Secondary | ICD-10-CM | POA: Diagnosis not present

## 2017-05-10 DIAGNOSIS — R0609 Other forms of dyspnea: Secondary | ICD-10-CM | POA: Diagnosis not present

## 2017-05-10 DIAGNOSIS — I272 Pulmonary hypertension, unspecified: Secondary | ICD-10-CM | POA: Diagnosis present

## 2017-05-10 DIAGNOSIS — I255 Ischemic cardiomyopathy: Secondary | ICD-10-CM | POA: Diagnosis present

## 2017-05-10 DIAGNOSIS — A419 Sepsis, unspecified organism: Principal | ICD-10-CM | POA: Diagnosis present

## 2017-05-10 DIAGNOSIS — I480 Paroxysmal atrial fibrillation: Secondary | ICD-10-CM | POA: Diagnosis present

## 2017-05-10 DIAGNOSIS — Z888 Allergy status to other drugs, medicaments and biological substances status: Secondary | ICD-10-CM

## 2017-05-10 DIAGNOSIS — J9601 Acute respiratory failure with hypoxia: Secondary | ICD-10-CM | POA: Diagnosis present

## 2017-05-10 DIAGNOSIS — I25708 Atherosclerosis of coronary artery bypass graft(s), unspecified, with other forms of angina pectoris: Secondary | ICD-10-CM | POA: Diagnosis not present

## 2017-05-10 LAB — BASIC METABOLIC PANEL
Anion gap: 14 (ref 5–15)
BUN: 14 mg/dL (ref 6–20)
CALCIUM: 9.9 mg/dL (ref 8.9–10.3)
CO2: 24 mmol/L (ref 22–32)
CREATININE: 0.99 mg/dL (ref 0.44–1.00)
Chloride: 94 mmol/L — ABNORMAL LOW (ref 101–111)
GFR calc Af Amer: >60 mL/min (ref >60)
GFR, EST NON AFRICAN AMERICAN: 55 mL/min — AB (ref >60)
GLUCOSE: 494 mg/dL — AB (ref 65–99)
Potassium: 5.6 mmol/L — ABNORMAL HIGH (ref 3.5–5.1)
SODIUM: 132 mmol/L — AB (ref 135–145)

## 2017-05-10 LAB — CBC
HCT: 37.6 % (ref 36.0–46.0)
Hemoglobin: 11.4 g/dL — ABNORMAL LOW (ref 12.0–15.0)
MCH: 26.2 pg (ref 26.0–34.0)
MCHC: 30.3 g/dL (ref 30.0–36.0)
MCV: 86.4 fL (ref 78.0–100.0)
PLATELETS: 445 10*3/uL — AB (ref 150–400)
RBC: 4.35 MIL/uL (ref 3.87–5.11)
RDW: 15.7 % — AB (ref 11.5–15.5)
WBC: 11.7 10*3/uL — AB (ref 4.0–10.5)

## 2017-05-10 LAB — HEPATIC FUNCTION PANEL
ALBUMIN: 3.4 g/dL — AB (ref 3.5–5.0)
ALK PHOS: 133 U/L — AB (ref 38–126)
ALT: 15 U/L (ref 14–54)
AST: 40 U/L (ref 15–41)
BILIRUBIN INDIRECT: 0.4 mg/dL (ref 0.3–0.9)
BILIRUBIN TOTAL: 0.6 mg/dL (ref 0.3–1.2)
Bilirubin, Direct: 0.2 mg/dL (ref 0.1–0.5)
TOTAL PROTEIN: 7.3 g/dL (ref 6.5–8.1)

## 2017-05-10 LAB — I-STAT TROPONIN, ED: Troponin i, poc: 9.97 ng/mL (ref 0.00–0.08)

## 2017-05-10 LAB — TROPONIN I: Troponin I: 8.59 ng/mL (ref <0.03)

## 2017-05-10 LAB — I-STAT CG4 LACTIC ACID, ED
Lactic Acid, Venous: 4.17 mmol/L (ref 0.5–1.9)
Lactic Acid, Venous: 2.69 mmol/L (ref 0.5–1.9)

## 2017-05-10 MED ORDER — ASPIRIN 81 MG PO CHEW
324.0000 mg | CHEWABLE_TABLET | Freq: Once | ORAL | Status: AC
  Administered 2017-05-10: 324 mg via ORAL
  Filled 2017-05-10: qty 4

## 2017-05-10 MED ORDER — HEPARIN BOLUS VIA INFUSION
3000.0000 [IU] | Freq: Once | INTRAVENOUS | Status: AC
  Administered 2017-05-11: 3000 [IU] via INTRAVENOUS
  Filled 2017-05-10: qty 3000

## 2017-05-10 MED ORDER — IOPAMIDOL (ISOVUE-370) INJECTION 76%
INTRAVENOUS | Status: AC
  Administered 2017-05-10: 100 mL
  Filled 2017-05-10: qty 100

## 2017-05-10 MED ORDER — FUROSEMIDE 10 MG/ML IJ SOLN
40.0000 mg | Freq: Once | INTRAMUSCULAR | Status: AC
  Administered 2017-05-11: 40 mg via INTRAVENOUS
  Filled 2017-05-10: qty 4

## 2017-05-10 MED ORDER — HEPARIN (PORCINE) IN NACL 100-0.45 UNIT/ML-% IJ SOLN
1250.0000 [IU]/h | INTRAMUSCULAR | Status: DC
  Administered 2017-05-11: 1100 [IU]/h via INTRAVENOUS
  Administered 2017-05-11: 750 [IU]/h via INTRAVENOUS
  Administered 2017-05-12 – 2017-05-14 (×3): 1250 [IU]/h via INTRAVENOUS
  Filled 2017-05-10 (×8): qty 250

## 2017-05-10 NOTE — Telephone Encounter (Signed)
Called patient to see if she wanted to complete AWV tomorrow, 1/4 while she was in the office for another appointment but she is still not feeling well and said she would schedule it when she is feeling better.    Sherle PoeNicole Geoffrey Mankin, B.A.  Care Guide - [facility] [phone]

## 2017-05-10 NOTE — Progress Notes (Signed)
ANTICOAGULATION CONSULT NOTE - Initial Consult  Pharmacy Consult for Heparin  Indication: chest pain/ACS  Allergies  Allergen Reactions  . Crestor [Rosuvastatin] Other (See Comments)    unknown  . Lipitor [Atorvastatin] Other (See Comments)    Very bad pain in hips and joints    Patient Measurements: Height: 5\' 2"  (157.5 cm) Weight: 135 lb (61.2 kg) IBW/kg (Calculated) : 50.1  Vital Signs: Temp: 98.4 F (36.9 C) (01/03 1954) Temp Source: Oral (01/03 1954) BP: 143/83 (01/03 2230) Pulse Rate: 100 (01/03 2230)  Labs: Recent Labs    05/10/17 2005 05/10/17 2120  HGB 11.4*  --   HCT 37.6  --   PLT 445*  --   CREATININE 0.99  --   TROPONINI  --  8.59*    Estimated Creatinine Clearance: 43.5 mL/min (by C-G formula based on SCr of 0.99 mg/dL).   Medical History: Past Medical History:  Diagnosis Date  . Cholelithiases   . Coronary artery disease 02/2007   CABG  . Diabetes (HCC)   . Dyslipidemia   . HTN (hypertension)   . Hypothyroid   . PAF (paroxysmal atrial fibrillation) (HCC)    post op  . S/P CABG x 4 2008   Assessment: 72 y/o F here with declining health since DC to rehab facility. Having weakness, fever, chills, shortness of breath. Pt found to have elevated troponin. Starting heparin. PTA meds reviewed. Hgb 11.4. Renal function good.   Goal of Therapy:  Heparin level 0.3-0.7 units/ml Monitor platelets by anticoagulation protocol: Yes   Plan:  Heparin 3000 units BOLUS Start heparin drip at 750 units/hr 0800 HL Daily CBC/HL Monitor for bleeding   Abran DukeLedford, Khanh Tanori 05/10/2017,11:50 PM

## 2017-05-10 NOTE — ED Notes (Signed)
Pt reports she has been having to crush her meds and take them with apple sauce

## 2017-05-10 NOTE — ED Notes (Signed)
MD at bedside. 

## 2017-05-10 NOTE — ED Notes (Signed)
Patient transported to CT 
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
## 2017-05-11 ENCOUNTER — Inpatient Hospital Stay (HOSPITAL_COMMUNITY): Payer: Medicare HMO

## 2017-05-11 ENCOUNTER — Encounter (HOSPITAL_COMMUNITY): Payer: Self-pay | Admitting: General Surgery

## 2017-05-11 ENCOUNTER — Ambulatory Visit: Payer: Medicare HMO | Admitting: Physician Assistant

## 2017-05-11 ENCOUNTER — Encounter (HOSPITAL_COMMUNITY)
Admission: EM | Disposition: A | Discharge status: Home / Self Care | Payer: Self-pay | Source: Home / Self Care | Attending: Internal Medicine

## 2017-05-11 DIAGNOSIS — R0602 Shortness of breath: Secondary | ICD-10-CM

## 2017-05-11 DIAGNOSIS — E1152 Type 2 diabetes mellitus with diabetic peripheral angiopathy with gangrene: Secondary | ICD-10-CM | POA: Diagnosis not present

## 2017-05-11 DIAGNOSIS — I2510 Atherosclerotic heart disease of native coronary artery without angina pectoris: Secondary | ICD-10-CM | POA: Diagnosis present

## 2017-05-11 DIAGNOSIS — Z794 Long term (current) use of insulin: Secondary | ICD-10-CM

## 2017-05-11 DIAGNOSIS — J90 Pleural effusion, not elsewhere classified: Secondary | ICD-10-CM | POA: Diagnosis not present

## 2017-05-11 DIAGNOSIS — Z7982 Long term (current) use of aspirin: Secondary | ICD-10-CM | POA: Diagnosis not present

## 2017-05-11 DIAGNOSIS — R0609 Other forms of dyspnea: Secondary | ICD-10-CM | POA: Diagnosis not present

## 2017-05-11 DIAGNOSIS — I214 Non-ST elevation (NSTEMI) myocardial infarction: Secondary | ICD-10-CM | POA: Diagnosis present

## 2017-05-11 DIAGNOSIS — J9601 Acute respiratory failure with hypoxia: Secondary | ICD-10-CM | POA: Diagnosis present

## 2017-05-11 DIAGNOSIS — F329 Major depressive disorder, single episode, unspecified: Secondary | ICD-10-CM | POA: Diagnosis present

## 2017-05-11 DIAGNOSIS — I340 Nonrheumatic mitral (valve) insufficiency: Secondary | ICD-10-CM | POA: Diagnosis not present

## 2017-05-11 DIAGNOSIS — I5043 Acute on chronic combined systolic (congestive) and diastolic (congestive) heart failure: Secondary | ICD-10-CM | POA: Diagnosis present

## 2017-05-11 DIAGNOSIS — Z9889 Other specified postprocedural states: Secondary | ICD-10-CM | POA: Diagnosis not present

## 2017-05-11 DIAGNOSIS — E872 Acidosis: Secondary | ICD-10-CM | POA: Diagnosis present

## 2017-05-11 DIAGNOSIS — E875 Hyperkalemia: Secondary | ICD-10-CM

## 2017-05-11 DIAGNOSIS — F419 Anxiety disorder, unspecified: Secondary | ICD-10-CM | POA: Diagnosis present

## 2017-05-11 DIAGNOSIS — I25708 Atherosclerosis of coronary artery bypass graft(s), unspecified, with other forms of angina pectoris: Secondary | ICD-10-CM | POA: Diagnosis not present

## 2017-05-11 DIAGNOSIS — Z8249 Family history of ischemic heart disease and other diseases of the circulatory system: Secondary | ICD-10-CM | POA: Diagnosis not present

## 2017-05-11 DIAGNOSIS — E039 Hypothyroidism, unspecified: Secondary | ICD-10-CM

## 2017-05-11 DIAGNOSIS — Z809 Family history of malignant neoplasm, unspecified: Secondary | ICD-10-CM | POA: Diagnosis not present

## 2017-05-11 DIAGNOSIS — A419 Sepsis, unspecified organism: Secondary | ICD-10-CM | POA: Diagnosis present

## 2017-05-11 DIAGNOSIS — Z888 Allergy status to other drugs, medicaments and biological substances status: Secondary | ICD-10-CM | POA: Diagnosis not present

## 2017-05-11 DIAGNOSIS — Z955 Presence of coronary angioplasty implant and graft: Secondary | ICD-10-CM | POA: Diagnosis not present

## 2017-05-11 DIAGNOSIS — Z8349 Family history of other endocrine, nutritional and metabolic diseases: Secondary | ICD-10-CM | POA: Diagnosis not present

## 2017-05-11 DIAGNOSIS — Z951 Presence of aortocoronary bypass graft: Secondary | ICD-10-CM | POA: Diagnosis not present

## 2017-05-11 DIAGNOSIS — Z79890 Hormone replacement therapy (postmenopausal): Secondary | ICD-10-CM | POA: Diagnosis not present

## 2017-05-11 DIAGNOSIS — J189 Pneumonia, unspecified organism: Secondary | ICD-10-CM | POA: Diagnosis present

## 2017-05-11 DIAGNOSIS — Z90710 Acquired absence of both cervix and uterus: Secondary | ICD-10-CM | POA: Diagnosis not present

## 2017-05-11 DIAGNOSIS — E785 Hyperlipidemia, unspecified: Secondary | ICD-10-CM | POA: Diagnosis present

## 2017-05-11 DIAGNOSIS — I480 Paroxysmal atrial fibrillation: Secondary | ICD-10-CM | POA: Diagnosis present

## 2017-05-11 DIAGNOSIS — E11649 Type 2 diabetes mellitus with hypoglycemia without coma: Secondary | ICD-10-CM | POA: Diagnosis present

## 2017-05-11 DIAGNOSIS — I110 Hypertensive heart disease with heart failure: Secondary | ICD-10-CM | POA: Diagnosis present

## 2017-05-11 DIAGNOSIS — Z833 Family history of diabetes mellitus: Secondary | ICD-10-CM | POA: Diagnosis not present

## 2017-05-11 HISTORY — PX: IR THORACENTESIS ASP PLEURAL SPACE W/IMG GUIDE: IMG5380

## 2017-05-11 LAB — HEPARIN LEVEL (UNFRACTIONATED)
Heparin Unfractionated: <0.1 IU/mL — ABNORMAL LOW (ref 0.30–0.70)
Heparin Unfractionated: 0.17 IU/mL — ABNORMAL LOW (ref 0.30–0.70)

## 2017-05-11 LAB — URINALYSIS, ROUTINE W REFLEX MICROSCOPIC
BILIRUBIN URINE: NEGATIVE
Bacteria, UA: NONE SEEN
HGB URINE DIPSTICK: NEGATIVE
Ketones, ur: 5 mg/dL — AB
Leukocytes, UA: NEGATIVE
Nitrite: NEGATIVE
Protein, ur: NEGATIVE mg/dL
Specific Gravity, Urine: 1.027 (ref 1.005–1.030)
pH: 5 (ref 5.0–8.0)

## 2017-05-11 LAB — BASIC METABOLIC PANEL
Anion gap: 10 (ref 5–15)
BUN: 13 mg/dL (ref 6–20)
CO2: 31 mmol/L (ref 22–32)
Calcium: 9.5 mg/dL (ref 8.9–10.3)
Chloride: 97 mmol/L — ABNORMAL LOW (ref 101–111)
Creatinine, Ser: 0.99 mg/dL (ref 0.44–1.00)
GFR calc Af Amer: >60 mL/min (ref >60)
GFR calc non Af Amer: 55 mL/min — ABNORMAL LOW (ref >60)
Glucose, Bld: 80 mg/dL (ref 65–99)
POTASSIUM: 4.1 mmol/L (ref 3.5–5.1)
SODIUM: 138 mmol/L (ref 135–145)

## 2017-05-11 LAB — GLUCOSE, CAPILLARY
GLUCOSE-CAPILLARY: 127 mg/dL — AB (ref 65–99)
Glucose-Capillary: 126 mg/dL — ABNORMAL HIGH (ref 65–99)
Glucose-Capillary: 82 mg/dL (ref 65–99)

## 2017-05-11 LAB — TSH: TSH: 3.693 u[IU]/mL (ref 0.350–4.500)

## 2017-05-11 LAB — TROPONIN I
Troponin I: 9.85 ng/mL (ref <0.03)
Troponin I: 9.41 ng/mL (ref <0.03)
Troponin I: 7.34 ng/mL (ref <0.03)

## 2017-05-11 LAB — STREP PNEUMONIAE URINARY ANTIGEN: Strep Pneumo Urinary Antigen: NEGATIVE

## 2017-05-11 LAB — LACTIC ACID, PLASMA
Lactic Acid, Venous: 2.5 mmol/L (ref 0.5–1.9)
Lactic Acid, Venous: 3.3 mmol/L (ref 0.5–1.9)

## 2017-05-11 LAB — ALBUMIN, PLEURAL OR PERITONEAL FLUID: Albumin, Fluid: 1.7 g/dL

## 2017-05-11 LAB — CBG MONITORING, ED
GLUCOSE-CAPILLARY: 465 mg/dL — AB (ref 65–99)
GLUCOSE-CAPILLARY: 160 mg/dL — AB (ref 65–99)
Glucose-Capillary: 109 mg/dL — ABNORMAL HIGH (ref 65–99)

## 2017-05-11 LAB — HEMOGLOBIN A1C
Hgb A1c MFr Bld: 8.8 % — ABNORMAL HIGH (ref 4.8–5.6)
MEAN PLASMA GLUCOSE: 205.86 mg/dL

## 2017-05-11 LAB — PROCALCITONIN

## 2017-05-11 SURGERY — LEFT HEART CATH AND CORS/GRAFTS ANGIOGRAPHY
Anesthesia: LOCAL

## 2017-05-11 MED ORDER — ALPRAZOLAM 0.5 MG PO TABS
0.2500 mg | ORAL_TABLET | Freq: Once | ORAL | Status: AC
  Administered 2017-05-11: 0.25 mg via ORAL
  Filled 2017-05-11: qty 1

## 2017-05-11 MED ORDER — ALPRAZOLAM 0.25 MG PO TABS
0.2500 mg | ORAL_TABLET | Freq: Every evening | ORAL | Status: DC | PRN
  Administered 2017-05-16 – 2017-05-19 (×4): 0.25 mg via ORAL
  Filled 2017-05-11 (×4): qty 1

## 2017-05-11 MED ORDER — NITROGLYCERIN IN D5W 200-5 MCG/ML-% IV SOLN
0.0000 ug/min | INTRAVENOUS | Status: DC
  Administered 2017-05-11: 5 ug/min via INTRAVENOUS
  Filled 2017-05-11: qty 250

## 2017-05-11 MED ORDER — INSULIN GLARGINE 100 UNIT/ML ~~LOC~~ SOLN
14.0000 [IU] | Freq: Once | SUBCUTANEOUS | Status: AC
  Administered 2017-05-11: 14 [IU] via SUBCUTANEOUS
  Filled 2017-05-11: qty 0.14

## 2017-05-11 MED ORDER — INSULIN GLARGINE 100 UNIT/ML ~~LOC~~ SOLN
14.0000 [IU] | Freq: Every day | SUBCUTANEOUS | Status: DC
  Administered 2017-05-11: 14 [IU] via SUBCUTANEOUS
  Filled 2017-05-11: qty 0.14

## 2017-05-11 MED ORDER — CARVEDILOL 3.125 MG PO TABS
3.1250 mg | ORAL_TABLET | Freq: Two times a day (BID) | ORAL | Status: DC
  Administered 2017-05-11 – 2017-05-20 (×19): 3.125 mg via ORAL
  Filled 2017-05-11 (×20): qty 1

## 2017-05-11 MED ORDER — IPRATROPIUM-ALBUTEROL 0.5-2.5 (3) MG/3ML IN SOLN: 3.0000 mL | RESPIRATORY_TRACT | Status: DC | PRN

## 2017-05-11 MED ORDER — VANCOMYCIN HCL IN DEXTROSE 1-5 GM/200ML-% IV SOLN
1000.0000 mg | Freq: Once | INTRAVENOUS | Status: AC
  Administered 2017-05-11: 1000 mg via INTRAVENOUS
  Filled 2017-05-11: qty 200

## 2017-05-11 MED ORDER — LIDOCAINE HCL 1 % IJ SOLN
INTRAMUSCULAR | Status: AC
  Filled 2017-05-11: qty 20

## 2017-05-11 MED ORDER — ONDANSETRON HCL 4 MG/2ML IJ SOLN: 4.0000 mg | Freq: Four times a day (QID) | INTRAMUSCULAR | Status: DC | PRN

## 2017-05-11 MED ORDER — SODIUM CHLORIDE 0.9% FLUSH
3.0000 mL | Freq: Two times a day (BID) | INTRAVENOUS | Status: DC
  Administered 2017-05-11 – 2017-05-16 (×6): 3 mL via INTRAVENOUS

## 2017-05-11 MED ORDER — LOSARTAN POTASSIUM 25 MG PO TABS
25.0000 mg | ORAL_TABLET | Freq: Every day | ORAL | Status: DC
  Administered 2017-05-11: 25 mg via ORAL
  Filled 2017-05-11 (×3): qty 1

## 2017-05-11 MED ORDER — PIPERACILLIN-TAZOBACTAM 3.375 G IVPB 30 MIN
3.3750 g | Freq: Once | INTRAVENOUS | Status: AC
  Administered 2017-05-11: 3.375 g via INTRAVENOUS
  Filled 2017-05-11: qty 50

## 2017-05-11 MED ORDER — LIDOCAINE HCL 1 % IJ SOLN
INTRAMUSCULAR | Status: AC | PRN
  Administered 2017-05-11: 10 mL

## 2017-05-11 MED ORDER — FUROSEMIDE 10 MG/ML IJ SOLN
40.0000 mg | Freq: Two times a day (BID) | INTRAMUSCULAR | Status: DC
  Administered 2017-05-11 – 2017-05-12 (×3): 40 mg via INTRAVENOUS
  Filled 2017-05-11 (×3): qty 4

## 2017-05-11 MED ORDER — INSULIN ASPART 100 UNIT/ML ~~LOC~~ SOLN
0.0000 [IU] | SUBCUTANEOUS | Status: DC
  Administered 2017-05-11: 9 [IU] via SUBCUTANEOUS
  Administered 2017-05-11: 2 [IU] via SUBCUTANEOUS
  Administered 2017-05-11 – 2017-05-12 (×2): 1 [IU] via SUBCUTANEOUS
  Administered 2017-05-12: 7 [IU] via SUBCUTANEOUS
  Administered 2017-05-13: 3 [IU] via SUBCUTANEOUS
  Administered 2017-05-13: 2 [IU] via SUBCUTANEOUS
  Administered 2017-05-14: 3 [IU] via SUBCUTANEOUS
  Administered 2017-05-14: 5 [IU] via SUBCUTANEOUS
  Administered 2017-05-14 (×2): 3 [IU] via SUBCUTANEOUS
  Administered 2017-05-14 – 2017-05-15 (×2): 1 [IU] via SUBCUTANEOUS
  Administered 2017-05-15: 3 [IU] via SUBCUTANEOUS
  Administered 2017-05-15: 5 [IU] via SUBCUTANEOUS
  Administered 2017-05-16 (×3): 2 [IU] via SUBCUTANEOUS
  Administered 2017-05-16: 5 [IU] via SUBCUTANEOUS
  Filled 2017-05-11 (×2): qty 1

## 2017-05-11 MED ORDER — EZETIMIBE-SIMVASTATIN 10-40 MG PO TABS
1.0000 | ORAL_TABLET | Freq: Every day | ORAL | Status: DC
  Administered 2017-05-12 – 2017-05-20 (×7): 1 via ORAL
  Filled 2017-05-11 (×10): qty 1

## 2017-05-11 MED ORDER — SODIUM CHLORIDE 0.9 % IV SOLN
250.0000 mL | INTRAVENOUS | Status: DC | PRN
  Administered 2017-05-13: 250 mL via INTRAVENOUS

## 2017-05-11 MED ORDER — ACETAMINOPHEN 325 MG PO TABS
650.0000 mg | ORAL_TABLET | ORAL | Status: DC | PRN
Start: 2017-05-11 — End: 2017-05-15

## 2017-05-11 MED ORDER — MORPHINE SULFATE (PF) 4 MG/ML IV SOLN
2.0000 mg | INTRAVENOUS | Status: DC | PRN
  Administered 2017-05-11: 2 mg via INTRAVENOUS
  Filled 2017-05-11: qty 1

## 2017-05-11 MED ORDER — VANCOMYCIN HCL IN DEXTROSE 750-5 MG/150ML-% IV SOLN
750.0000 mg | INTRAVENOUS | Status: DC
  Administered 2017-05-12 – 2017-05-14 (×3): 750 mg via INTRAVENOUS
  Filled 2017-05-11 (×4): qty 150

## 2017-05-11 MED ORDER — SODIUM CHLORIDE 0.9% FLUSH: 3.0000 mL | INTRAVENOUS | Status: DC | PRN

## 2017-05-11 MED ORDER — PIPERACILLIN-TAZOBACTAM 3.375 G IVPB
3.3750 g | Freq: Three times a day (TID) | INTRAVENOUS | Status: DC
Start: 2017-05-11 — End: 2017-05-16
  Administered 2017-05-11 – 2017-05-16 (×15): 3.375 g via INTRAVENOUS
  Filled 2017-05-11 (×17): qty 50

## 2017-05-11 MED ORDER — LEVOTHYROXINE SODIUM 50 MCG PO TABS
50.0000 ug | ORAL_TABLET | Freq: Every day | ORAL | Status: DC
  Administered 2017-05-12 – 2017-05-20 (×8): 50 ug via ORAL
  Filled 2017-05-11 (×11): qty 1

## 2017-05-11 MED ORDER — HEPARIN BOLUS VIA INFUSION
1500.0000 [IU] | Freq: Once | INTRAVENOUS | Status: AC
  Administered 2017-05-11: 1500 [IU] via INTRAVENOUS
  Filled 2017-05-11: qty 1500

## 2017-05-11 MED ORDER — DIPHENHYDRAMINE HCL 25 MG PO CAPS
25.0000 mg | ORAL_CAPSULE | Freq: Four times a day (QID) | ORAL | Status: DC | PRN
  Administered 2017-05-11 – 2017-05-13 (×3): 25 mg via ORAL
  Filled 2017-05-11 (×3): qty 1

## 2017-05-11 NOTE — ED Notes (Signed)
Pt on 2.5L [place] due to desat to 90%, pt now at 96%

## 2017-05-11 NOTE — ED Notes (Signed)
Pt went to IR.
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
## 2017-05-11 NOTE — Progress Notes (Signed)
Pharmacy Antibiotic Note  Michaelyn Barterolly K Deleon is a 72 y.o. female admitted on 05/10/2017 with fevers/SOB/PNA.  Pharmacy has been consulted for Vancomycin and Zosyn  dosing.  Plan: Vancomycin 1 g IV now, then 750 mg IV q24h Zosyn 3.375 g IV q8h   Height: 5\' 2"  (157.5 cm) Weight: 135 lb (61.2 kg) IBW/kg (Calculated) : 50.1  Temp (24hrs), Avg:98.4 F (36.9 C), Min:98.4 F (36.9 C), Max:98.4 F (36.9 C)  Recent Labs  Lab 05/04/17 0921 05/04/17 1523 05/10/17 2005 05/10/17 2032 05/10/17 2214 05/11/17 0242  WBC 11.3*  --  11.7*  --   --   --   CREATININE  --  0.90 0.99  --   --   --   LATICACIDVEN  --   --   --  4.17* 2.69* 2.5*    Estimated Creatinine Clearance: 43.5 mL/min (by C-G formula based on SCr of 0.99 mg/dL).    Allergies  Allergen Reactions  . Crestor [Rosuvastatin] Other (See Comments)    unknown  . Lipitor [Atorvastatin] Other (See Comments)    Very bad pain in hips and joints    Eddie Candlebbott, Keeyon Privitera Vernon 05/11/2017 4:08 AM

## 2017-05-11 NOTE — ED Notes (Signed)
Pt transferred to hospital bed for comfort.

## 2017-05-11 NOTE — ED Notes (Signed)
Cardiology at bedside.
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
## 2017-05-11 NOTE — ED Notes (Signed)
Pt back from IR 

## 2017-05-11 NOTE — Progress Notes (Signed)
Patient arrived the unit from ER on a hospital bed, vital signs obtained see flowsheet, placed on tele ccmd notified, patient oriented to room and staff, bed in lowest position, call bell in reach will continue to monitor.

## 2017-05-11 NOTE — Progress Notes (Signed)
ANTICOAGULATION CONSULT NOTE  Pharmacy Consult for Heparin  Indication: chest pain/ACS  Allergies  Allergen Reactions  . Crestor [Rosuvastatin] Other (See Comments)    unknown  . Lipitor [Atorvastatin] Other (See Comments)    Very bad pain in hips and joints    Patient Measurements: Height: 5\' 2"  (157.5 cm) Weight: 135 lb (61.2 kg) IBW/kg (Calculated) : 50.1  Vital Signs: Temp: 99.2 F (37.3 C) (01/04 1838) Temp Source: Oral (01/04 1838) BP: 134/82 (01/04 1838) Pulse Rate: 97 (01/04 1838)  Labs: Recent Labs    05/10/17 2005  05/11/17 0242 05/11/17 0423 05/11/17 0800 05/11/17 0936 05/11/17 1535 05/11/17 1842  HGB 11.4*  --   --   --   --   --   --   --   HCT 37.6  --   --   --   --   --   --   --   PLT 445*  --   --   --   --   --   --   --   HEPARINUNFRC  --   --   --   --  <0.10*  --   --  0.17*  CREATININE 0.99  --   --   --   --   --  0.99  --   TROPONINI  --    < > 9.85* 9.41*  --  7.34*  --   --    < > = values in this interval not displayed.    Estimated Creatinine Clearance: 43.5 mL/min (by C-G formula based on SCr of 0.99 mg/dL).   Medical History: Past Medical History:  Diagnosis Date  . Cholelithiases   . Coronary artery disease 02/2007   CABG  . Diabetes (HCC)   . Dyslipidemia   . HTN (hypertension)   . Hypothyroid   . PAF (paroxysmal atrial fibrillation) (HCC)    post op  . S/P CABG x 4 2008   Assessment: 72 y/o F here with declining health since DC to rehab facility. Having weakness, fever, chills, shortness of breath. Pt found to have elevated troponin. Pharmacy consulted to dose heparin for NSTEMI. Hx of afib noted but not on anticoagulation PTA. Hg 11.4, plt 445.  Heparin level = 0.17, still subtherapeutic after rate increase earlier today. No issues with IV line or bleeding per RN, and drip has not been off at all.  Goal of Therapy:  Heparin level 0.3-0.7 units/ml Monitor platelets by anticoagulation protocol: Yes   Plan:   Increase heparin drip to 1100 units/hr 8h heparin level at 0500 Daily CBC/heparin level Monitor for bleeding, f/u Cardiology plans   Bayard HuggerMei Eliel Dudding, PharmD, BCPS  Clinical Pharmacist  Pager: [phone]   05/11/2017 7:51 PM

## 2017-05-11 NOTE — Progress Notes (Signed)
ANTICOAGULATION CONSULT NOTE  Pharmacy Consult for Heparin  Indication: chest pain/ACS  Allergies  Allergen Reactions  . Crestor [Rosuvastatin] Other (See Comments)    unknown  . Lipitor [Atorvastatin] Other (See Comments)    Very bad pain in hips and joints    Patient Measurements: Height: 5\' 2"  (157.5 cm) Weight: 135 lb (61.2 kg) IBW/kg (Calculated) : 50.1  Vital Signs: BP: 121/76 (01/04 1000) Pulse Rate: 99 (01/04 1000)  Labs: Recent Labs    05/10/17 2005  05/11/17 0242 05/11/17 0423 05/11/17 0800 05/11/17 0936  HGB 11.4*  --   --   --   --   --   HCT 37.6  --   --   --   --   --   PLT 445*  --   --   --   --   --   HEPARINUNFRC  --   --   --   --  <0.10*  --   CREATININE 0.99  --   --   --   --   --   TROPONINI  --    < > 9.85* 9.41*  --  7.34*   < > = values in this interval not displayed.    Estimated Creatinine Clearance: 43.5 mL/min (by C-G formula based on SCr of 0.99 mg/dL).   Medical History: Past Medical History:  Diagnosis Date  . Cholelithiases   . Coronary artery disease 02/2007   CABG  . Diabetes (HCC)   . Dyslipidemia   . HTN (hypertension)   . Hypothyroid   . PAF (paroxysmal atrial fibrillation) (HCC)    post op  . S/P CABG x 4 2008   Assessment: 72 y/o F here with declining health since DC to rehab facility. Having weakness, fever, chills, shortness of breath. Pt found to have elevated troponin. Pharmacy consulted to dose heparin for NSTEMI. Hx of afib noted but not on anticoagulation PTA. Hg 11.4, plt 445.  Heparin level undetectable. No issues with IV line or bleeding per RN, and drip has not been off at all.  Goal of Therapy:  Heparin level 0.3-0.7 units/ml Monitor platelets by anticoagulation protocol: Yes   Plan:  Heparin 1500 units BOLUS Increase heparin drip to 950 units/hr 8h heparin level Daily CBC/heparin level Monitor for bleeding, f/u Cardiology plans   Babs BertinHaley Chrystine Frogge, PharmD, BCPS Clinical Pharmacist 05/11/2017  11:57 AM

## 2017-05-11 NOTE — Procedures (Signed)
Ultrasound-guided diagnostic and therapeutic left thoracentesis performed yielding 0.75 liters of serous colored fluid. No immediate complications. Follow-up chest x-ray pending.       [name] 2:58 PM 05/11/2017

## 2017-05-11 NOTE — ED Notes (Signed)
Pt's bed wet from purewick. Changed pt's bed and purewick, resting comfortably now.

## 2017-05-11 NOTE — Progress Notes (Signed)
[facility] PROGRESS NOTE    Progress Note  [name]  ZOX:[id number] DOB: 01/04/1946 DOA: 05/10/2017 PCP: Dorisann Frames, MD     Brief Narrative:   [name] is an 72 y.o. female past medical history of systolic heart failure the previous 2D echo that showed an EF of 20% is post CABG, paroxysmal atrial fibrillation diabetes mellitus type 2 who presents with complaints of chest pain and shortness of breath  Assessment/Plan:   Acute respiratory failure with hypoxia (HCC)/NSTEMI with acute on chronic combined systolic and diastolic congestive heart failure [facility]): Chest x-ray show bilateral pleural effusion with moderate interstitial infiltrate. Cardiology was consulted recommended IV heparin, 2D ECHO. Statin and to continue Coreg.  She continues to have chest pain and shortness of breath I will start her on IV nitroglycerin.  N.p.o. for possible cardiac cath. BNP was 2300.  SIRS: Likely due to N STEMI . He was started empirically on IV vancomycin and Zosyn culture data is pending.    Lactic acid: Improving after intervention we will continue to trend.  Diabetes mellitus type 2 with a last A1c of 10.2: Continue sliding scale insulin the patient is n.p.o. for cardiac cath.  Hyperkalemia: In the setting of ARB and Lasix use with new end STEMI and acute decompensated systolic heart failure. We will repeat this afternoon if continues to worsen might need a dose of Kayexalate.  Dyslipidemia: Continue statins.  Hypothyroidism: Continue Synthroid, I will not change her levo dose if it is abnormal. It will probably be abnormal due to her new N STEMI.  Left-sided pleural effusion: She continues to have persistent shortness of breath, will ask IR to see if left thoracocentesis can be performed.  As the patient will be unlikely to lay flat for cath due to her pleural effusion.  DVT prophylaxis: heparin Family Communication:daughter Disposition Plan/Barrier to D/C:  unable to determine Code Status:     Code Status Orders  (From admission, onward)        Start     Ordered   05/11/17 0152  Full code  Continuous     05/11/17 0157    Code Status History    Date Active Date Inactive Code Status Order ID Comments User Context   01/31/2017 00:28 02/19/2017 17:43 Full Code [id number]  Emelia Loron, MD Inpatient        IV Access:    Peripheral IV   Procedures and diagnostic studies:   Dg Chest 2 View  Result Date: 05/10/2017 CLINICAL DATA:  72 y/o F; weakness, fevers, chills, shortness of breath, recent diagnosis of pneumonia. EXAM: CHEST  2 VIEW COMPARISON:  05/04/2017 chest radiograph FINDINGS: Increased small right and stable moderate left pleural effusions. Bibasilar opacities. Pulmonary vascular congestion. Cardiomegaly largely obscured by lung base opacities. Status post CABG with sternotomy wires aligned. No pneumothorax. No acute osseous abnormality is evident. Calcific aortic atherosclerosis. IMPRESSION: Increased small right and stable moderate left pleural effusions. Bibasilar opacities may represent associated atelectasis or pneumonia. Pulmonary vascular congestion and cardiomegaly. Electronically Signed   By: Mitzi Hansen M.D.   On: 05/10/2017 20:26   Ct Angio Chest Pe W/cm &/or Wo Cm  Result Date: 05/10/2017 CLINICAL DATA:  Weakness, fevers, chills, shortness of breath. PE suspected, high pretest prob EXAM: CT ANGIOGRAPHY CHEST WITH CONTRAST TECHNIQUE: Multidetector CT imaging of the chest was performed using the standard protocol during bolus administration of intravenous contrast. Multiplanar CT image reconstructions and MIPs were obtained to evaluate the vascular anatomy. CONTRAST:  100mL ISOVUE-370 IOPAMIDOL (ISOVUE-370) INJECTION 76% COMPARISON:  Chest radiograph earlier this day, additional priors. Chest CT 01/30/2017 FINDINGS: Cardiovascular: There are no filling defects within the pulmonary arteries to suggest  pulmonary embolus. Right-sided aortic arch with aortic atherosclerosis. There are coronary artery calcifications. Multi chamber cardiomegaly. Contrast refluxes into the hepatic veins and IVC. No pericardial effusion. Mediastinum/Nodes: No definite enlarged mediastinal or hilar lymph nodes. Air-fluid level in the upper esophagus. Calcification in the right thyroid gland. Lungs/Pleura: Moderate to large right and moderate left pleural effusion with adjacent compressive atelectasis. Suspect mild pulmonary edema with septal thickening in the apices and ground-Aguas opacities in the perihilar regions. Upper Abdomen: Embolization coils at the splenic hilum. No upper abdominal free fluid. Musculoskeletal: Multiple remote left-sided rib fractures with surrounding callus, incomplete healing of many of the fractures. Remote fracture of lower right tenth rib fracture with surrounding callus. No acute osseous abnormality. Review of the MIP images confirms the above findings. IMPRESSION: 1. No pulmonary embolus. 2. CHF with moderate to large right and moderate left pleural effusion, cardiomegaly and pulmonary edema. Contrast refluxing into the hepatic veins and IVC consistent with elevated right heart pressure. 3. Right-sided aortic arch.  Aortic Atherosclerosis (ICD10-I70.0). Electronically Signed   By: Rubye OaksMelanie  Ehinger M.D.   On: 05/10/2017 23:45     Medical Consultants:    None.  Anti-Infectives:   IV vancomycin and Zosyn.  Subjective:    [name] relates she continues chest pain and shortness of breath.  Objective:    Vitals:   05/11/17 0300 05/11/17 0400 05/11/17 0500 05/11/17 0700  BP: (!) 146/94 (!) 149/91 (!) 145/77 133/80  Pulse: (!) 101 (!) 103 (!) 103 (!) 102  Resp: (!) 22 (!) 26 (!) 30 (!) 35  Temp:      TempSrc:      SpO2: 93% 94% 93% 95%  Weight:      Height:        Intake/Output Summary (Last 24 hours) at 05/11/2017 0823 Last data filed at 05/11/2017 0704 Gross per 24 hour    Intake 250 ml  Output -  Net 250 ml   Filed Weights   05/10/17 1955  Weight: 61.2 kg (135 lb)    Exam: General exam: In no acute distress. Respiratory system: Good air movement and crease air movement on the left and crackles on the right. Cardiovascular system: S1 & S2 heard, RRR. - JVD. Gastrointestinal system: Abdomen is nondistended, soft and nontender.  Central nervous system: Alert and oriented. No focal neurological deficits. Extremities: No pedal edema. Skin: No rashes, lesions or ulcers    Data Reviewed:    Labs: Basic Metabolic Panel: Recent Labs  Lab 05/04/17 1523 05/10/17 2005  NA 137 132*  K 5.1 5.6*  CL 95* 94*  CO2 20 24  GLUCOSE 349* 494*  BUN 16 14  CREATININE 0.90 0.99  CALCIUM 9.7 9.9   GFR Estimated Creatinine Clearance: 43.5 mL/min (by C-G formula based on SCr of 0.99 mg/dL). Liver Function Tests: Recent Labs  Lab 05/04/17 1523 05/10/17 2120  AST 24 40  ALT 14 15  ALKPHOS 133* 133*  BILITOT 0.4 0.6  PROT 7.3 7.3  ALBUMIN 4.0 3.4*   No results for input(s): LIPASE, AMYLASE in the last 168 hours. No results for input(s): AMMONIA in the last 168 hours. Coagulation profile No results for input(s): INR, PROTIME in the last 168 hours.  CBC: Recent Labs  Lab 05/04/17 0921 05/10/17 2005  WBC 11.3* 11.7*  HGB  11.9* 11.4*  HCT 35.8* 37.6  MCV 82.0 86.4  PLT  --  445*   Cardiac Enzymes: Recent Labs  Lab 05/10/17 2120 05/11/17 0242 05/11/17 0423  TROPONINI 8.59* 9.85* 9.41*   BNP (last 3 results) No results for input(s): PROBNP in the last 8760 hours. CBG: Recent Labs  Lab 05/11/17 0513  GLUCAP 465*   D-Dimer: No results for input(s): DDIMER in the last 72 hours. Hgb A1c: Recent Labs    05/11/17 0422  HGBA1C 8.8*   Lipid Profile: No results for input(s): CHOL, HDL, LDLCALC, TRIG, CHOLHDL, LDLDIRECT in the last 72 hours. Thyroid function studies: No results for input(s): TSH, T4TOTAL, T3FREE, THYROIDAB in the  last 72 hours.  Invalid input(s): FREET3 Anemia work up: No results for input(s): VITAMINB12, FOLATE, FERRITIN, TIBC, IRON, RETICCTPCT in the last 72 hours. Sepsis Labs: Recent Labs  Lab 05/04/17 0921 05/10/17 2005 05/10/17 2032 05/10/17 2214 05/11/17 0242 05/11/17 0423  PROCALCITON  --   --   --   --   --  <0.10  WBC 11.3* 11.7*  --   --   --   --   LATICACIDVEN  --   --  4.17* 2.69* 2.5* 3.3*   Microbiology No results found for this or any previous visit (from the past 240 hour(s)).   Medications:   . carvedilol  3.125 mg Oral BID WC  . ezetimibe-simvastatin  1 tablet Oral Daily  . furosemide  40 mg Intravenous BID  . insulin aspart  0-9 Units Subcutaneous Q4H  . insulin glargine  14 Units Subcutaneous QHS  . levothyroxine  50 mcg Oral QAC breakfast  . losartan  25 mg Oral Daily  . sodium chloride flush  3 mL Intravenous Q12H   Continuous Infusions: . sodium chloride    . heparin 750 Units/hr (05/11/17 0012)  . piperacillin-tazobactam (ZOSYN)  IV    . [START ON 05/12/2017] vancomycin       LOS: 0 days   Marinda Elk  [facility] Pager [phone]  *Please refer to [email or web address], password [email or web address] to get updated schedule on who will round on this patient, as hospitalists switch teams weekly. If 7PM-7AM, please contact night-coverage at [email or web address], password [email or web address] for any overnight needs.  05/11/2017, 8:23 AM
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
## 2017-05-12 ENCOUNTER — Inpatient Hospital Stay (HOSPITAL_COMMUNITY): Payer: Medicare HMO

## 2017-05-12 DIAGNOSIS — I340 Nonrheumatic mitral (valve) insufficiency: Secondary | ICD-10-CM

## 2017-05-12 LAB — BASIC METABOLIC PANEL
Anion gap: 10 (ref 5–15)
BUN: 13 mg/dL (ref 6–20)
CO2: 30 mmol/L (ref 22–32)
CREATININE: 0.99 mg/dL (ref 0.44–1.00)
Calcium: 8.8 mg/dL — ABNORMAL LOW (ref 8.9–10.3)
Chloride: 96 mmol/L — ABNORMAL LOW (ref 101–111)
GFR calc Af Amer: >60 mL/min (ref >60)
GFR, EST NON AFRICAN AMERICAN: 55 mL/min — AB (ref >60)
GLUCOSE: 51 mg/dL — AB (ref 65–99)
Potassium: 3.6 mmol/L (ref 3.5–5.1)
Sodium: 136 mmol/L (ref 135–145)

## 2017-05-12 LAB — ECHOCARDIOGRAM COMPLETE
HEIGHTINCHES: 62 in
WEIGHTICAEL: 2278.67 [oz_av]

## 2017-05-12 LAB — URINE CULTURE

## 2017-05-12 LAB — GLUCOSE, CAPILLARY
GLUCOSE-CAPILLARY: 47 mg/dL — AB (ref 65–99)
GLUCOSE-CAPILLARY: 302 mg/dL — AB (ref 65–99)
Glucose-Capillary: 108 mg/dL — ABNORMAL HIGH (ref 65–99)
Glucose-Capillary: 181 mg/dL — ABNORMAL HIGH (ref 65–99)
Glucose-Capillary: 136 mg/dL — ABNORMAL HIGH (ref 65–99)
Glucose-Capillary: 119 mg/dL — ABNORMAL HIGH (ref 65–99)

## 2017-05-12 LAB — CBC
HEMATOCRIT: 40.1 % (ref 36.0–46.0)
Hemoglobin: 12.1 g/dL (ref 12.0–15.0)
MCH: 26 pg (ref 26.0–34.0)
MCHC: 30.2 g/dL (ref 30.0–36.0)
MCV: 86.2 fL (ref 78.0–100.0)
PLATELETS: 401 10*3/uL — AB (ref 150–400)
RBC: 4.65 MIL/uL (ref 3.87–5.11)
RDW: 16 % — AB (ref 11.5–15.5)
WBC: 13 10*3/uL — ABNORMAL HIGH (ref 4.0–10.5)

## 2017-05-12 LAB — HEPARIN LEVEL (UNFRACTIONATED)
Heparin Unfractionated: 0.26 IU/mL — ABNORMAL LOW (ref 0.30–0.70)
Heparin Unfractionated: 0.39 IU/mL (ref 0.30–0.70)

## 2017-05-12 LAB — LEGIONELLA PNEUMOPHILA SEROGP 1 UR AG: L. pneumophila Serogp 1 Ur Ag: NEGATIVE

## 2017-05-12 LAB — LACTIC ACID, PLASMA: Lactic Acid, Venous: 3.2 mmol/L (ref 0.5–1.9)

## 2017-05-12 MED ORDER — INSULIN GLARGINE 100 UNIT/ML ~~LOC~~ SOLN: 8.0000 [IU] | Freq: Every day | SUBCUTANEOUS | Status: DC

## 2017-05-12 MED ORDER — DEXTROSE 50 % IV SOLN
25.0000 mL | Freq: Once | INTRAVENOUS | Status: AC
  Administered 2017-05-12: 25 mL via INTRAVENOUS

## 2017-05-12 MED ORDER — DEXTROSE 50 % IV SOLN
INTRAVENOUS | Status: AC
  Filled 2017-05-12: qty 50

## 2017-05-12 MED ORDER — FUROSEMIDE 10 MG/ML IJ SOLN
60.0000 mg | Freq: Two times a day (BID) | INTRAMUSCULAR | Status: DC
  Administered 2017-05-12 – 2017-05-16 (×8): 60 mg via INTRAVENOUS
  Filled 2017-05-12 (×8): qty 6

## 2017-05-12 MED ORDER — INSULIN GLARGINE 100 UNIT/ML ~~LOC~~ SOLN
8.0000 [IU] | Freq: Every day | SUBCUTANEOUS | Status: DC
  Administered 2017-05-13 – 2017-05-15 (×3): 8 [IU] via SUBCUTANEOUS
  Filled 2017-05-12 (×3): qty 0.08

## 2017-05-12 NOTE — Progress Notes (Signed)
ANTICOAGULATION CONSULT NOTE - Initial Consult  Pharmacy Consult for Heparin  Indication: chest pain/ACS  Allergies  Allergen Reactions  . Crestor [Rosuvastatin] Other (See Comments)    unknown  . Lipitor [Atorvastatin] Other (See Comments)    Very bad pain in hips and joints    Patient Measurements: Height: 5\' 2"  (157.5 cm) Weight: 135 lb (61.2 kg) IBW/kg (Calculated) : 50.1  Vital Signs: Temp: 98 F (36.7 C) (01/04 2359) Temp Source: Oral (01/04 2359) BP: 104/68 (01/04 2359) Pulse Rate: 85 (01/04 2359)  Labs: Recent Labs    05/10/17 2005  05/11/17 0242 05/11/17 0423 05/11/17 0800 05/11/17 0936 05/11/17 1535 05/11/17 1842 05/12/17 0235  HGB 11.4*  --   --   --   --   --   --   --  12.1  HCT 37.6  --   --   --   --   --   --   --  40.1  PLT 445*  --   --   --   --   --   --   --  401*  HEPARINUNFRC  --   --   --   --  <0.10*  --   --  0.17* 0.26*  CREATININE 0.99  --   --   --   --   --  0.99  --  0.99  TROPONINI  --    < > 9.85* 9.41*  --  7.34*  --   --   --    < > = values in this interval not displayed.    Estimated Creatinine Clearance: 43.5 mL/min (by C-G formula based on SCr of 0.99 mg/dL).   Medical History: Past Medical History:  Diagnosis Date  . Cholelithiases   . Coronary artery disease 02/2007   CABG  . Diabetes (HCC)   . Dyslipidemia   . HTN (hypertension)   . Hypothyroid   . PAF (paroxysmal atrial fibrillation) (HCC)    post op  . S/P CABG x 4 2008   Assessment: 72 y/o F here with declining health since DC to rehab facility. Having weakness, fever, chills, shortness of breath. Pt found to have elevated troponin, no trending down, on heparin, heparin level low despite rate increase  Goal of Therapy:  Heparin level 0.3-0.7 units/ml Monitor platelets by anticoagulation protocol: Yes   Plan:  Inc heparin to 1250 units/hr 1200 HL  Kehinde Totzke 05/12/2017,4:11 AM

## 2017-05-12 NOTE — Progress Notes (Addendum)
Blood glucose 47, initiated hypoglycemic protocol, MD notified.  Blood glucose on recheck 108

## 2017-05-12 NOTE — Progress Notes (Signed)
  Echocardiogram 2D Echocardiogram has been performed.  Denzil Bristol T Payeton Germani 05/12/2017, 11:02 AM

## 2017-05-12 NOTE — Progress Notes (Signed)
ANTICOAGULATION CONSULT NOTE - Initial Consult  Pharmacy Consult for Heparin  Indication: chest pain/ACS  Allergies  Allergen Reactions  . Crestor [Rosuvastatin] Other (See Comments)    unknown  . Lipitor [Atorvastatin] Other (See Comments)    Very bad pain in hips and joints    Patient Measurements: Height: 5\' 2"  (157.5 cm) Weight: 142 lb 6.7 oz (64.6 kg) IBW/kg (Calculated) : 50.1  Vital Signs: Temp: 98.5 F (36.9 C) (01/05 1119) Temp Source: Oral (01/05 1119) BP: 100/56 (01/05 1119) Pulse Rate: 86 (01/05 1119)  Labs: Recent Labs    05/10/17 2005  05/11/17 0242 05/11/17 0423  05/11/17 0936 05/11/17 1535 05/11/17 1842 05/12/17 0235 05/12/17 1140  HGB 11.4*  --   --   --   --   --   --   --  12.1  --   HCT 37.6  --   --   --   --   --   --   --  40.1  --   PLT 445*  --   --   --   --   --   --   --  401*  --   HEPARINUNFRC  --   --   --   --    < >  --   --  0.17* 0.26* 0.39  CREATININE 0.99  --   --   --   --   --  0.99  --  0.99  --   TROPONINI  --    < > 9.85* 9.41*  --  7.34*  --   --   --   --    < > = values in this interval not displayed.   Estimated Creatinine Clearance: 44.7 mL/min (by C-G formula based on SCr of 0.99 mg/dL).  Medical History: Past Medical History:  Diagnosis Date  . Cholelithiases   . Coronary artery disease 02/2007   CABG  . Diabetes (HCC)   . Dyslipidemia   . HTN (hypertension)   . Hypothyroid   . PAF (paroxysmal atrial fibrillation) (HCC)    post op  . S/P CABG x 4 2008   Assessment: 72 y/o F here with declining health since DC to rehab facility. Having weakness, fever, chills, shortness of breath. Pt found to have elevated troponin, no trending down, on heparin,  Heparin level therapeutic: 0.39, CBC stable, no overt bleeding documented  Goal of Therapy:  Heparin level 0.3-0.7 units/ml Monitor platelets by anticoagulation protocol: Yes   Plan:  Continue heparin gtt at 1250 units/hr  Daily heparin level/CBC Monitor  for s/sx of bleeding  Gracyn Santillanes L Bonita Brindisi 05/12/2017,1:26 PM

## 2017-05-12 NOTE — Progress Notes (Addendum)
PROGRESS NOTE    [name]  WUJ:[id number] DOB: 1946/03/12 DOA: 05/10/2017 PCP: Dorisann FramesBalan, Bindubal, MD    Brief Narrative: [name] is an 72 y.o. female past medical history of systolic heart failure the previous 2D echo that showed an EF of 20% is post CABG, paroxysmal atrial fibrillation diabetes mellitus type 2 who presents with complaints of chest pain and shortness of breath.     Assessment & Plan:   Active Problems:   CAD- CABG X 4 10/08   Diabetes (HCC)   Hypothyroid   SOB (shortness of breath)   Acute respiratory failure with hypoxia (HCC)   Acute on chronic combined systolic and diastolic congestive heart failure (HCC)   Hyperkalemia  Acute respiratory failure with hypoxia (HCC)/NSTEMI with acute on chronic combined systolic and diastolic congestive heart failure (HCC): S/P thoracentesis. O.75 L fluid removed. Continue with IV lasix.  Improving.  Now on 3 L oxygen.   NSTEMI  Troponin peak to 9.  Cardiology following.  She will need cath when more stable.  Continue with heparin Gtt.   SIRS; sepsis, PNA.  Continue with Vancomycin and Zosyn.  Lactic acidosis.  Hold ACE , SBP soft.  UA negative.  Follow Blood cultures; no growth. Urine culture.  Strep pneumonia negative.  Chest x ray with pleural effusion and bibasilar atelectasis vs infiltrates.  Legionella antigen, sputum culture pending.  Repeat lactic acid.   Diabetes mellitus type 2 with a last A1c of 10.2: Continue sliding scale insulin.  Hypoglycemia. Treated with D 50%  Hb A1c 8.8 poor controlled.  Received 24 units of insulin yesterday. I will hold lantus today due to CBG in the 40 this am.  Will resume lantus tomorrow.   Hyperkalemia: Hold ACE.  Received kayexalate.  Resolved.   Dyslipidemia: Continue statins.  Hypothyroidism: Continue Synthroid.  TSH 3.6, normal.   Left-sided pleural effusion: Underwent thoracentesis 1-04. 750 cc L fluid removed.       DVT prophylaxis:  Heparin Code Status: full code.  Family Communication: care discussed with patient.  Disposition Plan: remain in the step down unit.   Consultants:   Cardiology   IR   Procedures:  Thoracentesis.     Antimicrobials:   Vancomycin 05-11-2017  Zosyn 05-11-2017   Subjective: She is alert. Feeling better, denies chest pain.  Dyspnea has improved.  Still on 3 L oxygen   Objective: Vitals:   05/11/17 2028 05/11/17 2359 05/12/17 0418 05/12/17 0643  BP: 105/62 104/68 (!) 91/50   Pulse: 86 85 63 (!) 142  Resp: (!) 29 16 16 19   Temp: 98 F (36.7 C) 98 F (36.7 C) 97.7 F (36.5 C)   TempSrc: Oral Oral Oral   SpO2: 94% 94% 94% 100%  Weight:    64.6 kg (142 lb 6.7 oz)  Height:        Intake/Output Summary (Last 24 hours) at 05/12/2017 0716 Last data filed at 05/12/2017 0558 Gross per 24 hour  Intake 572.96 ml  Output 500 ml  Net 72.96 ml   Filed Weights   05/10/17 1955 05/12/17 0643  Weight: 61.2 kg (135 lb) 64.6 kg (142 lb 6.7 oz)    Examination:  General exam: Appears calm and comfortable  Respiratory system: normal respiratory effort, bilateral crackles.  Cardiovascular system: S1 & S2 heard, RRR . Positive JVD Gastrointestinal system: Abdomen is nondistended, soft and nontender. No organomegaly or masses felt. Normal bowel sounds heard. Central nervous system: Alert and oriented. No focal neurological deficits. Extremities:  Symmetric 5 x 5 power. Skin: No rashes, lesions or ulcers    Data Reviewed: I have personally reviewed following labs and imaging studies  CBC: Recent Labs  Lab 05/10/17 2005 05/12/17 0235  WBC 11.7* 13.0*  HGB 11.4* 12.1  HCT 37.6 40.1  MCV 86.4 86.2  PLT 445* 401*   Basic Metabolic Panel: Recent Labs  Lab 05/10/17 2005 05/11/17 1535 05/12/17 0235  NA 132* 138 136  K 5.6* 4.1 3.6  CL 94* 97* 96*  CO2 24 31 30   GLUCOSE 494* 80 51*  BUN 14 13 13   CREATININE 0.99 0.99 0.99  CALCIUM 9.9 9.5 8.8*   GFR: Estimated  Creatinine Clearance: 44.7 mL/min (by C-G formula based on SCr of 0.99 mg/dL). Liver Function Tests: Recent Labs  Lab 05/10/17 2120  AST 40  ALT 15  ALKPHOS 133*  BILITOT 0.6  PROT 7.3  ALBUMIN 3.4*   No results for input(s): LIPASE, AMYLASE in the last 168 hours. No results for input(s): AMMONIA in the last 168 hours. Coagulation Profile: No results for input(s): INR, PROTIME in the last 168 hours. Cardiac Enzymes: Recent Labs  Lab 05/10/17 2120 05/11/17 0242 05/11/17 0423 05/11/17 0936  TROPONINI 8.59* 9.85* 9.41* 7.34*   BNP (last 3 results) No results for input(s): PROBNP in the last 8760 hours. HbA1C: Recent Labs    05/11/17 0422  HGBA1C 8.8*   CBG: Recent Labs  Lab 05/11/17 1856 05/11/17 2022 05/11/17 2358 05/12/17 0417 05/12/17 0436  GLUCAP 127* 126* 82 47* 108*   Lipid Profile: No results for input(s): CHOL, HDL, LDLCALC, TRIG, CHOLHDL, LDLDIRECT in the last 72 hours. Thyroid Function Tests: Recent Labs    05/11/17 0936  TSH 3.693   Anemia Panel: No results for input(s): VITAMINB12, FOLATE, FERRITIN, TIBC, IRON, RETICCTPCT in the last 72 hours. Sepsis Labs: Recent Labs  Lab 05/10/17 2032 05/10/17 2214 05/11/17 0242 05/11/17 0423  PROCALCITON  --   --   --  <0.10  LATICACIDVEN 4.17* 2.69* 2.5* 3.3*    Recent Results (from the past 240 hour(s))  Culture, blood (routine x 2)     Status: None (Preliminary result)   Collection Time: 05/10/17  9:03 PM  Result Value Ref Range Status   Specimen Description BLOOD RIGHT ANTECUBITAL  Final   Special Requests   Final    BOTTLES DRAWN AEROBIC AND ANAEROBIC Blood Culture adequate volume   Culture NO GROWTH < 24 HOURS  Final   Report Status PENDING  Incomplete  Culture, blood (routine x 2)     Status: None (Preliminary result)   Collection Time: 05/10/17  9:13 PM  Result Value Ref Range Status   Specimen Description BLOOD RIGHT WRIST  Final   Special Requests   Final    BOTTLES DRAWN AEROBIC AND  ANAEROBIC Blood Culture adequate volume   Culture NO GROWTH < 24 HOURS  Final   Report Status PENDING  Incomplete  Culture, respiratory (NON-Expectorated)     Status: None (Preliminary result)   Collection Time: 05/11/17  3:50 PM  Result Value Ref Range Status   Specimen Description SPUTUM  Final   Special Requests NONE  Final   Gram Stain   Final    MODERATE WBC PRESENT,BOTH PMN AND MONONUCLEAR FEW SQUAMOUS EPITHELIAL CELLS PRESENT FEW GRAM POSITIVE COCCI IN PAIRS IN CHAINS RARE BUDDING YEAST SEEN    Culture PENDING  Incomplete   Report Status PENDING  Incomplete         Radiology Studies: Dg  Chest 1 View  Result Date: 05/11/2017 CLINICAL DATA:  Status post thoracentesis. EXAM: CHEST 1 VIEW COMPARISON:  Chest radiographs and CTA 05/10/2017 FINDINGS: Sequelae of prior CABG are again identified. The cardiac silhouette remains enlarged. There are persistent bilateral pleural effusions, likely moderate in size. The left pleural effusion appears smaller following interval thoracentesis, with some improved aeration of the left lower lobe. Pulmonary vascular congestion is similar to the prior study. There is persistent bibasilar atelectasis. No pneumothorax is identified. IMPRESSION: 1. Decreased size of left pleural effusion following thoracentesis with improved left lower lobe aeration. No pneumothorax. 2. Persistent moderate right pleural effusion. 3. Cardiomegaly and pulmonary vascular congestion. Electronically Signed   By: Sebastian Ache M.D.   On: 05/11/2017 15:19   Dg Chest 2 View  Result Date: 05/10/2017 CLINICAL DATA:  72 y/o F; weakness, fevers, chills, shortness of breath, recent diagnosis of pneumonia. EXAM: CHEST  2 VIEW COMPARISON:  05/04/2017 chest radiograph FINDINGS: Increased small right and stable moderate left pleural effusions. Bibasilar opacities. Pulmonary vascular congestion. Cardiomegaly largely obscured by lung base opacities. Status post CABG with sternotomy wires  aligned. No pneumothorax. No acute osseous abnormality is evident. Calcific aortic atherosclerosis. IMPRESSION: Increased small right and stable moderate left pleural effusions. Bibasilar opacities may represent associated atelectasis or pneumonia. Pulmonary vascular congestion and cardiomegaly. Electronically Signed   By: Mitzi Hansen M.D.   On: 05/10/2017 20:26   Ct Angio Chest Pe W/cm &/or Wo Cm  Result Date: 05/10/2017 CLINICAL DATA:  Weakness, fevers, chills, shortness of breath. PE suspected, high pretest prob EXAM: CT ANGIOGRAPHY CHEST WITH CONTRAST TECHNIQUE: Multidetector CT imaging of the chest was performed using the standard protocol during bolus administration of intravenous contrast. Multiplanar CT image reconstructions and MIPs were obtained to evaluate the vascular anatomy. CONTRAST:  ISOVUE-370 IOPAMIDOL (ISOVUE-370) INJECTION 76% COMPARISON:  Chest radiograph earlier this day, additional priors. Chest CT 01/30/2017 FINDINGS: Cardiovascular: There are no filling defects within the pulmonary arteries to suggest pulmonary embolus. Right-sided aortic arch with aortic atherosclerosis. There are coronary artery calcifications. Multi chamber cardiomegaly. Contrast refluxes into the hepatic veins and IVC. No pericardial effusion. Mediastinum/Nodes: No definite enlarged mediastinal or hilar lymph nodes. Air-fluid level in the upper esophagus. Calcification in the right thyroid gland. Lungs/Pleura: Moderate to large right and moderate left pleural effusion with adjacent compressive atelectasis. Suspect mild pulmonary edema with septal thickening in the apices and ground-Costello opacities in the perihilar regions. Upper Abdomen: Embolization coils at the splenic hilum. No upper abdominal free fluid. Musculoskeletal: Multiple remote left-sided rib fractures with surrounding callus, incomplete healing of many of the fractures. Remote fracture of lower right tenth rib fracture with surrounding  callus. No acute osseous abnormality. Review of the MIP images confirms the above findings. IMPRESSION: 1. No pulmonary embolus. 2. CHF with moderate to large right and moderate left pleural effusion, cardiomegaly and pulmonary edema. Contrast refluxing into the hepatic veins and IVC consistent with elevated right heart pressure. 3. Right-sided aortic arch.  Aortic Atherosclerosis (ICD10-I70.0). Electronically Signed   By: Rubye Oaks M.D.   On: 05/10/2017 23:45   Ir Thoracentesis Asp Pleural Space W/img Guide  Result Date: 05/11/2017 INDICATION: Acute respiratory failure secondary to pleural effusions. NSTEMI with acute on chronic systolic and diastolic congestive heart failure. Request is made for left-sided thoracentesis. EXAM: ULTRASOUND GUIDED DIAGNOSTIC AND THERAPEUTIC THORACENTESIS MEDICATIONS: 1% lidocaine COMPLICATIONS: None immediate. PROCEDURE: An ultrasound guided thoracentesis was thoroughly discussed with the patient and questions answered. The benefits, risks, alternatives and  complications were also discussed. The patient understands and wishes to proceed with the procedure. Written consent was obtained. Ultrasound was performed to localize and mark an adequate pocket of fluid in the left chest. The area was then prepped and draped in the normal sterile fashion. 1% Lidocaine was used for local anesthesia. Under ultrasound guidance a Safe-T-Centesis catheter was introduced. Thoracentesis was performed. The catheter was removed and a dressing applied. FINDINGS: A total of approximately 0.75 L of serous fluid was removed. Samples were sent to the laboratory as requested by the clinical team. IMPRESSION: Successful ultrasound guided left thoracentesis yielding 0.75 L of pleural fluid. The procedure was terminated slightly early secondary to significant coughing. Read by: Barnetta Chapel, PA-C Electronically Signed   By: Richarda Overlie M.D.   On: 05/11/2017 15:07        Scheduled Meds: .  carvedilol  3.125 mg Oral BID WC  . ezetimibe-simvastatin  1 tablet Oral Daily  . furosemide  40 mg Intravenous BID  . insulin aspart  0-9 Units Subcutaneous Q4H  . insulin glargine  8 Units Subcutaneous QHS  . levothyroxine  50 mcg Oral QAC breakfast  . sodium chloride flush  3 mL Intravenous Q12H   Continuous Infusions: . sodium chloride    . heparin 1,250 Units/hr (05/12/17 0529)  . nitroGLYCERIN Stopped (05/11/17 1817)  . piperacillin-tazobactam (ZOSYN)  IV 3.375 g (05/12/17 0535)  . vancomycin Stopped (05/12/17 0654)     LOS: 1 day    Time spent: 35 minutes.     Alba Cory, MD [facility] Pager [phone]  If 7PM-7AM, please contact night-coverage [email or web address] Password [email or web address] 05/12/2017, 7:16 AM
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
## 2017-05-13 LAB — BASIC METABOLIC PANEL
ANION GAP: 10 (ref 5–15)
BUN: 16 mg/dL (ref 6–20)
CALCIUM: 8.3 mg/dL — AB (ref 8.9–10.3)
CO2: 26 mmol/L (ref 22–32)
Chloride: 98 mmol/L — ABNORMAL LOW (ref 101–111)
Creatinine, Ser: 1.08 mg/dL — ABNORMAL HIGH (ref 0.44–1.00)
GFR calc non Af Amer: 50 mL/min — ABNORMAL LOW (ref >60)
GFR, EST AFRICAN AMERICAN: 58 mL/min — AB (ref >60)
Glucose, Bld: 86 mg/dL (ref 65–99)
POTASSIUM: 3.1 mmol/L — AB (ref 3.5–5.1)
SODIUM: 134 mmol/L — AB (ref 135–145)

## 2017-05-13 LAB — HEPARIN LEVEL (UNFRACTIONATED): HEPARIN UNFRACTIONATED: 0.41 [IU]/mL (ref 0.30–0.70)

## 2017-05-13 LAB — CULTURE, RESPIRATORY

## 2017-05-13 LAB — MRSA PCR SCREENING: MRSA by PCR: NEGATIVE

## 2017-05-13 LAB — CBC
HCT: 36.7 % (ref 36.0–46.0)
Hemoglobin: 11.4 g/dL — ABNORMAL LOW (ref 12.0–15.0)
MCH: 27 pg (ref 26.0–34.0)
MCHC: 31.1 g/dL (ref 30.0–36.0)
MCV: 86.8 fL (ref 78.0–100.0)
PLATELETS: 321 10*3/uL (ref 150–400)
RBC: 4.23 MIL/uL (ref 3.87–5.11)
RDW: 16.9 % — AB (ref 11.5–15.5)
WBC: 11.7 10*3/uL — AB (ref 4.0–10.5)

## 2017-05-13 LAB — GLUCOSE, CAPILLARY
GLUCOSE-CAPILLARY: 107 mg/dL — AB (ref 65–99)
GLUCOSE-CAPILLARY: 188 mg/dL — AB (ref 65–99)
GLUCOSE-CAPILLARY: 237 mg/dL — AB (ref 65–99)
GLUCOSE-CAPILLARY: 99 mg/dL (ref 65–99)
Glucose-Capillary: 109 mg/dL — ABNORMAL HIGH (ref 65–99)
Glucose-Capillary: 244 mg/dL — ABNORMAL HIGH (ref 65–99)
Glucose-Capillary: 228 mg/dL — ABNORMAL HIGH (ref 65–99)

## 2017-05-13 LAB — CULTURE, RESPIRATORY W GRAM STAIN

## 2017-05-13 LAB — LACTIC ACID, PLASMA: LACTIC ACID, VENOUS: 2.5 mmol/L — AB (ref 0.5–1.9)

## 2017-05-13 MED ORDER — LOSARTAN POTASSIUM 25 MG PO TABS
12.5000 mg | ORAL_TABLET | Freq: Every day | ORAL | Status: DC
  Administered 2017-05-13 – 2017-05-20 (×6): 12.5 mg via ORAL
  Filled 2017-05-13 (×4): qty 1
  Filled 2017-05-13: qty 0.5
  Filled 2017-05-13 (×2): qty 1

## 2017-05-13 MED ORDER — POTASSIUM CHLORIDE CRYS ER 20 MEQ PO TBCR
40.0000 meq | EXTENDED_RELEASE_TABLET | Freq: Once | ORAL | Status: AC
  Administered 2017-05-13: 40 meq via ORAL
  Filled 2017-05-13: qty 2

## 2017-05-13 MED ORDER — ASPIRIN EC 81 MG PO TBEC
81.0000 mg | DELAYED_RELEASE_TABLET | Freq: Every day | ORAL | Status: DC
  Administered 2017-05-13 – 2017-05-15 (×3): 81 mg via ORAL
  Filled 2017-05-13 (×3): qty 1
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
## 2017-05-13 NOTE — Progress Notes (Signed)
ANTICOAGULATION CONSULT NOTE - Follow up Consult  Pharmacy Consult for Heparin  Indication: chest pain/ACS  Allergies  Allergen Reactions  . Crestor [Rosuvastatin] Other (See Comments)    unknown  . Lipitor [Atorvastatin] Other (See Comments)    Very bad pain in hips and joints    Patient Measurements: Height: 5\' 2"  (157.5 cm) Weight: 147 lb 14.9 oz (67.1 kg) IBW/kg (Calculated) : 50.1  Vital Signs: Temp: 99.7 F (37.6 C) (01/06 0755) Temp Source: Oral (01/06 0755) BP: 123/75 (01/06 0753) Pulse Rate: 87 (01/06 0753)  Labs: Recent Labs    05/10/17 2005  05/11/17 0242 05/11/17 0423  05/11/17 0936 05/11/17 1535  05/12/17 0235 05/12/17 1140 05/13/17 0257  HGB 11.4*  --   --   --   --   --   --   --  12.1  --  11.4*  HCT 37.6  --   --   --   --   --   --   --  40.1  --  36.7  PLT 445*  --   --   --   --   --   --   --  401*  --  321  HEPARINUNFRC  --   --   --   --    < >  --   --    < > 0.26* 0.39 0.41  CREATININE 0.99  --   --   --   --   --  0.99  --  0.99  --  1.08*  TROPONINI  --    < > 9.85* 9.41*  --  7.34*  --   --   --   --   --    < > = values in this interval not displayed.   Estimated Creatinine Clearance: 41.7 mL/min (A) (by C-G formula based on SCr of 1.08 mg/dL (H)).  Medical History: Past Medical History:  Diagnosis Date  . Cholelithiases   . Coronary artery disease 02/2007   CABG  . Diabetes (HCC)   . Dyslipidemia   . HTN (hypertension)   . Hypothyroid   . PAF (paroxysmal atrial fibrillation) (HCC)    post op  . S/P CABG x 4 2008   Assessment: 72 y/o F here with declining health since DC to rehab facility. Having weakness, fever, chills, shortness of breath. Pt found to have elevated troponin, no trending down, on heparin,  Heparin level therapeutic: 0.41, CBC stable, no overt bleeding documented  Goal of Therapy:  Heparin level 0.3-0.7 units/ml Monitor platelets by anticoagulation protocol: Yes   Plan:  Continue heparin gtt at 1250  units/hr  Daily heparin level/CBC Monitor for s/sx of bleeding  Thank you for allowing us to participate in this patients care.  Signe Coltonya C Kalima Saylor, PharmD Clinical phone for 05/13/2017: x [phone] 05/13/2017 11:26 AM

## 2017-05-13 NOTE — Progress Notes (Signed)
PROGRESS NOTE    Tara Barterolly K Bishop  NFA:[id number]:[id number] DOB: [birth year] DOA: 05/10/2017 PCP: Dorisann FramesBalan, Bindubal, MD    Brief Narrative: [name] is an 72 y.o. female past medical history of systolic heart failure the previous 2D echo that showed an EF of 20% is post CABG, paroxysmal atrial fibrillation diabetes mellitus type 2 who presents with complaints of chest pain and shortness of breath.     Assessment & Plan:   Active Problems:   CAD- CABG X 4 10/08   Diabetes (HCC)   Hypothyroid   SOB (shortness of breath)   Acute respiratory failure with hypoxia (HCC)   Acute on chronic combined systolic and diastolic congestive heart failure (HCC)   Hyperkalemia  Acute respiratory failure with hypoxia (HCC)/NSTEMI with acute on chronic combined systolic and diastolic congestive heart failure (HCC): S/P thoracentesis. O.75 L fluid removed. Continue with IV lasix.  Improving. But not at baseline. Oxygen sat drop off oxygen to 80.  3 L oxygen.  Weight 147--  NSTEMI  Troponin peak to 9.  Cardiology following.  She will need cath next this coming week.  Continue with heparin Gtt.   SIRS; sepsis, PNA.  Continue with Vancomycin and Zosyn.  Lactic acidosis.  Hold ACE , SBP soft.  UA negative.  Follow Blood cultures; no growth. Urine culture.  Strep pneumonia negative.  Chest x ray with pleural effusion and bibasilar atelectasis vs infiltrates.  Legionella antigen negative, sputum culture still  pending.  Repeat lactic acid fluctuates.  Check MRSA PCR. If negative will stop vanc Urine culture insignificant growth.   Diabetes mellitus type 2 with a last A1c of 10.2: Continue sliding scale insulin.  Hypoglycemia. Treated with D 50%  Hb A1c 8.8 poor controlled.  Received 24 units of insulin yesterday. I will hold lantus today due to CBG in the 40 this am.  Will resume lantus today   Hyperkalemia: Hold ACE.  Received kayexalate.  Resolved. Now hypokalemia. Replete    Dyslipidemia: Continue statins.  Hypothyroidism: Continue Synthroid.  TSH 3.6, normal.   Left-sided pleural effusion: Underwent thoracentesis 1-04. 750 cc L fluid removed.       DVT prophylaxis: Heparin Code Status: full code.  Family Communication: care discussed with patient.  Disposition Plan: remain in the step down unit.   Consultants:   Cardiology   IR   Procedures:  Thoracentesis.     Antimicrobials:   Vancomycin 05-11-2017  Zosyn 05-11-2017   Subjective: She is still having dyspnea, but getting better. Not at baseline.  Oxygen sat drop to 80 off oxygen   Objective: Vitals:   05/13/17 0230 05/13/17 0422 05/13/17 0753 05/13/17 0755  BP:  (!) 96/58 123/75   Pulse:  80 87   Resp:  17    Temp:  98.5 F (36.9 C)  99.7 F (37.6 C)  TempSrc:  Oral  Oral  SpO2:  98%  98%  Weight: 67.1 kg (147 lb 14.9 oz)     Height:        Intake/Output Summary (Last 24 hours) at 05/13/2017 1105 Last data filed at 05/13/2017 0936 Gross per 24 hour  Intake 603 ml  Output 550 ml  Net 53 ml   Filed Weights   05/10/17 1955 05/12/17 0643 05/13/17 0230  Weight: 61.2 kg (135 lb) 64.6 kg (142 lb 6.7 oz) 67.1 kg (147 lb 14.9 oz)    Examination:  General exam: NAD Respiratory system: Normal respiratory effort, bilateral crackles.  Cardiovascular system: S 1, S 2  RRR, positive JVD.  Gastrointestinal system: bs present, soft, nt Central nervous system: non focal.  Extremities: symmetric power.  Skin: no rash     Data Reviewed: I have personally reviewed following labs and imaging studies  CBC: Recent Labs  Lab 05/10/17 2005 05/12/17 0235 05/13/17 0257  WBC 11.7* 13.0* 11.7*  HGB 11.4* 12.1 11.4*  HCT 37.6 40.1 36.7  MCV 86.4 86.2 86.8  PLT 445* 401* 321   Basic Metabolic Panel: Recent Labs  Lab 05/10/17 2005 05/11/17 1535 05/12/17 0235 05/13/17 0257  NA 132* 138 136 134*  K 5.6* 4.1 3.6 3.1*  CL 94* 97* 96* 98*  CO2 24 31 30 26   GLUCOSE  494* 80 51* 86  BUN 14 13 13 16   CREATININE 0.99 0.99 0.99 1.08*  CALCIUM 9.9 9.5 8.8* 8.3*   GFR: Estimated Creatinine Clearance: 41.7 mL/min (A) (by C-G formula based on SCr of 1.08 mg/dL (H)). Liver Function Tests: Recent Labs  Lab 05/10/17 2120  AST 40  ALT 15  ALKPHOS 133*  BILITOT 0.6  PROT 7.3  ALBUMIN 3.4*   No results for input(s): LIPASE, AMYLASE in the last 168 hours. No results for input(s): AMMONIA in the last 168 hours. Coagulation Profile: No results for input(s): INR, PROTIME in the last 168 hours. Cardiac Enzymes: Recent Labs  Lab 05/10/17 2120 05/11/17 0242 05/11/17 0423 05/11/17 0936  TROPONINI 8.59* 9.85* 9.41* 7.34*   BNP (last 3 results) No results for input(s): PROBNP in the last 8760 hours. HbA1C: Recent Labs    05/11/17 0422  HGBA1C 8.8*   CBG: Recent Labs  Lab 05/12/17 1621 05/12/17 2038 05/13/17 0009 05/13/17 0420 05/13/17 0735  GLUCAP 136* 119* 107* 99 109*   Lipid Profile: No results for input(s): CHOL, HDL, LDLCALC, TRIG, CHOLHDL, LDLDIRECT in the last 72 hours. Thyroid Function Tests: Recent Labs    05/11/17 0936  TSH 3.693   Anemia Panel: No results for input(s): VITAMINB12, FOLATE, FERRITIN, TIBC, IRON, RETICCTPCT in the last 72 hours. Sepsis Labs: Recent Labs  Lab 05/11/17 0242 05/11/17 0423 05/12/17 0709 05/12/17 1140  PROCALCITON  --  <0.10  --   --   LATICACIDVEN 2.5* 3.3* 2.5* 3.2*    Recent Results (from the past 240 hour(s))  Culture, blood (routine x 2)     Status: None (Preliminary result)   Collection Time: 05/10/17  9:03 PM  Result Value Ref Range Status   Specimen Description BLOOD RIGHT ANTECUBITAL  Final   Special Requests   Final    BOTTLES DRAWN AEROBIC AND ANAEROBIC Blood Culture adequate volume   Culture NO GROWTH 2 DAYS  Final   Report Status PENDING  Incomplete  Culture, blood (routine x 2)     Status: None (Preliminary result)   Collection Time: 05/10/17  9:13 PM  Result Value Ref  Range Status   Specimen Description BLOOD RIGHT WRIST  Final   Special Requests   Final    BOTTLES DRAWN AEROBIC AND ANAEROBIC Blood Culture adequate volume   Culture NO GROWTH 2 DAYS  Final   Report Status PENDING  Incomplete  Urine culture     Status: Abnormal   Collection Time: 05/11/17  2:42 AM  Result Value Ref Range Status   Specimen Description URINE, CLEAN CATCH  Final   Special Requests NONE  Final   Culture <10,000 COLONIES/mL (A)  Final   Report Status 05/12/2017 FINAL  Final  Culture, respiratory (NON-Expectorated)     Status: None (Preliminary result)  Collection Time: 05/11/17  3:50 PM  Result Value Ref Range Status   Specimen Description SPUTUM  Final   Special Requests NONE  Final   Gram Stain   Final    MODERATE WBC PRESENT,BOTH PMN AND MONONUCLEAR FEW SQUAMOUS EPITHELIAL CELLS PRESENT FEW GRAM POSITIVE COCCI IN PAIRS IN CHAINS RARE BUDDING YEAST SEEN    Culture CULTURE REINCUBATED FOR BETTER GROWTH  Final   Report Status PENDING  Incomplete         Radiology Studies: Dg Chest 1 View  Result Date: 05/11/2017 CLINICAL DATA:  Status post thoracentesis. EXAM: CHEST 1 VIEW COMPARISON:  Chest radiographs and CTA 05/10/2017 FINDINGS: Sequelae of prior CABG are again identified. The cardiac silhouette remains enlarged. There are persistent bilateral pleural effusions, likely moderate in size. The left pleural effusion appears smaller following interval thoracentesis, with some improved aeration of the left lower lobe. Pulmonary vascular congestion is similar to the prior study. There is persistent bibasilar atelectasis. No pneumothorax is identified. IMPRESSION: 1. Decreased size of left pleural effusion following thoracentesis with improved left lower lobe aeration. No pneumothorax. 2. Persistent moderate right pleural effusion. 3. Cardiomegaly and pulmonary vascular congestion. Electronically Signed   By: Sebastian Ache M.D.   On: 05/11/2017 15:19   Ir Thoracentesis Asp  Pleural Space W/img Guide  Result Date: 05/11/2017 INDICATION: Acute respiratory failure secondary to pleural effusions. NSTEMI with acute on chronic systolic and diastolic congestive heart failure. Request is made for left-sided thoracentesis. EXAM: ULTRASOUND GUIDED DIAGNOSTIC AND THERAPEUTIC THORACENTESIS MEDICATIONS: 1% lidocaine COMPLICATIONS: None immediate. PROCEDURE: An ultrasound guided thoracentesis was thoroughly discussed with the patient and questions answered. The benefits, risks, alternatives and complications were also discussed. The patient understands and wishes to proceed with the procedure. Written consent was obtained. Ultrasound was performed to localize and mark an adequate pocket of fluid in the left chest. The area was then prepped and draped in the normal sterile fashion. 1% Lidocaine was used for local anesthesia. Under ultrasound guidance a Safe-T-Centesis catheter was introduced. Thoracentesis was performed. The catheter was removed and a dressing applied. FINDINGS: A total of approximately 0.75 L of serous fluid was removed. Samples were sent to the laboratory as requested by the clinical team. IMPRESSION: Successful ultrasound guided left thoracentesis yielding 0.75 L of pleural fluid. The procedure was terminated slightly early secondary to significant coughing. Read by: Barnetta Chapel, PA-C Electronically Signed   By: Richarda Overlie M.D.   On: 05/11/2017 15:07        Scheduled Meds: . aspirin EC  81 mg Oral Daily  . carvedilol  3.125 mg Oral BID WC  . ezetimibe-simvastatin  1 tablet Oral Daily  . furosemide  60 mg Intravenous BID  . insulin aspart  0-9 Units Subcutaneous Q4H  . insulin glargine  8 Units Subcutaneous QHS  . levothyroxine  50 mcg Oral QAC breakfast  . losartan  12.5 mg Oral Daily  . sodium chloride flush  3 mL Intravenous Q12H   Continuous Infusions: . sodium chloride 250 mL (05/13/17 0709)  . heparin 1,250 Units/hr (05/12/17 2149)  . nitroGLYCERIN  Stopped (05/11/17 1817)  . piperacillin-tazobactam (ZOSYN)  IV 3.375 g (05/13/17 0700)  . vancomycin Stopped (05/13/17 0901)     LOS: 2 days    Time spent: 35 minutes.     Alba Cory, MD [facility] Pager [phone]  If 7PM-7AM, please contact night-coverage [email or web address] Password [email or web address] 05/13/2017, 11:05 AM

## 2017-05-14 ENCOUNTER — Other Ambulatory Visit: Payer: Self-pay

## 2017-05-14 LAB — BASIC METABOLIC PANEL
ANION GAP: 10 (ref 5–15)
BUN: 17 mg/dL (ref 6–20)
CHLORIDE: 99 mmol/L — AB (ref 101–111)
CO2: 28 mmol/L (ref 22–32)
Calcium: 8.4 mg/dL — ABNORMAL LOW (ref 8.9–10.3)
Creatinine, Ser: 1.07 mg/dL — ABNORMAL HIGH (ref 0.44–1.00)
GFR calc non Af Amer: 50 mL/min — ABNORMAL LOW (ref >60)
GFR, EST AFRICAN AMERICAN: 58 mL/min — AB (ref >60)
Glucose, Bld: 155 mg/dL — ABNORMAL HIGH (ref 65–99)
POTASSIUM: 3.3 mmol/L — AB (ref 3.5–5.1)
Sodium: 137 mmol/L (ref 135–145)

## 2017-05-14 LAB — CBC
HEMATOCRIT: 35.9 % — AB (ref 36.0–46.0)
HEMOGLOBIN: 10.7 g/dL — AB (ref 12.0–15.0)
MCH: 25.7 pg — ABNORMAL LOW (ref 26.0–34.0)
MCHC: 29.8 g/dL — ABNORMAL LOW (ref 30.0–36.0)
MCV: 86.1 fL (ref 78.0–100.0)
Platelets: 343 10*3/uL (ref 150–400)
RBC: 4.17 MIL/uL (ref 3.87–5.11)
RDW: 16.5 % — ABNORMAL HIGH (ref 11.5–15.5)
WBC: 11.8 10*3/uL — AB (ref 4.0–10.5)

## 2017-05-14 LAB — GLUCOSE, CAPILLARY
GLUCOSE-CAPILLARY: 136 mg/dL — AB (ref 65–99)
GLUCOSE-CAPILLARY: 127 mg/dL — AB (ref 65–99)
Glucose-Capillary: 223 mg/dL — ABNORMAL HIGH (ref 65–99)
Glucose-Capillary: 228 mg/dL — ABNORMAL HIGH (ref 65–99)
Glucose-Capillary: 261 mg/dL — ABNORMAL HIGH (ref 65–99)

## 2017-05-14 LAB — PROTIME-INR
INR: 1.2
Prothrombin Time: 15.1 seconds (ref 11.4–15.2)

## 2017-05-14 LAB — HEPARIN LEVEL (UNFRACTIONATED): Heparin Unfractionated: 0.63 IU/mL (ref 0.30–0.70)

## 2017-05-14 LAB — MAGNESIUM: Magnesium: 1.5 mg/dL — ABNORMAL LOW (ref 1.7–2.4)

## 2017-05-14 MED ORDER — SODIUM CHLORIDE 0.9 % IV SOLN: 250.0000 mL | INTRAVENOUS | Status: DC | PRN

## 2017-05-14 MED ORDER — POTASSIUM CHLORIDE CRYS ER 20 MEQ PO TBCR
40.0000 meq | EXTENDED_RELEASE_TABLET | Freq: Two times a day (BID) | ORAL | Status: AC
  Administered 2017-05-14 (×2): 40 meq via ORAL
  Filled 2017-05-14 (×2): qty 2

## 2017-05-14 MED ORDER — CLOPIDOGREL BISULFATE 75 MG PO TABS
300.0000 mg | ORAL_TABLET | Freq: Once | ORAL | Status: AC
  Administered 2017-05-14: 300 mg via ORAL
  Filled 2017-05-14: qty 4

## 2017-05-14 MED ORDER — MAGNESIUM SULFATE 2 GM/50ML IV SOLN
2.0000 g | Freq: Once | INTRAVENOUS | Status: AC
  Administered 2017-05-14: 2 g via INTRAVENOUS
  Filled 2017-05-14: qty 50

## 2017-05-14 MED ORDER — SODIUM CHLORIDE 0.9% FLUSH
3.0000 mL | Freq: Two times a day (BID) | INTRAVENOUS | Status: DC
  Administered 2017-05-14: 3 mL via INTRAVENOUS

## 2017-05-14 MED ORDER — SODIUM CHLORIDE 0.9% FLUSH: 3.0000 mL | INTRAVENOUS | Status: DC | PRN

## 2017-05-14 MED ORDER — CLOPIDOGREL BISULFATE 75 MG PO TABS: 75.0000 mg | ORAL_TABLET | Freq: Every day | ORAL | Status: DC
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
## 2017-05-14 NOTE — Progress Notes (Signed)
ANTICOAGULATION CONSULT NOTE - Follow up Consult  Pharmacy Consult for Heparin  Indication: chest pain/ACS  Allergies  Allergen Reactions  . Crestor [Rosuvastatin] Other (See Comments)    unknown  . Lipitor [Atorvastatin] Other (See Comments)    Very bad pain in hips and joints    Patient Measurements: Height: 5\' 2"  (157.5 cm) Weight: 143 lb 4.8 oz (65 kg) IBW/kg (Calculated) : 50.1  Vital Signs: Temp: 98.4 F (36.9 C) (01/07 0737) Temp Source: Oral (01/07 0737) BP: 103/59 (01/07 0737) Pulse Rate: 79 (01/07 0737)  Labs: Recent Labs    05/11/17 0936  05/12/17 0235 05/12/17 1140 05/13/17 0257 05/14/17 0312  HGB  --    < > 12.1  --  11.4* 10.7*  HCT  --   --  40.1  --  36.7 35.9*  PLT  --   --  401*  --  321 343  HEPARINUNFRC  --    < > 0.26* 0.39 0.41 0.63  CREATININE  --    < > 0.99  --  1.08* 1.07*  TROPONINI 7.34*  --   --   --   --   --    < > = values in this interval not displayed.   Estimated Creatinine Clearance: 41.5 mL/min (A) (by C-G formula based on SCr of 1.07 mg/dL (H)).  Medical History: Past Medical History:  Diagnosis Date  . Cholelithiases   . Coronary artery disease 02/2007   CABG  . Diabetes (HCC)   . Dyslipidemia   . HTN (hypertension)   . Hypothyroid   . PAF (paroxysmal atrial fibrillation) (HCC)    post op  . S/P CABG x 4 2008   Assessment: 72 y/o F here with declining health since D/C to rehab facility. Having weakness, fever, chills, shortness of breath. Pt found to have elevated troponin, no trending down, on heparin.  Heparin level is therapeutic at 0.63, on 1250 units/hr. Hgb trending down slightly to 10.7, platelets remain stable. No infusion issues per nursing. No signs/symptoms of bleeding.    Goals of Therapy:  Heparin level 0.3-0.7 units/ml Monitor platelets by anticoagulation protocol: Yes   Plan:  Continue heparin gtt at 1250 units/hr  Daily heparin level/CBC Monitor for s/sx of bleeding F/u if plan for  cath  Thank you for allowing us to participate in this patients care.  Girard CooterKimberly Perkins, PharmD Clinical Pharmacist  Pager: [phone] Clinical Phone for 05/14/2017 until 3:30pm: [phone] If after 3:30pm, please call main pharmacy at [phone] 05/14/2017 8:41 AM
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
## 2017-05-14 NOTE — Progress Notes (Signed)
PROGRESS NOTE    [name]  ZOX:[id number] DOB: January 30, 1946 DOA: 05/10/2017 PCP: Dorisann Frames, MD    Brief Narrative: [name] is an 72 y.o. female past medical history of systolic heart failure the previous 2D echo that showed an EF of 20% is post CABG, paroxysmal atrial fibrillation diabetes mellitus type 2 who presents with complaints of chest pain and shortness of breath.     Assessment & Plan:   Active Problems:   CAD- CABG X 4 10/08   Diabetes (HCC)   Hypothyroid   SOB (shortness of breath)   Acute respiratory failure with hypoxia (HCC)   Acute on chronic combined systolic and diastolic congestive heart failure (HCC)   Hyperkalemia  Acute respiratory failure with hypoxia (HCC)/NSTEMI with acute on chronic combined systolic and diastolic congestive heart failure (HCC): S/P thoracentesis. O.75 L fluid removed. Continue with IV lasix. 60 mg IV BID>  3 L oxygen.  Weight 147--143  NSTEMI  Troponin peak to 9.  Cardiology following.  She will need cath next this coming week.  Continue with heparin Gtt.   SIRS; sepsis, PNA.  Continue with Zosyn day 4 Lactic acidosis. Hold ACE , SBP soft. UA negative.  Follow Blood cultures; no growth. Urine culture.  Strep pneumonia negative.  Chest x ray with pleural effusion and bibasilar atelectasis vs infiltrates.  Legionella antigen negative, sputum culture grew candida glabrata. Discussed with ID this is not a pulmonary pathogen , no need to terat it.  Repeat lactic acid fluctuates.  MRSA PCR negative, vancomycin stopped.  Urine culture insignificant growth.   Diabetes mellitus type 2 with a last A1c of 10.2: Continue sliding scale insulin.  Hypoglycemia. Treated with D 50%  Hb A1c 8.8 poor controlled.  Continue with lantus.   Hyperkalemia: Hold ACE.  Received kayexalate.  Resolved. Now hypokalemia. Replete   Dyslipidemia: Continue statins.  Hypothyroidism: Continue Synthroid.  TSH 3.6, normal.    Left-sided pleural effusion: Underwent thoracentesis 1-04. 750 cc L fluid removed.  Iv lasix.   Hypomagnesemia; IV mag,     DVT prophylaxis: Heparin Code Status: full code.  Family Communication: care discussed with patient.  Disposition Plan: remain in the step down unit.   Consultants:   Cardiology   IR   Procedures:  Thoracentesis.     Antimicrobials:   Vancomycin 05-11-2017  Zosyn 05-11-2017   Subjective: She report improvement of dyspnea.  Able to lay down flat.    Objective: Vitals:   05/13/17 2004 05/13/17 2345 05/14/17 0448 05/14/17 0737  BP:  102/67 105/63 (!) 103/59  Pulse:  79 79 79  Resp:  18 18 19   Temp: 99.3 F (37.4 C) 98.5 F (36.9 C) 98.7 F (37.1 C) 98.4 F (36.9 C)  TempSrc: Oral Oral Oral Oral  SpO2:  99% 97% 99%  Weight:   65 kg (143 lb 4.8 oz)   Height:        Intake/Output Summary (Last 24 hours) at 05/14/2017 1019 Last data filed at 05/14/2017 0500 Gross per 24 hour  Intake 800 ml  Output 1675 ml  Net -875 ml   Filed Weights   05/12/17 0643 05/13/17 0230 05/14/17 0448  Weight: 64.6 kg (142 lb 6.7 oz) 67.1 kg (147 lb 14.9 oz) 65 kg (143 lb 4.8 oz)    Examination:  General exam: NAD Respiratory system ; Bilateral crackles bases.  Cardiovascular system: S 1, S 2 RRR, positive JVD.  Gastrointestinal system: BS present, soft, nt Central nervous system: non  focal.  Extremities: symmetric power.  Skin: no rash     Data Reviewed: I have personally reviewed following labs and imaging studies  CBC: Recent Labs  Lab 05/10/17 2005 05/12/17 0235 05/13/17 0257 05/14/17 0312  WBC 11.7* 13.0* 11.7* 11.8*  HGB 11.4* 12.1 11.4* 10.7*  HCT 37.6 40.1 36.7 35.9*  MCV 86.4 86.2 86.8 86.1  PLT 445* 401* 321 343   Basic Metabolic Panel: Recent Labs  Lab 05/10/17 2005 05/11/17 1535 05/12/17 0235 05/13/17 0257 05/14/17 0312  NA 132* 138 136 134* 137  K 5.6* 4.1 3.6 3.1* 3.3*  CL 94* 97* 96* 98* 99*  CO2 24 31 30 26  28   GLUCOSE 494* 80 51* 86 155*  BUN 14 13 13 16 17   CREATININE 0.99 0.99 0.99 1.08* 1.07*  CALCIUM 9.9 9.5 8.8* 8.3* 8.4*  MG  --   --   --   --  1.5*   GFR: Estimated Creatinine Clearance: 41.5 mL/min (A) (by C-G formula based on SCr of 1.07 mg/dL (H)). Liver Function Tests: Recent Labs  Lab 05/10/17 2120  AST 40  ALT 15  ALKPHOS 133*  BILITOT 0.6  PROT 7.3  ALBUMIN 3.4*   No results for input(s): LIPASE, AMYLASE in the last 168 hours. No results for input(s): AMMONIA in the last 168 hours. Coagulation Profile: No results for input(s): INR, PROTIME in the last 168 hours. Cardiac Enzymes: Recent Labs  Lab 05/10/17 2120 05/11/17 0242 05/11/17 0423 05/11/17 0936  TROPONINI 8.59* 9.85* 9.41* 7.34*   BNP (last 3 results) No results for input(s): PROBNP in the last 8760 hours. HbA1C: No results for input(s): HGBA1C in the last 72 hours. CBG: Recent Labs  Lab 05/13/17 1609 05/13/17 2010 05/13/17 2341 05/14/17 0443 05/14/17 0735  GLUCAP 244* 228* 237* 136* 127*   Lipid Profile: No results for input(s): CHOL, HDL, LDLCALC, TRIG, CHOLHDL, LDLDIRECT in the last 72 hours. Thyroid Function Tests: No results for input(s): TSH, T4TOTAL, FREET4, T3FREE, THYROIDAB in the last 72 hours. Anemia Panel: No results for input(s): VITAMINB12, FOLATE, FERRITIN, TIBC, IRON, RETICCTPCT in the last 72 hours. Sepsis Labs: Recent Labs  Lab 05/11/17 0242 05/11/17 0423 05/12/17 0709 05/12/17 1140  PROCALCITON  --  <0.10  --   --   LATICACIDVEN 2.5* 3.3* 2.5* 3.2*    Recent Results (from the past 240 hour(s))  Culture, blood (routine x 2)     Status: None (Preliminary result)   Collection Time: 05/10/17  9:03 PM  Result Value Ref Range Status   Specimen Description BLOOD RIGHT ANTECUBITAL  Final   Special Requests   Final    BOTTLES DRAWN AEROBIC AND ANAEROBIC Blood Culture adequate volume   Culture NO GROWTH 3 DAYS  Final   Report Status PENDING  Incomplete  Culture, blood  (routine x 2)     Status: None (Preliminary result)   Collection Time: 05/10/17  9:13 PM  Result Value Ref Range Status   Specimen Description BLOOD RIGHT WRIST  Final   Special Requests   Final    BOTTLES DRAWN AEROBIC AND ANAEROBIC Blood Culture adequate volume   Culture NO GROWTH 3 DAYS  Final   Report Status PENDING  Incomplete  Urine culture     Status: Abnormal   Collection Time: 05/11/17  2:42 AM  Result Value Ref Range Status   Specimen Description URINE, CLEAN CATCH  Final   Special Requests NONE  Final   Culture <10,000 COLONIES/mL (A)  Final   Report  Status 05/12/2017 FINAL  Final  Culture, respiratory (NON-Expectorated)     Status: None   Collection Time: 05/11/17  3:50 PM  Result Value Ref Range Status   Specimen Description SPUTUM  Final   Special Requests NONE  Final   Gram Stain   Final    MODERATE WBC PRESENT,BOTH PMN AND MONONUCLEAR FEW SQUAMOUS EPITHELIAL CELLS PRESENT FEW GRAM POSITIVE COCCI IN PAIRS IN CHAINS RARE BUDDING YEAST SEEN    Culture MODERATE CANDIDA GLABRATA  Final   Report Status 05/13/2017 FINAL  Final  MRSA PCR Screening     Status: None   Collection Time: 05/13/17  9:37 AM  Result Value Ref Range Status   MRSA by PCR NEGATIVE NEGATIVE Final    Comment:        The GeneXpert MRSA Assay (FDA approved for NASAL specimens only), is one component of a comprehensive MRSA colonization surveillance program. It is not intended to diagnose MRSA infection nor to guide or monitor treatment for MRSA infections.          Radiology Studies: No results found.      Scheduled Meds: . aspirin EC  81 mg Oral Daily  . carvedilol  3.125 mg Oral BID WC  . ezetimibe-simvastatin  1 tablet Oral Daily  . furosemide  60 mg Intravenous BID  . insulin aspart  0-9 Units Subcutaneous Q4H  . insulin glargine  8 Units Subcutaneous QHS  . levothyroxine  50 mcg Oral QAC breakfast  . losartan  12.5 mg Oral Daily  . potassium chloride  40 mEq Oral BID  .  sodium chloride flush  3 mL Intravenous Q12H   Continuous Infusions: . sodium chloride 250 mL (05/13/17 0709)  . heparin 1,250 Units/hr (05/13/17 1933)  . magnesium sulfate 1 - 4 g bolus IVPB    . nitroGLYCERIN Stopped (05/11/17 1817)  . piperacillin-tazobactam (ZOSYN)  IV Stopped (05/14/17 0928)     LOS: 3 days    Time spent: 35 minutes.     Alba CoryBelkys A Rand Etchison, MD [facility] Pager [phone]  If 7PM-7AM, please contact night-coverage [email or web address] Password [email or web address] 05/14/2017, 10:19 AM

## 2017-05-15 ENCOUNTER — Encounter (HOSPITAL_COMMUNITY): Payer: Self-pay | Admitting: Cardiovascular Disease

## 2017-05-15 ENCOUNTER — Encounter (HOSPITAL_COMMUNITY)
Admission: EM | Disposition: A | Discharge status: Home / Self Care | Payer: Self-pay | Source: Home / Self Care | Attending: Internal Medicine

## 2017-05-15 HISTORY — PX: LEFT HEART CATH AND CORS/GRAFTS ANGIOGRAPHY: CATH118250

## 2017-05-15 LAB — CBC
HEMATOCRIT: 34.3 % — AB (ref 36.0–46.0)
HEMOGLOBIN: 10.4 g/dL — AB (ref 12.0–15.0)
MCH: 26.4 pg (ref 26.0–34.0)
MCHC: 30.3 g/dL (ref 30.0–36.0)
MCV: 87.1 fL (ref 78.0–100.0)
Platelets: 311 10*3/uL (ref 150–400)
RBC: 3.94 MIL/uL (ref 3.87–5.11)
RDW: 16.6 % — ABNORMAL HIGH (ref 11.5–15.5)
WBC: 10.1 10*3/uL (ref 4.0–10.5)

## 2017-05-15 LAB — BASIC METABOLIC PANEL
ANION GAP: 9 (ref 5–15)
BUN: 17 mg/dL (ref 6–20)
CHLORIDE: 102 mmol/L (ref 101–111)
CO2: 26 mmol/L (ref 22–32)
CREATININE: 1.16 mg/dL — AB (ref 0.44–1.00)
Calcium: 8.7 mg/dL — ABNORMAL LOW (ref 8.9–10.3)
GFR calc non Af Amer: 46 mL/min — ABNORMAL LOW (ref >60)
GFR, EST AFRICAN AMERICAN: 53 mL/min — AB (ref >60)
Glucose, Bld: 109 mg/dL — ABNORMAL HIGH (ref 65–99)
POTASSIUM: 4.3 mmol/L (ref 3.5–5.1)
SODIUM: 137 mmol/L (ref 135–145)

## 2017-05-15 LAB — CULTURE, BLOOD (ROUTINE X 2)
CULTURE: NO GROWTH
Culture: NO GROWTH
SPECIAL REQUESTS: ADEQUATE
Special Requests: ADEQUATE

## 2017-05-15 LAB — GLUCOSE, CAPILLARY
GLUCOSE-CAPILLARY: 138 mg/dL — AB (ref 65–99)
GLUCOSE-CAPILLARY: 90 mg/dL (ref 65–99)
GLUCOSE-CAPILLARY: 117 mg/dL — AB (ref 65–99)
Glucose-Capillary: 113 mg/dL — ABNORMAL HIGH (ref 65–99)
Glucose-Capillary: 179 mg/dL — ABNORMAL HIGH (ref 65–99)
Glucose-Capillary: 223 mg/dL — ABNORMAL HIGH (ref 65–99)
Glucose-Capillary: 265 mg/dL — ABNORMAL HIGH (ref 65–99)

## 2017-05-15 LAB — HEPARIN LEVEL (UNFRACTIONATED): Heparin Unfractionated: 0.6 IU/mL (ref 0.30–0.70)

## 2017-05-15 SURGERY — LEFT HEART CATH AND CORS/GRAFTS ANGIOGRAPHY
Anesthesia: LOCAL

## 2017-05-15 MED ORDER — LIDOCAINE HCL (PF) 1 % IJ SOLN
INTRAMUSCULAR | Status: AC
  Filled 2017-05-15: qty 30

## 2017-05-15 MED ORDER — SODIUM CHLORIDE 0.9 % IV SOLN
INTRAVENOUS | Status: AC | PRN
  Administered 2017-05-15: 10 mL/h via INTRAVENOUS

## 2017-05-15 MED ORDER — SODIUM CHLORIDE 0.9 % IV SOLN: INTRAVENOUS | Status: AC

## 2017-05-15 MED ORDER — CLOPIDOGREL BISULFATE 75 MG PO TABS
75.0000 mg | ORAL_TABLET | Freq: Every day | ORAL | Status: DC
  Administered 2017-05-16 – 2017-05-17 (×2): 75 mg via ORAL
  Filled 2017-05-15 (×2): qty 1

## 2017-05-15 MED ORDER — ASPIRIN 81 MG PO CHEW
81.0000 mg | CHEWABLE_TABLET | Freq: Every day | ORAL | Status: DC
  Administered 2017-05-16: 81 mg via ORAL
  Filled 2017-05-15 (×2): qty 1

## 2017-05-15 MED ORDER — MIDAZOLAM HCL 2 MG/2ML IJ SOLN
INTRAMUSCULAR | Status: AC
  Filled 2017-05-15: qty 2

## 2017-05-15 MED ORDER — SODIUM CHLORIDE 0.9 % IV SOLN: 250.0000 mL | INTRAVENOUS | Status: DC | PRN

## 2017-05-15 MED ORDER — IOPAMIDOL (ISOVUE-370) INJECTION 76%
INTRAVENOUS | Status: DC | PRN
  Administered 2017-05-15: 135 mL via INTRA_ARTERIAL

## 2017-05-15 MED ORDER — HEPARIN (PORCINE) IN NACL 2-0.9 UNIT/ML-% IJ SOLN
INTRAMUSCULAR | Status: AC | PRN
  Administered 2017-05-15: 1000 mL

## 2017-05-15 MED ORDER — SODIUM CHLORIDE 0.9% FLUSH: 3.0000 mL | INTRAVENOUS | Status: DC | PRN

## 2017-05-15 MED ORDER — SODIUM CHLORIDE 0.9% FLUSH
3.0000 mL | Freq: Two times a day (BID) | INTRAVENOUS | Status: DC
  Administered 2017-05-15 – 2017-05-16 (×2): 3 mL via INTRAVENOUS

## 2017-05-15 MED ORDER — IOPAMIDOL (ISOVUE-370) INJECTION 76%
INTRAVENOUS | Status: AC
  Filled 2017-05-15: qty 100

## 2017-05-15 MED ORDER — IOPAMIDOL (ISOVUE-370) INJECTION 76%
INTRAVENOUS | Status: AC
  Filled 2017-05-15: qty 50

## 2017-05-15 MED ORDER — VENLAFAXINE HCL ER 75 MG PO CP24
150.0000 mg | ORAL_CAPSULE | Freq: Every day | ORAL | Status: DC
  Administered 2017-05-15 – 2017-05-20 (×5): 150 mg via ORAL
  Filled 2017-05-15 (×2): qty 2
  Filled 2017-05-15: qty 1
  Filled 2017-05-15 (×2): qty 2

## 2017-05-15 MED ORDER — FENTANYL CITRATE (PF) 100 MCG/2ML IJ SOLN
INTRAMUSCULAR | Status: AC
  Filled 2017-05-15: qty 2

## 2017-05-15 MED ORDER — LIDOCAINE HCL (PF) 1 % IJ SOLN
INTRAMUSCULAR | Status: DC | PRN
  Administered 2017-05-15: 30 mL

## 2017-05-15 MED ORDER — MIDAZOLAM HCL 2 MG/2ML IJ SOLN
INTRAMUSCULAR | Status: DC | PRN
  Administered 2017-05-15: 1 mg via INTRAVENOUS

## 2017-05-15 MED ORDER — SODIUM CHLORIDE 0.9 % IV SOLN
INTRAVENOUS | Status: DC
  Administered 2017-05-15: 06:00:00 via INTRAVENOUS

## 2017-05-15 MED ORDER — FENTANYL CITRATE (PF) 100 MCG/2ML IJ SOLN
INTRAMUSCULAR | Status: DC | PRN
  Administered 2017-05-15: 25 ug via INTRAVENOUS

## 2017-05-15 MED ORDER — ACETAMINOPHEN 325 MG PO TABS: 650.0000 mg | ORAL_TABLET | ORAL | Status: DC | PRN

## 2017-05-15 MED ORDER — HEPARIN (PORCINE) IN NACL 100-0.45 UNIT/ML-% IJ SOLN
1250.0000 [IU]/h | INTRAMUSCULAR | Status: DC
  Administered 2017-05-15 – 2017-05-16 (×2): 1250 [IU]/h via INTRAVENOUS
  Filled 2017-05-15 (×2): qty 250

## 2017-05-15 MED ORDER — HEPARIN (PORCINE) IN NACL 2-0.9 UNIT/ML-% IJ SOLN
INTRAMUSCULAR | Status: AC
  Filled 2017-05-15: qty 1000

## 2017-05-15 MED ORDER — ONDANSETRON HCL 4 MG/2ML IJ SOLN: 4.0000 mg | Freq: Four times a day (QID) | INTRAMUSCULAR | Status: DC | PRN

## 2017-05-15 SURGICAL SUPPLY — 15 items
CATH EXPO 5F MPA-1 (CATHETERS) ×2 IMPLANT
CATH INFINITI 5 FR IM (CATHETERS) ×1 IMPLANT
CATH INFINITI 5 FR RCB (CATHETERS) ×2 IMPLANT
CATH INFINITI 5FR ANG PIGTAIL (CATHETERS) ×1 IMPLANT
CATH INFINITI MULTIPACK ST 5F (CATHETERS) ×1 IMPLANT
GUIDEWIRE ANGLED .035X150CM (WIRE) ×1 IMPLANT
KIT ENCORE 26 ADVANTAGE (KITS) IMPLANT
KIT HEART LEFT (KITS) ×2 IMPLANT
PACK CARDIAC CATHETERIZATION (CUSTOM PROCEDURE TRAY) ×2 IMPLANT
SHEATH PINNACLE 5F 10CM (SHEATH) ×1 IMPLANT
SHEATH PINNACLE 6F 10CM (SHEATH) IMPLANT
SYR MEDRAD MARK V 150ML (SYRINGE) ×3 IMPLANT
TRANSDUCER W/STOPCOCK (MISCELLANEOUS) ×2 IMPLANT
TUBING CIL FLEX 10 FLL-RA (TUBING) ×2 IMPLANT
TUBING CONTRAST HIGH PRESS 20 (MISCELLANEOUS) ×2 IMPLANT

## 2017-05-15 NOTE — Interval H&P Note (Signed)
Cath Lab Visit (complete for each Cath Lab visit)  Clinical Evaluation Leading to the Procedure:   ACS: Yes.    Non-ACS:    Anginal Classification: CCS IV  Anti-ischemic medical therapy: Minimal Therapy (1 class of medications)  Non-Invasive Test Results: No non-invasive testing performed  Prior CABG: Previous CABG      History and Physical Interval Note:  05/15/2017 8:30 AM  [name]  has presented today for surgery, with the diagnosis of hf  The various methods of treatment have been discussed with the patient and family. After consideration of risks, benefits and other options for treatment, the patient has consented to  Procedure(s): LEFT HEART CATH AND CORS/GRAFTS ANGIOGRAPHY (N/A) as a surgical intervention .  The patient's history has been reviewed, patient examined, no change in status, stable for surgery.  I have reviewed the patient's chart and labs.  Questions were answered to the patient's satisfaction.     Nicki Guadalajarahomas Kendra Grissett

## 2017-05-15 NOTE — Progress Notes (Signed)
Patient returned from the cath lab with orders to monitor right groin site, "level 0" and bed rest until 4pm.  Telemetry monitor applied and CCMD notified.  Will continue to monitor.

## 2017-05-15 NOTE — Progress Notes (Signed)
ANTICOAGULATION CONSULT NOTE - Follow up Consult  Pharmacy Consult for Heparin  Indication: chest pain/ACS  Allergies  Allergen Reactions  . Crestor [Rosuvastatin] Other (See Comments)    unknown  . Lipitor [Atorvastatin] Other (See Comments)    Very bad pain in hips and joints    Patient Measurements: Height: 5\' 2"  (157.5 cm) Weight: 133 lb 14.4 oz (60.7 kg) IBW/kg (Calculated) : 50.1  Vital Signs: Temp: 98.1 F (36.7 C) (01/08 0425) Temp Source: Oral (01/08 0425) BP: 115/53 (01/08 1035) Pulse Rate: 79 (01/08 1035)  Labs: Recent Labs    05/13/17 0257 05/14/17 0312 05/14/17 1637 05/15/17 0351  HGB 11.4* 10.7*  --  10.4*  HCT 36.7 35.9*  --  34.3*  PLT 321 343  --  311  LABPROT  --   --  15.1  --   INR  --   --  1.20  --   HEPARINUNFRC 0.41 0.63  --  0.60  CREATININE 1.08* 1.07*  --  1.16*   Estimated Creatinine Clearance: 37 mL/min (A) (by C-G formula based on SCr of 1.16 mg/dL (H)).  Medical History: Past Medical History:  Diagnosis Date  . Cholelithiases   . Coronary artery disease 02/2007   CABG  . Diabetes (HCC)   . Dyslipidemia   . HTN (hypertension)   . Hypothyroid   . PAF (paroxysmal atrial fibrillation) (HCC)    post op  . S/P CABG x 4 2008   Assessment: 72 y/o F here with declining health since D/C to rehab facility. Having weakness, fever, chills, shortness of breath. Pt found to have elevated troponin, no trending down, on heparin.  Patient went to cath today and pharmacy consulted to restart heparin 10 hours after sheath removal without bolus. Sheath was removed and bedrest begins at 1030. Hgb trending down to 10.4 today, platelets remain stable. No signs/symptoms of bleeding.    Goals of Therapy:  Heparin level 0.3-0.7 units/ml Monitor platelets by anticoagulation protocol: Yes   Plan:  Restart heparin gtt at 1250 units/hr tonight at 2030 Follow up with 8 hour heparin level Daily heparin level/CBC Monitor for s/sx of bleeding  Thank  you for allowing us to participate in this patients care.  Girard CooterKimberly Perkins, PharmD Clinical Pharmacist  Pager: [phone] Clinical Phone for 05/15/2017 until 3:30pm: [phone] If after 3:30pm, please call main pharmacy at [phone] 05/15/2017 10:48 AM

## 2017-05-15 NOTE — Progress Notes (Signed)
Inpatient Diabetes Program Recommendations  AACE/ADA: New Consensus Statement on Inpatient Glycemic Control (2015)  Target Ranges:  Prepandial:   less than 140 mg/dL      Peak postprandial:   less than 180 mg/dL (1-2 hours)      Critically ill patients:  140 - 180 mg/dL   Lab Results  Component Value Date   GLUCAP 90 05/15/2017   HGBA1C 8.8 (H) 05/11/2017    Review of Glycemic Control Results for Tara BarterGLASS, Valjean K (MRN [id number]) as of 05/15/2017 09:24  Ref. Range 05/14/2017 07:35 05/14/2017 11:10 05/14/2017 16:15 05/14/2017 19:57 05/15/2017 02:28 05/15/2017 04:39  Glucose-Capillary Latest Ref Range: 65 - 99 mg/dL 409127 (H) 811223 (H) 914228 (H) 261 (H) 138 (H) 90   Diabetes history: DM2 Outpatient Diabetes medications: Lantus 14 units daily + Novolog 1-15 units tid meal coverage Current orders for Inpatient glycemic control: Lantus 8 units + Novolog sensitive correction q 4 hrs  Inpatient Diabetes Program Recommendations:   After procedure, when patient is eating: -Change Novolog correction to tid + hs 0-5 units -Add Novolog 3 units tid meal coverage if eats 50%  Thank you, Darel HongJudy E. Antonetta Clanton, RN, MSN, CDE  Diabetes Coordinator Inpatient Glycemic Control Team Team Pager [phone] (8am-5pm) 05/15/2017 9:29 AM

## 2017-05-15 NOTE — Progress Notes (Signed)
PROGRESS NOTE    [name]  NWG:[id number] DOB: 09/10/1945 DOA: 05/10/2017 PCP: Dorisann Frames, MD    Brief Narrative: [name] is an 72 y.o. female past medical history of systolic heart failure the previous 2D echo that showed an EF of 20% is post CABG, paroxysmal atrial fibrillation diabetes mellitus type 2 who presents with complaints of chest pain and shortness of breath.   Assessment & Plan:   Principal Problem:   NSTEMI (non-ST elevated myocardial infarction) [facility]) Active Problems:   CAD- CABG X 4 10/08   Diabetes (HCC)   Hypothyroid   SOB (shortness of breath)   Acute respiratory failure with hypoxia (HCC)   Acute on chronic combined systolic and diastolic congestive heart failure (HCC)   Hyperkalemia  Acute respiratory failure with hypoxia (HCC)/NSTEMI with acute on chronic combined systolic and diastolic congestive heart failure (HCC): S/P thoracentesis. O.75 L fluid removed. Continue with IV lasix. 60 mg IV BID>  3 L oxygen.  Weight 147--143---133 Improved.   NSTEMI  Troponin peak to 9.  Cardiology following.  She will need cath next this coming week.  Continue with heparin Gtt.  Underwent cath 1-08, right CAD, needs PCI on Thursday.   SIRS; sepsis, PNA.  Continue with Zosyn day 5/5 Lactic acidosis. Hold ACE , SBP soft. UA negative.  Follow Blood cultures; no growth.  Strep pneumonia negative.  Chest x ray with pleural effusion and bibasilar atelectasis vs infiltrates.  Legionella antigen negative, sputum culture grew candida glabrata. Discussed with ID this is not a pulmonary pathogen , no need to terat it.  MRSA PCR negative, vancomycin stopped.  Urine culture insignificant growth.   Diabetes mellitus type 2 with a last A1c of 10.2: Continue sliding scale insulin.  Hypoglycemia. Treated with D 50%  Hb A1c 8.8 poor controlled.  Continue with lantus.   Hyperkalemia: Hold ACE.  Received kayexalate.  Resolved. Now hypokalemia. Replete    Dyslipidemia: Continue statins.  Hypothyroidism: Continue Synthroid.  TSH 3.6, normal.   Left-sided pleural effusion: Underwent thoracentesis 1-04. 750 cc L fluid removed.  Iv lasix.   Hypomagnesemia; replete. repeat labs in am.     DVT prophylaxis: Heparin Code Status: full code.  Family Communication: care discussed with patient.  Disposition Plan: remain in the step down unit.   Consultants:   Cardiology   IR   Procedures:  Thoracentesis.     Antimicrobials:   Vancomycin 05-11-2017  Zosyn 05-11-2017   Subjective: She came from cath. Report improvement of dyspnea.    Objective: Vitals:   05/15/17 1035 05/15/17 1100 05/15/17 1102 05/15/17 1230  BP: (!) 115/53 126/70 113/72 113/77  Pulse: 79  77 87  Resp: 15  18 (!) 23  Temp:   98.2 F (36.8 C) (!) 97.5 F (36.4 C)  TempSrc:   Oral Oral  SpO2: 95%  94% 98%  Weight:      Height:        Intake/Output Summary (Last 24 hours) at 05/15/2017 1314 Last data filed at 05/15/2017 0606 Gross per 24 hour  Intake 355.17 ml  Output 800 ml  Net -444.83 ml   Filed Weights   05/13/17 0230 05/14/17 0448 05/15/17 0425  Weight: 67.1 kg (147 lb 14.9 oz) 65 kg (143 lb 4.8 oz) 60.7 kg (133 lb 14.4 oz)    Examination:  General exam: NAD Respiratory system ; Bilateral cackles.  Cardiovascular system: S 1, S 2 RRR Gastrointestinal system: BS present, soft, nt Central nervous system: non  focal.  Extremities: symmetric power.  Skin: no rash     Data Reviewed: I have personally reviewed following labs and imaging studies  CBC: Recent Labs  Lab 05/10/17 2005 05/12/17 0235 05/13/17 0257 05/14/17 0312 05/15/17 0351  WBC 11.7* 13.0* 11.7* 11.8* 10.1  HGB 11.4* 12.1 11.4* 10.7* 10.4*  HCT 37.6 40.1 36.7 35.9* 34.3*  MCV 86.4 86.2 86.8 86.1 87.1  PLT 445* 401* 321 343 311   Basic Metabolic Panel: Recent Labs  Lab 05/11/17 1535 05/12/17 0235 05/13/17 0257 05/14/17 0312 05/15/17 0351  NA 138 136  134* 137 137  K 4.1 3.6 3.1* 3.3* 4.3  CL 97* 96* 98* 99* 102  CO2 31 30 26 28 26   GLUCOSE 80 51* 86 155* 109*  BUN 13 13 16 17 17   CREATININE 0.99 0.99 1.08* 1.07* 1.16*  CALCIUM 9.5 8.8* 8.3* 8.4* 8.7*  MG  --   --   --  1.5*  --    GFR: Estimated Creatinine Clearance: 37 mL/min (A) (by C-G formula based on SCr of 1.16 mg/dL (H)). Liver Function Tests: Recent Labs  Lab 05/10/17 2120  AST 40  ALT 15  ALKPHOS 133*  BILITOT 0.6  PROT 7.3  ALBUMIN 3.4*   No results for input(s): LIPASE, AMYLASE in the last 168 hours. No results for input(s): AMMONIA in the last 168 hours. Coagulation Profile: Recent Labs  Lab 05/14/17 1637  INR 1.20   Cardiac Enzymes: Recent Labs  Lab 05/10/17 2120 05/11/17 0242 05/11/17 0423 05/11/17 0936  TROPONINI 8.59* 9.85* 9.41* 7.34*   BNP (last 3 results) No results for input(s): PROBNP in the last 8760 hours. HbA1C: No results for input(s): HGBA1C in the last 72 hours. CBG: Recent Labs  Lab 05/14/17 1957 05/15/17 0228 05/15/17 0439 05/15/17 1015 05/15/17 1213  GLUCAP 261* 138* 90 113* 117*   Lipid Profile: No results for input(s): CHOL, HDL, LDLCALC, TRIG, CHOLHDL, LDLDIRECT in the last 72 hours. Thyroid Function Tests: No results for input(s): TSH, T4TOTAL, FREET4, T3FREE, THYROIDAB in the last 72 hours. Anemia Panel: No results for input(s): VITAMINB12, FOLATE, FERRITIN, TIBC, IRON, RETICCTPCT in the last 72 hours. Sepsis Labs: Recent Labs  Lab 05/11/17 0242 05/11/17 0423 05/12/17 0709 05/12/17 1140  PROCALCITON  --  <0.10  --   --   LATICACIDVEN 2.5* 3.3* 2.5* 3.2*    Recent Results (from the past 240 hour(s))  Culture, blood (routine x 2)     Status: None   Collection Time: 05/10/17  9:03 PM  Result Value Ref Range Status   Specimen Description BLOOD RIGHT ANTECUBITAL  Final   Special Requests   Final    BOTTLES DRAWN AEROBIC AND ANAEROBIC Blood Culture adequate volume   Culture NO GROWTH 5 DAYS  Final   Report  Status 05/15/2017 FINAL  Final  Culture, blood (routine x 2)     Status: None   Collection Time: 05/10/17  9:13 PM  Result Value Ref Range Status   Specimen Description BLOOD RIGHT WRIST  Final   Special Requests   Final    BOTTLES DRAWN AEROBIC AND ANAEROBIC Blood Culture adequate volume   Culture NO GROWTH 5 DAYS  Final   Report Status 05/15/2017 FINAL  Final  Urine culture     Status: Abnormal   Collection Time: 05/11/17  2:42 AM  Result Value Ref Range Status   Specimen Description URINE, CLEAN CATCH  Final   Special Requests NONE  Final   Culture <10,000 COLONIES/mL (A)  Final   Report Status 05/12/2017 FINAL  Final  Culture, respiratory (NON-Expectorated)     Status: None   Collection Time: 05/11/17  3:50 PM  Result Value Ref Range Status   Specimen Description SPUTUM  Final   Special Requests NONE  Final   Gram Stain   Final    MODERATE WBC PRESENT,BOTH PMN AND MONONUCLEAR FEW SQUAMOUS EPITHELIAL CELLS PRESENT FEW GRAM POSITIVE COCCI IN PAIRS IN CHAINS RARE BUDDING YEAST SEEN    Culture MODERATE CANDIDA GLABRATA  Final   Report Status 05/13/2017 FINAL  Final  MRSA PCR Screening     Status: None   Collection Time: 05/13/17  9:37 AM  Result Value Ref Range Status   MRSA by PCR NEGATIVE NEGATIVE Final    Comment:        The GeneXpert MRSA Assay (FDA approved for NASAL specimens only), is one component of a comprehensive MRSA colonization surveillance program. It is not intended to diagnose MRSA infection nor to guide or monitor treatment for MRSA infections.          Radiology Studies: No results found.      Scheduled Meds: . [START ON 05/16/2017] aspirin  81 mg Oral Daily  . carvedilol  3.125 mg Oral BID WC  . [START ON 05/16/2017] clopidogrel  75 mg Oral Q breakfast  . ezetimibe-simvastatin  1 tablet Oral Daily  . furosemide  60 mg Intravenous BID  . insulin aspart  0-9 Units Subcutaneous Q4H  . insulin glargine  8 Units Subcutaneous QHS  .  levothyroxine  50 mcg Oral QAC breakfast  . losartan  12.5 mg Oral Daily  . sodium chloride flush  3 mL Intravenous Q12H  . sodium chloride flush  3 mL Intravenous Q12H   Continuous Infusions: . sodium chloride 250 mL (05/13/17 0709)  . sodium chloride 10 mL/hr at 05/15/17 0606  . sodium chloride 60 mL/hr at 05/15/17 1030  . sodium chloride    . heparin    . nitroGLYCERIN Stopped (05/11/17 1817)  . piperacillin-tazobactam (ZOSYN)  IV Stopped (05/15/17 1030)     LOS: 4 days    Time spent: 35 minutes.     Alba CoryBelkys A Joaquina Nissen, MD [facility] Pager [phone]  If 7PM-7AM, please contact night-coverage [email or web address] Password [email or web address] 05/15/2017, 1:14 PM

## 2017-05-15 NOTE — Progress Notes (Signed)
Site area: rt groin fa sheath Site Prior to Removal:  Level 0 Pressure Applied For: 20 minutes Manual:   yes Patient Status During Pull:  stable Post Pull Site:  Level 0 Post Pull Instructions Given:  yes Post Pull Pulses Present: dopplered Dressing Applied:  Gauze and tegaderm Bedrest begins @ 1035 Comments:   

## 2017-05-16 LAB — BASIC METABOLIC PANEL
Anion gap: 10 (ref 5–15)
BUN: 15 mg/dL (ref 6–20)
CHLORIDE: 95 mmol/L — AB (ref 101–111)
CO2: 30 mmol/L (ref 22–32)
Calcium: 8.7 mg/dL — ABNORMAL LOW (ref 8.9–10.3)
Creatinine, Ser: 1.25 mg/dL — ABNORMAL HIGH (ref 0.44–1.00)
GFR calc Af Amer: 48 mL/min — ABNORMAL LOW (ref >60)
GFR calc non Af Amer: 42 mL/min — ABNORMAL LOW (ref >60)
GLUCOSE: 129 mg/dL — AB (ref 65–99)
POTASSIUM: 3.8 mmol/L (ref 3.5–5.1)
Sodium: 135 mmol/L (ref 135–145)

## 2017-05-16 LAB — HEPARIN LEVEL (UNFRACTIONATED): HEPARIN UNFRACTIONATED: 0.66 [IU]/mL (ref 0.30–0.70)

## 2017-05-16 LAB — GLUCOSE, CAPILLARY
GLUCOSE-CAPILLARY: 173 mg/dL — AB (ref 65–99)
GLUCOSE-CAPILLARY: 262 mg/dL — AB (ref 65–99)
GLUCOSE-CAPILLARY: 119 mg/dL — AB (ref 65–99)
Glucose-Capillary: 61 mg/dL — ABNORMAL LOW (ref 65–99)
Glucose-Capillary: 83 mg/dL (ref 65–99)
Glucose-Capillary: 159 mg/dL — ABNORMAL HIGH (ref 65–99)

## 2017-05-16 LAB — MAGNESIUM: Magnesium: 2.1 mg/dL (ref 1.7–2.4)

## 2017-05-16 LAB — CBC
HCT: 35.2 % — ABNORMAL LOW (ref 36.0–46.0)
Hemoglobin: 10.6 g/dL — ABNORMAL LOW (ref 12.0–15.0)
MCH: 26.4 pg (ref 26.0–34.0)
MCHC: 30.1 g/dL (ref 30.0–36.0)
MCV: 87.8 fL (ref 78.0–100.0)
PLATELETS: 303 10*3/uL (ref 150–400)
RBC: 4.01 MIL/uL (ref 3.87–5.11)
RDW: 16.6 % — AB (ref 11.5–15.5)
WBC: 11 10*3/uL — AB (ref 4.0–10.5)

## 2017-05-16 MED ORDER — INSULIN GLARGINE 100 UNIT/ML ~~LOC~~ SOLN
6.0000 [IU] | Freq: Every day | SUBCUTANEOUS | Status: DC
  Administered 2017-05-16 – 2017-05-19 (×4): 6 [IU] via SUBCUTANEOUS
  Filled 2017-05-16 (×6): qty 0.06

## 2017-05-16 NOTE — Care Management Important Message (Signed)
Important Message  Patient Details  Name: [name] MRN: [id number] Date of Birth: 1945/08/04   Medicare Important Message Given:  Yes    Kyla BalzarineShealy, Kanchan Gal Abena 05/16/2017, 11:17 AM

## 2017-05-16 NOTE — Progress Notes (Signed)
Hypoglycemic Event  CBG: 61  Treatment: 15 GM carbohydrate snack  Symptoms: None  Follow-up CBG: Time:0506 CBG Result:83  Possible Reasons for Event: Unknown     Gregor HamsWade, Reganne Messerschmidt

## 2017-05-16 NOTE — Progress Notes (Signed)
ANTICOAGULATION CONSULT NOTE - Follow Up Consult  Pharmacy Consult for Heparin Indication: chest pain/ACS  Allergies  Allergen Reactions  . Crestor [Rosuvastatin] Other (See Comments)    unknown  . Lipitor [Atorvastatin] Other (See Comments)    Very bad pain in hips and joints    Patient Measurements: Height: 5\' 2"  (157.5 cm) Weight: 132 lb 7.9 oz (60.1 kg) IBW/kg (Calculated) : 50.1 Heparin Dosing Weight: 60 kg  Vital Signs: Temp: 98.3 F (36.8 C) (01/09 0758) Temp Source: Oral (01/09 0758) BP: 127/72 (01/09 0758) Pulse Rate: 78 (01/09 0758)  Labs: Recent Labs    05/14/17 0312 05/14/17 1637 05/15/17 0351 05/16/17 0551  HGB 10.7*  --  10.4* 10.6*  HCT 35.9*  --  34.3* 35.2*  PLT 343  --  311 303  LABPROT  --  15.1  --   --   INR  --  1.20  --   --   HEPARINUNFRC 0.63  --  0.60 0.66  CREATININE 1.07*  --  1.16* 1.25*    Estimated Creatinine Clearance: 31.7 mL/min (A) (by C-G formula based on SCr of 1.25 mg/dL (H)).  Assessment:   Heparin drip resumed last night ~10 hrs post-cath.  For possible atherectomy on 1/10.   Heparin level is therapeutic (0.66) on 1250 units/hr.  Goal of Therapy:  Heparin level 0.3-0.7 units/ml Monitor platelets by anticoagulation protocol: Yes   Plan:   Continue heparin drip at 1250 units/hr.  Daily heparin level and CBC while on heparin.  Dennie FettersEgan, Philbert Ocallaghan Donovan, [place] Pager: [phone] 05/16/2017,10:09 AM
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
## 2017-05-16 NOTE — Progress Notes (Addendum)
PROGRESS NOTE    [name]  ZOX:[id number] DOB: 07-18-1945 DOA: 05/10/2017 PCP: Dorisann FramesBalan, Bindubal, MD    Brief Narrative: [name] is an 72 y.o. female past medical history of systolic heart failure the previous 2D echo that showed an EF of 20% is post CABG, paroxysmal atrial fibrillation diabetes mellitus type 2 who presents with complaints of chest pain and shortness of breath.   Patient admitted with acute hypoxic respiratory failure thought to be related to systolic HF exacerbation, bilateral; pleural effusion and PNA. She was treated with IV lasix, had thoracentesis and IV antibiotics. She has been diagnosed with NSTEMI, underwent cath which showed occlusion of right CA. She is to have PCI on Thursday.  Follow chest  X ray 1-10 to follow pleural effusion.   Assessment & Plan:   Principal Problem:   Acute respiratory failure with hypoxia (HCC) Active Problems:   CAD- CABG X 4 10/08   Diabetes (HCC)   Hypothyroid   SOB (shortness of breath)   Acute on chronic combined systolic and diastolic congestive heart failure (HCC)   Hyperkalemia   NSTEMI (non-ST elevated myocardial infarction) (HCC)  Acute respiratory failure with hypoxia (HCC)/NSTEMI with acute on chronic combined systolic and diastolic congestive heart failure (HCC): S/P thoracentesis. O.75 L fluid removed. Treated with with IV lasix. 60 mg IV BID> Lasix on hold today in preparation for Cath 1-10 3 L oxygen.  Weight 147--143---133--132 Improved.   NSTEMI  Troponin peak to 9.  Cardiology following.  She will need cath next this coming week.  Continue with heparin Gtt.  Underwent cath 1-08, right CAD, needs PCI on Thursday.  She denies chest pain./   SIRS; sepsis, PNA.  Continue with Zosyn day 5/5 Lactic acidosis. Hold ACE , SBP soft. UA negative.  Follow Blood cultures; no growth.  Strep pneumonia negative.  Chest x ray with pleural effusion and bibasilar atelectasis vs infiltrates.  Legionella antigen  negative, sputum culture grew candida glabrata. Discussed with ID this is not a pulmonary pathogen , no need to treat it.  MRSA PCR negative, vancomycin stopped.  Urine culture insignificant growth.  Completed antibiotics course.   Diabetes mellitus type 2 with a last A1c of 10.2: Continue sliding scale insulin.  Hypoglycemia. Treated with D 50%  Hb A1c 8.8 poor controlled.  Continue with lantus.   Hyperkalemia: Hold ACE.  Received kayexalate.  Resolved. Now hypokalemia. Replete   Dyslipidemia: Continue statins.  Hypothyroidism: Continue Synthroid.  TSH 3.6, normal.   Left-sided pleural effusion: Underwent thoracentesis 1-04. 750 cc L fluid removed.  Iv lasix.   Hypomagnesemia; replete. repeat labs in am.     DVT prophylaxis: Heparin Code Status: full code.  Family Communication: care discussed with patient.  Disposition Plan: remain in the step down unit.   Consultants:   Cardiology   IR   Procedures:  Thoracentesis.     Antimicrobials:   Vancomycin 05-11-2017  Zosyn 05-11-2017   Subjective: She is breathing better, denies chest pain/   Objective: Vitals:   05/15/17 2030 05/15/17 2320 05/16/17 0422 05/16/17 0758  BP:  109/62 (!) 108/92 127/72  Pulse:    78  Resp:   20 16  Temp: 98.6 F (37 C) 98.2 F (36.8 C) 97.9 F (36.6 C) 98.3 F (36.8 C)  TempSrc: Oral Oral Oral Oral  SpO2:    98%  Weight:   60.1 kg (132 lb 7.9 oz)   Height:        Intake/Output Summary (Last  24 hours) at 05/16/2017 1516 Last data filed at 05/16/2017 0427 Gross per 24 hour  Intake 1308.42 ml  Output 1450 ml  Net -141.58 ml   Filed Weights   05/14/17 0448 05/15/17 0425 05/16/17 0422  Weight: 65 kg (143 lb 4.8 oz) 60.7 kg (133 lb 14.4 oz) 60.1 kg (132 lb 7.9 oz)    Examination:  General exam: NAD Respiratory system ; Bilateral crackles.  Cardiovascular system: S 1, S 2 RRR Gastrointestinal system: BS present, soft, nt Central nervous system: non focal.    Extremities: symmetric power.  Skin: no rash     Data Reviewed: I have personally reviewed following labs and imaging studies  CBC: Recent Labs  Lab 05/12/17 0235 05/13/17 0257 05/14/17 0312 05/15/17 0351 05/16/17 0551  WBC 13.0* 11.7* 11.8* 10.1 11.0*  HGB 12.1 11.4* 10.7* 10.4* 10.6*  HCT 40.1 36.7 35.9* 34.3* 35.2*  MCV 86.2 86.8 86.1 87.1 87.8  PLT 401* 321 343 311 303   Basic Metabolic Panel: Recent Labs  Lab 05/12/17 0235 05/13/17 0257 05/14/17 0312 05/15/17 0351 05/16/17 0551  NA 136 134* 137 137 135  K 3.6 3.1* 3.3* 4.3 3.8  CL 96* 98* 99* 102 95*  CO2 30 26 28 26 30   GLUCOSE 51* 86 155* 109* 129*  BUN 13 16 17 17 15   CREATININE 0.99 1.08* 1.07* 1.16* 1.25*  CALCIUM 8.8* 8.3* 8.4* 8.7* 8.7*  MG  --   --  1.5*  --  2.1   GFR: Estimated Creatinine Clearance: 31.7 mL/min (A) (by C-G formula based on SCr of 1.25 mg/dL (H)). Liver Function Tests: Recent Labs  Lab 05/10/17 2120  AST 40  ALT 15  ALKPHOS 133*  BILITOT 0.6  PROT 7.3  ALBUMIN 3.4*   No results for input(s): LIPASE, AMYLASE in the last 168 hours. No results for input(s): AMMONIA in the last 168 hours. Coagulation Profile: Recent Labs  Lab 05/14/17 1637  INR 1.20   Cardiac Enzymes: Recent Labs  Lab 05/10/17 2120 05/11/17 0242 05/11/17 0423 05/11/17 0936  TROPONINI 8.59* 9.85* 9.41* 7.34*   BNP (last 3 results) No results for input(s): PROBNP in the last 8760 hours. HbA1C: No results for input(s): HGBA1C in the last 72 hours. CBG: Recent Labs  Lab 05/15/17 2341 05/16/17 0433 05/16/17 0506 05/16/17 0758 05/16/17 1108  GLUCAP 179* 61* 83 173* 262*   Lipid Profile: No results for input(s): CHOL, HDL, LDLCALC, TRIG, CHOLHDL, LDLDIRECT in the last 72 hours. Thyroid Function Tests: No results for input(s): TSH, T4TOTAL, FREET4, T3FREE, THYROIDAB in the last 72 hours. Anemia Panel: No results for input(s): VITAMINB12, FOLATE, FERRITIN, TIBC, IRON, RETICCTPCT in the last 72  hours. Sepsis Labs: Recent Labs  Lab 05/11/17 0242 05/11/17 0423 05/12/17 0709 05/12/17 1140  PROCALCITON  --  <0.10  --   --   LATICACIDVEN 2.5* 3.3* 2.5* 3.2*    Recent Results (from the past 240 hour(s))  Culture, blood (routine x 2)     Status: None   Collection Time: 05/10/17  9:03 PM  Result Value Ref Range Status   Specimen Description BLOOD RIGHT ANTECUBITAL  Final   Special Requests   Final    BOTTLES DRAWN AEROBIC AND ANAEROBIC Blood Culture adequate volume   Culture NO GROWTH 5 DAYS  Final   Report Status 05/15/2017 FINAL  Final  Culture, blood (routine x 2)     Status: None   Collection Time: 05/10/17  9:13 PM  Result Value Ref Range Status  Specimen Description BLOOD RIGHT WRIST  Final   Special Requests   Final    BOTTLES DRAWN AEROBIC AND ANAEROBIC Blood Culture adequate volume   Culture NO GROWTH 5 DAYS  Final   Report Status 05/15/2017 FINAL  Final  Urine culture     Status: Abnormal   Collection Time: 05/11/17  2:42 AM  Result Value Ref Range Status   Specimen Description URINE, CLEAN CATCH  Final   Special Requests NONE  Final   Culture <10,000 COLONIES/mL (A)  Final   Report Status 05/12/2017 FINAL  Final  Culture, respiratory (NON-Expectorated)     Status: None   Collection Time: 05/11/17  3:50 PM  Result Value Ref Range Status   Specimen Description SPUTUM  Final   Special Requests NONE  Final   Gram Stain   Final    MODERATE WBC PRESENT,BOTH PMN AND MONONUCLEAR FEW SQUAMOUS EPITHELIAL CELLS PRESENT FEW GRAM POSITIVE COCCI IN PAIRS IN CHAINS RARE BUDDING YEAST SEEN    Culture MODERATE CANDIDA GLABRATA  Final   Report Status 05/13/2017 FINAL  Final  MRSA PCR Screening     Status: None   Collection Time: 05/13/17  9:37 AM  Result Value Ref Range Status   MRSA by PCR NEGATIVE NEGATIVE Final    Comment:        The GeneXpert MRSA Assay (FDA approved for NASAL specimens only), is one component of a comprehensive MRSA colonization surveillance  program. It is not intended to diagnose MRSA infection nor to guide or monitor treatment for MRSA infections.          Radiology Studies: No results found.      Scheduled Meds: . aspirin  81 mg Oral Daily  . carvedilol  3.125 mg Oral BID WC  . clopidogrel  75 mg Oral Q breakfast  . ezetimibe-simvastatin  1 tablet Oral Daily  . insulin aspart  0-9 Units Subcutaneous Q4H  . insulin glargine  6 Units Subcutaneous QHS  . levothyroxine  50 mcg Oral QAC breakfast  . losartan  12.5 mg Oral Daily  . sodium chloride flush  3 mL Intravenous Q12H  . sodium chloride flush  3 mL Intravenous Q12H  . venlafaxine XR  150 mg Oral Daily   Continuous Infusions: . sodium chloride 250 mL (05/13/17 0709)  . sodium chloride 10 mL/hr at 05/15/17 0606  . sodium chloride    . heparin 1,250 Units/hr (05/15/17 2122)  . nitroGLYCERIN Stopped (05/11/17 1817)     LOS: 5 days    Time spent: 35 minutes.     Alba Cory, MD [facility] Pager [phone]  If 7PM-7AM, please contact night-coverage [email or web address] Password [email or web address] 05/16/2017, 3:16 PM
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
## 2017-05-17 ENCOUNTER — Inpatient Hospital Stay (HOSPITAL_COMMUNITY): Payer: Medicare HMO

## 2017-05-17 ENCOUNTER — Encounter (HOSPITAL_COMMUNITY)
Admission: EM | Disposition: A | Discharge status: Home / Self Care | Payer: Self-pay | Source: Home / Self Care | Attending: Internal Medicine

## 2017-05-17 HISTORY — PX: CORONARY ATHERECTOMY: CATH118238

## 2017-05-17 HISTORY — PX: LEFT HEART CATH AND CORS/GRAFTS ANGIOGRAPHY: CATH118250

## 2017-05-17 HISTORY — PX: CORONARY STENT INTERVENTION: CATH118234

## 2017-05-17 LAB — BASIC METABOLIC PANEL
ANION GAP: 9 (ref 5–15)
BUN: 18 mg/dL (ref 6–20)
CALCIUM: 9 mg/dL (ref 8.9–10.3)
CO2: 31 mmol/L (ref 22–32)
Chloride: 97 mmol/L — ABNORMAL LOW (ref 101–111)
Creatinine, Ser: 1.14 mg/dL — ABNORMAL HIGH (ref 0.44–1.00)
GFR, EST AFRICAN AMERICAN: 54 mL/min — AB (ref >60)
GFR, EST NON AFRICAN AMERICAN: 47 mL/min — AB (ref >60)
Glucose, Bld: 148 mg/dL — ABNORMAL HIGH (ref 65–99)
Potassium: 3.8 mmol/L (ref 3.5–5.1)
SODIUM: 137 mmol/L (ref 135–145)

## 2017-05-17 LAB — CBC
HCT: 36.1 % (ref 36.0–46.0)
Hemoglobin: 11.2 g/dL — ABNORMAL LOW (ref 12.0–15.0)
MCH: 27.1 pg (ref 26.0–34.0)
MCHC: 31 g/dL (ref 30.0–36.0)
MCV: 87.4 fL (ref 78.0–100.0)
Platelets: 314 10*3/uL (ref 150–400)
RBC: 4.13 MIL/uL (ref 3.87–5.11)
RDW: 16.7 % — ABNORMAL HIGH (ref 11.5–15.5)
WBC: 9.5 10*3/uL (ref 4.0–10.5)

## 2017-05-17 LAB — GLUCOSE, CAPILLARY
GLUCOSE-CAPILLARY: 166 mg/dL — AB (ref 65–99)
GLUCOSE-CAPILLARY: 109 mg/dL — AB (ref 65–99)
Glucose-Capillary: 111 mg/dL — ABNORMAL HIGH (ref 65–99)
Glucose-Capillary: 119 mg/dL — ABNORMAL HIGH (ref 65–99)
Glucose-Capillary: 121 mg/dL — ABNORMAL HIGH (ref 65–99)
Glucose-Capillary: 129 mg/dL — ABNORMAL HIGH (ref 65–99)

## 2017-05-17 LAB — POCT ACTIVATED CLOTTING TIME: Activated Clotting Time: 532 seconds

## 2017-05-17 LAB — HEPARIN LEVEL (UNFRACTIONATED): Heparin Unfractionated: 0.49 [IU]/mL (ref 0.30–0.70)

## 2017-05-17 SURGERY — CORONARY STENT INTERVENTION
Anesthesia: LOCAL

## 2017-05-17 MED ORDER — ASPIRIN 81 MG PO CHEW
81.0000 mg | CHEWABLE_TABLET | Freq: Every day | ORAL | Status: DC
  Administered 2017-05-18 – 2017-05-20 (×3): 81 mg via ORAL
  Filled 2017-05-17 (×3): qty 1

## 2017-05-17 MED ORDER — BIVALIRUDIN BOLUS VIA INFUSION - CUPID
INTRAVENOUS | Status: DC | PRN
  Administered 2017-05-17: 45.075 mg via INTRAVENOUS

## 2017-05-17 MED ORDER — SODIUM CHLORIDE 0.9 % IV SOLN: 250.0000 mL | INTRAVENOUS | Status: DC | PRN

## 2017-05-17 MED ORDER — SODIUM CHLORIDE 0.9 % IV SOLN
INTRAVENOUS | Status: DC
  Administered 2017-05-17: 06:00:00 via INTRAVENOUS

## 2017-05-17 MED ORDER — CLOPIDOGREL BISULFATE 75 MG PO TABS
ORAL_TABLET | ORAL | Status: AC
  Filled 2017-05-17: qty 1

## 2017-05-17 MED ORDER — VIPERSLIDE LUBRICANT OPTIME
TOPICAL | Status: DC | PRN
  Administered 2017-05-17: 11:00:00 via SURGICAL_CAVITY

## 2017-05-17 MED ORDER — HEPARIN (PORCINE) IN NACL 2-0.9 UNIT/ML-% IJ SOLN
INTRAMUSCULAR | Status: AC | PRN
  Administered 2017-05-17: 1000 mL

## 2017-05-17 MED ORDER — HEPARIN (PORCINE) IN NACL 2-0.9 UNIT/ML-% IJ SOLN
INTRAMUSCULAR | Status: AC
  Filled 2017-05-17: qty 1000

## 2017-05-17 MED ORDER — FENTANYL CITRATE (PF) 100 MCG/2ML IJ SOLN
INTRAMUSCULAR | Status: DC | PRN
  Administered 2017-05-17: 25 ug via INTRAVENOUS

## 2017-05-17 MED ORDER — SODIUM CHLORIDE 0.9 % IV SOLN
INTRAVENOUS | Status: AC
  Administered 2017-05-17: 13:00:00 via INTRAVENOUS

## 2017-05-17 MED ORDER — INSULIN ASPART 100 UNIT/ML ~~LOC~~ SOLN
0.0000 [IU] | Freq: Three times a day (TID) | SUBCUTANEOUS | Status: DC
  Administered 2017-05-18 – 2017-05-19 (×2): 3 [IU] via SUBCUTANEOUS
  Administered 2017-05-19: 1 [IU] via SUBCUTANEOUS
  Administered 2017-05-19 – 2017-05-20 (×3): 2 [IU] via SUBCUTANEOUS

## 2017-05-17 MED ORDER — LABETALOL HCL 5 MG/ML IV SOLN: 10.0000 mg | INTRAVENOUS | Status: AC | PRN

## 2017-05-17 MED ORDER — SODIUM CHLORIDE 0.9 % IV SOLN
INTRAVENOUS | Status: AC | PRN
  Administered 2017-05-17: 10 mL/h via INTRAVENOUS

## 2017-05-17 MED ORDER — VERAPAMIL HCL 2.5 MG/ML IV SOLN
INTRAVENOUS | Status: AC
  Filled 2017-05-17: qty 2

## 2017-05-17 MED ORDER — DIAZEPAM 5 MG PO TABS: 5.0000 mg | ORAL_TABLET | Freq: Four times a day (QID) | ORAL | Status: DC | PRN

## 2017-05-17 MED ORDER — LIDOCAINE HCL (PF) 1 % IJ SOLN
INTRAMUSCULAR | Status: DC | PRN
  Administered 2017-05-17: 15 mL
  Administered 2017-05-17: 2 mL

## 2017-05-17 MED ORDER — HYDRALAZINE HCL 20 MG/ML IJ SOLN: 5.0000 mg | INTRAMUSCULAR | Status: AC | PRN

## 2017-05-17 MED ORDER — IOPAMIDOL (ISOVUE-370) INJECTION 76%
INTRAVENOUS | Status: DC | PRN
  Administered 2017-05-17: 195 mL via INTRAVENOUS

## 2017-05-17 MED ORDER — VERAPAMIL HCL 2.5 MG/ML IV SOLN
INTRAVENOUS | Status: DC | PRN
  Administered 2017-05-17: 10 mL via INTRA_ARTERIAL

## 2017-05-17 MED ORDER — CLOPIDOGREL BISULFATE 75 MG PO TABS
75.0000 mg | ORAL_TABLET | Freq: Every day | ORAL | Status: DC
  Administered 2017-05-18 – 2017-05-20 (×3): 75 mg via ORAL
  Filled 2017-05-17 (×3): qty 1

## 2017-05-17 MED ORDER — IOPAMIDOL (ISOVUE-370) INJECTION 76%
INTRAVENOUS | Status: AC
Start: 2017-05-17 — End: ?
  Filled 2017-05-17: qty 100

## 2017-05-17 MED ORDER — LIDOCAINE HCL (PF) 1 % IJ SOLN
INTRAMUSCULAR | Status: AC
Start: 2017-05-17 — End: ?
  Filled 2017-05-17: qty 30

## 2017-05-17 MED ORDER — SODIUM CHLORIDE 0.9 % IV SOLN
INTRAVENOUS | Status: AC | PRN
  Administered 2017-05-17: 11:00:00
  Administered 2017-05-17: 1.75 mg/kg/h via INTRAVENOUS

## 2017-05-17 MED ORDER — SODIUM CHLORIDE 0.9% FLUSH: 3.0000 mL | Freq: Two times a day (BID) | INTRAVENOUS | Status: DC

## 2017-05-17 MED ORDER — HEPARIN SODIUM (PORCINE) 1000 UNIT/ML IJ SOLN
INTRAMUSCULAR | Status: AC
  Filled 2017-05-17: qty 1

## 2017-05-17 MED ORDER — ACETAMINOPHEN 325 MG PO TABS: 650.0000 mg | ORAL_TABLET | ORAL | Status: DC | PRN

## 2017-05-17 MED ORDER — BIVALIRUDIN TRIFLUOROACETATE 250 MG IV SOLR
INTRAVENOUS | Status: AC
  Filled 2017-05-17: qty 250

## 2017-05-17 MED ORDER — MIDAZOLAM HCL 2 MG/2ML IJ SOLN
INTRAMUSCULAR | Status: DC | PRN
  Administered 2017-05-17: 0.5 mg via INTRAVENOUS
  Administered 2017-05-17: 1 mg via INTRAVENOUS

## 2017-05-17 MED ORDER — FENTANYL CITRATE (PF) 100 MCG/2ML IJ SOLN
INTRAMUSCULAR | Status: AC
  Filled 2017-05-17: qty 2

## 2017-05-17 MED ORDER — NITROGLYCERIN 1 MG/10 ML FOR IR/CATH LAB
INTRA_ARTERIAL | Status: AC
  Filled 2017-05-17: qty 10

## 2017-05-17 MED ORDER — SODIUM CHLORIDE 0.9% FLUSH: 3.0000 mL | INTRAVENOUS | Status: DC | PRN

## 2017-05-17 MED ORDER — ONDANSETRON HCL 4 MG/2ML IJ SOLN: 4.0000 mg | Freq: Four times a day (QID) | INTRAMUSCULAR | Status: DC | PRN

## 2017-05-17 MED ORDER — CLOPIDOGREL BISULFATE 300 MG PO TABS
ORAL_TABLET | ORAL | Status: DC | PRN
  Administered 2017-05-17: 75 mg via ORAL

## 2017-05-17 MED ORDER — ASPIRIN 81 MG PO CHEW
81.0000 mg | CHEWABLE_TABLET | ORAL | Status: AC
  Administered 2017-05-17: 81 mg via ORAL

## 2017-05-17 MED ORDER — IOPAMIDOL (ISOVUE-370) INJECTION 76%
INTRAVENOUS | Status: AC
  Filled 2017-05-17: qty 100

## 2017-05-17 MED ORDER — MIDAZOLAM HCL 2 MG/2ML IJ SOLN
INTRAMUSCULAR | Status: AC
  Filled 2017-05-17: qty 2

## 2017-05-17 SURGICAL SUPPLY — 35 items
BALLN SAPPHIRE 2.5X20 (BALLOONS) ×2
BALLN SAPPHIRE ~~LOC~~ 3.0X18 (BALLOONS) ×1 IMPLANT
BALLN SPRINTER MX OTW 2.0X12 (BALLOONS) ×2
BALLOON SAPPHIRE 2.5X20 (BALLOONS) ×1 IMPLANT
BALLOON SPRINTER MX OTW 2.0X12 (BALLOONS) IMPLANT
CABLE ADAPT CONN TEMP 6FT (ADAPTER) ×1 IMPLANT
CATH INFINITI 5 FR IM (CATHETERS) ×1 IMPLANT
CATH S G BIP PACING (SET/KITS/TRAYS/PACK) ×2 IMPLANT
CATH VISTA GUIDE 6FR XBRCA (CATHETERS) ×1 IMPLANT
CATH VISTA GUIDE 7FR JR4 (CATHETERS) ×1 IMPLANT
COVER PRB 48X5XTLSCP FOLD TPE (BAG) IMPLANT
COVER PROBE 5X48 (BAG) ×2
CROWN DIAMONDBACK CLASSIC 1.25 (BURR) ×1 IMPLANT
DEVICE RAD COMP TR BAND LRG (VASCULAR PRODUCTS) ×1 IMPLANT
GLIDESHEATH SLEND A-KIT 6F 22G (SHEATH) ×1 IMPLANT
GLIDESHEATH SLEND SS 6F .021 (SHEATH) ×1 IMPLANT
GUIDE CATH MACH 1 7F AL.75 (CATHETERS) ×1 IMPLANT
GUIDEWIRE INQWIRE 1.5J.035X260 (WIRE) IMPLANT
HOVERMATT SINGLE USE (MISCELLANEOUS) ×1 IMPLANT
INQWIRE 1.5J .035X260CM (WIRE) ×2
KIT ENCORE 26 ADVANTAGE (KITS) ×2 IMPLANT
KIT HEART LEFT (KITS) ×2 IMPLANT
LUBRICANT VIPERSLIDE CORONARY (MISCELLANEOUS) ×1 IMPLANT
PACK CARDIAC CATHETERIZATION (CUSTOM PROCEDURE TRAY) ×2 IMPLANT
SHEATH PINNACLE 6F 10CM (SHEATH) ×1 IMPLANT
SHEATH PINNACLE 7F 10CM (SHEATH) ×1 IMPLANT
SLEEVE REPOSITIONING LENGTH 30 (MISCELLANEOUS) ×1 IMPLANT
STENT SYNERGY DES 2.5X38 (Permanent Stent) ×1 IMPLANT
STENT SYNERGY DES 2.75X16 (Permanent Stent) ×1 IMPLANT
TRANSDUCER W/STOPCOCK (MISCELLANEOUS) ×2 IMPLANT
TUBING CIL FLEX 10 FLL-RA (TUBING) ×2 IMPLANT
WIRE COUGAR XT STRL 300CM (WIRE) ×1 IMPLANT
WIRE EMERALD 3MM-J .035X150CM (WIRE) ×1 IMPLANT
WIRE MARVEL STR TIP 190CM (WIRE) ×1 IMPLANT
WIRE VIPER ADVANCE COR .012TIP (WIRE) ×3 IMPLANT

## 2017-05-17 NOTE — Progress Notes (Signed)
PROGRESS NOTE    [name]  ZOX:[id number] DOB: 1946-03-30 DOA: 05/10/2017 PCP: Dorisann Frames, MD    Brief Narrative: Patient is a 72 y.o. female with past medical history significant for systolic heart failure (Previous 2D echo showed an EF of 20%), S/P CABG, paroxysmal atrial fibrillation diabetes mellitus type 2 who presents with complaints of chest pain and shortness of breath. Work up revealed NSTEMI.   Patient underwent cardiac cath and angioplasty earlier today to RCA. Patient is resting quietly. Seen alongside patient's daughter.  Assessment & Plan:   Principal Problem:   Acute respiratory failure with hypoxia (HCC) Active Problems:   CAD- CABG X 4 10/08   Diabetes (HCC)   Hypothyroid   SOB (shortness of breath)   Acute on chronic combined systolic and diastolic congestive heart failure (HCC)   Hyperkalemia   NSTEMI (non-ST elevated myocardial infarction) (HCC)  Acute respiratory failure with hypoxia (HCC)/NSTEMI with acute on chronic combined systolic and diastolic congestive heart failure (HCC): S/P thoracentesis. O.75 L fluid removed. Treated with with IV lasix. 60 mg IV BID> Lasix on hold today in preparation for Cath 1-10 3 L oxygen.  Weight 147--143---133--132 Improved.   NSTEMI  Troponin peak to 9.  Cardiology following.  She will need cath next this coming week.  Continue with heparin Gtt.  Underwent cath 1-08, right CAD.  Patient underwent PCI to RCA today.     SIRS; sepsis, PNA.  Continue with Zosyn day 5/5 Lactic acidosis. Hold ACE , SBP soft. UA negative.  Follow Blood cultures; no growth.  Strep pneumonia negative.  Chest x ray with pleural effusion and bibasilar atelectasis vs infiltrates.  Legionella antigen negative, sputum culture grew candida glabrata. Discussed with ID this is not a pulmonary pathogen , no need to treat it.  MRSA PCR negative, vancomycin stopped.  Urine culture insignificant growth.  Completed antibiotics course.    Diabetes mellitus type 2 with a last A1c of 10.2: Continue sliding scale insulin.  Hypoglycemia. Treated with D 50%  Hb A1c 8.8 poor controlled.  Continue with lantus.   Hyperkalemia: Hold ACE.  Received kayexalate.  Resolved. Now hypokalemia. Replete   Dyslipidemia: Continue statins.  Hypothyroidism: Continue Synthroid.  TSH 3.6, normal.   Left-sided pleural effusion: Underwent thoracentesis 1-04. 750 cc L fluid removed.  Iv lasix.  Repeat CXR reveals reaccumulation of the fluid  Hypomagnesemia; replete. Continue to monitor.    DVT prophylaxis: Heparin Code Status: full code.  Family Communication: Daughter  Disposition Plan: Home eventually  Consultants:   Cardiology   IR   Procedures:  Thoracentesis.     Antimicrobials: Off antibiotics  Vancomycin 05-11-2017  Zosyn 05-11-2017   Subjective: She is breathing better, denies chest pain/   Objective: Vitals:   05/17/17 1214 05/17/17 1219 05/17/17 1223 05/17/17 1228  BP: (!) 102/59 (!) 104/59 (!) 106/58 (!) 106/53  Pulse: 74 76 79 71  Resp: (!) 25 (!) 26 (!) 24 (!) 24  Temp:      TempSrc:      SpO2: 98% 99% 100% 100%  Weight:      Height:        Intake/Output Summary (Last 24 hours) at 05/17/2017 1621 Last data filed at 05/17/2017 0852 Gross per 24 hour  Intake -  Output 600 ml  Net -600 ml   Filed Weights   05/15/17 0425 05/16/17 0422 05/17/17 0424  Weight: 60.7 kg (133 lb 14.4 oz) 60.1 kg (132 lb 7.9 oz) 60.1 kg (132 lb 8  oz)    Examination:  General exam: NAD Respiratory system ; Bilateral crackles.  Cardiovascular system: S 1, S 2 RRR Gastrointestinal system: BS present, soft, nt Central nervous system: non focal.  Extremities: symmetric power.  Skin: no rash     Data Reviewed: I have personally reviewed following labs and imaging studies  CBC: Recent Labs  Lab 05/13/17 0257 05/14/17 0312 05/15/17 0351 05/16/17 0551 05/17/17 0500  WBC 11.7* 11.8* 10.1 11.0* 9.5   HGB 11.4* 10.7* 10.4* 10.6* 11.2*  HCT 36.7 35.9* 34.3* 35.2* 36.1  MCV 86.8 86.1 87.1 87.8 87.4  PLT 321 343 311 303 314   Basic Metabolic Panel: Recent Labs  Lab 05/13/17 0257 05/14/17 0312 05/15/17 0351 05/16/17 0551 05/17/17 0500  NA 134* 137 137 135 137  K 3.1* 3.3* 4.3 3.8 3.8  CL 98* 99* 102 95* 97*  CO2 26 28 26 30 31   GLUCOSE 86 155* 109* 129* 148*  BUN 16 17 17 15 18   CREATININE 1.08* 1.07* 1.16* 1.25* 1.14*  CALCIUM 8.3* 8.4* 8.7* 8.7* 9.0  MG  --  1.5*  --  2.1  --    GFR: Estimated Creatinine Clearance: 34.8 mL/min (A) (by C-G formula based on SCr of 1.14 mg/dL (H)). Liver Function Tests: Recent Labs  Lab 05/10/17 2120  AST 40  ALT 15  ALKPHOS 133*  BILITOT 0.6  PROT 7.3  ALBUMIN 3.4*   No results for input(s): LIPASE, AMYLASE in the last 168 hours. No results for input(s): AMMONIA in the last 168 hours. Coagulation Profile: Recent Labs  Lab 05/14/17 1637  INR 1.20   Cardiac Enzymes: Recent Labs  Lab 05/10/17 2120 05/11/17 0242 05/11/17 0423 05/11/17 0936  TROPONINI 8.59* 9.85* 9.41* 7.34*   BNP (last 3 results) No results for input(s): PROBNP in the last 8760 hours. HbA1C: No results for input(s): HGBA1C in the last 72 hours. CBG: Recent Labs  Lab 05/16/17 2003 05/17/17 0018 05/17/17 0423 05/17/17 0847 05/17/17 1334  GLUCAP 159* 119* 166* 121* 109*   Lipid Profile: No results for input(s): CHOL, HDL, LDLCALC, TRIG, CHOLHDL, LDLDIRECT in the last 72 hours. Thyroid Function Tests: No results for input(s): TSH, T4TOTAL, FREET4, T3FREE, THYROIDAB in the last 72 hours. Anemia Panel: No results for input(s): VITAMINB12, FOLATE, FERRITIN, TIBC, IRON, RETICCTPCT in the last 72 hours. Sepsis Labs: Recent Labs  Lab 05/11/17 0242 05/11/17 0423 05/12/17 0709 05/12/17 1140  PROCALCITON  --  <0.10  --   --   LATICACIDVEN 2.5* 3.3* 2.5* 3.2*    Recent Results (from the past 240 hour(s))  Culture, blood (routine x 2)     Status:  None   Collection Time: 05/10/17  9:03 PM  Result Value Ref Range Status   Specimen Description BLOOD RIGHT ANTECUBITAL  Final   Special Requests   Final    BOTTLES DRAWN AEROBIC AND ANAEROBIC Blood Culture adequate volume   Culture NO GROWTH 5 DAYS  Final   Report Status 05/15/2017 FINAL  Final  Culture, blood (routine x 2)     Status: None   Collection Time: 05/10/17  9:13 PM  Result Value Ref Range Status   Specimen Description BLOOD RIGHT WRIST  Final   Special Requests   Final    BOTTLES DRAWN AEROBIC AND ANAEROBIC Blood Culture adequate volume   Culture NO GROWTH 5 DAYS  Final   Report Status 05/15/2017 FINAL  Final  Urine culture     Status: Abnormal   Collection Time: 05/11/17  2:42 AM  Result Value Ref Range Status   Specimen Description URINE, CLEAN CATCH  Final   Special Requests NONE  Final   Culture <10,000 COLONIES/mL (A)  Final   Report Status 05/12/2017 FINAL  Final  Culture, respiratory (NON-Expectorated)     Status: None   Collection Time: 05/11/17  3:50 PM  Result Value Ref Range Status   Specimen Description SPUTUM  Final   Special Requests NONE  Final   Gram Stain   Final    MODERATE WBC PRESENT,BOTH PMN AND MONONUCLEAR FEW SQUAMOUS EPITHELIAL CELLS PRESENT FEW GRAM POSITIVE COCCI IN PAIRS IN CHAINS RARE BUDDING YEAST SEEN    Culture MODERATE CANDIDA GLABRATA  Final   Report Status 05/13/2017 FINAL  Final  MRSA PCR Screening     Status: None   Collection Time: 05/13/17  9:37 AM  Result Value Ref Range Status   MRSA by PCR NEGATIVE NEGATIVE Final    Comment:        The GeneXpert MRSA Assay (FDA approved for NASAL specimens only), is one component of a comprehensive MRSA colonization surveillance program. It is not intended to diagnose MRSA infection nor to guide or monitor treatment for MRSA infections.          Radiology Studies: Dg Chest 2 View  Result Date: 05/17/2017 CLINICAL DATA:  Pleural effusion with shortness of breath. EXAM:  CHEST  2 VIEW COMPARISON:  05/11/2017. FINDINGS: The cardiomediastinal silhouette is enlarged. Prior median sternotomy. Large LEFT pleural effusion is significantly increased. Small RIGHT effusion is improved. Vascular congestion. Retrocardiac opacity could represent fluid, atelectasis, or consolidation. No osseous findings. IMPRESSION: Marked worsening LEFT pleural effusion. Electronically Signed   By: Elsie Stain M.D.   On: 05/17/2017 08:46        Scheduled Meds: . [START ON 05/18/2017] aspirin  81 mg Oral Daily  . carvedilol  3.125 mg Oral BID WC  . [START ON 05/18/2017] clopidogrel  75 mg Oral Q breakfast  . ezetimibe-simvastatin  1 tablet Oral Daily  . insulin aspart  0-9 Units Subcutaneous Q4H  . insulin glargine  6 Units Subcutaneous QHS  . levothyroxine  50 mcg Oral QAC breakfast  . losartan  12.5 mg Oral Daily  . venlafaxine XR  150 mg Oral Daily   Continuous Infusions: . sodium chloride 60 mL/hr at 05/17/17 1245  . sodium chloride    . heparin Stopped (05/17/17 0855)  . nitroGLYCERIN Stopped (05/11/17 1817)     LOS: 6 days    Time spent: 35 minutes.     Barnetta Chapel, MD [facility] Pager [phone]  If 7PM-7AM, please contact night-coverage [email or web address] Password [email or web address] 05/17/2017, 4:21 PM

## 2017-05-17 NOTE — Progress Notes (Signed)
TR BAND REMOVAL  LOCATION:  left radial  DEFLATED PER PROTOCOL:  Yes.    TIME BAND OFF / DRESSING APPLIED:   2000   SITE UPON ARRIVAL:   Level 0  SITE AFTER BAND REMOVAL:  Level 0  CIRCULATION SENSATION AND MOVEMENT:  Within Normal Limits  Yes.    COMMENTS:

## 2017-05-17 NOTE — Progress Notes (Signed)
Called to [id number] for sheath removal assistance. Manual pressure applied to right groin arterial site. Pressure held for 5 minutes , then 346fr venous sheath aspirated and removed. Manual pressure continued on both sites for additional 20 minutes. No hematoma, slight ecchymosis present. Tegaderm dressing applied, bedrest instructions given. Right dp intermittently palpable. Right pt palpable.  Bedrest begins at 17:45:00

## 2017-05-17 NOTE — Interval H&P Note (Signed)
Cath Lab Visit (complete for each Cath Lab visit)  Clinical Evaluation Leading to the Procedure:   ACS: Yes.    Non-ACS:    Anginal Classification: CCS IV  Anti-ischemic medical therapy: Minimal Therapy (1 class of medications)  Non-Invasive Test Results: No non-invasive testing performed  Prior CABG: Previous CABG      History and Physical Interval Note:  05/17/2017 9:21 AM  [name]  has presented today for surgery, with the diagnosis of cad  The various methods of treatment have been discussed with the patient and family. After consideration of risks, benefits and other options for treatment, the patient has consented to  Procedure(s): CORONARY STENT INTERVENTION (N/A) CORONARY ATHERECTOMY (N/A) as a surgical intervention .  The patient's history has been reviewed, patient examined, no change in status, stable for surgery.  I have reviewed the patient's chart and labs.  Questions were answered to the patient's satisfaction.     Nicki Guadalajarahomas Kelly

## 2017-05-17 NOTE — Progress Notes (Signed)
Arterial sheath removed at 1705 with immediate difficulty controlling bleeding.  Blood loss approximately 150 cc.  Appreciate Mr. Robinson's assistance.

## 2017-05-17 NOTE — Progress Notes (Signed)
The patient is just back to the room after undergoing PCI.  I reviewed her coronary angiogram films and she had an excellent result with atherectomy and stenting of her right coronary artery.  She is currently comfortable, lying flat in bed waiting to have her sheath pulled.  Heart is regular rate and rhythm and lung fields are clear.  Tara AshingJim, [name], is at bedside.  We discussed her post procedure fluids.  Will reassess tomorrow morning after labs are drawn.  Currently doing very well after PCI.  [name] 05/17/2017 2:11 PM

## 2017-05-18 ENCOUNTER — Inpatient Hospital Stay (HOSPITAL_COMMUNITY): Payer: Medicare HMO

## 2017-05-18 ENCOUNTER — Telehealth: Payer: Self-pay | Admitting: Pulmonary Disease

## 2017-05-18 ENCOUNTER — Encounter (HOSPITAL_COMMUNITY): Payer: Self-pay | Admitting: Cardiovascular Disease

## 2017-05-18 HISTORY — PX: IR THORACENTESIS ASP PLEURAL SPACE W/IMG GUIDE: IMG5380

## 2017-05-18 LAB — GRAM STAIN

## 2017-05-18 LAB — GLUCOSE, CAPILLARY
GLUCOSE-CAPILLARY: 115 mg/dL — AB (ref 65–99)
Glucose-Capillary: 166 mg/dL — ABNORMAL HIGH (ref 65–99)
Glucose-Capillary: 203 mg/dL — ABNORMAL HIGH (ref 65–99)
Glucose-Capillary: 191 mg/dL — ABNORMAL HIGH (ref 65–99)

## 2017-05-18 LAB — RENAL FUNCTION PANEL
Albumin: 2.6 g/dL — ABNORMAL LOW (ref 3.5–5.0)
Anion gap: 10 (ref 5–15)
BUN: 15 mg/dL (ref 6–20)
CO2: 23 mmol/L (ref 22–32)
Calcium: 8.6 mg/dL — ABNORMAL LOW (ref 8.9–10.3)
Chloride: 102 mmol/L (ref 101–111)
Creatinine, Ser: 0.89 mg/dL (ref 0.44–1.00)
GFR calc Af Amer: >60 mL/min (ref >60)
GFR calc non Af Amer: >60 mL/min (ref >60)
Glucose, Bld: 120 mg/dL — ABNORMAL HIGH (ref 65–99)
Phosphorus: 3 mg/dL (ref 2.5–4.6)
Potassium: 4.1 mmol/L (ref 3.5–5.1)
Sodium: 135 mmol/L (ref 135–145)

## 2017-05-18 LAB — CBC
HEMATOCRIT: 33.7 % — AB (ref 36.0–46.0)
Hemoglobin: 9.9 g/dL — ABNORMAL LOW (ref 12.0–15.0)
MCH: 25.7 pg — AB (ref 26.0–34.0)
MCHC: 29.4 g/dL — ABNORMAL LOW (ref 30.0–36.0)
MCV: 87.5 fL (ref 78.0–100.0)
Platelets: 285 10*3/uL (ref 150–400)
RBC: 3.85 MIL/uL — AB (ref 3.87–5.11)
RDW: 16.3 % — ABNORMAL HIGH (ref 11.5–15.5)
WBC: 10.4 10*3/uL (ref 4.0–10.5)

## 2017-05-18 LAB — LACTATE DEHYDROGENASE, PLEURAL OR PERITONEAL FLUID: LD, Fluid: 108 U/L — ABNORMAL HIGH (ref 3–23)

## 2017-05-18 LAB — BODY FLUID CELL COUNT WITH DIFFERENTIAL
Eos, Fluid: 3 %
Lymphs, Fluid: 86 %
Monocyte-Macrophage-Serous Fluid: 8 % — ABNORMAL LOW (ref 50–90)
Neutrophil Count, Fluid: 3 % (ref 0–25)
Other Cells, Fluid: 0 %
Total Nucleated Cell Count, Fluid: 320 cu mm (ref 0–1000)

## 2017-05-18 LAB — PROTEIN, PLEURAL OR PERITONEAL FLUID: Total protein, fluid: <3 g/dL

## 2017-05-18 MED ORDER — FUROSEMIDE 10 MG/ML IJ SOLN
40.0000 mg | Freq: Two times a day (BID) | INTRAMUSCULAR | Status: DC
  Administered 2017-05-19: 40 mg via INTRAVENOUS
  Filled 2017-05-18: qty 4

## 2017-05-18 MED ORDER — LIDOCAINE HCL 1 % IJ SOLN
INTRAMUSCULAR | Status: DC | PRN
  Administered 2017-05-18: 10 mL

## 2017-05-18 MED ORDER — FUROSEMIDE 40 MG PO TABS
40.0000 mg | ORAL_TABLET | Freq: Every day | ORAL | Status: DC
Start: 2017-05-18 — End: 2017-05-18
  Administered 2017-05-18: 40 mg via ORAL
  Filled 2017-05-18: qty 1

## 2017-05-18 MED ORDER — LIDOCAINE HCL (PF) 1 % IJ SOLN
INTRAMUSCULAR | Status: AC
  Filled 2017-05-18: qty 30

## 2017-05-18 NOTE — Progress Notes (Signed)
PROGRESS NOTE    [name]  ZOX:[id number] DOB: 17-Jan-1946 DOA: 05/10/2017 PCP: Dorisann Frames, MD    Brief Narrative: Patient is a 72 y.o. female with past medical history significant for systolic heart failure (Previous 2D echo showed an EF of 20%), S/P CABG, paroxysmal atrial fibrillation diabetes mellitus type 2 who presents with complaints of chest pain and shortness of breath. Work up revealed NSTEMI. Patient underwent cardiac cath, complex atherectomy and angioplasty of native RCA.   Patient underwent left sided thoracentesis, but CXR post thoracentesis reveals that the right sided pleural effusion is worsening. Last albumin checked was 2.6. Discussed with Cardiology, Dr. Tonny Bollman, will diurese the patient aggressively. Will change lasix from PO to IV. Pleural effusion is likely multifactorial (Low albumin and CHF etc).  Assessment done earlier today indicated that patient's O2 sat dropped to 83% with minimal activity.  Assessment & Plan:   Principal Problem:   Acute respiratory failure with hypoxia (HCC) Active Problems:   CAD- CABG X 4 10/08   Diabetes (HCC)   Hypothyroid   SOB (shortness of breath)   Acute on chronic combined systolic and diastolic congestive heart failure (HCC)   Hyperkalemia   NSTEMI (non-ST elevated myocardial infarction) (HCC)  Acute respiratory failure with hypoxia   /NSTEMI with acute on chronic combined systolic and diastolic congestive heart failure (HCC): S/P left thoracentesis today ( removed) Prior thoracentesis. O.75 L fluid removed. Change PO lasix to IV lasix 40mg  bid and titrate as BP allows. 3 L oxygen.   NSTEMI  Troponin peak to 9.  Cardiology following.  Underwent cath 1-08, right CAD.  Patient underwent complex atherectomy and PCI to RCA on 05/17/17.     SIRS; sepsis, PNA - DOUBT PNEUMONIA (see previous CT Chest). Not on antibiotics. No constitutional symptoms.  Completed antibiotics course.   Diabetes mellitus  type 2 with a last A1c of 10.2: Continue sliding scale insulin.  Hypoglycemia. Treated with D 50%  Hb A1c 8.8 poor controlled.  Continue with lantus.   Hyperkalemia: Resolved. Hold ACE.  Received kayexalate.    Dyslipidemia: Continue statins.  Hypothyroidism: Continue Synthroid.  TSH 3.6, normal.   Left-sided pleural effusion: Underwent left sided thoracentesis today ( drained). IV Lasix.  Repeat CXR reveals reaccumulation of the right sided pleural effusion. Hypoalbuminemia noted.  Hypomagnesemia; replete. Continue to monitor.    DVT prophylaxis: Heparin Code Status: full code.  Family Communication: Daughter  Disposition Plan: Home eventually  Consultants:   Cardiology   IR   Procedures:  Thoracentesis.     Antimicrobials: Off antibiotics  Vancomycin 05-11-2017  Zosyn 05-11-2017   Subjective: She is breathing better, denies chest pain/   Objective: Vitals:   05/18/17 0710 05/18/17 1100 05/18/17 1531 05/18/17 1550  BP: (!) 119/56 (!) 124/56 111/61 (!) 101/50  Pulse: 78 73    Resp: 16 18    Temp: 98.4 F (36.9 C) 98.4 F (36.9 C)    TempSrc: Oral Oral    SpO2: 96% 97%    Weight:      Height:        Intake/Output Summary (Last 24 hours) at 05/18/2017 1820 Last data filed at 05/17/2017 2045 Gross per 24 hour  Intake 480 ml  Output -  Net 480 ml   Filed Weights   05/16/17 0422 05/17/17 0424 05/18/17 0351  Weight: 60.1 kg (132 lb 7.9 oz) 60.1 kg (132 lb 8 oz) 63.6 kg (140 lb 3.4 oz)    Examination:  General exam: NAD  Respiratory system: Decreased air entry posteriorly Cardiovascular system: S 1, S 2 RRR Gastrointestinal system: BS present, soft, nt Central nervous system: non focal.  Extremities: bilateral leg edema 1+   Data Reviewed: I have personally reviewed following labs and imaging studies  CBC: Recent Labs  Lab 05/14/17 0312 05/15/17 0351 05/16/17 0551 05/17/17 0500 05/18/17 0459  WBC 11.8* 10.1 11.0* 9.5  10.4  HGB 10.7* 10.4* 10.6* 11.2* 9.9*  HCT 35.9* 34.3* 35.2* 36.1 33.7*  MCV 86.1 87.1 87.8 87.4 87.5  PLT 343 311 303 314 285   Basic Metabolic Panel: Recent Labs  Lab 05/14/17 0312 05/15/17 0351 05/16/17 0551 05/17/17 0500 05/18/17 0459  NA 137 137 135 137 135  K 3.3* 4.3 3.8 3.8 4.1  CL 99* 102 95* 97* 102  CO2 28 26 30 31 23   GLUCOSE 155* 109* 129* 148* 120*  BUN 17 17 15 18 15   CREATININE 1.07* 1.16* 1.25* 1.14* 0.89  CALCIUM 8.4* 8.7* 8.7* 9.0 8.6*  MG 1.5*  --  2.1  --   --   PHOS  --   --   --   --  3.0   GFR: Estimated Creatinine Clearance: 49.3 mL/min (by C-G formula based on SCr of 0.89 mg/dL). Liver Function Tests: Recent Labs  Lab 05/18/17 0459  ALBUMIN 2.6*   No results for input(s): LIPASE, AMYLASE in the last 168 hours. No results for input(s): AMMONIA in the last 168 hours. Coagulation Profile: Recent Labs  Lab 05/14/17 1637  INR 1.20   Cardiac Enzymes: No results for input(s): CKTOTAL, CKMB, CKMBINDEX, TROPONINI in the last 168 hours. BNP (last 3 results) No results for input(s): PROBNP in the last 8760 hours. HbA1C: No results for input(s): HGBA1C in the last 72 hours. CBG: Recent Labs  Lab 05/17/17 1742 05/17/17 2023 05/18/17 0625 05/18/17 1140 05/18/17 1706  GLUCAP 111* 129* 115* 166* 203*   Lipid Profile: No results for input(s): CHOL, HDL, LDLCALC, TRIG, CHOLHDL, LDLDIRECT in the last 72 hours. Thyroid Function Tests: No results for input(s): TSH, T4TOTAL, FREET4, T3FREE, THYROIDAB in the last 72 hours. Anemia Panel: No results for input(s): VITAMINB12, FOLATE, FERRITIN, TIBC, IRON, RETICCTPCT in the last 72 hours. Sepsis Labs: Recent Labs  Lab 05/12/17 0709 05/12/17 1140  LATICACIDVEN 2.5* 3.2*    Recent Results (from the past 240 hour(s))  Culture, blood (routine x 2)     Status: None   Collection Time: 05/10/17  9:03 PM  Result Value Ref Range Status   Specimen Description BLOOD RIGHT ANTECUBITAL  Final   Special  Requests   Final    BOTTLES DRAWN AEROBIC AND ANAEROBIC Blood Culture adequate volume   Culture NO GROWTH 5 DAYS  Final   Report Status 05/15/2017 FINAL  Final  Culture, blood (routine x 2)     Status: None   Collection Time: 05/10/17  9:13 PM  Result Value Ref Range Status   Specimen Description BLOOD RIGHT WRIST  Final   Special Requests   Final    BOTTLES DRAWN AEROBIC AND ANAEROBIC Blood Culture adequate volume   Culture NO GROWTH 5 DAYS  Final   Report Status 05/15/2017 FINAL  Final  Urine culture     Status: Abnormal   Collection Time: 05/11/17  2:42 AM  Result Value Ref Range Status   Specimen Description URINE, CLEAN CATCH  Final   Special Requests NONE  Final   Culture <10,000 COLONIES/mL (A)  Final   Report Status 05/12/2017 FINAL  Final  Culture, respiratory (NON-Expectorated)     Status: None   Collection Time: 05/11/17  3:50 PM  Result Value Ref Range Status   Specimen Description SPUTUM  Final   Special Requests NONE  Final   Gram Stain   Final    MODERATE WBC PRESENT,BOTH PMN AND MONONUCLEAR FEW SQUAMOUS EPITHELIAL CELLS PRESENT FEW GRAM POSITIVE COCCI IN PAIRS IN CHAINS RARE BUDDING YEAST SEEN    Culture MODERATE CANDIDA GLABRATA  Final   Report Status 05/13/2017 FINAL  Final  MRSA PCR Screening     Status: None   Collection Time: 05/13/17  9:37 AM  Result Value Ref Range Status   MRSA by PCR NEGATIVE NEGATIVE Final    Comment:        The GeneXpert MRSA Assay (FDA approved for NASAL specimens only), is one component of a comprehensive MRSA colonization surveillance program. It is not intended to diagnose MRSA infection nor to guide or monitor treatment for MRSA infections.          Radiology Studies: Dg Chest 1 View  Result Date: 05/18/2017 CLINICAL DATA:  Left thoracentesis today EXAM: CHEST 1 VIEW COMPARISON:  05/17/2017 FINDINGS: Decrease left pleural effusion following thoracentesis. Improved aeration in the left lung base. No pneumothorax  Interval progression of right pleural effusion and right lower lobe airspace disease. Progressive vascular congestion suggestive of heart failure. IMPRESSION: No pneumothorax post left thoracentesis Worsening heart failure with edema. Progression of right pleural effusion. Electronically Signed   By: Marlan Palauharles  Clark M.D.   On: 05/18/2017 16:20   Dg Chest 2 View  Result Date: 05/17/2017 CLINICAL DATA:  Pleural effusion with shortness of breath. EXAM: CHEST  2 VIEW COMPARISON:  05/11/2017. FINDINGS: The cardiomediastinal silhouette is enlarged. Prior median sternotomy. Large LEFT pleural effusion is significantly increased. Small RIGHT effusion is improved. Vascular congestion. Retrocardiac opacity could represent fluid, atelectasis, or consolidation. No osseous findings. IMPRESSION: Marked worsening LEFT pleural effusion. Electronically Signed   By: Elsie StainJohn T Curnes M.D.   On: 05/17/2017 08:46   Ir Thoracentesis Asp Pleural Space W/img Guide  Result Date: 05/18/2017 INDICATION: Acute respiratory failure secondary to pleural effusions. NSTEMI with acute on chronic systolic and diastolic congestive heart failure. Request for therapeutic left thoracentesis. EXAM: ULTRASOUND GUIDED LEFT THORACENTESIS MEDICATIONS: 1% Lidocaine = 10 mL COMPLICATIONS: None immediate. PROCEDURE: An ultrasound guided thoracentesis was thoroughly discussed with the patient and questions answered. The benefits, risks, alternatives and complications were also discussed. The patient understands and wishes to proceed with the procedure. Written consent was obtained. Ultrasound was performed to localize and mark an adequate pocket of fluid in the left chest. The area was then prepped and draped in the normal sterile fashion. 1% Lidocaine was used for local anesthesia. Under ultrasound guidance a 6 Fr Safe-T-Centesis catheter was introduced. Thoracentesis was performed. The catheter was removed and a dressing applied. FINDINGS: A total of  approximately 870 mL of clear gold fluid was removed. IMPRESSION: Successful ultrasound guided left thoracentesis yielding 870 mL of pleural fluid. No pneumothorax on post procedure chest X-ray. Read by: Corrin ParkerWendy Blair, PA-C Electronically Signed   By: Malachy MoanHeath  McCullough M.D.   On: 05/18/2017 16:23        Scheduled Meds: . aspirin  81 mg Oral Daily  . carvedilol  3.125 mg Oral BID WC  . clopidogrel  75 mg Oral Q breakfast  . ezetimibe-simvastatin  1 tablet Oral Daily  . furosemide  40 mg Intravenous BID  . insulin aspart  0-9 Units Subcutaneous  TID WC  . insulin glargine  6 Units Subcutaneous QHS  . levothyroxine  50 mcg Oral QAC breakfast  . lidocaine (PF)      . losartan  12.5 mg Oral Daily  . venlafaxine XR  150 mg Oral Daily   Continuous Infusions: . sodium chloride       LOS: 7 days    Time spent: 35 minutes.     Barnetta Chapel, MD [facility] Pager [phone]  If 7PM-7AM, please contact night-coverage [email or web address] Password [email or web address] 05/18/2017, 6:20 PM

## 2017-05-18 NOTE — Care Management Note (Addendum)
Case Management Note  Patient Details  Name: Michaelyn Barterolly K Wehrman MRN: [id number] Date of Birth: [birth year]  Subjective/Objective:   From home with spouse, she uses a cane at home and when she goes out she uses her walker, she has home oyxgen (2liters -continously) she could not remember the name of provider, but her spouse will bring the tank her before she is discharged.  She is s/p coronary stent intervention, will be on plavix.  She has a PCP and medication coverage.  Presents with NSTEMI,  Acute on chronic chf, large left pl effusion, pna.   She is s/p thorancentesis today.                 Action/Plan: NCM will follow for dc needs.   Expected Discharge Date:                  Expected Discharge Plan:  Home w Home Health Services  In-House Referral:     Discharge planning Services  CM Consult  Post Acute Care Choice:    Choice offered to:     DME Arranged:    DME Agency:     HH Arranged:    HH Agency:     Status of Service:  In process, will continue to follow  If discussed at Long Length of Stay Meetings, dates discussed:    Additional Comments:  Leone Havenaylor, Zolton Dowson Clinton, RN 05/18/2017, 11:12 AM

## 2017-05-18 NOTE — Telephone Encounter (Signed)
I have updated apt note for reminder.  Will close encounter.

## 2017-05-18 NOTE — Progress Notes (Signed)
CARDIAC REHAB PHASE I   PRE:  Rate/Rhythm: 86 SR  BP:  Supine:   Sitting: 124/54  Standing:    SaO2: 97% 4L  95% 2L  MODE:  Ambulation: 75 ft   POST:  Rate/Rhythm: 87 SR  BP:  Supine:   Sitting: 128/62  Standing:    SaO2: 83%RA   95% 3L 0855-1017 Tried pt off oxygen just to go to bathroom and sats to 83%. Put on 3L and sats up quickly. Walked pt on 3L and monitored sats and 95%.. Pt walked 75 ft on 3L with rolling walker and steady gait. Tired easily. Pt stated she has only been on oxygen for about a week. To recliner after walk. Some SOB after walk that resolved. No CP. MI education completed with pt who voiced understanding. Stressed importance of plavix with stent. Reviewed MI restrictions, NTG use, and walking as tolerated for ex. Discussed CRP 2 and will refer to [place]. Pt will consider. Gave pt CHF booklet and discussed the importance of daily weights and 2000 mg sodium restriction. Discussed zones and when to call MD with symptoms.  Luetta Nuttingharlene Olevia Westervelt, RN BSN  05/18/2017 10:10 AM

## 2017-05-18 NOTE — Progress Notes (Signed)
SATURATION QUALIFICATIONS: (This note is used to comply with regulatory documentation for home oxygen)  Patient Saturations on Room Air at Rest = 90%  sitting  Patient Saturations on Room Air while Ambulating = 83%    Just walked to bathroom  Patient Saturations on 3 Liters of oxygen while Ambulating = 95%  Please briefly explain why patient needs home oxygen:  Desat with very little activity
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
## 2017-05-18 NOTE — Telephone Encounter (Signed)
Spoke with Morrie SheldonAshley, she was set up with oxygen with [facility]. Now she needs a qualifying walk. She has an appt with PM on 1/23 so we can do this when she comes into the office. Morrie Sheldonshley just wanted to give us a heads up on this. FYI Margie

## 2017-05-18 NOTE — Procedures (Signed)
PROCEDURE SUMMARY:  Successful US guided left thoracentesis. Yielded 870 mL of clear gold fluid. Pt tolerated procedure well. No immediate complications.  Specimen was sent for labs.  Post procedure chest X-ray reveals no pneumothorax  Keltie Labell S Patric Buckhalter PA-C 05/18/2017 4:24 PM

## 2017-05-19 LAB — BASIC METABOLIC PANEL
ANION GAP: 8 (ref 5–15)
BUN: 12 mg/dL (ref 6–20)
CO2: 28 mmol/L (ref 22–32)
CREATININE: 0.83 mg/dL (ref 0.44–1.00)
Calcium: 8.8 mg/dL — ABNORMAL LOW (ref 8.9–10.3)
Chloride: 101 mmol/L (ref 101–111)
GFR calc Af Amer: >60 mL/min (ref >60)
GLUCOSE: 142 mg/dL — AB (ref 65–99)
Potassium: 4.4 mmol/L (ref 3.5–5.1)
Sodium: 137 mmol/L (ref 135–145)

## 2017-05-19 LAB — GLUCOSE, CAPILLARY
GLUCOSE-CAPILLARY: 147 mg/dL — AB (ref 65–99)
GLUCOSE-CAPILLARY: 216 mg/dL — AB (ref 65–99)
Glucose-Capillary: 160 mg/dL — ABNORMAL HIGH (ref 65–99)
Glucose-Capillary: 208 mg/dL — ABNORMAL HIGH (ref 65–99)

## 2017-05-19 MED ORDER — FUROSEMIDE 10 MG/ML IJ SOLN
80.0000 mg | Freq: Two times a day (BID) | INTRAMUSCULAR | Status: DC
  Administered 2017-05-19 – 2017-05-20 (×2): 80 mg via INTRAVENOUS
  Filled 2017-05-19 (×2): qty 8

## 2017-05-19 MED ORDER — SPIRONOLACTONE 12.5 MG HALF TABLET
12.5000 mg | ORAL_TABLET | Freq: Every day | ORAL | Status: DC
  Administered 2017-05-19 – 2017-05-20 (×2): 12.5 mg via ORAL
  Filled 2017-05-19 (×3): qty 1

## 2017-05-19 NOTE — Progress Notes (Signed)
CARDIAC REHAB PHASE I   PRE:  Rate/Rhythm: 73  BP:  Sitting: 97/47     SaO2: 90 ra  MODE:  Ambulation: 250 ft   POST:  Rate/Rhythm: 88  BP:  Sitting: 110/52     SaO2: 93 3liters  2:39p-3:05p Patient ambulated with a walker slow and steady. Patient stated that her only complaint is that she has leg fatigue. Patient placed in chair with call bell in reach.  Barnabas ListerMolly M Adley Mazurowski, MS 05/19/2017 2:59 PM

## 2017-05-19 NOTE — Progress Notes (Signed)
PROGRESS NOTE    [name]  XBJ:[id number] DOB: July 08, 1945 DOA: 05/10/2017 PCP: Dorisann FramesBalan, Bindubal, MD    Brief Narrative: Patient is a 72 y.o. female with past medical history significant for systolic heart failure (Previous 2D echo showed an EF of 20%), S/P CABG, paroxysmal atrial fibrillation diabetes mellitus type 2 who presents with complaints of chest pain and shortness of breath. Work up revealed NSTEMI. Patient underwent cardiac cath, complex atherectomy and angioplasty of native RCA.   Patient underwent left sided thoracentesis on 05/18/17, but CXR post thoracentesis revealed that the right sided pleural effusion was worsening. Last albumin checked was 2.6. Patient currently being aggressively diuresed. Patient's edema is resolving. SOB is also improving but patient is not back to baseline. Patient is currently on IV Lasix 80mg  Bid. Will consult PT. Continue to monitor volume status and electrolytes.    Assessment & Plan:   Principal Problem:   Acute respiratory failure with hypoxia (HCC) Active Problems:   CAD- CABG X 4 10/08   Diabetes (HCC)   Hypothyroid   SOB (shortness of breath)   Acute on chronic combined systolic and diastolic congestive heart failure (HCC)   Hyperkalemia   NSTEMI (non-ST elevated myocardial infarction) (HCC)  Acute respiratory failure with hypoxia   Acute on chronic combined systolic and diastolic congestive heart failure (HCC): S/P left thoracentesis today (870mls removed) Prior thoracentesis. O.75 L fluid removed. IV Lasix 80mg  Bid.   NSTEMI  Troponin peaked at 9.  Underwent cath 1-08, right CAD.  Patient underwent complex atherectomy and PCI to RCA on 05/17/17.     SIRS; sepsis, PNA - DOUBT PNEUMONIA (see previous CT Chest). Not on antibiotics. No constitutional symptoms.  Completed antibiotics course.   Diabetes mellitus type 2 with a last A1c of 10.2: Continue sliding scale insulin.  Hypoglycemia. Treated with D 50%  Hb A1c 8.8 poor  controlled.  Continue with lantus.   Hyperkalemia: Resolved. Hold ACE.  Received kayexalate.    Dyslipidemia: Continue statins.  Hypothyroidism: Continue Synthroid.  TSH 3.6, normal.   Left-sided pleural effusion: Underwent left sided thoracentesis today (870mls drained). IV Lasix.  Repeat CXR reveals reaccumulation of the right sided pleural effusion. Hypoalbuminemia noted.  Hypomagnesemia; replete. Continue to monitor.    DVT prophylaxis: Heparin Code Status: full code.  Family Communication:   Disposition Plan: PT consulted.  Consultants:   Cardiology   IR   Procedures:  Thoracentesis.   Cardiac catheterization, complex atherectomy and PCI to RCA    Antimicrobials: Off antibiotics  Vancomycin 05-11-2017  Zosyn 05-11-2017   Subjective: Patient is breathing better but not at baseline.   Objective: Vitals:   05/18/17 2124 05/19/17 0532 05/19/17 0905 05/19/17 1712  BP: (!) 110/56 (!) 110/58 (!) 110/54 110/60  Pulse: 76 77 80 80  Resp: 18 18 16    Temp: 98.6 F (37 C) 98.2 F (36.8 C) 97.8 F (36.6 C)   TempSrc: Oral Oral Oral   SpO2: 97% 94% 96%   Weight:  59.6 kg (131 lb 8 oz)    Height:        Intake/Output Summary (Last 24 hours) at 05/19/2017 1901 Last data filed at 05/19/2017 1830 Gross per 24 hour  Intake 942 ml  Output 1000 ml  Net -58 ml   Filed Weights   05/18/17 0351 05/18/17 1853 05/19/17 0532  Weight: 63.6 kg (140 lb 3.4 oz) 60.6 kg (133 lb 8 oz) 59.6 kg (131 lb 8 oz)    Examination:  General exam: NAD  Respiratory system: Decreased air entry posteriorly Cardiovascular system: S 1, S 2 RRR Gastrointestinal system: BS present, soft, nt Central nervous system: non focal.  Extremities: bilateral leg edema 1+, improving   Data Reviewed: I have personally reviewed following labs and imaging studies  CBC: Recent Labs  Lab 05/14/17 0312 05/15/17 0351 05/16/17 0551 05/17/17 0500 05/18/17 0459  WBC 11.8* 10.1 11.0*  9.5 10.4  HGB 10.7* 10.4* 10.6* 11.2* 9.9*  HCT 35.9* 34.3* 35.2* 36.1 33.7*  MCV 86.1 87.1 87.8 87.4 87.5  PLT 343 311 303 314 285   Basic Metabolic Panel: Recent Labs  Lab 05/14/17 0312 05/15/17 0351 05/16/17 0551 05/17/17 0500 05/18/17 0459 05/19/17 1432  NA 137 137 135 137 135 137  K 3.3* 4.3 3.8 3.8 4.1 4.4  CL 99* 102 95* 97* 102 101  CO2 28 26 30 31 23 28   GLUCOSE 155* 109* 129* 148* 120* 142*  BUN 17 17 15 18 15 12   CREATININE 1.07* 1.16* 1.25* 1.14* 0.89 0.83  CALCIUM 8.4* 8.7* 8.7* 9.0 8.6* 8.8*  MG 1.5*  --  2.1  --   --   --   PHOS  --   --   --   --  3.0  --    GFR: Estimated Creatinine Clearance: 47.7 mL/min (by C-G formula based on SCr of 0.83 mg/dL). Liver Function Tests: Recent Labs  Lab 05/18/17 0459  ALBUMIN 2.6*   No results for input(s): LIPASE, AMYLASE in the last 168 hours. No results for input(s): AMMONIA in the last 168 hours. Coagulation Profile: Recent Labs  Lab 05/14/17 1637  INR 1.20   Cardiac Enzymes: No results for input(s): CKTOTAL, CKMB, CKMBINDEX, TROPONINI in the last 168 hours. BNP (last 3 results) No results for input(s): PROBNP in the last 8760 hours. HbA1C: No results for input(s): HGBA1C in the last 72 hours. CBG: Recent Labs  Lab 05/18/17 1706 05/18/17 2127 05/19/17 0751 05/19/17 1120 05/19/17 1513  GLUCAP 203* 191* 147* 216* 160*   Lipid Profile: No results for input(s): CHOL, HDL, LDLCALC, TRIG, CHOLHDL, LDLDIRECT in the last 72 hours. Thyroid Function Tests: No results for input(s): TSH, T4TOTAL, FREET4, T3FREE, THYROIDAB in the last 72 hours. Anemia Panel: No results for input(s): VITAMINB12, FOLATE, FERRITIN, TIBC, IRON, RETICCTPCT in the last 72 hours. Sepsis Labs: No results for input(s): PROCALCITON, LATICACIDVEN in the last 168 hours.  Recent Results (from the past 240 hour(s))  Culture, blood (routine x 2)     Status: None   Collection Time: 05/10/17  9:03 PM  Result Value Ref Range Status    Specimen Description BLOOD RIGHT ANTECUBITAL  Final   Special Requests   Final    BOTTLES DRAWN AEROBIC AND ANAEROBIC Blood Culture adequate volume   Culture NO GROWTH 5 DAYS  Final   Report Status 05/15/2017 FINAL  Final  Culture, blood (routine x 2)     Status: None   Collection Time: 05/10/17  9:13 PM  Result Value Ref Range Status   Specimen Description BLOOD RIGHT WRIST  Final   Special Requests   Final    BOTTLES DRAWN AEROBIC AND ANAEROBIC Blood Culture adequate volume   Culture NO GROWTH 5 DAYS  Final   Report Status 05/15/2017 FINAL  Final  Urine culture     Status: Abnormal   Collection Time: 05/11/17  2:42 AM  Result Value Ref Range Status   Specimen Description URINE, CLEAN CATCH  Final   Special Requests NONE  Final  Culture <10,000 COLONIES/mL (A)  Final   Report Status 05/12/2017 FINAL  Final  Culture, respiratory (NON-Expectorated)     Status: None   Collection Time: 05/11/17  3:50 PM  Result Value Ref Range Status   Specimen Description SPUTUM  Final   Special Requests NONE  Final   Gram Stain   Final    MODERATE WBC PRESENT,BOTH PMN AND MONONUCLEAR FEW SQUAMOUS EPITHELIAL CELLS PRESENT FEW GRAM POSITIVE COCCI IN PAIRS IN CHAINS RARE BUDDING YEAST SEEN    Culture MODERATE CANDIDA GLABRATA  Final   Report Status 05/13/2017 FINAL  Final  MRSA PCR Screening     Status: None   Collection Time: 05/13/17  9:37 AM  Result Value Ref Range Status   MRSA by PCR NEGATIVE NEGATIVE Final    Comment:        The GeneXpert MRSA Assay (FDA approved for NASAL specimens only), is one component of a comprehensive MRSA colonization surveillance program. It is not intended to diagnose MRSA infection nor to guide or monitor treatment for MRSA infections.   Gram stain     Status: None   Collection Time: 05/18/17  3:52 PM  Result Value Ref Range Status   Specimen Description PLEURAL LEFT  Final   Special Requests NONE  Final   Gram Stain   Final    RARE WBC PRESENT,  PREDOMINANTLY MONONUCLEAR NO ORGANISMS SEEN    Report Status 05/18/2017 FINAL  Final  Culture, body fluid-bottle     Status: None (Preliminary result)   Collection Time: 05/18/17  3:52 PM  Result Value Ref Range Status   Specimen Description PLEURAL LEFT  Final   Special Requests NONE  Final   Culture NO GROWTH < 24 HOURS  Final   Report Status PENDING  Incomplete         Radiology Studies: Dg Chest 1 View  Result Date: 05/18/2017 CLINICAL DATA:  Left thoracentesis today EXAM: CHEST 1 VIEW COMPARISON:  05/17/2017 FINDINGS: Decrease left pleural effusion following thoracentesis. Improved aeration in the left lung base. No pneumothorax Interval progression of right pleural effusion and right lower lobe airspace disease. Progressive vascular congestion suggestive of heart failure. IMPRESSION: No pneumothorax post left thoracentesis Worsening heart failure with edema. Progression of right pleural effusion. Electronically Signed   By: Marlan Palau M.D.   On: 05/18/2017 16:20   Ir Thoracentesis Asp Pleural Space W/img Guide  Result Date: 05/18/2017 INDICATION: Acute respiratory failure secondary to pleural effusions. NSTEMI with acute on chronic systolic and diastolic congestive heart failure. Request for therapeutic left thoracentesis. EXAM: ULTRASOUND GUIDED LEFT THORACENTESIS MEDICATIONS: 1% Lidocaine = 10 mL COMPLICATIONS: None immediate. PROCEDURE: An ultrasound guided thoracentesis was thoroughly discussed with the patient and questions answered. The benefits, risks, alternatives and complications were also discussed. The patient understands and wishes to proceed with the procedure. Written consent was obtained. Ultrasound was performed to localize and mark an adequate pocket of fluid in the left chest. The area was then prepped and draped in the normal sterile fashion. 1% Lidocaine was used for local anesthesia. Under ultrasound guidance a 6 Fr Safe-T-Centesis catheter was introduced.  Thoracentesis was performed. The catheter was removed and a dressing applied. FINDINGS: A total of approximately 870 mL of clear gold fluid was removed. IMPRESSION: Successful ultrasound guided left thoracentesis yielding 870 mL of pleural fluid. No pneumothorax on post procedure chest X-ray. Read by: Corrin Parker, PA-C Electronically Signed   By: Malachy Moan M.D.   On: 05/18/2017 16:23  Scheduled Meds: . aspirin  81 mg Oral Daily  . carvedilol  3.125 mg Oral BID WC  . clopidogrel  75 mg Oral Q breakfast  . ezetimibe-simvastatin  1 tablet Oral Daily  . furosemide  80 mg Intravenous BID  . insulin aspart  0-9 Units Subcutaneous TID WC  . insulin glargine  6 Units Subcutaneous QHS  . levothyroxine  50 mcg Oral QAC breakfast  . losartan  12.5 mg Oral Daily  . spironolactone  12.5 mg Oral Daily  . venlafaxine XR  150 mg Oral Daily   Continuous Infusions: . sodium chloride       LOS: 8 days    Time spent: 35 minutes.     Barnetta Chapel, MD [facility] Pager [phone]  If 7PM-7AM, please contact night-coverage [email or web address] Password [email or web address] 05/19/2017, 7:01 PM

## 2017-05-19 NOTE — Plan of Care (Signed)
  Pain Managment: General experience of comfort will improve 05/19/2017 0444 - Completed/Met by Evert Kohl, RN   Skin Integrity: Risk for impaired skin integrity will decrease 05/19/2017 0444 - Completed/Met by Evert Kohl, RN   Safety: Ability to remain free from injury will improve 05/19/2017 0444 - Completed/Met by Garnett-Mellinger, Sophronia Simas, RN

## 2017-05-19 NOTE — Plan of Care (Signed)
  Pain Managment: General experience of comfort will improve 05/19/2017 0444 - Completed/Met by Evert Kohl, RN   Safety: Ability to remain free from injury will improve 05/19/2017 0444 - Completed/Met by Evert Kohl, RN   Skin Integrity: Risk for impaired skin integrity will decrease 05/19/2017 0444 - Completed/Met by Evert Kohl, RN

## 2017-05-19 NOTE — Progress Notes (Signed)
PT Cancellation Note  Patient Details Name: [name] K Alpert MRN: [id number] DOB: June 14, 1945   Cancelled Treatment:    Reason Eval/Treat Not Completed: Other (comment), patient just ambulated with cardiac rehab 26250ft . Will hold PT eval at this time and follow up next day.   Fabio AsaDevon J Dianna Ewald 05/19/2017, 3:12 PM Charlotte Crumbevon Albie Arizpe, PT DPT  Board Certified Neurologic Specialist [phone]

## 2017-05-19 NOTE — Progress Notes (Signed)
Pt is stable, vitals stable, ambulated in a hallway without distress and pain, diuresing moderate, no any complain of pain, will continue to monitor the patient
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
## 2017-05-20 DIAGNOSIS — J90 Pleural effusion, not elsewhere classified: Secondary | ICD-10-CM

## 2017-05-20 DIAGNOSIS — Z9889 Other specified postprocedural states: Secondary | ICD-10-CM

## 2017-05-20 DIAGNOSIS — Z955 Presence of coronary angioplasty implant and graft: Secondary | ICD-10-CM

## 2017-05-20 DIAGNOSIS — I25708 Atherosclerosis of coronary artery bypass graft(s), unspecified, with other forms of angina pectoris: Secondary | ICD-10-CM

## 2017-05-20 LAB — PH, BODY FLUID: pH, Body Fluid: 7.6

## 2017-05-20 LAB — GLUCOSE, CAPILLARY
Glucose-Capillary: 177 mg/dL — ABNORMAL HIGH (ref 65–99)
Glucose-Capillary: 166 mg/dL — ABNORMAL HIGH (ref 65–99)

## 2017-05-20 LAB — BASIC METABOLIC PANEL
Anion gap: 11 (ref 5–15)
BUN: 12 mg/dL (ref 6–20)
CHLORIDE: 100 mmol/L — AB (ref 101–111)
CO2: 24 mmol/L (ref 22–32)
Calcium: 8.6 mg/dL — ABNORMAL LOW (ref 8.9–10.3)
Creatinine, Ser: 0.88 mg/dL (ref 0.44–1.00)
GFR calc Af Amer: >60 mL/min (ref >60)
GFR calc non Af Amer: >60 mL/min (ref >60)
Glucose, Bld: 199 mg/dL — ABNORMAL HIGH (ref 65–99)
POTASSIUM: 3.9 mmol/L (ref 3.5–5.1)
SODIUM: 135 mmol/L (ref 135–145)

## 2017-05-20 MED ORDER — INSULIN GLARGINE 100 UNIT/ML ~~LOC~~ SOLN: 6.0000 [IU] | Freq: Every day | SUBCUTANEOUS | 11 refills | Status: DC

## 2017-05-20 MED ORDER — ASPIRIN 81 MG PO CHEW: 81.0000 mg | CHEWABLE_TABLET | Freq: Every day | ORAL | 1 refills | Status: DC

## 2017-05-20 MED ORDER — LOSARTAN POTASSIUM 25 MG PO TABS: 12.5000 mg | ORAL_TABLET | Freq: Every day | ORAL | 0 refills | Status: DC

## 2017-05-20 MED ORDER — CLOPIDOGREL BISULFATE 75 MG PO TABS: 75.0000 mg | ORAL_TABLET | Freq: Every day | ORAL | 1 refills | Status: DC

## 2017-05-20 MED ORDER — SPIRONOLACTONE 25 MG PO TABS: 12.5000 mg | ORAL_TABLET | Freq: Every day | ORAL | 1 refills | Status: DC

## 2017-05-20 MED ORDER — TORSEMIDE 20 MG PO TABS: 40.0000 mg | ORAL_TABLET | Freq: Every day | ORAL | 0 refills | Status: DC

## 2017-05-20 NOTE — Progress Notes (Signed)
Pt got discharged to home, discharge instructions provided and patient showed understanding to it, IV taken out,Telemonitor DC,pt left unit in wheelchair with all of the belongings accompanied with a family member (Daughter) 

## 2017-05-20 NOTE — Evaluation (Signed)
Physical Therapy Evaluation Patient Details Name: [name] MRN: [id number] DOB: May 15, 1945 Today's Date: 05/20/2017   History of Present Illness  72 y.o. female with past medical history significant for systolic heart failure (Previous 2D echo showed an EF of 20%), S/P CABG, paroxysmal atrial fibrillation diabetes mellitus type 2 who presents with complaints of chest pain and shortness of breath  Clinical Impression  Orders received for PT evaluation. Patient demonstrates deficits in functional mobility as indicated below. Will benefit from continued skilled PT to address deficits and maximize function. Will see as indicated and progress as tolerated.  OF NOTE: ambualted on room air, HR 80s Saturations dropped to 87% on room air.    Follow Up Recommendations No PT follow up;Supervision for mobility/OOB    Equipment Recommendations  None recommended by PT    Recommendations for Other Services       Precautions / Restrictions Precautions Precautions: Fall Precaution Comments: watch o2 saturations Restrictions Weight Bearing Restrictions: Yes LUE Weight Bearing: Non weight bearing      Mobility  Bed Mobility Overal bed mobility: Modified Independent             General bed mobility comments: increased time and effort to perform  Transfers Overall transfer level: Needs assistance   Transfers: Sit to/from Stand Sit to Stand: Supervision         General transfer comment: supervision for safety, no physical assist required  Ambulation/Gait Ambulation/Gait assistance: Supervision Ambulation Distance (Feet): 120 Feet Assistive device: Rolling walker (2 wheeled) Gait Pattern/deviations: Decreased stride length;Narrow base of support;Drifts right/left Gait velocity: decreased Gait velocity interpretation: Below normal speed for age/gender General Gait Details: patient slow and cautious with ambulation, ambulated on room air, saturations 87% but no evidence of DOE  noted.  Stairs            Wheelchair Mobility    Modified Rankin (Stroke Patients Only)       Balance Overall balance assessment: Needs assistance   Sitting balance-Leahy Scale: Good     Standing balance support: Bilateral upper extremity supported Standing balance-Leahy Scale: Fair                               Pertinent Vitals/Pain      Home Living Family/patient expects to be discharged to:: Private residence Living Arrangements: Spouse/significant other Available Help at Discharge: Family;Available PRN/intermittently Type of Home: House Home Access: Stairs to enter Entrance Stairs-Rails: Can reach both Entrance Stairs-Number of Steps: 3 Home Layout: One level Home Equipment: Shower seat Additional Comments: Pt provides supervision to spouse who has cognitive deficits     Prior Function Level of Independence: Independent               Hand Dominance   Dominant Hand: Right    Extremity/Trunk Assessment   Upper Extremity Assessment Upper Extremity Assessment: Overall WFL for tasks assessed    Lower Extremity Assessment Lower Extremity Assessment: Overall WFL for tasks assessed       Communication   Communication: No difficulties  Cognition Arousal/Alertness: Awake/alert Behavior During Therapy: WFL for tasks assessed/performed Overall Cognitive Status: Within Functional Limits for tasks assessed                                        General Comments      Exercises  Assessment/Plan    PT Assessment Patient needs continued PT services  PT Problem List Decreased strength;Decreased activity tolerance;Decreased balance;Decreased mobility;Cardiopulmonary status limiting activity       PT Treatment Interventions DME instruction;Gait training;Stair training;Functional mobility training;Therapeutic activities;Therapeutic exercise;Balance training;Patient/family education    PT Goals (Current goals can be  found in the Care Plan section)  Acute Rehab PT Goals Patient Stated Goal: to go home PT Goal Formulation: With patient Time For Goal Achievement: 06/03/17 Potential to Achieve Goals: Good    Frequency Min 3X/week   Barriers to discharge        Co-evaluation               AM-PAC PT "6 Clicks" Daily Activity  Outcome Measure Difficulty turning over in bed (including adjusting bedclothes, sheets and blankets)?: A Little Difficulty moving from lying on back to sitting on the side of the bed? : A Little Difficulty sitting down on and standing up from a chair with arms (e.g., wheelchair, bedside commode, etc,.)?: A Little Help needed moving to and from a bed to chair (including a wheelchair)?: A Little Help needed walking in hospital room?: A Little Help needed climbing 3-5 steps with a railing? : A Lot 6 Click Score: 17    End of Session Equipment Utilized During Treatment: Gait belt Activity Tolerance: Patient tolerated treatment well Patient left: in chair;with call bell/phone within reach Nurse Communication: Mobility status PT Visit Diagnosis: Difficulty in walking, not elsewhere classified (R26.2)    Time: [id number] PT Time Calculation (min) (ACUTE ONLY): 22 min   Charges:   PT Evaluation $PT Eval Moderate Complexity: 1 Mod     PT G Codes:        Charlotte Crumbevon Casy Tavano, PT DPT  Board Certified Neurologic Specialist [phone]   Fabio AsaDevon J Abdiaziz Klahn 05/20/2017, 9:54 AM

## 2017-05-20 NOTE — Plan of Care (Signed)
  Education: Understanding of CV disease, CV risk reduction, and recovery process will improve 05/20/2017 0539 - Completed/Met by Evert Kohl, RN
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
## 2017-05-20 NOTE — Plan of Care (Signed)
  Education: Understanding of CV disease, CV risk reduction, and recovery process will improve 05/20/2017 0539 - Completed/Met by Garnett-Mellinger, Maverik Foot M, RN   

## 2017-05-20 NOTE — Discharge Summary (Signed)
Physician Discharge Summary  Patient ID: [name] MRN: [id number] DOB/AGE: [birth year] 72 y.o.  Admit date: 05/10/2017 Discharge date: 05/20/2017  Admission Diagnoses:  Discharge Diagnoses:    NSTEMI (non-ST elevated myocardial infarction) (HCC)   Acute on chronic combined systolic and diastolic congestive heart failure (HCC)   Acute respiratory failure with hypoxia (HCC)   Recurrent bilateral pleural effusion s/p repeat thoracentesis   Hypoalbuminemia   CAD- CABG X 4 10/08   Diabetes (HCC)   Hypothyroid   SOB (shortness of breath)    Hyperkalemia      Discharged Condition: stable  Hospital Course: Patient is a 72 year old Caucasianfemalewith past medical history significant for systolic heart failure (Previous 2D echo showed an EF of 20%), S/P CABG, paroxysmal atrial fibrillation, and diabetes mellitus type 2. Patient was admitted with chest pain and shortness of breath. Work up revealed NSTEMI. Patient underwent cardiac cath, the first being diagnostic and the second being for complex atherectomy and angioplasty of native RCA. Patient was noted to be significantly short of breath. Imaging studies done revealed significant pleural effusion, worse on the left. Patient underwent repeat left sided thoracentesis, the first being on 05/11/17 and the second on 05/18/17. Patient has significantly low albumin as well, the last albumin level noted to be 2.6. Patient was aggressively diuresed, and will be discharged on Torsemide 40mg  po once daily. Patient will follow up with PCP and Cardiology within 1 week and the diuretics management will be adjusted accordingly. Patient will need O2 supplementation on discharge. Patient's O2 saturation dropped to 83% with minimal activity, though, the supplemental oxygen requirement continued to decreased with diuresis and patient was able to tolerated more exercise.    Consults: cardiology  Significant Diagnostic Studies: Cardiac catheterization.      ECHO:  Systolic function was severely reduced. The   estimated ejection fraction was in the range of 20% to 25%.   Diffuse hypokinesis. Doppler parameters are consistent with   restrictive physiology, indicative of decreased left ventricular   diastolic compliance and/or increased left atrial pressure.   Doppler parameters are consistent with high ventricular filling   pressure. - Aortic valve: Mildly calcified annulus. Trileaflet; mildly   thickened leaflets. Valve area (VTI): 1.45 cm^2. Valve area   (Vmax): 1.22 cm^2. Valve area (Vmean): 1.31 cm^2. - Mitral valve: Mildly calcified annulus. Mildly thickened leaflets   . There was moderate regurgitation. The MR vena contracta is 0.5   cm. - Left atrium: The atrium was severely dilated. - Right ventricle: The cavity size was mildly dilated. - Right atrium: The atrium was mildly to moderately dilated. - Pericardium, extracardiac: Large left pleural effusion.  Treatments: Complex atherectomy and PCI to right RCA.   Repeat Thoracentesis  Discharge Exam: Blood pressure 100/60, pulse 80, temperature 98.4 F (36.9 C), temperature source Oral, resp. rate 18, height 5\' 2"  (1.575 m), weight 59.1 kg (130 lb 6.4 oz), SpO2 97 %.   Disposition: 03-Skilled Nursing Facility  Discharge Instructions    Amb Referral to Cardiac Rehabilitation   Complete by:  As directed    Diagnosis:   NSTEMI Coronary Stents     Call MD for:   Complete by:  As directed    Call MD with worsening of symptoms   Diet - low sodium heart healthy   Complete by:  As directed    Increase activity slowly   Complete by:  As directed      Allergies as of 05/20/2017      Reactions  Crestor [rosuvastatin] Other (See Comments)   unknown   Lipitor [atorvastatin] Other (See Comments)   Very bad pain in hips and joints      Medication List    STOP taking these medications   acetaminophen 325 MG tablet Commonly known as:  TYLENOL   amoxicillin-clavulanate 1000-62.5 MG 12  hr tablet Commonly known as:  AUGMENTIN XR   aspirin 325 MG EC tablet Replaced by:  aspirin 81 MG chewable tablet   BIOTIN FORTE PO   furosemide 40 MG tablet Commonly known as:  LASIX   HYDROcodone-acetaminophen 5-325 MG tablet Commonly known as:  NORCO/VICODIN   Krill Oil 300 MG Caps   mouth rinse Liqd solution   mupirocin ointment 2 % Commonly known as:  BACTROBAN   oxyCODONE 5 MG immediate release tablet Commonly known as:  Oxy IR/ROXICODONE     TAKE these medications   ALPRAZolam 0.25 MG tablet Commonly known as:  XANAX Take 1 tablet (0.25 mg total) by mouth 2 (two) times daily as needed for anxiety.   aspirin 81 MG chewable tablet Chew 1 tablet (81 mg total) by mouth daily. Start taking on:  05/21/2017 Replaces:  aspirin 325 MG EC tablet   bethanechol 25 MG tablet Commonly known as:  URECHOLINE Take 1 tablet (25 mg total) by mouth 3 (three) times daily.   carvedilol 3.125 MG tablet Commonly known as:  COREG Take 1 tablet (3.125 mg total) by mouth 2 (two) times daily with a meal.   chlorhexidine 0.12 % solution Commonly known as:  PERIDEX 15 mLs by Mouth Rinse route 2 (two) times daily.   cholecalciferol 1000 units tablet Commonly known as:  VITAMIN D Take 1,000 Units by mouth 2 (two) times daily.   clopidogrel 75 MG tablet Commonly known as:  PLAVIX Take 1 tablet (75 mg total) by mouth daily with breakfast. Start taking on:  05/21/2017   docusate sodium 100 MG capsule Commonly known as:  COLACE Take 1 capsule (100 mg total) by mouth 2 (two) times daily.   ezetimibe-simvastatin 10-40 MG tablet Commonly known as:  VYTORIN Take 1 tablet by mouth daily.   feeding supplement (ENSURE ENLIVE) Liqd Take 237 mLs by mouth 3 (three) times daily between meals.   insulin aspart 100 UNIT/ML injection Commonly known as:  novoLOG Inject 0-15 Units into the skin 3 (three) times daily with meals.   insulin glargine 100 UNIT/ML injection Commonly known as:   LANTUS Inject 0.06 mLs (6 Units total) into the skin at bedtime. What changed:  how much to take   ipratropium-albuterol 0.5-2.5 (3) MG/3ML Soln Commonly known as:  DUONEB Take 3 mLs by nebulization every 4 (four) hours as needed.   levothyroxine 50 MCG tablet Commonly known as:  SYNTHROID, LEVOTHROID Take 50 mcg by mouth daily.   losartan 25 MG tablet Commonly known as:  COZAAR Take 0.5 tablets (12.5 mg total) by mouth daily. Start taking on:  05/21/2017 What changed:  how much to take   NONFORMULARY OR COMPOUNDED ITEM [facility]:  Onychomycosis Nail Lacquer - Fluconazole 2%, Terbinafine 1%, DMSO, apply daily to affected area.   spironolactone 25 MG tablet Commonly known as:  ALDACTONE Take 0.5 tablets (12.5 mg total) by mouth daily. Start taking on:  05/21/2017   tamsulosin 0.4 MG Caps capsule Commonly known as:  FLOMAX Take 1 capsule (0.4 mg total) by mouth daily.   torsemide 20 MG tablet Commonly known as:  DEMADEX Take 2 tablets (40 mg total) by mouth daily.  venlafaxine XR 150 MG 24 hr capsule Commonly known as:  EFFEXOR-XR Take 150 mg by mouth daily.   vitamin C 500 MG tablet Commonly known as:  ASCORBIC ACID Take 500 mg by mouth daily.            Durable Medical Equipment  (From admission, onward)        Start     Ordered   05/20/17 1046  DME Oxygen  Once    Question Answer Comment  Mode or (Route) Nasal cannula   Liters per Minute 2   Oxygen conserving device Yes   Oxygen delivery system Gas      05/20/17 1047       Signed: Barnetta ChapelSylvester I Herron Fero 05/20/2017, 10:48 AM

## 2017-05-21 ENCOUNTER — Telehealth: Payer: Self-pay | Admitting: *Deleted

## 2017-05-21 ENCOUNTER — Telehealth: Payer: Self-pay

## 2017-05-21 ENCOUNTER — Telehealth: Payer: Self-pay | Admitting: Internal Medicine

## 2017-05-21 MED ORDER — CARVEDILOL 3.125 MG PO TABS: 3.1250 mg | ORAL_TABLET | Freq: Two times a day (BID) | ORAL | 6 refills | Status: DC

## 2017-05-21 MED ORDER — TAMSULOSIN HCL 0.4 MG PO CAPS: 0.4000 mg | ORAL_CAPSULE | Freq: Every day | ORAL | 6 refills | Status: DC

## 2017-05-21 NOTE — Telephone Encounter (Signed)
TOC Pt-Please call patient-Pt has ana ppointment on 05-30-17 with Joni ReiningKathryn [name]         Please call,question about her medicine.

## 2017-05-21 NOTE — Telephone Encounter (Signed)
-----   Message from Ryan M Dunn, PA-C sent at 05/20/2017 11:20 AM EST ----- Please schedule this patient for 1 week TCM follow up with an APP or Dr. Hilty, per Dr. Koneswaran. Thanks! 

## 2017-05-21 NOTE — Telephone Encounter (Signed)
Patient contacted regarding discharge from [facility] on 05-20-17.  Patient understands to follow [facility] Patient understands discharge instructions? yes Patient understands medications and regiment? Yes, scripts for fosomax and carvedilol called to the pharmacy. Patient understands to bring all medications to this visit? yes

## 2017-05-21 NOTE — Telephone Encounter (Signed)
-----   Message from Sondra Bargesyan M Dunn, PA-C sent at 05/20/2017 11:20 AM EST ----- Please schedule this patient for 1 week TCM follow up with an APP or Dr. Rennis GoldenHilty, per Dr. Purvis SheffieldKoneswaran. Thanks!

## 2017-05-21 NOTE — Telephone Encounter (Signed)
Appointment made for 1/23 with Joni ReiningKathryn Lawrence, DNP.

## 2017-05-21 NOTE — Telephone Encounter (Signed)
Routed to admin pool to schedule appointment

## 2017-05-22 LAB — ACID FAST SMEAR (AFB, MYCOBACTERIA): Acid Fast Smear: NEGATIVE

## 2017-05-23 ENCOUNTER — Encounter (HOSPITAL_COMMUNITY): Payer: Self-pay | Admitting: Cardiovascular Disease

## 2017-05-23 LAB — CULTURE, BODY FLUID W GRAM STAIN -BOTTLE: Culture: NO GROWTH

## 2017-05-23 LAB — PATHOLOGIST SMEAR REVIEW: Path Review: REACTIVE

## 2017-05-23 LAB — CULTURE, BODY FLUID-BOTTLE

## 2017-05-25 ENCOUNTER — Telehealth (HOSPITAL_COMMUNITY): Payer: Self-pay

## 2017-05-25 NOTE — Telephone Encounter (Signed)
Patients insurance is active and benefits verified through [facility] - $10.00 co-pay, no deductible, out of pocket amount of $3,400/$0.00 has been met, no co-insurance, and no pre-authorization is required. Passport/reference [phone]  Patient will be contacted and scheduled after their follow up appt with the Cardiologist office upon review by the RN Navigator.

## 2017-05-29 ENCOUNTER — Encounter: Payer: Self-pay | Admitting: Adult Health

## 2017-05-29 ENCOUNTER — Ambulatory Visit: Payer: Medicare HMO | Admitting: Adult Health

## 2017-05-29 NOTE — Progress Notes (Deleted)
Cardiology Office Note   Date:  05/29/2017   ID:  Tara Barterolly K Hochstein, DOB February 12, 1946, MRN [id number]  PCP:  Dorisann FramesBalan, Bindubal, MD  Cardiologist:  Dr. Rennis GoldenHilty  No chief complaint on file.    History of Present Illness: [name] is a 72 y.o. female who presents for posthospitalization follow-up after admission for NSTEMI, combined systolic diastolic heart failure, acute respiratory failure with hypoxia, found to have recurrent bilateral pleural effusion with repeat thoracentesis.  She had cardiac catheterization followed by complex atherectomy and stenting of the native right coronary artery.  She was started on dual antiplatelet therapy with aspirin and Plavix.  Echocardiogram revealed significantly reduced ejection fraction of 20-25%, with mildly dilated LV and RV with evidence of ischemic cardiomyopathy.  Patient was found to have pulmonary edema and diuresed.  On discharge she was placed on torsemide 40 mg daily, spironolactone, Coreg, and losartan.    Past Medical History:  Diagnosis Date  . Cholelithiases   . Coronary artery disease 02/2007   CABG  . Diabetes (HCC)   . Dyslipidemia   . HTN (hypertension)   . Hypothyroid   . PAF (paroxysmal atrial fibrillation) (HCC)    post op  . S/P CABG x 4 2008    Past Surgical History:  Procedure Laterality Date  . ABDOMINAL HYSTERECTOMY    . CARDIAC CATHETERIZATION  03/01/2007   LAD with 99% prox lesion, at birfucation of Cfx there is 99% lesion, at bifurcation of large OM1 there is 99% lesion, 70% prox lesion at ramus intermedius, 90% lesion in prox RCA (Dr. Claudia DesanctisH. Solomon) - susequent CABG  . CORONARY ARTERY BYPASS GRAFT  03/05/2007   LIMA-LAD, SVG-ramus intermediate. SVG-OM, SVG-PDA (Dr. Donata ClayVan Trigt)  . CORONARY ATHERECTOMY N/A 05/17/2017   Procedure: CORONARY ATHERECTOMY;  Surgeon: Lennette BihariKelly, Thomas A, MD;  [facility];  Service: Cardiovascular;  Laterality: N/A;  . CORONARY STENT INTERVENTION N/A 05/17/2017   Procedure:  CORONARY STENT INTERVENTION;  Surgeon: Lennette BihariKelly, Thomas A, MD;  Location: [facility];  Service: Cardiovascular;  Laterality: N/A;  . INCONTINENCE SURGERY  2009  . IR ANGIOGRAM SELECTIVE EACH ADDITIONAL VESSEL  01/31/2017  . IR ANGIOGRAM SELECTIVE EACH ADDITIONAL VESSEL  01/31/2017  . IR ANGIOGRAM VISCERAL SELECTIVE  01/31/2017  . IR EMBO ART  VEN HEMORR LYMPH EXTRAV  INC GUIDE ROADMAPPING  01/31/2017  . IR THORACENTESIS ASP PLEURAL SPACE W/IMG GUIDE  05/11/2017  . IR THORACENTESIS ASP PLEURAL SPACE W/IMG GUIDE  05/18/2017  . IR US GUIDE VASC ACCESS RIGHT  01/31/2017  . LEFT HEART CATH AND CORS/GRAFTS ANGIOGRAPHY N/A 05/15/2017   Procedure: LEFT HEART CATH AND CORS/GRAFTS ANGIOGRAPHY;  Surgeon: Lennette BihariKelly, Thomas A, MD;  [facility]: [facility];  Service: Cardiovascular;  Laterality: N/A;  . LEFT HEART CATH AND CORS/GRAFTS ANGIOGRAPHY N/A 05/17/2017   Procedure: LEFT HEART CATH AND CORS/GRAFTS ANGIOGRAPHY;  Surgeon: Lennette BihariKelly, Thomas A, MD;  [facility]: [facility];  Service: Cardiovascular;  Laterality: N/A;  . NM MYOCAR PERF WALL MOTION  07/2011   bruce myoview - mild perfusion defect in basal inferolateral & mid inferolateral regions (attenuation artifact, but prior infarct annote be excluded)' EF 49%; low risk  . TRANSTHORACIC ECHOCARDIOGRAM  07/2011   EF 45-50%, mild septal hypokinesis, mild inferior wall hypokinesis; RV systolc function mildly reduced; borderline LA enlargement; mild MR & TR; AV mildly sclerotic; mild pulm valve regurg      Current Outpatient Medications  Medication Sig Dispense Refill  . ALPRAZolam Prudy Feeler(XANAX)  0.25 MG tablet Take 1 tablet (0.25 mg total) by mouth 2 (two) times daily as needed for anxiety. 10 tablet 0  . aspirin 81 MG chewable tablet Chew 1 tablet (81 mg total) by mouth daily. 30 tablet 1  . bethanechol (URECHOLINE) 25 MG tablet Take 1 tablet (25 mg total) by mouth 3 (three) times daily.    . carvedilol (COREG) 3.125 MG tablet Take 1 tablet (3.125 mg total) by  mouth 2 (two) times daily with a meal. 60 tablet 6  . chlorhexidine (PERIDEX) 0.12 % solution 15 mLs by Mouth Rinse route 2 (two) times daily. 120 mL 0  . cholecalciferol (VITAMIN D) 1000 UNITS tablet Take 1,000 Units by mouth 2 (two) times daily.     . clopidogrel (PLAVIX) 75 MG tablet Take 1 tablet (75 mg total) by mouth daily with breakfast. 30 tablet 1  . docusate sodium (COLACE) 100 MG capsule Take 1 capsule (100 mg total) by mouth 2 (two) times daily. 10 capsule 0  . ezetimibe-simvastatin (VYTORIN) 10-40 MG tablet Take 1 tablet by mouth daily. 90 tablet 3  . feeding supplement, ENSURE ENLIVE, (ENSURE ENLIVE) LIQD Take 237 mLs by mouth 3 (three) times daily between meals. 237 mL 12  . insulin aspart (NOVOLOG) 100 UNIT/ML injection Inject 0-15 Units into the skin 3 (three) times daily with meals. 10 mL 11  . insulin glargine (LANTUS) 100 UNIT/ML injection Inject 0.06 mLs (6 Units total) into the skin at bedtime. 10 mL 11  . ipratropium-albuterol (DUONEB) 0.5-2.5 (3) MG/3ML SOLN Take 3 mLs by nebulization every 4 (four) hours as needed. 360 mL   . levothyroxine (SYNTHROID, LEVOTHROID) 50 MCG tablet Take 50 mcg by mouth daily.    Marland Kitchen losartan (COZAAR) 25 MG tablet Take 0.5 tablets (12.5 mg total) by mouth daily. 30 tablet 0  . NONFORMULARY OR COMPOUNDED ITEM [facility]:  Onychomycosis Nail Lacquer - Fluconazole 2%, Terbinafine 1%, DMSO, apply daily to affected area. 480 each 11  . spironolactone (ALDACTONE) 25 MG tablet Take 0.5 tablets (12.5 mg total) by mouth daily. 30 tablet 1  . tamsulosin (FLOMAX) 0.4 MG CAPS capsule Take 1 capsule (0.4 mg total) by mouth daily. 30 capsule 6  . torsemide (DEMADEX) 20 MG tablet Take 2 tablets (40 mg total) by mouth daily. 60 tablet 0  . venlafaxine XR (EFFEXOR-XR) 150 MG 24 hr capsule Take 150 mg by mouth daily.    . vitamin C (ASCORBIC ACID) 500 MG tablet Take 500 mg by mouth daily.     No current facility-administered medications for this visit.      Allergies:   Crestor [rosuvastatin] and Lipitor [atorvastatin]    Social History:  The patient  reports that  has never smoked. she has never used smokeless tobacco. She reports that she does not drink alcohol or use drugs.   Family History:  The patient's family history includes Cancer in her father, maternal grandmother, and paternal grandmother; Diabetes in her child; Diabetes (age of onset: 79) in her mother; Heart attack (age of onset: 84) in her father; Heart disease in her mother; Hyperlipidemia in her mother; Thyroid disease in her sister.    ROS: All other systems are reviewed and negative. Unless otherwise mentioned in H&P    PHYSICAL EXAM: VS:  There were no vitals taken for this visit. , BMI There is no height or weight on file to calculate BMI. GEN: Well nourished, well developed, in no acute distress  HEENT: normal  Neck: no JVD,  carotid bruits, or masses Cardiac: ***RRR; no murmurs, rubs, or gallops,no edema  Respiratory:  clear to auscultation bilaterally, normal work of breathing GI: soft, nontender, nondistended, + BS MS: no deformity or atrophy  Skin: warm and dry, no rash Neuro:  Strength and sensation are intact Psych: euthymic mood, full affect   EKG:  EKG {ACTION; IS/IS [id number]} ordered today. The ekg ordered today demonstrates ***   Recent Labs: 05/04/2017: BNP 2,336.9 05/10/2017: ALT 15 05/11/2017: TSH 3.693 05/16/2017: Magnesium 2.1 05/18/2017: Hemoglobin 9.9; Platelets 285 05/20/2017: BUN 12; Creatinine, Ser 0.88; Potassium 3.9; Sodium 135    Lipid Panel    Component Value Date/Time   CHOL 86 02/11/2017 1403   TRIG 226 (H) 02/11/2017 1403   HDL 18 (L) 02/11/2017 1403   CHOLHDL 4.8 02/11/2017 1403   VLDL 45 (H) 02/11/2017 1403   LDLCALC 23 02/11/2017 1403      Wt Readings from Last 3 Encounters:  05/20/17 130 lb 6.4 oz (59.1 kg)  05/04/17 136 lb 9.6 oz (62 kg)  02/11/17 162 lb 0.6 oz (73.5 kg)      Other studies  Reviewed: Additional studies/ records that were reviewed today include: ***. Review of the above records demonstrates: ***   ASSESSMENT AND PLAN:  1.  ***   Current medicines are reviewed at length with the patient today.    Labs/ tests ordered today include: *** Bettey Mare. Liborio Nixon, ANP, AACC   05/29/2017 8:20 AM    [facility] [address] [place], [place] [address] Phone: [phone]; Fax: [phone]

## 2017-05-30 ENCOUNTER — Institutional Professional Consult (permissible substitution): Payer: Medicare HMO | Admitting: Pulmonary Disease

## 2017-05-30 ENCOUNTER — Ambulatory Visit: Payer: Medicare HMO | Admitting: Adult Health

## 2017-05-31 ENCOUNTER — Encounter: Payer: Self-pay | Admitting: *Deleted

## 2017-06-01 ENCOUNTER — Telehealth: Payer: Self-pay | Admitting: Pulmonary Disease

## 2017-06-01 ENCOUNTER — Ambulatory Visit (INDEPENDENT_AMBULATORY_CARE_PROVIDER_SITE_OTHER)
Admission: RE | Admit: 2017-06-01 | Discharge: 2017-06-01 | Disposition: A | Discharge status: Home / Self Care | Payer: Medicare HMO | Source: Ambulatory Visit | Attending: Pulmonary Disease | Admitting: Pulmonary Disease

## 2017-06-01 ENCOUNTER — Encounter: Payer: Self-pay | Admitting: Pulmonary Disease

## 2017-06-01 ENCOUNTER — Ambulatory Visit: Payer: Medicare HMO | Admitting: Pulmonary Disease

## 2017-06-01 VITALS — BP 124/76 | HR 89 | Ht 62.0 in | Wt 123.8 lb

## 2017-06-01 DIAGNOSIS — J90 Pleural effusion, not elsewhere classified: Secondary | ICD-10-CM
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
## 2017-06-01 NOTE — Telephone Encounter (Signed)
Spoke with patient. She was calling to check on status of her xray results. Advised her that PM had left for the day and we would call her as soon as we had the results.   She verbalized understanding. Nothing else needed at time of call.

## 2017-06-01 NOTE — Patient Instructions (Signed)
I am glad that your breathing is improving Your oxygen levels were stable on exertion today.  You can stop using the oxygen during the daytime Continue using your oxygen during the night for now.  Will reevaluate when you return at next visit We will get a chest x-ray today to make sure the fluid around the lung is improving. Please call your cardiologist and make an appointment to be seen as a follow-up after hospitalization. Follow-up in 6 months.
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
## 2017-06-11 ENCOUNTER — Encounter: Payer: Self-pay | Admitting: Adult Health

## 2017-06-11 ENCOUNTER — Ambulatory Visit: Payer: Medicare HMO | Admitting: Adult Health

## 2017-06-11 VITALS — BP 115/62 | HR 94 | Ht 62.0 in | Wt 125.0 lb

## 2017-06-11 DIAGNOSIS — I25119 Atherosclerotic heart disease of native coronary artery with unspecified angina pectoris: Secondary | ICD-10-CM

## 2017-06-11 DIAGNOSIS — I5042 Chronic combined systolic (congestive) and diastolic (congestive) heart failure: Secondary | ICD-10-CM | POA: Diagnosis not present

## 2017-06-11 DIAGNOSIS — E7800 Pure hypercholesterolemia, unspecified: Secondary | ICD-10-CM | POA: Diagnosis not present

## 2017-06-11 DIAGNOSIS — I10 Essential (primary) hypertension: Secondary | ICD-10-CM | POA: Diagnosis not present

## 2017-06-11 MED ORDER — EZETIMIBE-SIMVASTATIN 10-40 MG PO TABS: 1.0000 | ORAL_TABLET | Freq: Every day | ORAL | 3 refills | Status: DC

## 2017-06-11 MED ORDER — SACUBITRIL-VALSARTAN 24-26 MG PO TABS: 1.0000 | ORAL_TABLET | Freq: Two times a day (BID) | ORAL | 5 refills | Status: DC

## 2017-06-11 MED ORDER — SPIRONOLACTONE 25 MG PO TABS: 25.0000 mg | ORAL_TABLET | Freq: Every day | ORAL | 1 refills | Status: DC

## 2017-06-11 MED ORDER — CARVEDILOL 3.125 MG PO TABS: 3.1250 mg | ORAL_TABLET | Freq: Two times a day (BID) | ORAL | 1 refills | Status: DC

## 2017-06-11 MED ORDER — CLOPIDOGREL BISULFATE 75 MG PO TABS: 75.0000 mg | ORAL_TABLET | Freq: Every day | ORAL | 1 refills | Status: DC

## 2017-06-11 MED ORDER — LOSARTAN POTASSIUM 25 MG PO TABS: 12.5000 mg | ORAL_TABLET | Freq: Every day | ORAL | 1 refills | Status: DC

## 2017-06-11 MED ORDER — TORSEMIDE 20 MG PO TABS: 40.0000 mg | ORAL_TABLET | Freq: Every day | ORAL | 1 refills | Status: DC

## 2017-06-11 MED ORDER — CARVEDILOL 6.25 MG PO TABS: 6.2500 mg | ORAL_TABLET | Freq: Two times a day (BID) | ORAL | 2 refills | Status: DC

## 2017-06-11 NOTE — Patient Instructions (Signed)
Medication Instructions:  STOP LOSARTAN START ENTRESTO 24/26MG  TWICE DAILY  INCREASE CARVEDILOL 6.25MG  TWICE DAILY   If you need a refill on your cardiac medications before your next appointment, please call your pharmacy.  Special Instructions: REMEMBER CARVEDILOL MAY MAKE YOUR BP LOWER  REFER TO [facility] FAILURE [facility]IN[facility]   Follow-Up: Your physician wants you to follow-up in: 3-4 MONTHS AFTER HEART FAILURE CLINIC   Thank you for choosing [facility] at [place]!!

## 2017-07-09 ENCOUNTER — Encounter (HOSPITAL_COMMUNITY): Payer: Medicare HMO | Admitting: Cardiology

## 2017-07-09 ENCOUNTER — Other Ambulatory Visit: Payer: Self-pay

## 2017-07-09 MED ORDER — EZETIMIBE 10 MG PO TABS: 10.0000 mg | ORAL_TABLET | Freq: Every day | ORAL | 3 refills | Status: DC

## 2017-07-09 MED ORDER — SIMVASTATIN 40 MG PO TABS: 40.0000 mg | ORAL_TABLET | Freq: Every day | ORAL | 3 refills | Status: DC

## 2017-07-11 ENCOUNTER — Other Ambulatory Visit: Payer: Self-pay

## 2017-07-11 ENCOUNTER — Telehealth: Payer: Self-pay | Admitting: Internal Medicine

## 2017-07-11 MED ORDER — LEVOTHYROXINE SODIUM 50 MCG PO TABS: 50.0000 ug | ORAL_TABLET | Freq: Every day | ORAL | 3 refills | Status: DC

## 2017-07-11 MED ORDER — CARVEDILOL 6.25 MG PO TABS: 6.2500 mg | ORAL_TABLET | Freq: Two times a day (BID) | ORAL | 2 refills | Status: DC

## 2017-07-11 MED ORDER — SIMVASTATIN 40 MG PO TABS: 40.0000 mg | ORAL_TABLET | Freq: Every day | ORAL | 3 refills | Status: DC

## 2017-07-11 MED ORDER — SPIRONOLACTONE 25 MG PO TABS: 25.0000 mg | ORAL_TABLET | Freq: Every day | ORAL | 1 refills | Status: DC

## 2017-07-11 MED ORDER — TAMSULOSIN HCL 0.4 MG PO CAPS: 0.4000 mg | ORAL_CAPSULE | Freq: Every day | ORAL | 6 refills | Status: DC

## 2017-07-11 MED ORDER — CLOPIDOGREL BISULFATE 75 MG PO TABS: 75.0000 mg | ORAL_TABLET | Freq: Every day | ORAL | 1 refills | Status: DC

## 2017-07-11 NOTE — Telephone Encounter (Signed)
Per Marcelino DusterMichelle, LPN - patient has picked up Rx(s) and entresto has been approved until 07/2019. No further action needed.

## 2017-07-11 NOTE — Telephone Encounter (Signed)
Received fax for "request for redetermination of medicare prescription drug denial" [facility] [address]. The faxed form list patient's demographics but does not state the medication in question.   Attempted to call patient to determine what medication has been denied - phone rang, no answer.   Phone # for appeals: [phone] Fax # for appeals: [phone]  Patient saw Lyman BishopLawrence, [place] on 06/11/17

## 2017-07-12 ENCOUNTER — Other Ambulatory Visit: Payer: Self-pay | Admitting: *Deleted

## 2017-07-12 ENCOUNTER — Telehealth (HOSPITAL_COMMUNITY): Payer: Self-pay

## 2017-07-12 MED ORDER — TORSEMIDE 20 MG PO TABS: 40.0000 mg | ORAL_TABLET | Freq: Every day | ORAL | 2 refills | Status: DC

## 2017-07-12 MED ORDER — VENLAFAXINE HCL ER 150 MG PO CP24: 150.0000 mg | ORAL_CAPSULE | Freq: Every day | ORAL | 2 refills | Status: DC

## 2017-07-12 NOTE — Telephone Encounter (Signed)
Called to speak with patient in regards to Cardiac Rehab - Patient stated she is not interested in the program due to transportation issues. Closed referral.

## 2017-07-16 ENCOUNTER — Other Ambulatory Visit: Payer: Self-pay

## 2017-07-19 ENCOUNTER — Telehealth: Payer: Self-pay | Admitting: Internal Medicine

## 2017-07-19 NOTE — Telephone Encounter (Signed)
[facility] for xanax. Explained that this should be deferred to PCP.

## 2017-07-26 ENCOUNTER — Telehealth: Payer: Self-pay

## 2017-07-26 NOTE — Telephone Encounter (Signed)
[name] - what medicine needs refilled? It was not noted in your message. I typically refill only cardiac meds unless it was something that I prescribed.  Thanks.  Dr. HRexene Edison

## 2017-08-13 ENCOUNTER — Encounter (HOSPITAL_COMMUNITY): Payer: Self-pay | Admitting: Cardiology

## 2017-08-13 ENCOUNTER — Other Ambulatory Visit: Payer: Self-pay

## 2017-08-13 ENCOUNTER — Ambulatory Visit (HOSPITAL_COMMUNITY)
Admission: RE | Admit: 2017-08-13 | Discharge: 2017-08-13 | Disposition: A | Discharge status: Home / Self Care | Payer: Medicare HMO | Source: Ambulatory Visit | Attending: Cardiology | Admitting: Cardiology

## 2017-08-13 VITALS — BP 112/67 | HR 69 | Wt 120.2 lb

## 2017-08-13 DIAGNOSIS — Z809 Family history of malignant neoplasm, unspecified: Secondary | ICD-10-CM | POA: Diagnosis not present

## 2017-08-13 DIAGNOSIS — Z8249 Family history of ischemic heart disease and other diseases of the circulatory system: Secondary | ICD-10-CM | POA: Diagnosis not present

## 2017-08-13 DIAGNOSIS — I2510 Atherosclerotic heart disease of native coronary artery without angina pectoris: Secondary | ICD-10-CM | POA: Insufficient documentation

## 2017-08-13 DIAGNOSIS — Z833 Family history of diabetes mellitus: Secondary | ICD-10-CM | POA: Insufficient documentation

## 2017-08-13 DIAGNOSIS — E119 Type 2 diabetes mellitus without complications: Secondary | ICD-10-CM | POA: Insufficient documentation

## 2017-08-13 DIAGNOSIS — E785 Hyperlipidemia, unspecified: Secondary | ICD-10-CM | POA: Diagnosis not present

## 2017-08-13 DIAGNOSIS — I5022 Chronic systolic (congestive) heart failure: Secondary | ICD-10-CM | POA: Diagnosis not present

## 2017-08-13 DIAGNOSIS — Z951 Presence of aortocoronary bypass graft: Secondary | ICD-10-CM | POA: Insufficient documentation

## 2017-08-13 DIAGNOSIS — Z79899 Other long term (current) drug therapy: Secondary | ICD-10-CM | POA: Insufficient documentation

## 2017-08-13 DIAGNOSIS — I480 Paroxysmal atrial fibrillation: Secondary | ICD-10-CM | POA: Diagnosis not present

## 2017-08-13 DIAGNOSIS — E039 Hypothyroidism, unspecified: Secondary | ICD-10-CM | POA: Insufficient documentation

## 2017-08-13 DIAGNOSIS — Z7982 Long term (current) use of aspirin: Secondary | ICD-10-CM | POA: Insufficient documentation

## 2017-08-13 DIAGNOSIS — I110 Hypertensive heart disease with heart failure: Secondary | ICD-10-CM | POA: Insufficient documentation

## 2017-08-13 DIAGNOSIS — Z794 Long term (current) use of insulin: Secondary | ICD-10-CM | POA: Diagnosis not present

## 2017-08-13 DIAGNOSIS — Z7902 Long term (current) use of antithrombotics/antiplatelets: Secondary | ICD-10-CM | POA: Insufficient documentation

## 2017-08-13 DIAGNOSIS — I5042 Chronic combined systolic (congestive) and diastolic (congestive) heart failure: Secondary | ICD-10-CM | POA: Diagnosis not present

## 2017-08-13 DIAGNOSIS — I255 Ischemic cardiomyopathy: Secondary | ICD-10-CM | POA: Diagnosis not present

## 2017-08-13 LAB — LIPID PANEL
CHOL/HDL RATIO: 2.4 ratio
Cholesterol: 122 mg/dL (ref 0–200)
HDL: 51 mg/dL (ref >40)
LDL CALC: 49 mg/dL (ref 0–99)
TRIGLYCERIDES: 110 mg/dL (ref <150)
VLDL: 22 mg/dL (ref 0–40)

## 2017-08-13 LAB — BASIC METABOLIC PANEL
ANION GAP: 11 (ref 5–15)
BUN: 30 mg/dL — ABNORMAL HIGH (ref 6–20)
CO2: 28 mmol/L (ref 22–32)
Calcium: 9.5 mg/dL (ref 8.9–10.3)
Chloride: 94 mmol/L — ABNORMAL LOW (ref 101–111)
Creatinine, Ser: 1.29 mg/dL — ABNORMAL HIGH (ref 0.44–1.00)
GFR, EST AFRICAN AMERICAN: 46 mL/min — AB (ref >60)
GFR, EST NON AFRICAN AMERICAN: 40 mL/min — AB (ref >60)
GLUCOSE: 354 mg/dL — AB (ref 65–99)
POTASSIUM: 4.3 mmol/L (ref 3.5–5.1)
Sodium: 133 mmol/L — ABNORMAL LOW (ref 135–145)

## 2017-08-13 LAB — CBC
HEMATOCRIT: 34.5 % — AB (ref 36.0–46.0)
HEMOGLOBIN: 10.6 g/dL — AB (ref 12.0–15.0)
MCH: 27.1 pg (ref 26.0–34.0)
MCHC: 30.7 g/dL (ref 30.0–36.0)
MCV: 88.2 fL (ref 78.0–100.0)
Platelets: 300 10*3/uL (ref 150–400)
RBC: 3.91 MIL/uL (ref 3.87–5.11)
RDW: 16.5 % — ABNORMAL HIGH (ref 11.5–15.5)
WBC: 10 10*3/uL (ref 4.0–10.5)

## 2017-08-13 MED ORDER — ASPIRIN EC 81 MG PO TBEC: 81.0000 mg | DELAYED_RELEASE_TABLET | Freq: Every day | ORAL | 3 refills | Status: DC

## 2017-08-13 MED ORDER — CARVEDILOL 3.125 MG PO TABS: 3.1250 mg | ORAL_TABLET | Freq: Two times a day (BID) | ORAL | 3 refills | Status: DC

## 2017-08-13 MED ORDER — SPIRONOLACTONE 25 MG PO TABS: 12.5000 mg | ORAL_TABLET | Freq: Every day | ORAL | 3 refills | Status: DC

## 2017-08-13 NOTE — Patient Instructions (Signed)
Labs today (will call for abnormal results, otherwise no news is good news)  DECREASE Spironolactone to 12.5 mg (0.5 Tablet) Once Daily at bedtime/evening.  DECREASE Carvedilol to 3.125 mg (1 Tablet) Twice Daily  START wearing compression stockings at all times.  Take rest breaks as needed.   Echocardiogram has been ordered for you, we will schedule at checkout.  Follow up with the APP clinic in 3 weeks  Follow up with Dr. Shirlee LatchMcLean in 6 weeks.

## 2017-08-14 NOTE — Progress Notes (Signed)
PCP: Dorisann Frames, MD Cardiology: Dr. Rennis Golden HF Cardiology: Dr. Shirlee Latch  72 yo with history of CAD and ischemic cardiomyopathy presents for followup of CAD and CHF.  She had CABG x 4 in 2008.  Echo in 3/13 was 40-45%.  By 10/18, EF had dropped to 20% by echo.    She was admitted in 1/19 with CHF exacerbation/NSTEMI.  LHC showed occluded SVG-PDA and serial 70% and 90% stenoses in the proximal to mid RCA.  She had atherectomy with DES x 2 to RCA.  Echo in 1/19 with EF 20-25%.  She was diuresed and had left thoracentesis for pleural effusion.   She has not had any chest pain.  No dyspnea walking on flat ground.  She is able to do housework without problems.  Mild dyspnea walking up stairs.  Main complaint is lightheadedness with standing.  She was orthostatic when checked in the office today.  She fell about 3 weeks ago.  Now using a cane for balance.   ECG (personally reviewed): NSR, nonspecific T wave flattening  Labs (1/19): K 3.9, creatinine 0.88  PMH: 1. CAD: s/p CABG x 4 in 2008.  - NSTEMI 1/19 with LHC showing patent LIMA-LAD, 80% proximal LAD, 70% proximal RCA, 90% mid RCA, SVG-ramus patent, SVG-OM patent, SVG-PDA occluded.  Patient had atherectomy with DES x 2 to prox and mid RCA.   2. Chronic systolic CHF: Ischemic cardiomyopathy.  - Echo (3/13): EF 45-50%.  - Echo (10/18): EF 20%.  - Echo (1/19): EF 20-25%, diffuse hypokinesis, mild LV dilation.  3. HTN 4. H/o left pleural effusion 5. Hyperlipidemia 6. Type II diabetes 7. Atrial fibrillation: Paroxysmal, noted only post-op CABG.  8. Hypothyroidism 9. Depression  Social History   Socioeconomic History  . Marital status: Married    Spouse name: Not on file  . Number of children: 1  . Years of education: 23  . Highest education level: Not on file  Occupational History    Employer: [employer]  Social Needs  . Financial resource strain: Not on file  . Food insecurity:    Worry: Not on file    Inability: Not on  file  . Transportation needs:    Medical: Not on file    Non-medical: Not on file  Tobacco Use  . Smoking status: Never Smoker  . Smokeless tobacco: Never Used  Substance and Sexual Activity  . Alcohol use: No  . Drug use: No  . Sexual activity: Never    Birth control/protection: Abstinence  Lifestyle  . Physical activity:    Days per week: Not on file    Minutes per session: Not on file  . Stress: Not on file  Relationships  . Social connections:    Talks on phone: Not on file    Gets together: Not on file    Attends religious service: Not on file    Active member of club or organization: Not on file    Attends meetings of clubs or organizations: Not on file    Relationship status: Not on file  . Intimate partner violence:    Fear of current or ex partner: Not on file    Emotionally abused: Not on file    Physically abused: Not on file    Forced sexual activity: Not on file  Other Topics Concern  . Not on file  Social History Narrative  . Not on file   Family History  Problem Relation Age of Onset  . Diabetes Mother 28  .  Heart disease Mother   . Hyperlipidemia Mother   . Cancer Father   . Heart attack Father [id number]  . Cancer Maternal Grandmother   . Cancer Paternal Grandmother   . Thyroid disease Sister   . Diabetes Child    ROS: All systems reviewed and negative except as per HPI.   Current Outpatient Medications  Medication Sig Dispense Refill  . ALPRAZolam (XANAX) 0.25 MG tablet Take 1 tablet (0.25 mg total) by mouth 2 (two) times daily as needed for anxiety. 10 tablet 0  . carvedilol (COREG) 3.125 MG tablet Take 1 tablet (3.125 mg total) by mouth 2 (two) times daily with a meal. 180 tablet 3  . chlorhexidine (PERIDEX) 0.12 % solution 15 mLs by Mouth Rinse route 2 (two) times daily. 120 mL 0  . cholecalciferol (VITAMIN D) 1000 UNITS tablet Take 1,000 Units by mouth 2 (two) times daily.     . clopidogrel (PLAVIX) 75 MG tablet Take 1 tablet (75 mg total) by mouth  daily with breakfast. 90 tablet 1  . docusate sodium (COLACE) 100 MG capsule Take 1 capsule (100 mg total) by mouth 2 (two) times daily. 10 capsule 0  . ezetimibe (ZETIA) 10 MG tablet Take 1 tablet (10 mg total) by mouth daily. 90 tablet 3  . feeding supplement, ENSURE ENLIVE, (ENSURE ENLIVE) LIQD Take 237 mLs by mouth 3 (three) times daily between meals. 237 mL 12  . insulin aspart (NOVOLOG) 100 UNIT/ML injection Inject 0-15 Units into the skin 3 (three) times daily with meals. 10 mL 11  . insulin glargine (LANTUS) 100 UNIT/ML injection Inject 0.06 mLs (6 Units total) into the skin at bedtime. 10 mL 11  . ipratropium-albuterol (DUONEB) 0.5-2.5 (3) MG/3ML SOLN Take 3 mLs by nebulization every 4 (four) hours as needed. 360 mL   . levothyroxine (SYNTHROID, LEVOTHROID) 50 MCG tablet Take 1 tablet (50 mcg total) by mouth daily. 90 tablet 3  . NONFORMULARY OR COMPOUNDED ITEM [facility]:  Onychomycosis Nail Lacquer - Fluconazole 2%, Terbinafine 1%, DMSO, apply daily to affected area. 480 each 11  . sacubitril-valsartan (ENTRESTO) 24-26 MG Take 1 tablet by mouth 2 (two) times daily. 60 tablet 5  . simvastatin (ZOCOR) 40 MG tablet Take 1 tablet (40 mg total) by mouth at bedtime. 90 tablet 3  . spironolactone (ALDACTONE) 25 MG tablet Take 0.5 tablets (12.5 mg total) by mouth at bedtime. 45 tablet 3  . torsemide (DEMADEX) 20 MG tablet Take 2 tablets (40 mg total) by mouth daily. 180 tablet 2  . venlafaxine XR (EFFEXOR-XR) 150 MG 24 hr capsule Take 1 capsule (150 mg total) by mouth daily. 90 capsule 2  . vitamin C (ASCORBIC ACID) 500 MG tablet Take 500 mg by mouth daily.    Marland Kitchen. aspirin EC 81 MG tablet Take 1 tablet (81 mg total) by mouth daily. 90 tablet 3   No current facility-administered medications for this encounter.    BP 112/67   Pulse 69   Wt 120 lb 4 oz (54.5 kg)   SpO2 98%   BMI 21.99 kg/m  General: NAD Neck: No JVD, no thyromegaly or thyroid nodule.  Lungs: Clear to auscultation  bilaterally with normal respiratory effort. CV: Nondisplaced PMI.  Heart regular S1/S2, no S3/S4, no murmur.  No peripheral edema.  No carotid bruit.  Normal pedal pulses.  Abdomen: Soft, nontender, no hepatosplenomegaly, no distention.  Skin: Intact without lesions or rashes.  Neurologic: Alert and oriented x 3.  Psych: Normal affect. Extremities: No  clubbing or cyanosis.  HEENT: Normal.   Assessment/Plan: 1. Chronic systolic CHF: Ischemic cardiomyopathy.  Echo 1/19 with EF 20-25%.  NYHA class II symptoms but she is orthostatic.  Not volume overloaded on exam.  - Decrease spironolactone to 12.5 mg and take in evening.  - Decrease Coreg to 3.125 mg bid.  - Will try to continue on low dose Entresto.   - Continue torsemide 40 mg daily, BMET today.  - Given orthostasis, will have her wear graded compression stockings during the day.   - I will arrange for echo to determine need for ICD.  With narrow QRS, not a CRT candidate.  2. CAD: No chest pain.  - Continue Plavix, ASA 81 daily. Would continue Plavix long-term.  - Continue simvastatin 40 daily with Zetia 10 daily. Check lipids today.  3. Diabetes: PCP should consider SGLT-2 inhibitor given CHF.  4. Atrial fibrillation: Paroxysmal, noted on post-op CABG.  Will need anticoagulation if it recurs.   Followup with me in 6 wks, PA/NP in 3 wks.   Marca Ancona 08/14/2017

## 2017-08-16 ENCOUNTER — Encounter (HOSPITAL_COMMUNITY): Payer: Self-pay

## 2017-09-03 ENCOUNTER — Encounter (HOSPITAL_COMMUNITY): Payer: Self-pay

## 2017-09-03 ENCOUNTER — Ambulatory Visit (HOSPITAL_BASED_OUTPATIENT_CLINIC_OR_DEPARTMENT_OTHER)
Admission: RE | Admit: 2017-09-03 | Discharge: 2017-09-03 | Disposition: A | Discharge status: Home / Self Care | Payer: Medicare HMO | Source: Ambulatory Visit | Attending: Cardiology | Admitting: Cardiology

## 2017-09-03 ENCOUNTER — Ambulatory Visit (HOSPITAL_COMMUNITY)
Admission: RE | Admit: 2017-09-03 | Discharge: 2017-09-03 | Disposition: A | Discharge status: Home / Self Care | Payer: Medicare HMO | Source: Ambulatory Visit | Attending: Endocrinology | Admitting: Endocrinology

## 2017-09-03 VITALS — BP 92/54 | HR 40 | Wt 119.0 lb

## 2017-09-03 DIAGNOSIS — I480 Paroxysmal atrial fibrillation: Secondary | ICD-10-CM | POA: Diagnosis not present

## 2017-09-03 DIAGNOSIS — Z794 Long term (current) use of insulin: Secondary | ICD-10-CM | POA: Insufficient documentation

## 2017-09-03 DIAGNOSIS — F329 Major depressive disorder, single episode, unspecified: Secondary | ICD-10-CM | POA: Insufficient documentation

## 2017-09-03 DIAGNOSIS — Z7982 Long term (current) use of aspirin: Secondary | ICD-10-CM | POA: Insufficient documentation

## 2017-09-03 DIAGNOSIS — I493 Ventricular premature depolarization: Secondary | ICD-10-CM | POA: Diagnosis not present

## 2017-09-03 DIAGNOSIS — I5042 Chronic combined systolic (congestive) and diastolic (congestive) heart failure: Secondary | ICD-10-CM

## 2017-09-03 DIAGNOSIS — E119 Type 2 diabetes mellitus without complications: Secondary | ICD-10-CM | POA: Insufficient documentation

## 2017-09-03 DIAGNOSIS — I5022 Chronic systolic (congestive) heart failure: Secondary | ICD-10-CM | POA: Insufficient documentation

## 2017-09-03 DIAGNOSIS — Z8709 Personal history of other diseases of the respiratory system: Secondary | ICD-10-CM | POA: Diagnosis not present

## 2017-09-03 DIAGNOSIS — Z79899 Other long term (current) drug therapy: Secondary | ICD-10-CM | POA: Diagnosis not present

## 2017-09-03 DIAGNOSIS — Z7902 Long term (current) use of antithrombotics/antiplatelets: Secondary | ICD-10-CM | POA: Diagnosis not present

## 2017-09-03 DIAGNOSIS — I951 Orthostatic hypotension: Secondary | ICD-10-CM | POA: Insufficient documentation

## 2017-09-03 DIAGNOSIS — I110 Hypertensive heart disease with heart failure: Secondary | ICD-10-CM | POA: Diagnosis not present

## 2017-09-03 DIAGNOSIS — I25708 Atherosclerosis of coronary artery bypass graft(s), unspecified, with other forms of angina pectoris: Secondary | ICD-10-CM | POA: Diagnosis not present

## 2017-09-03 DIAGNOSIS — E039 Hypothyroidism, unspecified: Secondary | ICD-10-CM | POA: Diagnosis not present

## 2017-09-03 DIAGNOSIS — I2510 Atherosclerotic heart disease of native coronary artery without angina pectoris: Secondary | ICD-10-CM | POA: Insufficient documentation

## 2017-09-03 DIAGNOSIS — I252 Old myocardial infarction: Secondary | ICD-10-CM | POA: Diagnosis not present

## 2017-09-03 DIAGNOSIS — Z951 Presence of aortocoronary bypass graft: Secondary | ICD-10-CM | POA: Insufficient documentation

## 2017-09-03 DIAGNOSIS — E785 Hyperlipidemia, unspecified: Secondary | ICD-10-CM | POA: Diagnosis not present

## 2017-09-03 DIAGNOSIS — I255 Ischemic cardiomyopathy: Secondary | ICD-10-CM | POA: Insufficient documentation

## 2017-09-03 LAB — CBC
HCT: 33.5 % — ABNORMAL LOW (ref 36.0–46.0)
Hemoglobin: 10.7 g/dL — ABNORMAL LOW (ref 12.0–15.0)
MCH: 28.4 pg (ref 26.0–34.0)
MCHC: 31.9 g/dL (ref 30.0–36.0)
MCV: 88.9 fL (ref 78.0–100.0)
PLATELETS: 312 10*3/uL (ref 150–400)
RBC: 3.77 MIL/uL — ABNORMAL LOW (ref 3.87–5.11)
RDW: 15.9 % — AB (ref 11.5–15.5)
WBC: 11.9 10*3/uL — ABNORMAL HIGH (ref 4.0–10.5)

## 2017-09-03 LAB — BASIC METABOLIC PANEL
Anion gap: 10 (ref 5–15)
BUN: 32 mg/dL — AB (ref 6–20)
CHLORIDE: 95 mmol/L — AB (ref 101–111)
CO2: 28 mmol/L (ref 22–32)
CREATININE: 1.31 mg/dL — AB (ref 0.44–1.00)
Calcium: 9.9 mg/dL (ref 8.9–10.3)
GFR calc Af Amer: 46 mL/min — ABNORMAL LOW (ref >60)
GFR calc non Af Amer: 39 mL/min — ABNORMAL LOW (ref >60)
GLUCOSE: 264 mg/dL — AB (ref 65–99)
Potassium: 5.1 mmol/L (ref 3.5–5.1)
Sodium: 133 mmol/L — ABNORMAL LOW (ref 135–145)

## 2017-09-03 MED ORDER — TORSEMIDE 20 MG PO TABS: 20.0000 mg | ORAL_TABLET | Freq: Every day | ORAL | 3 refills | Status: DC

## 2017-09-03 MED ORDER — SPIRONOLACTONE 25 MG PO TABS: 12.5000 mg | ORAL_TABLET | Freq: Every day | ORAL | 3 refills | Status: DC

## 2017-09-03 NOTE — Progress Notes (Signed)
Advanced Heart Failure Clinic Note  PCP: Dorisann Frames, MD Cardiology: Dr. Rennis Golden HF Cardiology: Dr. Shirlee Latch  71 yo with history of CAD and ischemic cardiomyopathy presents for followup of CAD and CHF.  She had CABG x 4 in 2008.  Echo in 3/13 was 40-45%.  By 10/18, EF had dropped to 20% by echo.    She was admitted in 1/19 with CHF exacerbation/NSTEMI.  LHC showed occluded SVG-PDA and serial 70% and 90% stenoses in the proximal to mid RCA.  She had atherectomy with DES x 2 to RCA.  Echo in 1/19 with EF 20-25%.  She was diuresed and had left thoracentesis for pleural effusion.   She presents today for regular follow up. She remains orthostatic on arrival. She is lightheaded with any standing. She fell again ~ 2 weeks ago but denies injury. She denies dyspnea on flat ground or housework, mostly limited by her lightheadedness. Taking all medications, although she did not decrease spiro to half tab.   ECG 06/11/17: NSR, nonspecific T wave flattening  Labs (1/19): K 3.9, creatinine 0.88  PMH: 1. CAD: s/p CABG x 4 in 2008.  - NSTEMI 1/19 with LHC showing patent LIMA-LAD, 80% proximal LAD, 70% proximal RCA, 90% mid RCA, SVG-ramus patent, SVG-OM patent, SVG-PDA occluded.  Patient had atherectomy with DES x 2 to prox and mid RCA.   2. Chronic systolic CHF: Ischemic cardiomyopathy.  - Echo (3/13): EF 45-50%.  - Echo (10/18): EF 20%.  - Echo (1/19): EF 20-25%, diffuse hypokinesis, mild LV dilation.  3. HTN 4. H/o left pleural effusion 5. Hyperlipidemia 6. Type II diabetes 7. Atrial fibrillation: Paroxysmal, noted only post-op CABG.  8. Hypothyroidism 9. Depression  Review of systems complete and found to be negative unless listed in HPI.    Social History   Socioeconomic History  . Marital status: Married    Spouse name: Not on file  . Number of children: 1  . Years of education: 66  . Highest education level: Not on file  Occupational History    Employer: [employer]  Social  Needs  . Financial resource strain: Not on file  . Food insecurity:    Worry: Not on file    Inability: Not on file  . Transportation needs:    Medical: Not on file    Non-medical: Not on file  Tobacco Use  . Smoking status: Never Smoker  . Smokeless tobacco: Never Used  Substance and Sexual Activity  . Alcohol use: No  . Drug use: No  . Sexual activity: Never    Birth control/protection: Abstinence  Lifestyle  . Physical activity:    Days per week: Not on file    Minutes per session: Not on file  . Stress: Not on file  Relationships  . Social connections:    Talks on phone: Not on file    Gets together: Not on file    Attends religious service: Not on file    Active member of club or organization: Not on file    Attends meetings of clubs or organizations: Not on file    Relationship status: Not on file  . Intimate partner violence:    Fear of current or ex partner: Not on file    Emotionally abused: Not on file    Physically abused: Not on file    Forced sexual activity: Not on file  Other Topics Concern  . Not on file  Social History Narrative  . Not on file  Family History  Problem Relation Age of Onset  . Diabetes Mother 88  . Heart disease Mother   . Hyperlipidemia Mother   . Cancer Father   . Heart attack Father 88  . Cancer Maternal Grandmother   . Cancer Paternal Grandmother   . Thyroid disease Sister   . Diabetes Child    Review of systems complete and found to be negative unless listed in HPI.    Current Outpatient Medications  Medication Sig Dispense Refill  . ALPRAZolam (XANAX) 0.25 MG tablet Take 1 tablet (0.25 mg total) by mouth 2 (two) times daily as needed for anxiety. 10 tablet 0  . aspirin EC 81 MG tablet Take 1 tablet (81 mg total) by mouth daily. 90 tablet 3  . carvedilol (COREG) 3.125 MG tablet Take 1 tablet (3.125 mg total) by mouth 2 (two) times daily with a meal. 180 tablet 3  . chlorhexidine (PERIDEX) 0.12 % solution 15 mLs by Mouth  Rinse route 2 (two) times daily. 120 mL 0  . cholecalciferol (VITAMIN D) 1000 UNITS tablet Take 1,000 Units by mouth 2 (two) times daily.     . clopidogrel (PLAVIX) 75 MG tablet Take 1 tablet (75 mg total) by mouth daily with breakfast. 90 tablet 1  . docusate sodium (COLACE) 100 MG capsule Take 1 capsule (100 mg total) by mouth 2 (two) times daily. 10 capsule 0  . ezetimibe (ZETIA) 10 MG tablet Take 1 tablet (10 mg total) by mouth daily. 90 tablet 3  . feeding supplement, ENSURE ENLIVE, (ENSURE ENLIVE) LIQD Take 237 mLs by mouth 3 (three) times daily between meals. 237 mL 12  . insulin aspart (NOVOLOG) 100 UNIT/ML injection Inject 0-15 Units into the skin 3 (three) times daily with meals. 10 mL 11  . insulin glargine (LANTUS) 100 UNIT/ML injection Inject 0.06 mLs (6 Units total) into the skin at bedtime. 10 mL 11  . ipratropium-albuterol (DUONEB) 0.5-2.5 (3) MG/3ML SOLN Take 3 mLs by nebulization every 4 (four) hours as needed. 360 mL   . levothyroxine (SYNTHROID, LEVOTHROID) 50 MCG tablet Take 1 tablet (50 mcg total) by mouth daily. 90 tablet 3  . NONFORMULARY OR COMPOUNDED ITEM [facility]:  Onychomycosis Nail Lacquer - Fluconazole 2%, Terbinafine 1%, DMSO, apply daily to affected area. 480 each 11  . sacubitril-valsartan (ENTRESTO) 24-26 MG Take 1 tablet by mouth 2 (two) times daily. 60 tablet 5  . simvastatin (ZOCOR) 40 MG tablet Take 1 tablet (40 mg total) by mouth at bedtime. 90 tablet 3  . spironolactone (ALDACTONE) 25 MG tablet Take 0.5 tablets (12.5 mg total) by mouth at bedtime. 45 tablet 3  . torsemide (DEMADEX) 20 MG tablet Take 2 tablets (40 mg total) by mouth daily. 180 tablet 2  . venlafaxine XR (EFFEXOR-XR) 150 MG 24 hr capsule Take 1 capsule (150 mg total) by mouth daily. 90 capsule 2  . vitamin C (ASCORBIC ACID) 500 MG tablet Take 500 mg by mouth daily.     No current facility-administered medications for this encounter.    Vitals:   09/03/17 1359 09/03/17 1404  BP:  120/60 (!) 92/54  Pulse: (!) 40   SpO2: 99%   Weight: 119 lb (54 kg)    Wt Readings from Last 3 Encounters:  09/03/17 119 lb (54 kg)  08/13/17 120 lb 4 oz (54.5 kg)  06/11/17 125 lb (56.7 kg)    General: Elderly appearing. No resp difficulty. HEENT: Normal Neck: Supple. JVP flat. Carotids 2+ bilat; no bruits. No  thyromegaly or nodule noted. Cor: PMI nondisplaced. RRR, No M/G/R noted Lungs: CTAB, normal effort. Abdomen: Soft, non-tender, non-distended, no HSM. No bruits or masses. +BS  Extremities: No cyanosis, clubbing, or rash. R and LLE no edema.  Neuro: Alert & orientedx3, cranial nerves grossly intact. moves all 4 extremities w/o difficulty. Affect pleasant   Assessment/Plan: 1. Chronic systolic CHF: Ischemic cardiomyopathy.  Echo 1/19 with EF 20-25%. - Echo today shows EF 25-30%. Qualifies for ICD. Will refer to EP.  - NYHA II symptoms.  - Volume status dry with orthostatic hypotension.   - Hold spiro for 2 days then decrease to 12.5 mg qhs as previously ordered.  - Continue Coreg 3.125 mg bid.  - Stop Entresto. Will try back on low-dose losartan as tolerated at next visit.  - Hold torsemide for 3 days, then resume 20 mg daily.  - Continue compression hose.  - Reinforced fluid restriction to < 2 L daily, sodium restriction to less than 2000 mg daily, and the importance of daily weights.   2. CAD:  - No s/s of ischemia.    - Continue Plavix, ASA 81 daily. Should continue Plavix long-term.  - Continue simvastatin 40 daily with Zetia 10 daily. Lipids stable 08/13/17.  3. Diabetes:  - Per PCP. Should consider SGLT-2 inhibitor given CHF. Would need to wait until fluid status is addressed.  4. Atrial fibrillation:  - Paroxysmal, noted on post-op CABG.  Will need anticoagulation if it recurs.  5. Orthostatic hypotension - Meds as above.   Orthostatic on exam. Meds as above. Cutting back multiple meds. Labs today. Discussed all above with Dr. Loralie Champagne,  PA-C  09/03/2017  Greater than 50% of the 25 minute visit was spent in counseling/coordination of care regarding disease state education, salt/fluid restriction, sliding scale diuretics, and medication compliance.

## 2017-09-03 NOTE — Progress Notes (Signed)
  Echocardiogram 2D Echocardiogram has been performed.  Leta Jungling M 09/03/2017, 1:38 PM

## 2017-09-03 NOTE — Patient Instructions (Addendum)
Labs today (will call for abnormal results, otherwise no news is good news)  HOLD Spironolactone for TWO DAYS.  Then DECREASE dose to 12.5 (0.5 Tablet) Once Daily at night.  HOLD Torsemide for THREE DAYS.  Then on Friday DECREASE dose to 20 mg (1 Tablet) Once Daily.  Referral placed for you to go see EP for possible ICD placement.    Follow up in 2 weeks.

## 2017-09-13 ENCOUNTER — Encounter: Payer: Self-pay | Admitting: Internal Medicine

## 2017-09-13 ENCOUNTER — Ambulatory Visit: Payer: Medicare HMO | Admitting: Internal Medicine

## 2017-09-13 VITALS — BP 134/60 | HR 52 | Ht 62.0 in | Wt 123.0 lb

## 2017-09-13 DIAGNOSIS — I5042 Chronic combined systolic (congestive) and diastolic (congestive) heart failure: Secondary | ICD-10-CM | POA: Diagnosis not present

## 2017-09-13 DIAGNOSIS — I25708 Atherosclerosis of coronary artery bypass graft(s), unspecified, with other forms of angina pectoris: Secondary | ICD-10-CM

## 2017-09-13 DIAGNOSIS — I480 Paroxysmal atrial fibrillation: Secondary | ICD-10-CM | POA: Diagnosis not present

## 2017-09-13 NOTE — Progress Notes (Signed)
ELECTROPHYSIOLOGY CONSULT NOTE  Patient ID: [name], MRN: [id number], DOB/AGE: 74-Dec-1947 74 y.o. Admit date: (Not on file) Date of Consult: 09/13/2017  Primary Physician: Dorisann Frames, MD Primary Cardiologist: DM/KH     [name] is a 72 y.o. female who is being seen today for the evaluation of ICD at the request of Dr DM.    HPI [name] is a 72 y.o. female  Referred for consideration of an ICD.  She has a history of ischemic heart disease.  She presented with a silent MI and underwent bypass surgery 2008.  She had NSTEMI 1/19>> LHC  RCA 70/90% -- atherectomy.   DATE TEST EF   3/13 Echo   40.45 %   10/18 Echo   20 %   1/19 Echo  20 %   4/19 Echo  25%    Is a history of a left pleural effusion requiring thoracentesis.  Has long-standing diabetes and has required down titration and elimination of similar episode medications because of orthostatic intolerance.  Most of her exercise intolerance is manifested by dizziness not to my dyspnea.  She has had no recent chest discomfort; notably in this regard, neither of her MIs were associated with chest discomfort.  She does not have a history of syncope.    ECG 4/19 demonstrated frequent PVCs other ECGs back to 2016 failed to show that.   Past Medical History:  Diagnosis Date  . Cholelithiases   . Coronary artery disease 02/2007   CABG  . Diabetes (HCC)   . Dyslipidemia   . HTN (hypertension)   . Hypothyroid   . PAF (paroxysmal atrial fibrillation) (HCC)    post op  . S/P CABG x 4 2008      Surgical History:  Past Surgical History:  Procedure Laterality Date  . ABDOMINAL HYSTERECTOMY    . CARDIAC CATHETERIZATION  03/01/2007   LAD with 99% prox lesion, at birfucation of Cfx there is 99% lesion, at bifurcation of large OM1 there is 99% lesion, 70% prox lesion at ramus intermedius, 90% lesion in prox RCA (Dr. Claudia Desanctis) - susequent CABG  . CORONARY ARTERY BYPASS GRAFT  03/05/2007   LIMA-LAD,  SVG-ramus intermediate. SVG-OM, SVG-PDA (Dr. Donata Clay)  . CORONARY ATHERECTOMY N/A 05/17/2017   Procedure: CORONARY ATHERECTOMY;  Surgeon: Lennette Bihari, MD;  Location: [facility] INVASIVE CV LAB;  Service: Cardiovascular;  Laterality: N/A;  . CORONARY STENT INTERVENTION N/A 05/17/2017   Procedure: CORONARY STENT INTERVENTION;  Surgeon: Lennette Bihari, MD;  Location: [facility] INVASIVE CV LAB;  Service: Cardiovascular;  Laterality: N/A;  . INCONTINENCE SURGERY  2009  . IR ANGIOGRAM SELECTIVE EACH ADDITIONAL VESSEL  01/31/2017  . IR ANGIOGRAM SELECTIVE EACH ADDITIONAL VESSEL  01/31/2017  . IR ANGIOGRAM VISCERAL SELECTIVE  01/31/2017  . IR EMBO ART  VEN HEMORR LYMPH EXTRAV  INC GUIDE ROADMAPPING  01/31/2017  . IR THORACENTESIS ASP PLEURAL SPACE W/IMG GUIDE  05/11/2017  . IR THORACENTESIS ASP PLEURAL SPACE W/IMG GUIDE  05/18/2017  . IR US GUIDE VASC ACCESS RIGHT  01/31/2017  . LEFT HEART CATH AND CORS/GRAFTS ANGIOGRAPHY N/A 05/15/2017   Procedure: LEFT HEART CATH AND CORS/GRAFTS ANGIOGRAPHY;  Surgeon: Lennette Bihari, MD;  Location: [facility] INVASIVE CV LAB;  Service: Cardiovascular;  Laterality: N/A;  . LEFT HEART CATH AND CORS/GRAFTS ANGIOGRAPHY N/A 05/17/2017   Procedure: LEFT HEART CATH AND CORS/GRAFTS ANGIOGRAPHY;  Surgeon: Lennette Bihari, MD;  Location: [facility] INVASIVE CV LAB;  Service:  Cardiovascular;  Laterality: N/A;  . NM MYOCAR PERF WALL MOTION  07/2011   bruce myoview - mild perfusion defect in basal inferolateral & mid inferolateral regions (attenuation artifact, but prior infarct annote be excluded)' EF 49%; low risk  . TRANSTHORACIC ECHOCARDIOGRAM  07/2011   EF 45-50%, mild septal hypokinesis, mild inferior wall hypokinesis; RV systolc function mildly reduced; borderline LA enlargement; mild MR & TR; AV mildly sclerotic; mild pulm valve regurg      Home Meds: Prior to Admission medications   Medication Sig Start Date End Date Taking? Authorizing Provider  aspirin EC 81 MG tablet Take 1 tablet (81 mg total) by  mouth daily. 08/13/17   Laurey Morale, MD  carvedilol (COREG) 3.125 MG tablet Take 1 tablet (3.125 mg total) by mouth 2 (two) times daily with a meal. 08/13/17   Laurey Morale, MD  cholecalciferol (VITAMIN D) 1000 UNITS tablet Take 1,000 Units by mouth 2 (two) times daily.     [provider]  clopidogrel (PLAVIX) 75 MG tablet Take 1 tablet (75 mg total) by mouth daily with breakfast. 07/11/17   Hilty, Lisette Abu, MD  ezetimibe (ZETIA) 10 MG tablet Take 1 tablet (10 mg total) by mouth daily. 07/09/17 10/07/17  Jodelle Gross, NP  feeding supplement, ENSURE ENLIVE, (ENSURE ENLIVE) LIQD Take 237 mLs by mouth 3 (three) times daily between meals. 02/19/17   Meuth, Brooke A, PA-C  insulin aspart (NOVOLOG) 100 UNIT/ML injection Inject 0-15 Units into the skin 3 (three) times daily with meals. 02/19/17   Meuth, Brooke A, PA-C  insulin glargine (LANTUS) 100 UNIT/ML injection Inject 0.06 mLs (6 Units total) into the skin at bedtime. 05/20/17   Barnetta Chapel, MD  levothyroxine (SYNTHROID, LEVOTHROID) 50 MCG tablet Take 1 tablet (50 mcg total) by mouth daily. 07/11/17   Hilty, Lisette Abu, MD  NONFORMULARY OR COMPOUNDED ITEM [facility]:  Onychomycosis Nail Lacquer - Fluconazole 2%, Terbinafine 1%, DMSO, apply daily to affected area. 02/15/16   Vivi Barrack, DPM  sacubitril-valsartan (ENTRESTO) 24-26 MG Take 1 tablet by mouth 2 (two) times daily. 06/11/17   Jodelle Gross, NP  simvastatin (ZOCOR) 40 MG tablet Take 1 tablet (40 mg total) by mouth at bedtime. 07/11/17 10/09/17  Chrystie Nose, MD  spironolactone (ALDACTONE) 25 MG tablet Take 0.5 tablets (12.5 mg total) by mouth at bedtime. 09/03/17   Graciella Freer, PA-C  torsemide (DEMADEX) 20 MG tablet Take 1 tablet (20 mg total) by mouth daily. 09/03/17   Graciella Freer, PA-C  venlafaxine XR (EFFEXOR-XR) 150 MG 24 hr capsule Take 1 capsule (150 mg total) by mouth daily. 07/12/17   Hilty, Lisette Abu, MD  vitamin C (ASCORBIC  ACID) 500 MG tablet Take 500 mg by mouth daily.    [provider]    Allergies:  Allergies  Allergen Reactions  . Crestor [Rosuvastatin] Other (See Comments)    unknown  . Lipitor [Atorvastatin] Other (See Comments)    Very bad pain in hips and joints    Social History   Socioeconomic History  . Marital status: Married    Spouse name: Not on file  . Number of children: 1  . Years of education: 5  . Highest education level: Not on file  Occupational History    Employer: [employer]  Social Needs  . Financial resource strain: Not on file  . Food insecurity:    Worry: Not on file    Inability: Not on file  .  Transportation needs:    Medical: Not on file    Non-medical: Not on file  Tobacco Use  . Smoking status: Never Smoker  . Smokeless tobacco: Never Used  Substance and Sexual Activity  . Alcohol use: No  . Drug use: No  . Sexual activity: Never    Birth control/protection: Abstinence  Lifestyle  . Physical activity:    Days per week: Not on file    Minutes per session: Not on file  . Stress: Not on file  Relationships  . Social connections:    Talks on phone: Not on file    Gets together: Not on file    Attends religious service: Not on file    Active member of club or organization: Not on file    Attends meetings of clubs or organizations: Not on file    Relationship status: Not on file  . Intimate partner violence:    Fear of current or ex partner: Not on file    Emotionally abused: Not on file    Physically abused: Not on file    Forced sexual activity: Not on file  Other Topics Concern  . Not on file  Social History Narrative  . Not on file     Family History  Problem Relation Age of Onset  . Diabetes Mother 79  . Heart disease Mother   . Hyperlipidemia Mother   . Cancer Father   . Heart attack Father 61  . Cancer Maternal Grandmother   . Cancer Paternal Grandmother   . Thyroid disease Sister   . Diabetes Child      ROS:   Please see the history of present illness.     All other systems reviewed and negative.    Physical Exam: Blood pressure 134/60, pulse (!) 52, height  (1.575 m), weight 123 lb (55.8 kg), SpO2 97 %. General: Well developed, well nourished female in no acute distress. Head: Normocephalic, atraumatic, sclera non-icteric, no xanthomas, nares are without discharge. EENT: normal  Lymph Nodes:  none Neck: Negative for carotid bruits. JVD6-8 Back:without scoliosis kyphosis  Lungs: Clear bilaterally to auscultation without wheezes, rales, or rhonchi. Breathing is unlabored. Heart: Irregular RR with S1 S2. No   murmur . No rubs, or gallops appreciated. Abdomen: Soft, non-tender, non-distended with normoactive bowel sounds. No hepatomegaly. No rebound/guarding. No obvious abdominal masses. Msk:  Strength and tone appear normal for age. Extremities: No clubbing or cyanosis. No edema.  Distal pedal pulses are 2+ and equal bilaterally. Skin: Warm and Dry Neuro: Alert and oriented X 3. CN III-XII intact Grossly normal sensory and motor function . Psych:  Responds to questions appropriately with a normal affect.      Labs: Cardiac Enzymes No results for input(s): CKTOTAL, CKMB, TROPONINI in the last 72 hours. CBC Lab Results  Component Value Date   WBC 11.9 (H) 09/03/2017   HGB 10.7 (L) 09/03/2017   HCT 33.5 (L) 09/03/2017   MCV 88.9 09/03/2017   PLT 312 09/03/2017   PROTIME: No results for input(s): LABPROT, INR in the last 72 hours. Chemistry No results for input(s): NA, K, CL, CO2, BUN, CREATININE, CALCIUM, PROT, BILITOT, ALKPHOS, ALT, AST, GLUCOSE in the last 168 hours.  Invalid input(s): LABALBU Lipids Lab Results  Component Value Date   CHOL 122 08/13/2017   HDL 51 08/13/2017   LDLCALC 49 08/13/2017   TRIG 110 08/13/2017   BNP No results found for: PROBNP Thyroid Function Tests: No results for input(s): TSH, T4TOTAL, T3FREE,  THYROIDAB in the last 72 hours.  Invalid  input(s): FREET3 Miscellaneous No results found for: DDIMER  Radiology/Studies:  No results found.  EKG: Sinus with narrow QRS    Assessment and Plan:  Ischemic cardiomyopathy  Orthostatic hypotension  Diabetic neuropathy  CHF chronic systolic    The patient has persistent left ventricular dysfunction despite best efforts of guideline directed medical therapy.  Unfortunately this is been limited by orthostatic intolerance in the context of her long-standing diabetes and secondary neuropathy.  It may well be that compressive clothing i.e. thigh sleeves and/or abdominal binding might allow for adjunctive use of her guideline directed therapy so as to maximize medical benefits.  The role of an ICD in patients who cannot take medications is not well understood.  I think it is reasonable however to proceed with standing.  Have reviewed the potential benefits and risks of ICD implantation including but not limited to death, perforation of heart or lung, lead dislodgement, infection,  device malfunction and inappropriate shocks.  The patient express understanding  and are willing to proceed.       Sherryl Manges

## 2017-09-13 NOTE — H&P (View-Only) (Signed)
    ELECTROPHYSIOLOGY CONSULT NOTE  Patient ID: [name], MRN: [id number], DOB/AGE: 05/24/1945 71 y.o. Admit date: (Not on file) Date of Consult: 09/13/2017  Primary Physician: Balan, Bindubal, MD Primary Cardiologist: DM/KH     [name] is a 71 y.o. female who is being seen today for the evaluation of ICD at the request of Dr DM.    HPI [name] is a 71 y.o. female  Referred for consideration of an ICD.  She has a history of ischemic heart disease.  She presented with a silent MI and underwent bypass surgery 2008.  She had NSTEMI 1/19>> LHC  RCA 70/90% -- atherectomy.   DATE TEST EF   3/13 Echo   40.45 %   10/18 Echo   20 %   1/19 Echo  20 %   4/19 Echo  25%    Is a history of a left pleural effusion requiring thoracentesis.  Has long-standing diabetes and has required down titration and elimination of similar episode medications because of orthostatic intolerance.  Most of her exercise intolerance is manifested by dizziness not to my dyspnea.  She has had no recent chest discomfort; notably in this regard, neither of her MIs were associated with chest discomfort.  She does not have a history of syncope.    ECG 4/19 demonstrated frequent PVCs other ECGs back to 2016 failed to show that.   Past Medical History:  Diagnosis Date  . Cholelithiases   . Coronary artery disease 02/2007   CABG  . Diabetes (HCC)   . Dyslipidemia   . HTN (hypertension)   . Hypothyroid   . PAF (paroxysmal atrial fibrillation) (HCC)    post op  . S/P CABG x 4 2008      Surgical History:  Past Surgical History:  Procedure Laterality Date  . ABDOMINAL HYSTERECTOMY    . CARDIAC CATHETERIZATION  03/01/2007   LAD with 99% prox lesion, at birfucation of Cfx there is 99% lesion, at bifurcation of large OM1 there is 99% lesion, 70% prox lesion at ramus intermedius, 90% lesion in prox RCA (Dr. H. Solomon) - susequent CABG  . CORONARY ARTERY BYPASS GRAFT  03/05/2007   LIMA-LAD,  SVG-ramus intermediate. SVG-OM, SVG-PDA (Dr. Van Trigt)  . CORONARY ATHERECTOMY N/A 05/17/2017   Procedure: CORONARY ATHERECTOMY;  Surgeon: Kelly, Thomas A, MD;  Location: [facility];  Service: Cardiovascular;  Laterality: N/A;  . CORONARY STENT INTERVENTION N/A 05/17/2017   Procedure: CORONARY STENT INTERVENTION;  Surgeon: Kelly, Thomas A, MD;  Location: [facility];  Service: Cardiovascular;  Laterality: N/A;  . INCONTINENCE SURGERY  2009  . IR ANGIOGRAM SELECTIVE EACH ADDITIONAL VESSEL  01/31/2017  . IR ANGIOGRAM SELECTIVE EACH ADDITIONAL VESSEL  01/31/2017  . IR ANGIOGRAM VISCERAL SELECTIVE  01/31/2017  . IR EMBO ART  VEN HEMORR LYMPH EXTRAV  INC GUIDE ROADMAPPING  01/31/2017  . IR THORACENTESIS ASP PLEURAL SPACE W/IMG GUIDE  05/11/2017  . IR THORACENTESIS ASP PLEURAL SPACE W/IMG GUIDE  05/18/2017  . IR US GUIDE VASC ACCESS RIGHT  01/31/2017  . LEFT HEART CATH AND CORS/GRAFTS ANGIOGRAPHY N/A 05/15/2017   Procedure: LEFT HEART CATH AND CORS/GRAFTS ANGIOGRAPHY;  Surgeon: Kelly, Thomas A, MD;  Location: [facility];  Service: Cardiovascular;  Laterality: N/A;  . LEFT HEART CATH AND CORS/GRAFTS ANGIOGRAPHY N/A 05/17/2017   Procedure: LEFT HEART CATH AND CORS/GRAFTS ANGIOGRAPHY;  Surgeon: Kelly, Thomas A, MD;  Location: [facility];  Service:   Cardiovascular;  Laterality: N/A;  . NM MYOCAR PERF WALL MOTION  07/2011   bruce myoview - mild perfusion defect in basal inferolateral & mid inferolateral regions (attenuation artifact, but prior infarct annote be excluded)' EF 49%; low risk  . TRANSTHORACIC ECHOCARDIOGRAM  07/2011   EF 45-50%, mild septal hypokinesis, mild inferior wall hypokinesis; RV systolc function mildly reduced; borderline LA enlargement; mild MR & TR; AV mildly sclerotic; mild pulm valve regurg      Home Meds: Prior to Admission medications   Medication Sig Start Date End Date Taking? Authorizing Provider  aspirin EC 81 MG tablet Take 1 tablet (81 mg total) by  mouth daily. 08/13/17   McLean, Dalton S, MD  carvedilol (COREG) 3.125 MG tablet Take 1 tablet (3.125 mg total) by mouth 2 (two) times daily with a meal. 08/13/17   McLean, Dalton S, MD  cholecalciferol (VITAMIN D) 1000 UNITS tablet Take 1,000 Units by mouth 2 (two) times daily.     [provider]  clopidogrel (PLAVIX) 75 MG tablet Take 1 tablet (75 mg total) by mouth daily with breakfast. 07/11/17   Hilty, Kenneth C, MD  ezetimibe (ZETIA) 10 MG tablet Take 1 tablet (10 mg total) by mouth daily. 07/09/17 10/07/17  Lawrence, Kathryn M, NP  feeding supplement, ENSURE ENLIVE, (ENSURE ENLIVE) LIQD Take 237 mLs by mouth 3 (three) times daily between meals. 02/19/17   Meuth, Brooke A, PA-C  insulin aspart (NOVOLOG) 100 UNIT/ML injection Inject 0-15 Units into the skin 3 (three) times daily with meals. 02/19/17   Meuth, Brooke A, PA-C  insulin glargine (LANTUS) 100 UNIT/ML injection Inject 0.06 mLs (6 Units total) into the skin at bedtime. 05/20/17   Ogbata, Sylvester I, MD  levothyroxine (SYNTHROID, LEVOTHROID) 50 MCG tablet Take 1 tablet (50 mcg total) by mouth daily. 07/11/17   Hilty, Kenneth C, MD  NONFORMULARY OR COMPOUNDED ITEM [facility]:  Onychomycosis Nail Lacquer - Fluconazole 2%, Terbinafine 1%, DMSO, apply daily to affected area. 02/15/16   Wagoner, Matthew R, DPM  sacubitril-valsartan (ENTRESTO) 24-26 MG Take 1 tablet by mouth 2 (two) times daily. 06/11/17   Lawrence, Kathryn M, NP  simvastatin (ZOCOR) 40 MG tablet Take 1 tablet (40 mg total) by mouth at bedtime. 07/11/17 10/09/17  Hilty, Kenneth C, MD  spironolactone (ALDACTONE) 25 MG tablet Take 0.5 tablets (12.5 mg total) by mouth at bedtime. 09/03/17   Tillery, Michael Andrew, PA-C  torsemide (DEMADEX) 20 MG tablet Take 1 tablet (20 mg total) by mouth daily. 09/03/17   Tillery, Michael Andrew, PA-C  venlafaxine XR (EFFEXOR-XR) 150 MG 24 hr capsule Take 1 capsule (150 mg total) by mouth daily. 07/12/17   Hilty, Kenneth C, MD  vitamin C (ASCORBIC  ACID) 500 MG tablet Take 500 mg by mouth daily.    [provider]    Allergies:  Allergies  Allergen Reactions  . Crestor [Rosuvastatin] Other (See Comments)    unknown  . Lipitor [Atorvastatin] Other (See Comments)    Very bad pain in hips and joints    Social History   Socioeconomic History  . Marital status: Married    Spouse name: Not on file  . Number of children: 1  . Years of education: 13  . Highest education level: Not on file  Occupational History    Employer: [employer]  Social Needs  . Financial resource strain: Not on file  . Food insecurity:    Worry: Not on file    Inability: Not on file  .   Transportation needs:    Medical: Not on file    Non-medical: Not on file  Tobacco Use  . Smoking status: Never Smoker  . Smokeless tobacco: Never Used  Substance and Sexual Activity  . Alcohol use: No  . Drug use: No  . Sexual activity: Never    Birth control/protection: Abstinence  Lifestyle  . Physical activity:    Days per week: Not on file    Minutes per session: Not on file  . Stress: Not on file  Relationships  . Social connections:    Talks on phone: Not on file    Gets together: Not on file    Attends religious service: Not on file    Active member of club or organization: Not on file    Attends meetings of clubs or organizations: Not on file    Relationship status: Not on file  . Intimate partner violence:    Fear of current or ex partner: Not on file    Emotionally abused: Not on file    Physically abused: Not on file    Forced sexual activity: Not on file  Other Topics Concern  . Not on file  Social History Narrative  . Not on file     Family History  Problem Relation Age of Onset  . Diabetes Mother 61  . Heart disease Mother   . Hyperlipidemia Mother   . Cancer Father   . Heart attack Father 70  . Cancer Maternal Grandmother   . Cancer Paternal Grandmother   . Thyroid disease Sister   . Diabetes Child      ROS:   Please see the history of present illness.     All other systems reviewed and negative.    Physical Exam: Blood pressure 134/60, pulse (!) 52, height 5' 2" (1.575 m), weight 123 lb (55.8 kg), SpO2 97 %. General: Well developed, well nourished female in no acute distress. Head: Normocephalic, atraumatic, sclera non-icteric, no xanthomas, nares are without discharge. EENT: normal  Lymph Nodes:  none Neck: Negative for carotid bruits. JVD6-8 Back:without scoliosis kyphosis  Lungs: Clear bilaterally to auscultation without wheezes, rales, or rhonchi. Breathing is unlabored. Heart: Irregular RR with S1 S2. No   murmur . No rubs, or gallops appreciated. Abdomen: Soft, non-tender, non-distended with normoactive bowel sounds. No hepatomegaly. No rebound/guarding. No obvious abdominal masses. Msk:  Strength and tone appear normal for age. Extremities: No clubbing or cyanosis. No edema.  Distal pedal pulses are 2+ and equal bilaterally. Skin: Warm and Dry Neuro: Alert and oriented X 3. CN III-XII intact Grossly normal sensory and motor function . Psych:  Responds to questions appropriately with a normal affect.      Labs: Cardiac Enzymes No results for input(s): CKTOTAL, CKMB, TROPONINI in the last 72 hours. CBC Lab Results  Component Value Date   WBC 11.9 (H) 09/03/2017   HGB 10.7 (L) 09/03/2017   HCT 33.5 (L) 09/03/2017   MCV 88.9 09/03/2017   PLT 312 09/03/2017   PROTIME: No results for input(s): LABPROT, INR in the last 72 hours. Chemistry No results for input(s): NA, K, CL, CO2, BUN, CREATININE, CALCIUM, PROT, BILITOT, ALKPHOS, ALT, AST, GLUCOSE in the last 168 hours.  Invalid input(s): LABALBU Lipids Lab Results  Component Value Date   CHOL 122 08/13/2017   HDL 51 08/13/2017   LDLCALC 49 08/13/2017   TRIG 110 08/13/2017   BNP No results found for: PROBNP Thyroid Function Tests: No results for input(s): TSH, T4TOTAL, T3FREE,   THYROIDAB in the last 72 hours.  Invalid  input(s): FREET3 Miscellaneous No results found for: DDIMER  Radiology/Studies:  No results found.  EKG: Sinus with narrow QRS    Assessment and Plan:  Ischemic cardiomyopathy  Orthostatic hypotension  Diabetic neuropathy  CHF chronic systolic    The patient has persistent left ventricular dysfunction despite best efforts of guideline directed medical therapy.  Unfortunately this is been limited by orthostatic intolerance in the context of her long-standing diabetes and secondary neuropathy.  It may well be that compressive clothing i.e. thigh sleeves and/or abdominal binding might allow for adjunctive use of her guideline directed therapy so as to maximize medical benefits.  The role of an ICD in patients who cannot take medications is not well understood.  I think it is reasonable however to proceed with standing.  Have reviewed the potential benefits and risks of ICD implantation including but not limited to death, perforation of heart or lung, lead dislodgement, infection,  device malfunction and inappropriate shocks.  The patient express understanding  and are willing to proceed.       [place] Welcome  

## 2017-09-13 NOTE — Patient Instructions (Signed)
Medication Instructions:  Your physician recommends that you continue on your current medications as directed. Please refer to the Current Medication list given to you today.  Labwork: Your physician recommends that you return for lab work between May 20th and May 30th. Please call me to schedule a day.   Testing/Procedures: Your physician has recommended that you have a defibrillator inserted. An implantable cardioverter defibrillator (ICD) is a small device that is placed in your chest or, in rare cases, your abdomen. This device uses electrical pulses or shocks to help control life-threatening, irregular heartbeats that could lead the heart to suddenly stop beating (sudden cardiac arrest). Leads are attached to the ICD that goes into your heart. This is done in the hospital and usually requires an overnight stay. Please see the instruction sheet given to you today for more information.   Follow-Up: Your physician recommends that you schedule a follow-up appointment in:   10-14 days after your procedure with the device clinic for a wound check 91 days after your procedure with Dr Graciela Husbands  Any Other Special Instructions Will Be Listed Below (If Applicable).     If you need a refill on your cardiac medications before your next appointment, please call your pharmacy.

## 2017-09-17 ENCOUNTER — Ambulatory Visit (HOSPITAL_COMMUNITY)
Admission: RE | Admit: 2017-09-17 | Discharge: 2017-09-17 | Disposition: A | Discharge status: Home / Self Care | Payer: Medicare HMO | Source: Ambulatory Visit | Attending: Cardiology | Admitting: Cardiology

## 2017-09-17 VITALS — BP 122/62 | HR 86 | Wt 122.8 lb

## 2017-09-17 DIAGNOSIS — Z951 Presence of aortocoronary bypass graft: Secondary | ICD-10-CM | POA: Diagnosis not present

## 2017-09-17 DIAGNOSIS — I2510 Atherosclerotic heart disease of native coronary artery without angina pectoris: Secondary | ICD-10-CM | POA: Diagnosis not present

## 2017-09-17 DIAGNOSIS — I480 Paroxysmal atrial fibrillation: Secondary | ICD-10-CM | POA: Diagnosis not present

## 2017-09-17 DIAGNOSIS — Z833 Family history of diabetes mellitus: Secondary | ICD-10-CM | POA: Insufficient documentation

## 2017-09-17 DIAGNOSIS — Z79899 Other long term (current) drug therapy: Secondary | ICD-10-CM | POA: Insufficient documentation

## 2017-09-17 DIAGNOSIS — I5022 Chronic systolic (congestive) heart failure: Secondary | ICD-10-CM | POA: Diagnosis not present

## 2017-09-17 DIAGNOSIS — Z7902 Long term (current) use of antithrombotics/antiplatelets: Secondary | ICD-10-CM | POA: Diagnosis not present

## 2017-09-17 DIAGNOSIS — Z7982 Long term (current) use of aspirin: Secondary | ICD-10-CM | POA: Insufficient documentation

## 2017-09-17 DIAGNOSIS — R2689 Other abnormalities of gait and mobility: Secondary | ICD-10-CM

## 2017-09-17 DIAGNOSIS — Z794 Long term (current) use of insulin: Secondary | ICD-10-CM | POA: Diagnosis not present

## 2017-09-17 DIAGNOSIS — E119 Type 2 diabetes mellitus without complications: Secondary | ICD-10-CM | POA: Insufficient documentation

## 2017-09-17 DIAGNOSIS — Z8249 Family history of ischemic heart disease and other diseases of the circulatory system: Secondary | ICD-10-CM | POA: Diagnosis not present

## 2017-09-17 DIAGNOSIS — I110 Hypertensive heart disease with heart failure: Secondary | ICD-10-CM | POA: Diagnosis not present

## 2017-09-17 DIAGNOSIS — I255 Ischemic cardiomyopathy: Secondary | ICD-10-CM | POA: Diagnosis not present

## 2017-09-17 DIAGNOSIS — I25708 Atherosclerosis of coronary artery bypass graft(s), unspecified, with other forms of angina pectoris: Secondary | ICD-10-CM | POA: Diagnosis not present

## 2017-09-17 DIAGNOSIS — E785 Hyperlipidemia, unspecified: Secondary | ICD-10-CM | POA: Insufficient documentation

## 2017-09-17 DIAGNOSIS — R0989 Other specified symptoms and signs involving the circulatory and respiratory systems: Secondary | ICD-10-CM | POA: Diagnosis not present

## 2017-09-17 LAB — BASIC METABOLIC PANEL
Anion gap: 8 (ref 5–15)
BUN: 26 mg/dL — AB (ref 6–20)
CALCIUM: 9.9 mg/dL (ref 8.9–10.3)
CHLORIDE: 94 mmol/L — AB (ref 101–111)
CO2: 32 mmol/L (ref 22–32)
CREATININE: 1.2 mg/dL — AB (ref 0.44–1.00)
GFR calc non Af Amer: 44 mL/min — ABNORMAL LOW (ref >60)
GFR, EST AFRICAN AMERICAN: 51 mL/min — AB (ref >60)
Glucose, Bld: 373 mg/dL — ABNORMAL HIGH (ref 65–99)
Potassium: 5.8 mmol/L — ABNORMAL HIGH (ref 3.5–5.1)
SODIUM: 134 mmol/L — AB (ref 135–145)

## 2017-09-17 MED ORDER — SPIRONOLACTONE 25 MG PO TABS: 25.0000 mg | ORAL_TABLET | Freq: Every day | ORAL | 3 refills | Status: DC

## 2017-09-17 NOTE — Patient Instructions (Signed)
Routine lab work today. Will notify you of abnormal results, otherwise no news is good news!  INCREASE Spironolactone to 25 mg (1 whole tablet) once daily.  Will schedule you for vascular study at [facility] office. Address: [address], [place], [place] [address]  Phone: [phone]  Will refer you to physical therapy. They will call you to schedule initial appointment.  Follow up as scheduled with Dr. Shirlee Latch.  Take all medication as prescribed the day of your appointment. Bring all medications with you to your appointment.  Do the following things EVERYDAY: 1) Weigh yourself in the morning before breakfast. Write it down and keep it in a log. 2) Take your medicines as prescribed 3) Eat low salt foods-Limit salt (sodium) to 2000 mg per day.  4) Stay as active as you can everyday 5) Limit all fluids for the day to less than 2 liters

## 2017-09-17 NOTE — Progress Notes (Signed)
Advanced Heart Failure Clinic Note  PCP: Dorisann Frames, MD Cardiology: Dr. Rennis Golden HF Cardiology: Dr. Shirlee Latch  72 yo with history of CAD and ischemic cardiomyopathy presents for followup of CAD and CHF.  She had CABG x 4 in 2008.  Echo in 3/13 was 40-45%.  By 10/18, EF had dropped to 20% by echo.    She was admitted in 1/19 with CHF exacerbation/NSTEMI.  LHC showed occluded SVG-PDA and serial 70% and 90% stenoses in the proximal to mid RCA.  She had atherectomy with DES x 2 to RCA.  Echo in 1/19 with EF 20-25%.  She was diuresed and had left thoracentesis for pleural effusion.   She presents today for HF follow up. Last visit Entresto was DC'd, spiro was cut back, and torsemide was held for a few days for orthostasis and AKI. Overall doing better. Dizziness is much improved. It still occurs at times with fast movements and with sitting to standing. She had a fall last week while bending over. She did not hit her head and was not dizzy prior. She denies SOB with stairs and hills, but is not very active. Denies orthopnea/PND/edema. No CP or palpitations. Energy level is starting to improve. Good appetite. Usually limits salt, but had [employer] yesterday. She limits fluid intake <2 L. Weights ~120 lbs at home. Compliant with medications.   ECG 06/11/17: NSR, nonspecific T wave flattening EKG 09/17/17: SR with PVCs and PACs, 73 bpm  Labs (1/19): K 3.9, creatinine 0.88 Labs (4/19): K 5.1, Creatinine 1.31  PMH: 1. CAD: s/p CABG x 4 in 2008.  - NSTEMI 1/19 with LHC showing patent LIMA-LAD, 80% proximal LAD, 70% proximal RCA, 90% mid RCA, SVG-ramus patent, SVG-OM patent, SVG-PDA occluded.  Patient had atherectomy with DES x 2 to prox and mid RCA.   2. Chronic systolic CHF: Ischemic cardiomyopathy.  - Echo (3/13): EF 45-50%.  - Echo (10/18): EF 20%.  - Echo (1/19): EF 20-25%, diffuse hypokinesis, mild LV dilation.  3. HTN 4. H/o left pleural effusion 5. Hyperlipidemia 6. Type II diabetes 7. Atrial  fibrillation: Paroxysmal, noted only post-op CABG.  8. Hypothyroidism 9. Depression  Review of systems complete and found to be negative unless listed in HPI.   Social History   Socioeconomic History  . Marital status: Married    Spouse name: Not on file  . Number of children: 1  . Years of education: 60  . Highest education level: Not on file  Occupational History    Employer: [employer]  Social Needs  . Financial resource strain: Not on file  . Food insecurity:    Worry: Not on file    Inability: Not on file  . Transportation needs:    Medical: Not on file    Non-medical: Not on file  Tobacco Use  . Smoking status: Never Smoker  . Smokeless tobacco: Never Used  Substance and Sexual Activity  . Alcohol use: No  . Drug use: No  . Sexual activity: Never    Birth control/protection: Abstinence  Lifestyle  . Physical activity:    Days per week: Not on file    Minutes per session: Not on file  . Stress: Not on file  Relationships  . Social connections:    Talks on phone: Not on file    Gets together: Not on file    Attends religious service: Not on file    Active member of club or organization: Not on file    Attends meetings of  clubs or organizations: Not on file    Relationship status: Not on file  . Intimate partner violence:    Fear of current or ex partner: Not on file    Emotionally abused: Not on file    Physically abused: Not on file    Forced sexual activity: Not on file  Other Topics Concern  . Not on file  Social History Narrative  . Not on file   Family History  Problem Relation Age of Onset  . Diabetes Mother 36  . Heart disease Mother   . Hyperlipidemia Mother   . Cancer Father   . Heart attack Father 66  . Cancer Maternal Grandmother   . Cancer Paternal Grandmother   . Thyroid disease Sister   . Diabetes Child     Current Outpatient Medications  Medication Sig Dispense Refill  . aspirin EC 81 MG tablet Take 1 tablet (81 mg total)  by mouth daily. 90 tablet 3  . carvedilol (COREG) 3.125 MG tablet Take 1 tablet (3.125 mg total) by mouth 2 (two) times daily with a meal. 180 tablet 3  . cholecalciferol (VITAMIN D) 1000 UNITS tablet Take 1,000 Units by mouth 2 (two) times daily.     . clopidogrel (PLAVIX) 75 MG tablet Take 1 tablet (75 mg total) by mouth daily with breakfast. 90 tablet 1  . ezetimibe (ZETIA) 10 MG tablet Take 1 tablet (10 mg total) by mouth daily. 90 tablet 3  . feeding supplement, ENSURE ENLIVE, (ENSURE ENLIVE) LIQD Take 237 mLs by mouth 3 (three) times daily between meals. 237 mL 12  . insulin aspart (NOVOLOG) 100 UNIT/ML injection Inject 0-15 Units into the skin 3 (three) times daily with meals. 10 mL 11  . insulin glargine (LANTUS) 100 UNIT/ML injection Inject 0.06 mLs (6 Units total) into the skin at bedtime. 10 mL 11  . levothyroxine (SYNTHROID, LEVOTHROID) 50 MCG tablet Take 1 tablet (50 mcg total) by mouth daily. 90 tablet 3  . NONFORMULARY OR COMPOUNDED ITEM [facility]:  Onychomycosis Nail Lacquer - Fluconazole 2%, Terbinafine 1%, DMSO, apply daily to affected area. 480 each 11  . sacubitril-valsartan (ENTRESTO) 24-26 MG Take 1 tablet by mouth 2 (two) times daily. 60 tablet 5  . simvastatin (ZOCOR) 40 MG tablet Take 1 tablet (40 mg total) by mouth at bedtime. 90 tablet 3  . spironolactone (ALDACTONE) 25 MG tablet Take 0.5 tablets (12.5 mg total) by mouth at bedtime. 45 tablet 3  . torsemide (DEMADEX) 20 MG tablet Take 1 tablet (20 mg total) by mouth daily. 90 tablet 3  . venlafaxine XR (EFFEXOR-XR) 150 MG 24 hr capsule Take 1 capsule (150 mg total) by mouth daily. 90 capsule 2  . vitamin C (ASCORBIC ACID) 500 MG tablet Take 500 mg by mouth daily.     No current facility-administered medications for this encounter.    Vitals:   09/17/17 1512 09/17/17 1513  BP: 122/68 122/62  Pulse: 86   SpO2: 97%   Weight: 122 lb 12.8 oz (55.7 kg)    Wt Readings from Last 3 Encounters:  09/17/17 122 lb  12.8 oz (55.7 kg)  09/13/17 123 lb (55.8 kg)  09/03/17 119 lb (54 kg)    General: Elderly appearing. No resp difficulty. Walked in with cane.  HEENT: Normal Neck: Supple. JVP ~8. Carotids 2+ bilat; no bruits. No thyromegaly or nodule noted. Cor: PMI nondisplaced. IRR, No M/G/R noted Lungs: CTAB, normal effort. Abdomen: Soft, non-tender, non-distended, no HSM. No bruits or masses. +  BS  Extremities: No cyanosis, clubbing, or rash. R and LLE nonpitting edema. Cold extremities. diminished pedal pulses Neuro: Alert & orientedx3, cranial nerves grossly intact. moves all 4 extremities w/o difficulty. Affect pleasant  Assessment/Plan: 1. Chronic systolic CHF: Ischemic cardiomyopathy.  Echo 1/19 with EF 20-25%. - Echo 09/03/17: EF 25-30%.  - NYHA II symptoms.  - Volume status mildly elevated. Continue torsemide 20 mg daily.  - Increase spiro to 25 mg daily. BMET today and at follow up on 5/28 - Continue Coreg 3.125 mg bid.  - Consider low dose losartan at next visit. - Continue compression hose.  - Reinforced fluid restriction to < 2 L daily, sodium restriction to less than 2000 mg daily, and the importance of daily weights.   - Planned ICD for 5/31. 2. CAD:  - No s/s ischemia.  - Continue Plavix, ASA 81 daily. Should continue Plavix long-term.  - Continue simvastatin 40 daily with Zetia 10 daily. Lipids stable 08/13/17.  3. Diabetes:  - Per PCP. Consider SGLT-2 inhibitor given CHF. 4. Atrial fibrillation:  - Paroxysmal, noted on post-op CABG.  Will need anticoagulation if it recurs.  - EKG today shows NSR with PVCs and PACs 5. Orthostatic hypotension/dizziness - Resolved. Orthostatics negative today.  - Refer to PT to help with balance and ?vertigo 6. Cold extremities - Will recheck ABI's. Last was in 2016  BMET today Increase spiro to 25 mg daily Refer to PT for blance Schedule ABIs Keep follow up with Dr Shirlee Latch on 5/28   Alford Highland, NP  09/17/2017  Greater than 50% of the  25 minute visit was spent in counseling/coordination of care regarding disease state education, salt/fluid restriction, sliding scale diuretics, and medication compliance.

## 2017-09-18 ENCOUNTER — Telehealth (HOSPITAL_COMMUNITY): Payer: Self-pay

## 2017-09-18 DIAGNOSIS — I5022 Chronic systolic (congestive) heart failure: Secondary | ICD-10-CM

## 2017-09-18 NOTE — Telephone Encounter (Signed)
Result Notes for Basic metabolic panel   Notes recorded by Chyrl Civatte, RN on 09/18/2017 at 1:46 PM EDT Patient aware, lab scheduled for next friday ------  Notes recorded by Georgina Peer, RN on 09/18/2017 at 1:16 PM EDT Called home again and no answer and no VM. I called and left another VM on patient's daughter's phone asking to have her call us back. ------  Notes recorded by Georgina Peer, RN on 09/18/2017 at 10:07 AM EDT Called and spoke with patient's husband, he does not help her with her medications and stated she was still sleeping. I asked him to have her call us back before she takes her morning meds. ------  Notes recorded by Georgina Peer, RN on 09/18/2017 at 8:40 AM EDT Called both lines and spoke with daughter who does not live with patient. She stated patient doesn't get up until 10:30 and to try calling her back then. She stated patient does eat a lot of banana's, we'll try calling patient back later this morning. ------  Notes recorded by Alford Highland, NP on 09/18/2017 at 7:29 AM EDT Please call and have her stop spiro. Make sure she is not taking potassium supplement and that she is definitely not taking Entresto (DC'd previous visit). She should also avoid high potassium foods, like strawberries, bananas, potatoes, tomatoes. She needs a repeat BMET Thursday or Friday.

## 2017-09-18 NOTE — Telephone Encounter (Signed)
Result Notes for Basic metabolic panel   Notes recorded by Georgina Peer, RN on 09/18/2017 at 3:33 PM EDT Called and have her scheduled for this Thursday 5/16.

## 2017-09-20 ENCOUNTER — Other Ambulatory Visit (HOSPITAL_COMMUNITY): Payer: Medicare HMO

## 2017-09-28 ENCOUNTER — Ambulatory Visit (HOSPITAL_COMMUNITY)
Admission: RE | Admit: 2017-09-28 | Discharge: 2017-09-28 | Disposition: A | Discharge status: Home / Self Care | Payer: Medicare HMO | Source: Ambulatory Visit | Attending: Cardiology | Admitting: Cardiology

## 2017-09-28 ENCOUNTER — Other Ambulatory Visit (HOSPITAL_COMMUNITY): Payer: Self-pay | Admitting: Cardiology

## 2017-09-28 ENCOUNTER — Other Ambulatory Visit: Payer: Medicare HMO | Admitting: *Deleted

## 2017-09-28 DIAGNOSIS — I25708 Atherosclerosis of coronary artery bypass graft(s), unspecified, with other forms of angina pectoris: Secondary | ICD-10-CM | POA: Diagnosis not present

## 2017-09-28 DIAGNOSIS — R9389 Abnormal findings on diagnostic imaging of other specified body structures: Secondary | ICD-10-CM | POA: Diagnosis not present

## 2017-09-28 DIAGNOSIS — R200 Anesthesia of skin: Secondary | ICD-10-CM

## 2017-09-28 DIAGNOSIS — R202 Paresthesia of skin: Secondary | ICD-10-CM | POA: Diagnosis not present

## 2017-09-28 DIAGNOSIS — I2510 Atherosclerotic heart disease of native coronary artery without angina pectoris: Secondary | ICD-10-CM | POA: Insufficient documentation

## 2017-09-28 DIAGNOSIS — E785 Hyperlipidemia, unspecified: Secondary | ICD-10-CM | POA: Diagnosis not present

## 2017-09-28 DIAGNOSIS — I480 Paroxysmal atrial fibrillation: Secondary | ICD-10-CM

## 2017-09-28 DIAGNOSIS — E1140 Type 2 diabetes mellitus with diabetic neuropathy, unspecified: Secondary | ICD-10-CM | POA: Diagnosis not present

## 2017-09-28 DIAGNOSIS — I5042 Chronic combined systolic (congestive) and diastolic (congestive) heart failure: Secondary | ICD-10-CM | POA: Diagnosis not present

## 2017-09-28 DIAGNOSIS — I10 Essential (primary) hypertension: Secondary | ICD-10-CM | POA: Diagnosis not present

## 2017-09-29 LAB — CBC WITH DIFFERENTIAL/PLATELET
Basophils Absolute: 0.1 10*3/uL (ref 0.0–0.2)
Basos: 0 %
EOS (ABSOLUTE): 0.4 10*3/uL (ref 0.0–0.4)
EOS: 3 %
Hematocrit: 30.5 % — ABNORMAL LOW (ref 34.0–46.6)
Hemoglobin: 9.8 g/dL — ABNORMAL LOW (ref 11.1–15.9)
IMMATURE GRANULOCYTES: 0 %
Immature Grans (Abs): 0 10*3/uL (ref 0.0–0.1)
LYMPHS ABS: 2.5 10*3/uL (ref 0.7–3.1)
Lymphs: 18 %
MCH: 29.4 pg (ref 26.6–33.0)
MCHC: 32.1 g/dL (ref 31.5–35.7)
MCV: 92 fL (ref 79–97)
MONOCYTES: 9 %
MONOS ABS: 1.3 10*3/uL — AB (ref 0.1–0.9)
NEUTROS ABS: 9.6 10*3/uL — AB (ref 1.4–7.0)
Neutrophils: 70 %
PLATELETS: 373 10*3/uL (ref 150–450)
RBC: 3.33 x10E6/uL — AB (ref 3.77–5.28)
RDW: 14.9 % (ref 12.3–15.4)
WBC: 13.9 10*3/uL — ABNORMAL HIGH (ref 3.4–10.8)

## 2017-09-29 LAB — BASIC METABOLIC PANEL
BUN/Creatinine Ratio: 20 (ref 12–28)
BUN: 24 mg/dL (ref 8–27)
CHLORIDE: 97 mmol/L (ref 96–106)
CO2: 24 mmol/L (ref 20–29)
CREATININE: 1.21 mg/dL — AB (ref 0.57–1.00)
Calcium: 9.9 mg/dL (ref 8.7–10.3)
GFR calc non Af Amer: 44 mL/min/{1.73_m2} — ABNORMAL LOW (ref >59)
GFR, EST AFRICAN AMERICAN: 51 mL/min/{1.73_m2} — AB (ref >59)
Glucose: 278 mg/dL — ABNORMAL HIGH (ref 65–99)
Potassium: 5.5 mmol/L — ABNORMAL HIGH (ref 3.5–5.2)
Sodium: 137 mmol/L (ref 134–144)

## 2017-10-02 ENCOUNTER — Other Ambulatory Visit: Payer: Self-pay

## 2017-10-02 ENCOUNTER — Encounter (HOSPITAL_COMMUNITY): Payer: Self-pay | Admitting: Cardiology

## 2017-10-02 ENCOUNTER — Ambulatory Visit (HOSPITAL_COMMUNITY)
Admission: RE | Admit: 2017-10-02 | Discharge: 2017-10-02 | Disposition: A | Discharge status: Home / Self Care | Payer: Medicare HMO | Source: Ambulatory Visit | Attending: Cardiology | Admitting: Cardiology

## 2017-10-02 VITALS — BP 100/57 | HR 78 | Wt 124.5 lb

## 2017-10-02 DIAGNOSIS — F329 Major depressive disorder, single episode, unspecified: Secondary | ICD-10-CM | POA: Diagnosis not present

## 2017-10-02 DIAGNOSIS — E039 Hypothyroidism, unspecified: Secondary | ICD-10-CM | POA: Diagnosis not present

## 2017-10-02 DIAGNOSIS — N183 Chronic kidney disease, stage 3 (moderate): Secondary | ICD-10-CM | POA: Diagnosis not present

## 2017-10-02 DIAGNOSIS — I25708 Atherosclerosis of coronary artery bypass graft(s), unspecified, with other forms of angina pectoris: Secondary | ICD-10-CM | POA: Diagnosis not present

## 2017-10-02 DIAGNOSIS — I493 Ventricular premature depolarization: Secondary | ICD-10-CM | POA: Insufficient documentation

## 2017-10-02 DIAGNOSIS — E1122 Type 2 diabetes mellitus with diabetic chronic kidney disease: Secondary | ICD-10-CM | POA: Diagnosis not present

## 2017-10-02 DIAGNOSIS — I480 Paroxysmal atrial fibrillation: Secondary | ICD-10-CM | POA: Insufficient documentation

## 2017-10-02 DIAGNOSIS — I951 Orthostatic hypotension: Secondary | ICD-10-CM | POA: Diagnosis not present

## 2017-10-02 DIAGNOSIS — E785 Hyperlipidemia, unspecified: Secondary | ICD-10-CM | POA: Insufficient documentation

## 2017-10-02 DIAGNOSIS — I1310 Hypertensive heart and chronic kidney disease without heart failure, with stage 1 through stage 4 chronic kidney disease, or unspecified chronic kidney disease: Secondary | ICD-10-CM | POA: Diagnosis not present

## 2017-10-02 DIAGNOSIS — Z7902 Long term (current) use of antithrombotics/antiplatelets: Secondary | ICD-10-CM | POA: Diagnosis not present

## 2017-10-02 DIAGNOSIS — Z79899 Other long term (current) drug therapy: Secondary | ICD-10-CM | POA: Insufficient documentation

## 2017-10-02 DIAGNOSIS — Z794 Long term (current) use of insulin: Secondary | ICD-10-CM | POA: Diagnosis not present

## 2017-10-02 DIAGNOSIS — I252 Old myocardial infarction: Secondary | ICD-10-CM | POA: Insufficient documentation

## 2017-10-02 DIAGNOSIS — E119 Type 2 diabetes mellitus without complications: Secondary | ICD-10-CM | POA: Insufficient documentation

## 2017-10-02 DIAGNOSIS — Z79890 Hormone replacement therapy (postmenopausal): Secondary | ICD-10-CM | POA: Diagnosis not present

## 2017-10-02 DIAGNOSIS — Z951 Presence of aortocoronary bypass graft: Secondary | ICD-10-CM | POA: Diagnosis not present

## 2017-10-02 DIAGNOSIS — I2510 Atherosclerotic heart disease of native coronary artery without angina pectoris: Secondary | ICD-10-CM | POA: Insufficient documentation

## 2017-10-02 DIAGNOSIS — I5022 Chronic systolic (congestive) heart failure: Secondary | ICD-10-CM | POA: Insufficient documentation

## 2017-10-02 DIAGNOSIS — Z7982 Long term (current) use of aspirin: Secondary | ICD-10-CM | POA: Diagnosis not present

## 2017-10-02 DIAGNOSIS — I255 Ischemic cardiomyopathy: Secondary | ICD-10-CM | POA: Insufficient documentation

## 2017-10-02 LAB — BASIC METABOLIC PANEL
Anion gap: 10 (ref 5–15)
BUN: 25 mg/dL — ABNORMAL HIGH (ref 6–20)
CALCIUM: 9.8 mg/dL (ref 8.9–10.3)
CO2: 29 mmol/L (ref 22–32)
CREATININE: 1.13 mg/dL — AB (ref 0.44–1.00)
Chloride: 97 mmol/L — ABNORMAL LOW (ref 101–111)
GFR calc non Af Amer: 47 mL/min — ABNORMAL LOW (ref >60)
GFR, EST AFRICAN AMERICAN: 54 mL/min — AB (ref >60)
Glucose, Bld: 248 mg/dL — ABNORMAL HIGH (ref 65–99)
Potassium: 5 mmol/L (ref 3.5–5.1)
SODIUM: 136 mmol/L (ref 135–145)

## 2017-10-02 NOTE — Patient Instructions (Signed)
Your physician has recommended that you wear a holter monitor. Holter monitors are medical devices that record the heart's electrical activity. Doctors most often use these monitors to diagnose arrhythmias. Arrhythmias are problems with the speed or rhythm of the heartbeat. The monitor is a small, portable device. You can wear one while you do your normal daily activities. This is usually used to diagnose what is causing palpitations/syncope (passing out).  Labs drawn today (if we do not call you, then your lab work was stable)   Your physician recommends that you schedule a follow-up appointment in: 6 weeks with APP

## 2017-10-03 NOTE — Progress Notes (Signed)
Advanced Heart Failure Clinic Note  PCP: Dorisann Frames, MD Cardiology: Dr. Rennis Golden HF Cardiology: Dr. Shirlee Latch  72 yo with history of CAD and ischemic cardiomyopathy presents for followup of CAD and CHF.  She had CABG x 4 in 2008.  Echo in 3/13 was 40-45%.  By 10/18, EF had dropped to 20% by echo.    She was admitted in 1/19 with CHF exacerbation/NSTEMI.  LHC showed occluded SVG-PDA and serial 70% and 90% stenoses in the proximal to mid RCA.  She had atherectomy with DES x 2 to RCA.  Echo in 1/19 with EF 20-25%.  She was diuresed and had left thoracentesis for pleural effusion.   She developed lightheadedness/orthostasis and AKI with hyperkalemia. Entresto and spironolactone were stopped.   She presents today for followup of CHF.  She still has lightheaded spells when standing but no falls.  She uses a cane.  BP is better.  Symptoms overall improved with higher BP. She is short of breath carrying groceries or walking up the stairs. She does ok walking on flat ground.  No orthopnea/PND.  No chest pain.  No palpitations but frequent PVCs on past ECGs.    Labs (1/19): K 3.9, creatinine 0.88 Labs (4/19): K 5.1, Creatinine 1.31 Labs (5/19): K 5.8 => 5.5, creatinine 1.21  PMH: 1. CAD: s/p CABG x 4 in 2008.  - NSTEMI 1/19 with LHC showing patent LIMA-LAD, 80% proximal LAD, 70% proximal RCA, 90% mid RCA, SVG-ramus patent, SVG-OM patent, SVG-PDA occluded.  Patient had atherectomy with DES x 2 to prox and mid RCA.   2. Chronic systolic CHF: Ischemic cardiomyopathy.  - Echo (3/13): EF 45-50%.  - Echo (10/18): EF 20%.  - Echo (1/19): EF 20-25%, diffuse hypokinesis, mild LV dilation.  - Echo (4/19): EF 25-30%.  3. HTN 4. H/o left pleural effusion 5. Hyperlipidemia 6. Type II diabetes 7. Atrial fibrillation: Paroxysmal, noted only post-op CABG.  8. Hypothyroidism 9. Depression 10. Hyperkalemia with spironolactone.  11. PAD: ABIs 5/19 with normal ABI on right, left noncompressible.   Review of  systems complete and found to be negative unless listed in HPI.   Social History   Socioeconomic History  . Marital status: Married    Spouse name: Not on file  . Number of children: 1  . Years of education: 27  . Highest education level: Not on file  Occupational History    Employer: [employer]  Social Needs  . Financial resource strain: Not on file  . Food insecurity:    Worry: Not on file    Inability: Not on file  . Transportation needs:    Medical: Not on file    Non-medical: Not on file  Tobacco Use  . Smoking status: Never Smoker  . Smokeless tobacco: Never Used  Substance and Sexual Activity  . Alcohol use: No  . Drug use: No  . Sexual activity: Never    Birth control/protection: Abstinence  Lifestyle  . Physical activity:    Days per week: Not on file    Minutes per session: Not on file  . Stress: Not on file  Relationships  . Social connections:    Talks on phone: Not on file    Gets together: Not on file    Attends religious service: Not on file    Active member of club or organization: Not on file    Attends meetings of clubs or organizations: Not on file    Relationship status: Not on file  . Intimate  partner violence:    Fear of current or ex partner: Not on file    Emotionally abused: Not on file    Physically abused: Not on file    Forced sexual activity: Not on file  Other Topics Concern  . Not on file  Social History Narrative  . Not on file   Family History  Problem Relation Age of Onset  . Diabetes Mother 6  . Heart disease Mother   . Hyperlipidemia Mother   . Cancer Father   . Heart attack Father 10  . Cancer Maternal Grandmother   . Cancer Paternal Grandmother   . Thyroid disease Sister   . Diabetes Child     Current Outpatient Medications  Medication Sig Dispense Refill  . aspirin EC 81 MG tablet Take 1 tablet (81 mg total) by mouth daily. 90 tablet 3  . carvedilol (COREG) 3.125 MG tablet Take 1 tablet (3.125 mg total)  by mouth 2 (two) times daily with a meal. 180 tablet 3  . cholecalciferol (VITAMIN D) 1000 UNITS tablet Take 1,000 Units by mouth 2 (two) times daily.     . clopidogrel (PLAVIX) 75 MG tablet Take 1 tablet (75 mg total) by mouth daily with breakfast. 90 tablet 1  . ezetimibe (ZETIA) 10 MG tablet Take 1 tablet (10 mg total) by mouth daily. 90 tablet 3  . feeding supplement, ENSURE ENLIVE, (ENSURE ENLIVE) LIQD Take 237 mLs by mouth 3 (three) times daily between meals. (Patient taking differently: Take 237 mLs by mouth daily. ) 237 mL 12  . insulin aspart (NOVOLOG) 100 UNIT/ML injection Inject 0-15 Units into the skin 3 (three) times daily with meals. 10 mL 11  . insulin glargine (LANTUS) 100 UNIT/ML injection Inject 0.06 mLs (6 Units total) into the skin at bedtime. 10 mL 11  . levothyroxine (SYNTHROID, LEVOTHROID) 50 MCG tablet Take 1 tablet (50 mcg total) by mouth daily. 90 tablet 3  . NONFORMULARY OR COMPOUNDED ITEM [facility]:  Onychomycosis Nail Lacquer - Fluconazole 2%, Terbinafine 1%, DMSO, apply daily to affected area. (Patient taking differently: Apply 1 application topically daily as needed. [facility]:  Onychomycosis Nail Lacquer - Fluconazole 2%, Terbinafine 1%, DMSO, apply daily to affected area.) 480 each 11  . simvastatin (ZOCOR) 40 MG tablet Take 1 tablet (40 mg total) by mouth at bedtime. 90 tablet 3  . torsemide (DEMADEX) 20 MG tablet Take 1 tablet (20 mg total) by mouth daily. 90 tablet 3  . venlafaxine XR (EFFEXOR-XR) 150 MG 24 hr capsule Take 1 capsule (150 mg total) by mouth daily. 90 capsule 2  . vitamin C (ASCORBIC ACID) 500 MG tablet Take 500 mg by mouth daily.     No current facility-administered medications for this encounter.    Vitals:   10/02/17 1435  BP: (!) 100/57  Pulse: 78  SpO2: 97%  Weight: 124 lb 8 oz (56.5 kg)   Wt Readings from Last 3 Encounters:  10/02/17 124 lb 8 oz (56.5 kg)  09/17/17 122 lb 12.8 oz (55.7 kg)  09/13/17 123 lb (55.8 kg)     General: NAD Neck: No JVD, no thyromegaly or thyroid nodule.  Lungs: Clear to auscultation bilaterally with normal respiratory effort. CV: Nondisplaced PMI.  Heart regular S1/S2, no S3/S4, no murmur.  No peripheral edema.  No carotid bruit.  Difficult to palpate pedal pulses.  Abdomen: Soft, nontender, no hepatosplenomegaly, no distention.  Skin: Intact without lesions or rashes.  Neurologic: Alert and oriented x 3.  Psych:  Normal affect. Extremities: No clubbing or cyanosis.  HEENT: Normal.   Assessment/Plan: 1. Chronic systolic CHF: Ischemic cardiomyopathy.  Echo 1/19 with EF 20-25%.  Echo 09/03/17 with EF 25-30%. She is not volume overloaded on exam.  NYHA class II symptoms.  She is off spironolactone due to hyperkalemia and off Entresto due to orthostatic hypotension. Most recent K was still high at 5.5. I will not titrate meds today with ongoing soft BP.  - Continue torsemide 20 mg daily. BMET today.   - Continue Coreg 3.125 mg bid.  - Consider low dose losartan at next visit if creatinine is stable, may need to start Veltassa at the same time. - Continue compression hose.  - ICD to be placed later this week, she is not a CRT candidate.  2. CAD: No chest pain.  - Continue Plavix, ASA 81 daily. Should continue Plavix long-term.  - Continue simvastatin 40 daily with Zetia 10 daily. Lipids stable 08/13/17.  3. Diabetes: Per PCP. Consider SGLT-2 inhibitor given CHF. 4. Atrial fibrillation: Paroxysmal, noted on post-op CABG.  Will need anticoagulation if it recurs.  5. Orthostatic hypotension/dizziness: Resolved after cutting back meds, uses cane.  6. PVCs: Multiple PVCs noted on prior ECGs. ?If this could be contributing to cardiomyopathy.   - I will arrange for 24 hour holter to quantify PVCs.  7. CKD: Stage 3.  K remained high when last checked, repeat BMET today.  To use ARB/ACEI or spironolactone in the future, will probably need to start Veltassa as well.   Followup 6 wks with NP/PA.    Marca Ancona, MD  10/03/2017

## 2017-10-05 ENCOUNTER — Encounter (HOSPITAL_COMMUNITY)
Admission: RE | Disposition: A | Discharge status: Home / Self Care | Payer: Self-pay | Source: Ambulatory Visit | Attending: Internal Medicine

## 2017-10-05 ENCOUNTER — Ambulatory Visit (HOSPITAL_COMMUNITY)
Admission: RE | Admit: 2017-10-05 | Discharge: 2017-10-06 | Disposition: A | Discharge status: Home / Self Care | Payer: Medicare HMO | Source: Ambulatory Visit | Attending: Internal Medicine | Admitting: Internal Medicine

## 2017-10-05 DIAGNOSIS — I252 Old myocardial infarction: Secondary | ICD-10-CM | POA: Diagnosis not present

## 2017-10-05 DIAGNOSIS — Z7982 Long term (current) use of aspirin: Secondary | ICD-10-CM | POA: Diagnosis not present

## 2017-10-05 DIAGNOSIS — E875 Hyperkalemia: Secondary | ICD-10-CM | POA: Diagnosis not present

## 2017-10-05 DIAGNOSIS — I5022 Chronic systolic (congestive) heart failure: Secondary | ICD-10-CM | POA: Diagnosis not present

## 2017-10-05 DIAGNOSIS — E1140 Type 2 diabetes mellitus with diabetic neuropathy, unspecified: Secondary | ICD-10-CM | POA: Diagnosis not present

## 2017-10-05 DIAGNOSIS — I255 Ischemic cardiomyopathy: Secondary | ICD-10-CM | POA: Diagnosis not present

## 2017-10-05 DIAGNOSIS — I951 Orthostatic hypotension: Secondary | ICD-10-CM | POA: Diagnosis present

## 2017-10-05 DIAGNOSIS — E039 Hypothyroidism, unspecified: Secondary | ICD-10-CM | POA: Insufficient documentation

## 2017-10-05 DIAGNOSIS — I472 Ventricular tachycardia: Secondary | ICD-10-CM

## 2017-10-05 DIAGNOSIS — Z959 Presence of cardiac and vascular implant and graft, unspecified: Secondary | ICD-10-CM

## 2017-10-05 DIAGNOSIS — I25708 Atherosclerosis of coronary artery bypass graft(s), unspecified, with other forms of angina pectoris: Secondary | ICD-10-CM

## 2017-10-05 DIAGNOSIS — Z8249 Family history of ischemic heart disease and other diseases of the circulatory system: Secondary | ICD-10-CM | POA: Insufficient documentation

## 2017-10-05 DIAGNOSIS — N179 Acute kidney failure, unspecified: Secondary | ICD-10-CM | POA: Diagnosis not present

## 2017-10-05 DIAGNOSIS — Z955 Presence of coronary angioplasty implant and graft: Secondary | ICD-10-CM | POA: Insufficient documentation

## 2017-10-05 DIAGNOSIS — E785 Hyperlipidemia, unspecified: Secondary | ICD-10-CM | POA: Insufficient documentation

## 2017-10-05 DIAGNOSIS — I480 Paroxysmal atrial fibrillation: Secondary | ICD-10-CM | POA: Insufficient documentation

## 2017-10-05 DIAGNOSIS — I700 Atherosclerosis of aorta: Secondary | ICD-10-CM | POA: Diagnosis not present

## 2017-10-05 DIAGNOSIS — I110 Hypertensive heart disease with heart failure: Secondary | ICD-10-CM | POA: Insufficient documentation

## 2017-10-05 DIAGNOSIS — Z794 Long term (current) use of insulin: Secondary | ICD-10-CM | POA: Diagnosis not present

## 2017-10-05 DIAGNOSIS — I25810 Atherosclerosis of coronary artery bypass graft(s) without angina pectoris: Secondary | ICD-10-CM | POA: Diagnosis not present

## 2017-10-05 DIAGNOSIS — I4729 Other ventricular tachycardia: Secondary | ICD-10-CM

## 2017-10-05 HISTORY — PX: ICD IMPLANT: EP1208

## 2017-10-05 LAB — GLUCOSE, CAPILLARY
GLUCOSE-CAPILLARY: 254 mg/dL — AB (ref 65–99)
Glucose-Capillary: 161 mg/dL — ABNORMAL HIGH (ref 65–99)
Glucose-Capillary: 153 mg/dL — ABNORMAL HIGH (ref 65–99)

## 2017-10-05 LAB — SURGICAL PCR SCREEN
MRSA, PCR: NEGATIVE
STAPHYLOCOCCUS AUREUS: NEGATIVE

## 2017-10-05 SURGERY — ICD IMPLANT

## 2017-10-05 MED ORDER — MUPIROCIN 2 % EX OINT
TOPICAL_OINTMENT | CUTANEOUS | Status: AC
  Filled 2017-10-05: qty 22

## 2017-10-05 MED ORDER — CEFAZOLIN SODIUM-DEXTROSE 2-4 GM/100ML-% IV SOLN
2.0000 g | INTRAVENOUS | Status: AC
  Administered 2017-10-05: 2 g via INTRAVENOUS
  Filled 2017-10-05: qty 100

## 2017-10-05 MED ORDER — FENTANYL CITRATE (PF) 100 MCG/2ML IJ SOLN
INTRAMUSCULAR | Status: DC | PRN
  Administered 2017-10-05 (×3): 25 ug via INTRAVENOUS

## 2017-10-05 MED ORDER — VITAMIN D 1000 UNITS PO TABS
1000.0000 [IU] | ORAL_TABLET | Freq: Two times a day (BID) | ORAL | Status: DC
  Administered 2017-10-05 – 2017-10-06 (×2): 1000 [IU] via ORAL
  Filled 2017-10-05 (×2): qty 1

## 2017-10-05 MED ORDER — ONDANSETRON HCL 4 MG/2ML IJ SOLN: 4.0000 mg | Freq: Four times a day (QID) | INTRAMUSCULAR | Status: DC | PRN

## 2017-10-05 MED ORDER — VENLAFAXINE HCL ER 75 MG PO CP24
150.0000 mg | ORAL_CAPSULE | Freq: Every day | ORAL | Status: DC
  Administered 2017-10-05 – 2017-10-06 (×2): 150 mg via ORAL
  Filled 2017-10-05 (×2): qty 2

## 2017-10-05 MED ORDER — CARVEDILOL 3.125 MG PO TABS
3.1250 mg | ORAL_TABLET | Freq: Two times a day (BID) | ORAL | Status: DC
  Administered 2017-10-05 – 2017-10-06 (×2): 3.125 mg via ORAL
  Filled 2017-10-05 (×2): qty 1

## 2017-10-05 MED ORDER — INSULIN GLARGINE 100 UNIT/ML ~~LOC~~ SOLN
6.0000 [IU] | Freq: Every day | SUBCUTANEOUS | Status: DC
  Administered 2017-10-05: 6 [IU] via SUBCUTANEOUS
  Filled 2017-10-05: qty 0.06

## 2017-10-05 MED ORDER — CEFAZOLIN SODIUM-DEXTROSE 1-4 GM/50ML-% IV SOLN
1.0000 g | Freq: Four times a day (QID) | INTRAVENOUS | Status: AC
  Administered 2017-10-06 (×2): 1 g via INTRAVENOUS
  Filled 2017-10-05 (×2): qty 50

## 2017-10-05 MED ORDER — CEFAZOLIN SODIUM-DEXTROSE 2-4 GM/100ML-% IV SOLN
INTRAVENOUS | Status: AC
  Filled 2017-10-05: qty 100

## 2017-10-05 MED ORDER — CLOPIDOGREL BISULFATE 75 MG PO TABS
75.0000 mg | ORAL_TABLET | Freq: Every day | ORAL | Status: DC
  Administered 2017-10-06: 75 mg via ORAL
  Filled 2017-10-05: qty 1

## 2017-10-05 MED ORDER — SODIUM CHLORIDE 0.9 % IV SOLN
INTRAVENOUS | Status: DC
  Administered 2017-10-05: 07:00:00 via INTRAVENOUS

## 2017-10-05 MED ORDER — LEVOTHYROXINE SODIUM 50 MCG PO TABS
50.0000 ug | ORAL_TABLET | Freq: Every day | ORAL | Status: DC
  Administered 2017-10-06: 50 ug via ORAL
  Filled 2017-10-05: qty 1

## 2017-10-05 MED ORDER — SODIUM CHLORIDE 0.9 % IV SOLN
80.0000 mg | INTRAVENOUS | Status: AC
  Administered 2017-10-05: 80 mg
  Filled 2017-10-05: qty 2

## 2017-10-05 MED ORDER — CEFAZOLIN SODIUM-DEXTROSE 1-4 GM/50ML-% IV SOLN
1.0000 g | Freq: Four times a day (QID) | INTRAVENOUS | Status: DC
  Administered 2017-10-05: 1 g via INTRAVENOUS
  Filled 2017-10-05 (×3): qty 50

## 2017-10-05 MED ORDER — INSULIN ASPART 100 UNIT/ML ~~LOC~~ SOLN: 0.0000 [IU] | Freq: Three times a day (TID) | SUBCUTANEOUS | Status: DC

## 2017-10-05 MED ORDER — LIDOCAINE HCL (PF) 1 % IJ SOLN
INTRAMUSCULAR | Status: AC
  Filled 2017-10-05: qty 60

## 2017-10-05 MED ORDER — SODIUM CHLORIDE 0.9 % IV SOLN: INTRAVENOUS | Status: AC

## 2017-10-05 MED ORDER — FENTANYL CITRATE (PF) 100 MCG/2ML IJ SOLN
INTRAMUSCULAR | Status: AC
  Filled 2017-10-05: qty 2

## 2017-10-05 MED ORDER — ASPIRIN EC 81 MG PO TBEC
81.0000 mg | DELAYED_RELEASE_TABLET | Freq: Every day | ORAL | Status: DC
  Administered 2017-10-06: 81 mg via ORAL
  Filled 2017-10-05: qty 1

## 2017-10-05 MED ORDER — GENTAMICIN SULFATE 40 MG/ML IJ SOLN
INTRAMUSCULAR | Status: AC
  Filled 2017-10-05: qty 2

## 2017-10-05 MED ORDER — EZETIMIBE 10 MG PO TABS
10.0000 mg | ORAL_TABLET | Freq: Every day | ORAL | Status: DC
  Administered 2017-10-06: 10 mg via ORAL
  Filled 2017-10-05: qty 1

## 2017-10-05 MED ORDER — MIDAZOLAM HCL 5 MG/5ML IJ SOLN
INTRAMUSCULAR | Status: AC
  Filled 2017-10-05: qty 5

## 2017-10-05 MED ORDER — TORSEMIDE 20 MG PO TABS
20.0000 mg | ORAL_TABLET | Freq: Every day | ORAL | Status: DC
  Administered 2017-10-05 – 2017-10-06 (×2): 20 mg via ORAL
  Filled 2017-10-05 (×2): qty 1

## 2017-10-05 MED ORDER — MIDAZOLAM HCL 5 MG/5ML IJ SOLN
INTRAMUSCULAR | Status: DC | PRN
  Administered 2017-10-05 (×4): 1 mg via INTRAVENOUS

## 2017-10-05 MED ORDER — MUPIROCIN 2 % EX OINT
TOPICAL_OINTMENT | Freq: Once | CUTANEOUS | Status: AC
  Administered 2017-10-05: 1 via NASAL
  Filled 2017-10-05: qty 22

## 2017-10-05 MED ORDER — VITAMIN C 500 MG PO TABS
500.0000 mg | ORAL_TABLET | Freq: Every day | ORAL | Status: DC
  Administered 2017-10-06: 500 mg via ORAL
  Filled 2017-10-05: qty 1

## 2017-10-05 MED ORDER — HEPARIN (PORCINE) IN NACL 1000-0.9 UT/500ML-% IV SOLN
INTRAVENOUS | Status: AC
  Filled 2017-10-05: qty 500

## 2017-10-05 MED ORDER — CHLORHEXIDINE GLUCONATE 4 % EX LIQD
60.0000 mL | Freq: Once | CUTANEOUS | Status: DC
  Filled 2017-10-05: qty 60

## 2017-10-05 MED ORDER — SIMVASTATIN 40 MG PO TABS
40.0000 mg | ORAL_TABLET | Freq: Every day | ORAL | Status: DC
  Administered 2017-10-05: 40 mg via ORAL
  Filled 2017-10-05: qty 1

## 2017-10-05 MED ORDER — ACETAMINOPHEN 325 MG PO TABS: 325.0000 mg | ORAL_TABLET | ORAL | Status: DC | PRN

## 2017-10-05 MED ORDER — LIDOCAINE HCL (PF) 1 % IJ SOLN
INTRAMUSCULAR | Status: DC | PRN
  Administered 2017-10-05: 60 mL

## 2017-10-05 MED ORDER — HEPARIN (PORCINE) IN NACL 2-0.9 UNITS/ML
INTRAMUSCULAR | Status: AC | PRN
  Administered 2017-10-05: 500 mL

## 2017-10-05 SURGICAL SUPPLY — 8 items
CABLE SURGICAL S-101-97-12 (CABLE) ×1 IMPLANT
HEMOSTAT SURGICEL 2X4 FIBR (HEMOSTASIS) ×2 IMPLANT
ICD VISIA MRI VR DVFB1D4 (ICD Generator) IMPLANT
LEAD SPRINT QUAT SEC 6935M-62 (Lead) ×2 IMPLANT
PAD DEFIB LIFELINK (PAD) ×1 IMPLANT
SHEATH CLASSIC 9F (SHEATH) ×1 IMPLANT
TRAY PACEMAKER INSERTION (PACKS) ×1 IMPLANT
VISIA MRI VR DVFB1D4 (ICD Generator) ×2 IMPLANT

## 2017-10-05 NOTE — Progress Notes (Signed)
Patient admitted to unit from cath lab. Incision to left upper shoulder intact with dermabond. No dressing in place. Incision well approximated and well adhered.  Vital signs have been WNL, no bleeding from sight noted. Sling in place to left arm and pt reminded not to use left arm.  Pt has been on bedrest since arrival to unit and remains on bedrest until in the am.  Sitting up in bed at present eating dinner and tolerating well.

## 2017-10-05 NOTE — Interval H&P Note (Signed)
ICD Criteria  Current LVEF:25%. Within 12 months prior to implant: Yes   Heart failure history: Yes, Class III  Cardiomyopathy history: Yes, Ischemic Cardiomyopathy - Prior MI.  Atrial Fibrillation/Atrial Flutter: No.  Ventricular tachycardia history: No.  Cardiac arrest history: No.  History of syndromes with risk of sudden death: No.  Previous ICD: No.  Current ICD indication: Primary  PPM indication: No.   Class I or II Bradycardia indication present: No  Beta Blocker therapy for 3 or more months: Yes, prescribed.   Ace Inhibitor/ARB therapy for 3 or more months: Yes, prescribed.   History and Physical Interval Note:  10/05/2017 11:21 AM  Glendell K Freel  has presented today for surgery, with the diagnosis of cardiomyopathy  The various methods of treatment have been discussed with the patient and family. After consideration of risks, benefits and other options for treatment, the patient has consented to  Procedure(s): ICD IMPLANT (N/A) as a surgical intervention .  The patient's history has been reviewed, patient examined, no change in status, stable for surgery.  I have reviewed the patient's chart and labs.  Questions were answered to the patient's satisfaction.     Sherryl MangesSteven Alajah Witman

## 2017-10-05 NOTE — Discharge Instructions (Signed)
° ° °  Supplemental Discharge Instructions for  °Pacemaker/Defibrillator Patients ° °Activity °No heavy lifting or vigorous activity with your left/right arm for 6 to 8 weeks.  Do not raise your left/right arm above your head for one week.  Gradually raise your affected arm as drawn below. ° °        °    10/09/17                      10/10/17                       10/11/17                    10/12/17 °__ ° °NO DRIVING for  1 week   ; you may begin driving on   10/12/17  . ° °WOUND CARE °- Keep the wound area clean and dry.  Do not get this area wet for one week. No showers for one week; you may shower on  10/12/17  . °- The tape/steri-strips on your wound will fall off; do not pull them off.  No bandage is needed on the site.  DO  NOT apply any creams, oils, or ointments to the wound area. °- If you notice any drainage or discharge from the wound, any swelling or bruising at the site, or you develop a fever > 101? F after you are discharged home, call the office at once. ° °Special Instructions °- You are still able to use cellular telephones; use the ear opposite the side where you have your pacemaker/defibrillator.  Avoid carrying your cellular phone near your device. °- When traveling through airports, show security personnel your identification card to avoid being screened in the metal detectors.  Ask the security personnel to use the hand wand. °- Avoid arc welding equipment, MRI testing (magnetic resonance imaging), TENS units (transcutaneous nerve stimulators).  Call the office for questions about other devices. °- Avoid electrical appliances that are in poor condition or are not properly grounded. °- Microwave ovens are safe to be near or to operate. ° °Additional information for defibrillator patients should your device go off: °- If your device goes off ONCE and you feel fine afterward, notify the device clinic nurses. °- If your device goes off ONCE and you do not feel well afterward, call 911. °- If your device goes  off TWICE, call 911. °- If your device goes off THREE times in one day, call 911. ° °DO NOT DRIVE YOURSELF OR A FAMILY MEMBER °WITH A DEFIBRILLATOR TO THE HOSPITAL--CALL 911. ° °

## 2017-10-05 NOTE — Interval H&P Note (Signed)
History and Physical Interval Note:  10/05/2017 6:53 AM  [name]  has presented today for surgery, with the diagnosis of cardiomyopathy  The various methods of treatment have been discussed with the patient and family. After consideration of risks, benefits and other options for treatment, the patient has consented to  Procedure(s): ICD IMPLANT (N/A) as a surgical intervention .  The patient's history has been reviewed, patient examined, no change in status, stable for surgery.  I have reviewed the patient's chart and labs.  Questions were answered to the patient's satisfaction.     Sherryl MangesSteven Klein

## 2017-10-06 ENCOUNTER — Ambulatory Visit (HOSPITAL_COMMUNITY): Payer: Medicare HMO

## 2017-10-06 DIAGNOSIS — I5022 Chronic systolic (congestive) heart failure: Secondary | ICD-10-CM | POA: Diagnosis not present

## 2017-10-06 DIAGNOSIS — N179 Acute kidney failure, unspecified: Secondary | ICD-10-CM | POA: Diagnosis not present

## 2017-10-06 DIAGNOSIS — E875 Hyperkalemia: Secondary | ICD-10-CM | POA: Diagnosis not present

## 2017-10-06 DIAGNOSIS — I255 Ischemic cardiomyopathy: Secondary | ICD-10-CM | POA: Diagnosis not present

## 2017-10-06 DIAGNOSIS — I110 Hypertensive heart disease with heart failure: Secondary | ICD-10-CM | POA: Diagnosis not present

## 2017-10-06 DIAGNOSIS — I25810 Atherosclerosis of coronary artery bypass graft(s) without angina pectoris: Secondary | ICD-10-CM | POA: Diagnosis not present

## 2017-10-06 DIAGNOSIS — I951 Orthostatic hypotension: Secondary | ICD-10-CM | POA: Diagnosis not present

## 2017-10-06 DIAGNOSIS — I472 Ventricular tachycardia: Secondary | ICD-10-CM | POA: Diagnosis not present

## 2017-10-06 DIAGNOSIS — I252 Old myocardial infarction: Secondary | ICD-10-CM | POA: Diagnosis not present

## 2017-10-06 DIAGNOSIS — I700 Atherosclerosis of aorta: Secondary | ICD-10-CM | POA: Diagnosis not present

## 2017-10-06 LAB — GLUCOSE, CAPILLARY: Glucose-Capillary: 170 mg/dL — ABNORMAL HIGH (ref 65–99)

## 2017-10-06 NOTE — Care Management Note (Signed)
Case Management Note  Patient Details  Name: [name] MRN: [id number] Date of Birth: 1945-10-18  Subjective/Objective:  Admitted for NSVT (nonsustained ventricular tachycardia)              Action/Plan: Patient with discharge orders home today.  No discharge needs noted.  Expected Discharge Date:  10/06/17               Expected Discharge Plan:  Home/Self Care  In-House Referral:    N/A Discharge planning Services  CM Consult  Status of Service:  Completed, signed off  Yancey FlemingsKimberly R Jhayden Demuro, RN 10/06/2017, 10:41 AM

## 2017-10-06 NOTE — Progress Notes (Addendum)
PT discharged home IV and tele removed. Family at the bedside. Site instruction given to pt and daughter. Questions asked and answered.

## 2017-10-06 NOTE — Discharge Summary (Addendum)
Discharge Summary    Patient ID: [name],  MRN: [id number], DOB/AGE: 1946/02/01 72 y.o.  Admit date: 10/05/2017 Discharge date: 10/06/2017  Primary Care Provider: Dorisann FramesBalan, Bindubal Cardiology: Dr. Rennis GoldenHilty HF Cardiology: Dr. Shirlee LatchMcLean EP: Dr. Graciela HusbandsKlein     Discharge Diagnoses    Active Problems:   NSVT (nonsustained ventricular tachycardia) (HCC)   Orthostatic hypotension   Ischemic cardiomyopathy   ICD implant   HTN   HLD  Allergies Allergies  Allergen Reactions  . Crestor [Rosuvastatin] Other (See Comments)    unknown  . Lipitor [Atorvastatin] Other (See Comments)    Very bad pain in hips and joints    Diagnostic Studies/Procedures    ICD IMPLANT  Procedural Details/Technique   Technical Details Procedure: single chamber ICD implantation without intraoperative defibrillation threshold testing  Following the obtaining of informed consent the patient was brought to the electrophysiology laboratory in place of the fluoroscopic table in the supine position. After routine prep and drape, lidocaine was infiltrated in the prepectoral subclavicular region and an incision was made and carried down to the layer of the prepectoral fascia using electrocautery and sharp dissection. A pocket was formed similarly.  Thereafter an attention was turned to gain access to the extrathoracic left subclavian vein which was accomplished without difficulty and without the aspiration of air or puncture of the artery. A 9 French sheath was placed for which was then passed a single coil active fixation defibrillator lead, model Medtronic MRI compatible [id number] serial number [id number]. It was passed under fluoroscopic guidance to the right ventricular apex. In its location the bipolar R wave was 11.1 millivolts, impedance was 664 ohms, the pacing threshold was 0.9 volts at 0.5 msec. Current at threshold was 1.3 mA. There was no diaphragmatic pacing at 10 V. The current of injury was brisk .  The lead  was secured to the prepectoral fascia and then attached to a Medtronic MRI compatible ICD, serial number [id number]. Through the device, the bipolar R wave was 12.9 millivolts, impedance was 589 ohms, the pacing threshold was 0.75 volts at 0.4 msec. High-voltage impedance was 64 ohms.  The pocket was copiously irrigated with antibiotic containing saline solution. Hemostasis was assured, and the device and the lead was placed in the pocket and secured to the prepectoral fascia.    The wound was closed in 3 layers in normal fashion. The wound was washed dried and a DERMABOND was then applied. Needle counts sponge counts and instrument counts were correct at the end of the procedure according to the staff.      History of Present Illness     72 yo with history of CAD, ischemic cardiomyopathy, HTN, HLD, DM, PAF, PAD, and hypothyroidism presented 5/31 for ICD implantation.   She had CABG x 4 in 2008.  Echo in 3/13 was 40-45%.  By 10/18, EF had dropped to 20% by echo.    She was admitted in 1/19 with CHF exacerbation/NSTEMI.  LHC showed occluded SVG-PDA and serial 70% and 90% stenoses in the proximal to mid RCA.  She had atherectomy with DES x 2 to RCA.  Echo in 1/19 with EF 20-25%.  She was diuresed and had left thoracentesis for pleural effusion.   She developed lightheadedness/orthostasis and AKI with hyperkalemia. Entresto and spironolactone were stopped.   Seen by Dr.McLean 10/02/17 for CHF follow up. Due to persistent low EF recommended ICD. Not CRT candidate.   Hospital Course     Consultants: None  The patient  presented and underwent successful implantation of Medtronic MRI compatible ICD. Serial number [id number]. No complications. CXR showed new LEFT chest wall ICD/pacemaker in place. No pleural effusion, pneumothorax or other procedural complicating feature. Device function normal. No change in home meds.   The patient has been seen by Dr. Elberta Fortis today and deemed ready for  discharge home. All follow-up appointments have been scheduled. Discharge medications are listed below.    Discharge Vitals Blood pressure (!) 101/49, pulse 79, temperature 98 F (36.7 C), temperature source Oral, resp. rate 18, height 5\' 2"  (1.575 m), weight 122 lb 9.2 oz (55.6 kg), SpO2 100 %.  Filed Weights   10/05/17 0541 10/06/17 0320  Weight: 122 lb (55.3 kg) 122 lb 9.2 oz (55.6 kg)   Physical Exam  Constitutional: She is oriented to person, place, and time and well-developed, well-nourished, and in no distress.  HENT:  Head: Normocephalic and atraumatic.  Eyes: Pupils are equal, round, and reactive to light.  Neck: Normal range of motion. Neck supple.  Cardiovascular: Normal rate and regular rhythm.  Pacemaker site looks good. No edema or erythema. Groin site stable.   Pulmonary/Chest: Effort normal and breath sounds normal.  Abdominal: Soft. Bowel sounds are normal.  Musculoskeletal: Normal range of motion.  Neurological: She is alert and oriented to person, place, and time.  Skin: Skin is warm and dry.  Psychiatric: Affect normal.   Labs & Radiologic Studies     Dg Chest 2 View  Result Date: 10/06/2017 CLINICAL DATA:  Cardiac device in situ EXAM: CHEST - 2 VIEW COMPARISON:  Chest x-ray dated 06/01/2017. FINDINGS: LEFT chest wall ICD/pacemaker now in place without evidence of lead displacement or discontinuity. No pleural effusion or pneumothorax seen. Heart size and mediastinal contours are stable, with surgical changes of median sternotomy and presumed CABG. Aortic atherosclerosis again noted. Probable mild scarring/atelectasis at the LEFT lung base. No acute or suspicious osseous finding. IMPRESSION: 1. New LEFT chest wall ICD/pacemaker in place. No pleural effusion, pneumothorax or other procedural complicating feature. 2. Aortic atherosclerosis. Electronically Signed   By: Bary Richard M.D.   On: 10/06/2017 08:31    Disposition   Pt is being discharged home today in  good condition.  Follow-up Plans & Appointments    Follow-up Information    [facility] [address] Office Follow up on 10/18/2017.   Specialty:  Cardiology Why:  2:30PM, wound check visit Contact information: [address], [address] [place] [address] [phone]       Duke Salvia, MD Follow up on 01/08/2018.   Specialty:  Cardiology Why:  2:00PM Contact information: [address] [place] [address] [phone]        [facility]. Go on 11/16/2017.   Specialty:  Cardiology Why:  @10am  for regular follow up Contact information: [address] [id number] mc [place] [address] [phone]         Discharge Instructions    Diet - low sodium heart healthy   Complete by:  As directed    Increase activity slowly   Complete by:  As directed       Discharge Medications   Allergies as of 10/06/2017      Reactions   Crestor [rosuvastatin] Other (See Comments)   unknown   Lipitor [atorvastatin] Other (See Comments)   Very bad pain in hips and joints      Medication List    TAKE these medications  aspirin EC 81 MG tablet Take 1 tablet (81 mg total) by mouth daily.   carvedilol 3.125 MG tablet Commonly known as:  COREG Take 1 tablet (3.125 mg total) by mouth 2 (two) times daily with a meal.   cholecalciferol 1000 units tablet Commonly known as:  VITAMIN D Take 1,000 Units by mouth 2 (two) times daily.   clopidogrel 75 MG tablet Commonly known as:  PLAVIX Take 1 tablet (75 mg total) by mouth daily with breakfast.   ezetimibe 10 MG tablet Commonly known as:  ZETIA Take 1 tablet (10 mg total) by mouth daily.   feeding supplement (ENSURE ENLIVE) Liqd Take 237 mLs by mouth 3 (three) times daily between meals. What changed:  when to take this   insulin aspart 100 UNIT/ML injection Commonly known as:  novoLOG Inject 0-15 Units into the skin 3  (three) times daily with meals.   insulin glargine 100 UNIT/ML injection Commonly known as:  LANTUS Inject 0.06 mLs (6 Units total) into the skin at bedtime.   levothyroxine 50 MCG tablet Commonly known as:  SYNTHROID, LEVOTHROID Take 1 tablet (50 mcg total) by mouth daily.   NONFORMULARY OR COMPOUNDED ITEM [facility]:  Onychomycosis Nail Lacquer - Fluconazole 2%, Terbinafine 1%, DMSO, apply daily to affected area. What changed:    how much to take  how to take this  when to take this  reasons to take this  additional instructions   simvastatin 40 MG tablet Commonly known as:  ZOCOR Take 1 tablet (40 mg total) by mouth at bedtime.   torsemide 20 MG tablet Commonly known as:  DEMADEX Take 1 tablet (20 mg total) by mouth daily.   venlafaxine XR 150 MG 24 hr capsule Commonly known as:  EFFEXOR-XR Take 1 capsule (150 mg total) by mouth daily.   vitamin C 500 MG tablet Commonly known as:  ASCORBIC ACID Take 500 mg by mouth daily.       Outstanding Labs/Studies   None  Duration of Discharge Encounter   Greater than 30 minutes including physician time.  Signed, Bhavinkumar Bhagat PA-C 10/06/2017, 10:18 AM   I have seen and examined this patient with Vin Bhagat.  Agree with above, note added to reflect my findings.  On exam, RRR, no murmurs, lungs clear.  Mid for ICD implant.  Chest x-ray and interrogation without issues.  Plan for follow-up in device clinic.  Will M. Camnitz MD 10/06/2017 11:50 AM

## 2017-10-08 ENCOUNTER — Encounter (HOSPITAL_COMMUNITY): Payer: Self-pay | Admitting: Internal Medicine

## 2017-10-11 ENCOUNTER — Telehealth (HOSPITAL_COMMUNITY): Payer: Self-pay

## 2017-10-11 NOTE — Telephone Encounter (Signed)
Result Notes for VAS US ABI WITH/WO TBI   Notes recorded by Chyrl CivatteBradley, Basilia Stuckert G, RN on 10/11/2017 at 3:57 PM EDT Patient aware and appreciative, denies any further issues regarding leg pain/discomfort. Reminded of upcoming appt next month and to call back sooner if needed. ------  Notes recorded by Alford HighlandSmith, Ashley M, NP on 10/09/2017 at 1:49 PM EDT Will you let her know that we got the results for her ABIs. Dr Shirlee LatchMcLean looked at them and we do not need to do any further work up at this point. ------  Notes recorded by Laurey MoraleMcLean, Dalton S, MD on 10/02/2017 at 4:19 PM EDT Not at this point.

## 2017-10-12 ENCOUNTER — Ambulatory Visit: Payer: Medicare HMO | Admitting: Internal Medicine

## 2017-10-18 ENCOUNTER — Ambulatory Visit (INDEPENDENT_AMBULATORY_CARE_PROVIDER_SITE_OTHER): Payer: Medicare HMO | Admitting: *Deleted

## 2017-10-18 ENCOUNTER — Ambulatory Visit (INDEPENDENT_AMBULATORY_CARE_PROVIDER_SITE_OTHER): Payer: Medicare HMO

## 2017-10-18 DIAGNOSIS — I493 Ventricular premature depolarization: Secondary | ICD-10-CM | POA: Diagnosis not present

## 2017-10-18 DIAGNOSIS — I5022 Chronic systolic (congestive) heart failure: Secondary | ICD-10-CM | POA: Diagnosis not present

## 2017-10-18 DIAGNOSIS — I255 Ischemic cardiomyopathy: Secondary | ICD-10-CM

## 2017-10-18 DIAGNOSIS — Z95810 Presence of automatic (implantable) cardiac defibrillator: Secondary | ICD-10-CM

## 2017-10-18 LAB — CUP PACEART INCLINIC DEVICE CHECK
Battery Remaining Longevity: 133 mo
HighPow Impedance: 42 Ohm
Implantable Lead Implant Date: 20190531
Implantable Lead Location: 753860
Implantable Pulse Generator Implant Date: 20190531
Lead Channel Impedance Value: 342 Ohm
Lead Channel Pacing Threshold Amplitude: 0.75 V
Lead Channel Pacing Threshold Pulse Width: 0.4 ms
Lead Channel Setting Sensing Sensitivity: 0.3 mV
MDC IDC MSMT BATTERY VOLTAGE: 3.04 V
MDC IDC MSMT LEADCHNL RV IMPEDANCE VALUE: 437 Ohm
MDC IDC MSMT LEADCHNL RV SENSING INTR AMPL: 10 mV
MDC IDC SESS DTM: 20190613160510
MDC IDC SET LEADCHNL RV PACING AMPLITUDE: 3.5 V
MDC IDC SET LEADCHNL RV PACING PULSEWIDTH: 0.4 ms
MDC IDC STAT BRADY RV PERCENT PACED: 0.01 %

## 2017-10-18 NOTE — Progress Notes (Addendum)
Wound check appointment. Dermabond removed. Scab along L lateral incision left in place. Wound without redness or edema. Incision edges approximated. Patient denies fever/chills, drainage, or pain at site. Patient is agreeable to wound recheck on 10/23/17 while SK is in office to monitor wound healing progress. She will plan to wash site daily with soap and water and is aware to call in the interim with any signs/symptoms of infection.  Normal device function. Thresholds, sensing, and impedances consistent with implant measurements. Device programmed at 3.5V for extra safety margin until 3 month visit. Histogram distribution appropriate for patient and level of activity. No ventricular arrhythmias noted. 54 AF episodes (4.4%)--available ECGs appear SR w/frequent ectopy. Plan for 24hr Holter to assess PVC burden per Dr. Alford HighlandMcLean's notes. Max RV pacing impedance increased to 2000ohms per protocol. Patient educated about wound care, arm mobility, lifting restrictions, shock plan, and Carelink monitor. ROV with SK on 01/08/18.  Patient gave verbal permission to include photo of wound:

## 2017-10-23 ENCOUNTER — Ambulatory Visit (INDEPENDENT_AMBULATORY_CARE_PROVIDER_SITE_OTHER): Payer: Self-pay | Admitting: *Deleted

## 2017-10-23 DIAGNOSIS — I5022 Chronic systolic (congestive) heart failure: Secondary | ICD-10-CM

## 2017-10-23 DIAGNOSIS — Z95810 Presence of automatic (implantable) cardiac defibrillator: Secondary | ICD-10-CM

## 2017-10-23 DIAGNOSIS — I255 Ischemic cardiomyopathy: Secondary | ICD-10-CM

## 2017-10-23 NOTE — Progress Notes (Signed)
Ms. [name] presents to the [facility] today for ICD incision evaluation. Incision appears less red and less edematous that at the visit on 10/18/17 (see photo). Scab remains at patient's left lateral incision. I have advised the patient to continue washing the site at least daily with warm, soapy water and to monitor for and call the Device Clinic if she develops any increased redness, swelling, any drainage, fever and chills. She verbalizes understanding and will be following up with Dr. Graciela HusbandsKlein 01/08/18.

## 2017-11-06 ENCOUNTER — Telehealth (HOSPITAL_COMMUNITY): Payer: Self-pay

## 2017-11-06 ENCOUNTER — Encounter (HOSPITAL_COMMUNITY): Payer: Self-pay

## 2017-11-06 NOTE — Telephone Encounter (Signed)
Notes recorded by Teresa CoombsGunter, Larnce Schnackenberg, RN on 11/06/2017 at 3:53 PM EDT I have been unable to reach this patient by phone. A letter is being sent.  ------  Notes recorded by Teresa CoombsGunter, Aishi Courts, RN on 11/05/2017 at 12:56 PM EDT No answer, no VM ------  Notes recorded by Noralee SpaceSchub, Heather M, RN on 10/30/2017 at 4:27 PM EDT Attempted to call pt, no answer and no VM ------  Notes recorded by Laurey MoraleMcLean, Dalton S, MD on 10/27/2017 at 4:20 PM EDT Frequent PVCs. Would increase Coreg to 6.25 mg bid. Will need appointment with me or Dr. Graciela HusbandsKlein soon to discuss use of amiodarone to suppress given concern that these worsen her cardiomyopathy.

## 2017-11-16 ENCOUNTER — Encounter (HOSPITAL_COMMUNITY): Payer: Medicare HMO

## 2017-11-19 NOTE — Progress Notes (Signed)
Advanced Heart Failure Clinic Note  PCP: Dorisann FramesBalan, Bindubal, MD Cardiology: Dr. Rennis GoldenHilty HF Cardiology: Dr. Shirlee LatchMcLean EP: Dr Graciela HusbandsKlein  72 yo with history of CAD and ischemic cardiomyopathy presents for followup of CAD and CHF.  She had CABG x 4 in 2008.  Echo in 3/13 was 40-45%.  By 10/18, EF had dropped to 20% by echo.    She was admitted in 1/19 with CHF exacerbation/NSTEMI.  LHC showed occluded SVG-PDA and serial 70% and 90% stenoses in the proximal to mid RCA.  She had atherectomy with DES x 2 to RCA.  Echo in 1/19 with EF 20-25%.  She was diuresed and had left thoracentesis for pleural effusion.   She developed lightheadedness/orthostasis and AKI with hyperkalemia. Entresto and spironolactone were stopped.   S/p Medtronic ICD placement 10/05/17.  She presents today for regular follow up. Her coreg was recently increased with 20% PVCs on Holter monitor 10/2017. Overall doing well. Denies SOB with stairs or walking around the house. Wants to walk outside, but it's too hot. She has episodes when she is lying flat in bed and she feels like she needs to take a deep breath, but it only occurs when her head is positioned in a certain way. Denies orthopnea otherwise. No PND. She has chronic mild BLE edema that is unchanged. Dizziness is improving. It occurs with certain lighting, but not sitting to standing. No falls. No CP or palpitations. Weight is up 6 lbs, but she has been eating a lot. Energy level is good. Limits salt and drinks less than 2L/day. Taking all medications.   Labs (1/19): K 3.9, creatinine 0.88 Labs (4/19): K 5.1, Creatinine 1.31 Labs (5/19): K 5.8 => 5.5, creatinine 1.21  PMH: 1. CAD: s/p CABG x 4 in 2008.  - NSTEMI 1/19 with LHC showing patent LIMA-LAD, 80% proximal LAD, 70% proximal RCA, 90% mid RCA, SVG-ramus patent, SVG-OM patent, SVG-PDA occluded.  Patient had atherectomy with DES x 2 to prox and mid RCA.   2. Chronic systolic CHF: Ischemic cardiomyopathy.  - Echo (3/13): EF  45-50%.  - Echo (10/18): EF 20%.  - Echo (1/19): EF 20-25%, diffuse hypokinesis, mild LV dilation.  - Echo (4/19): EF 25-30%.  3. HTN 4. H/o left pleural effusion 5. Hyperlipidemia 6. Type II diabetes 7. Atrial fibrillation: Paroxysmal, noted only post-op CABG.  8. Hypothyroidism 9. Depression 10. Hyperkalemia with spironolactone.  11. PAD: ABIs 5/19 with normal ABI on right, left noncompressible.   Review of systems complete and found to be negative unless listed in HPI.   Social History   Socioeconomic History  . Marital status: Married    Spouse name: Not on file  . Number of children: 1  . Years of education: 5713  . Highest education level: Not on file  Occupational History    Employer: [employer]  Social Needs  . Financial resource strain: Not on file  . Food insecurity:    Worry: Not on file    Inability: Not on file  . Transportation needs:    Medical: Not on file    Non-medical: Not on file  Tobacco Use  . Smoking status: Never Smoker  . Smokeless tobacco: Never Used  Substance and Sexual Activity  . Alcohol use: No  . Drug use: No  . Sexual activity: Never    Birth control/protection: Abstinence  Lifestyle  . Physical activity:    Days per week: Not on file    Minutes per session: Not on file  .  Stress: Not on file  Relationships  . Social connections:    Talks on phone: Not on file    Gets together: Not on file    Attends religious service: Not on file    Active member of club or organization: Not on file    Attends meetings of clubs or organizations: Not on file    Relationship status: Not on file  . Intimate partner violence:    Fear of current or ex partner: Not on file    Emotionally abused: Not on file    Physically abused: Not on file    Forced sexual activity: Not on file  Other Topics Concern  . Not on file  Social History Narrative  . Not on file   Family History  Problem Relation Age of Onset  . Diabetes Mother 23  . Heart  disease Mother   . Hyperlipidemia Mother   . Cancer Father   . Heart attack Father 55  . Cancer Maternal Grandmother   . Cancer Paternal Grandmother   . Thyroid disease Sister   . Diabetes Child     Current Outpatient Medications  Medication Sig Dispense Refill  . aspirin EC 81 MG tablet Take 1 tablet (81 mg total) by mouth daily. 90 tablet 3  . carvedilol (COREG) 3.125 MG tablet Take 1 tablet (3.125 mg total) by mouth 2 (two) times daily with a meal. 180 tablet 3  . cholecalciferol (VITAMIN D) 1000 UNITS tablet Take 1,000 Units by mouth 2 (two) times daily.     . clopidogrel (PLAVIX) 75 MG tablet Take 1 tablet (75 mg total) by mouth daily with breakfast. 90 tablet 1  . ezetimibe (ZETIA) 10 MG tablet Take 1 tablet (10 mg total) by mouth daily. 90 tablet 3  . feeding supplement, ENSURE ENLIVE, (ENSURE ENLIVE) LIQD Take 237 mLs by mouth 3 (three) times daily between meals. (Patient taking differently: Take 237 mLs by mouth daily. ) 237 mL 12  . insulin aspart (NOVOLOG) 100 UNIT/ML injection Inject 0-15 Units into the skin 3 (three) times daily with meals. 10 mL 11  . insulin glargine (LANTUS) 100 UNIT/ML injection Inject 0.06 mLs (6 Units total) into the skin at bedtime. 10 mL 11  . levothyroxine (SYNTHROID, LEVOTHROID) 50 MCG tablet Take 1 tablet (50 mcg total) by mouth daily. 90 tablet 3  . NONFORMULARY OR COMPOUNDED ITEM [facility]:  Onychomycosis Nail Lacquer - Fluconazole 2%, Terbinafine 1%, DMSO, apply daily to affected area. (Patient taking differently: Apply 1 application topically daily as needed. [facility]:  Onychomycosis Nail Lacquer - Fluconazole 2%, Terbinafine 1%, DMSO, apply daily to affected area.) 480 each 11  . simvastatin (ZOCOR) 40 MG tablet Take 1 tablet (40 mg total) by mouth at bedtime. 90 tablet 3  . torsemide (DEMADEX) 20 MG tablet Take 1 tablet (20 mg total) by mouth daily. 90 tablet 3  . venlafaxine XR (EFFEXOR-XR) 150 MG 24 hr capsule Take 1 capsule  (150 mg total) by mouth daily. 90 capsule 2  . vitamin C (ASCORBIC ACID) 500 MG tablet Take 500 mg by mouth daily.     No current facility-administered medications for this encounter.    Vitals:   11/21/17 0937  BP: (!) 112/54  Pulse: 77  SpO2: 98%  Weight: 128 lb (58.1 kg)   Wt Readings from Last 3 Encounters:  11/21/17 128 lb (58.1 kg)  10/06/17 122 lb 9.2 oz (55.6 kg)  10/02/17 124 lb 8 oz (56.5 kg)  General: No resp difficulty. Walked in with cane.  HEENT: Normal Neck: Supple. JVP 5-6. Carotids 2+ bilat; no bruits. No thyromegaly or nodule noted. Cor: PMI nondisplaced. RRR with PVCs, No M/G/R noted. LU chest scab from ICD incision Lungs: CTAB, normal effort. Abdomen: Soft, non-tender, non-distended, no HSM. No bruits or masses. +BS  Extremities: No cyanosis, clubbing, or rash. R and LLE nonpitting ankle edema  Neuro: Alert & orientedx3, cranial nerves grossly intact. moves all 4 extremities w/o difficulty. Affect pleasant  EKG: NSR 74 bpm with PACs and frequent unifocal PVCs. Personally reviewed.   Assessment/Plan: 1. Chronic systolic CHF: Ischemic cardiomyopathy.  Echo 1/19 with EF 20-25%.  Echo 09/03/17 with EF 25-30%. Now s/p Medtronic ICD placement 10/05/17. - NYHA class II symptoms. - Volume status stable on exam. - Continue torsemide 20 mg daily. BMET today.   - Continue Coreg 6.25 mg BID - Consider low dose losartan at next visit if creatinine is stable, may need to start Veltassa at the same time. Had orthostatic hypotension with Entresto. Will hold off on starting today with initiation of amiodarone. Last K 5.0 - Off spiro with hyperkalemia - Continue compression hose.  2. CAD:  - No s/s ischemia - Continue Plavix, ASA 81 daily. Should continue Plavix long-term.  - Continue simvastatin 40 daily with Zetia 10 daily. Lipids stable 08/13/17.  3. Diabetes: Per PCP. Consider SGLT-2 inhibitor given CHF. No change.  4. Atrial fibrillation: Paroxysmal, noted on post-op  CABG.  Will need anticoagulation if it recurs. No change 5. Orthostatic hypotension/dizziness: Resolved after cutting back meds, uses cane. No change 6. PVCs: Multiple PVCs noted on prior ECGs. ?If this could be contributing to cardiomyopathy.   - Holter showed frequent PVCs 10/2017 (20%). Coreg increased to 6.25 mg BID. Denies palpitations.  - Start amiodarone 200 mg BID. Discussed need for yearly eye exams. Will forward chart to Dr Graciela Husbands 7. CKD: Stage 3. With hyperkalemia. K 5.0 on 5/28.  BMET today Start amiodarone 200 mg BID Follow up pharmacy 3-4 weeks, follow up with APP 8 weeks I will let Dr Graciela Husbands know about initiation of amiodarone.   Alford Highland, NP  11/21/2017   Greater than 50% of the 25 minute visit was spent in counseling/coordination of care regarding disease state education, salt/fluid restriction, sliding scale diuretics, and medication compliance.

## 2017-11-21 ENCOUNTER — Encounter (HOSPITAL_COMMUNITY): Payer: Self-pay

## 2017-11-21 ENCOUNTER — Ambulatory Visit (HOSPITAL_COMMUNITY)
Admission: RE | Admit: 2017-11-21 | Discharge: 2017-11-21 | Disposition: A | Discharge status: Home / Self Care | Payer: Medicare HMO | Source: Ambulatory Visit | Attending: Cardiology | Admitting: Cardiology

## 2017-11-21 VITALS — BP 112/54 | HR 77 | Wt 128.0 lb

## 2017-11-21 DIAGNOSIS — Z7902 Long term (current) use of antithrombotics/antiplatelets: Secondary | ICD-10-CM | POA: Diagnosis not present

## 2017-11-21 DIAGNOSIS — I255 Ischemic cardiomyopathy: Secondary | ICD-10-CM | POA: Insufficient documentation

## 2017-11-21 DIAGNOSIS — I5022 Chronic systolic (congestive) heart failure: Secondary | ICD-10-CM | POA: Diagnosis not present

## 2017-11-21 DIAGNOSIS — N183 Chronic kidney disease, stage 3 unspecified: Secondary | ICD-10-CM

## 2017-11-21 DIAGNOSIS — I493 Ventricular premature depolarization: Secondary | ICD-10-CM

## 2017-11-21 DIAGNOSIS — I2510 Atherosclerotic heart disease of native coronary artery without angina pectoris: Secondary | ICD-10-CM | POA: Diagnosis not present

## 2017-11-21 DIAGNOSIS — Z95810 Presence of automatic (implantable) cardiac defibrillator: Secondary | ICD-10-CM | POA: Insufficient documentation

## 2017-11-21 DIAGNOSIS — I480 Paroxysmal atrial fibrillation: Secondary | ICD-10-CM | POA: Diagnosis not present

## 2017-11-21 DIAGNOSIS — I130 Hypertensive heart and chronic kidney disease with heart failure and stage 1 through stage 4 chronic kidney disease, or unspecified chronic kidney disease: Secondary | ICD-10-CM | POA: Insufficient documentation

## 2017-11-21 DIAGNOSIS — E785 Hyperlipidemia, unspecified: Secondary | ICD-10-CM | POA: Diagnosis not present

## 2017-11-21 DIAGNOSIS — Z951 Presence of aortocoronary bypass graft: Secondary | ICD-10-CM | POA: Insufficient documentation

## 2017-11-21 DIAGNOSIS — Z79899 Other long term (current) drug therapy: Secondary | ICD-10-CM | POA: Diagnosis not present

## 2017-11-21 DIAGNOSIS — I25708 Atherosclerosis of coronary artery bypass graft(s), unspecified, with other forms of angina pectoris: Secondary | ICD-10-CM | POA: Diagnosis not present

## 2017-11-21 DIAGNOSIS — Z7982 Long term (current) use of aspirin: Secondary | ICD-10-CM | POA: Diagnosis not present

## 2017-11-21 DIAGNOSIS — E1122 Type 2 diabetes mellitus with diabetic chronic kidney disease: Secondary | ICD-10-CM | POA: Insufficient documentation

## 2017-11-21 DIAGNOSIS — E039 Hypothyroidism, unspecified: Secondary | ICD-10-CM | POA: Diagnosis not present

## 2017-11-21 DIAGNOSIS — Z794 Long term (current) use of insulin: Secondary | ICD-10-CM | POA: Diagnosis not present

## 2017-11-21 DIAGNOSIS — I252 Old myocardial infarction: Secondary | ICD-10-CM | POA: Insufficient documentation

## 2017-11-21 DIAGNOSIS — F329 Major depressive disorder, single episode, unspecified: Secondary | ICD-10-CM | POA: Insufficient documentation

## 2017-11-21 LAB — BASIC METABOLIC PANEL
Anion gap: 8 (ref 5–15)
BUN: 26 mg/dL — AB (ref 8–23)
CHLORIDE: 99 mmol/L (ref 98–111)
CO2: 31 mmol/L (ref 22–32)
CREATININE: 1.11 mg/dL — AB (ref 0.44–1.00)
Calcium: 10 mg/dL (ref 8.9–10.3)
GFR calc Af Amer: 56 mL/min — ABNORMAL LOW (ref >60)
GFR calc non Af Amer: 48 mL/min — ABNORMAL LOW (ref >60)
GLUCOSE: 275 mg/dL — AB (ref 70–99)
POTASSIUM: 4.6 mmol/L (ref 3.5–5.1)
SODIUM: 138 mmol/L (ref 135–145)

## 2017-11-21 MED ORDER — AMIODARONE HCL 200 MG PO TABS: 200.0000 mg | ORAL_TABLET | Freq: Two times a day (BID) | ORAL | 3 refills | Status: DC

## 2017-11-21 NOTE — Patient Instructions (Signed)
START Amiodarone 200 mg, one tab twice a day  Labs today We will only contact you if something comes back abnormal or we need to make some changes. Otherwise no news is good news!  Your physician recommends that you schedule a follow-up appointment in: 3-4 weeks with the CHF PharmD 8 weeks with [name],NP   Do the following things EVERYDAY: 1) Weigh yourself in the morning before breakfast. Write it down and keep it in a log. 2) Take your medicines as prescribed 3) Eat low salt foods-Limit salt (sodium) to 2000 mg per day.  4) Stay as active as you can everyday 5) Limit all fluids for the day to less than 2 liters

## 2017-11-28 ENCOUNTER — Ambulatory Visit: Payer: Medicare HMO | Admitting: Internal Medicine

## 2017-11-28 ENCOUNTER — Other Ambulatory Visit (HOSPITAL_COMMUNITY): Payer: Self-pay | Admitting: *Deleted

## 2017-12-18 ENCOUNTER — Ambulatory Visit (HOSPITAL_COMMUNITY)
Admission: RE | Admit: 2017-12-18 | Discharge: 2017-12-18 | Disposition: A | Discharge status: Home / Self Care | Payer: Medicare HMO | Source: Ambulatory Visit | Attending: Internal Medicine | Admitting: Internal Medicine

## 2017-12-18 MED ORDER — LOSARTAN POTASSIUM 25 MG PO TABS: 12.5000 mg | ORAL_TABLET | Freq: Every day | ORAL | 5 refills | Status: DC

## 2017-12-18 NOTE — Patient Instructions (Signed)
It was great to meet you today!  Please START losartan 12.5 mg (1/2 tablet) EVERY EVENING.   You are scheduled for blood work on Thursday 8/22.   Please keep your appointment with the NP/PA on 01/16/18.

## 2017-12-18 NOTE — Progress Notes (Signed)
HF [facility]: [facility]  HPI:  Ms. [name] is a 72 yo with history of CAD and ischemic cardiomyopathy presents for followup of CAD and CHF. She had CABG x 4 in 2008.  Echo in 3/13 was 40-45%.  By 10/18, EF had dropped to 20% by echo.    She was admitted in 1/19 with CHF exacerbation/NSTEMI.  LHC showed occluded SVG-PDA and serial 70% and 90% stenoses in the proximal to mid RCA.  She had atherectomy with DES x 2 to RCA.  Echo in 1/19 with EF 20-25%.  She was diuresed and had left thoracentesis for pleural effusion.   She developed lightheadedness/orthostasis and AKI with hyperkalemia. Entresto and spironolactone were stopped.   S/p Medtronic ICD placement 10/05/17.  She presents today for pharmacist-led HF medication titration. At her last HF clinic visit on 7/17, she was started on amiodarone 200 mg BID to reduce PVC burden. Her coreg was supposed to be increased with 20% PVCs on Holter monitor 10/2017 but clinic staff was not able to get in contact with patient so change never made. Overall doing well. Denies SOB with stairs or walking around the house. She is also walking about a block to her friends house to do church work daily with no issues. She has episodes when she is lying flat in bed and she feels like she needs to take a deep breath, but it only occurs when her head is positioned in a certain way. Dizziness continues to improve. It occurs with certain lighting, but not sitting to standing. No falls. Weight is up, but she has been eating a lot of sweats recently. Energy level is good. Limits salt and drinks less than 2L/day. Taking all medications.      . Shortness of breath/dyspnea on exertion? No  . Orthopnea/PND? Yes - using 1 more pillow than usual  . Edema? No . Lightheadedness/dizziness? Yes - stable  . Daily weights at home? Yes - 125-126 lb  . Blood pressure/heart rate monitoring at home? No . Following low-sodium/fluid-restricted diet? Yes  HF Medications: Carvedilol 3.125 mg PO BID   Torsemide 20 mg PO daily   Has the patient been experiencing any side effects to the medications prescribed?  No  Does the patient have any problems obtaining medications due to transportation or finances?   No - [employer] Part D   Understanding of regimen: good Understanding of indications: good Potential of compliance: good Patient understands to avoid NSAIDs. Patient understands to avoid decongestants.    Pertinent Lab Values: . 11/21/17: Serum creatinine 1.11, BUN 26, Potassium 4.6, Sodium 138  Vital Signs: . Weight: 130.8 lb (dry weight: 128 lb) . Blood pressure: 118/56 mmHg (101-121) . Heart rate: 56 bpm   Assessment: 1. Chronicsystolic CHF (EF 16-10%25-30%), due to ICM. NYHA class IIsymptoms. - Volume status stable on Medtronic check - Start low dose losartan 12.5 mg QHS cautiously with history of orthostatic hypotension and hyperkalemia - Recheck BMET in 1 week - Continue carvedilol 3.125 mg BID and torsemide 20 mg daily - Off spiro with hyperkalemia, off Entresto with h/o orthostatic hypotension - Basic disease state pathophysiology, medication indication, mechanism and side effects reviewed at length with patient and he verbalized understanding 2. CAD:  - No s/s ischemia - Continue Plavix, ASA 81 daily. Should continue Plavix long-term.  - Continue simvastatin 40 daily with Zetia 10 daily. Lipids stable 08/13/17.  3. Diabetes: Per PCP. Consider SGLT-2 inhibitor given CHF. No change.  4. Atrial fibrillation: Paroxysmal, noted on post-op CABG.  Will need anticoagulation if it recurs. No change 5. Orthostatic hypotension/dizziness: Resolved after cutting back meds, uses cane. No change 6. PVCs: Multiple PVCs noted on prior ECGs. ?If this could be contributing to cardiomyopathy.   - Holter showed frequent PVCs 10/2017 (20%). Denies palpitations.  - Started on amiodarone 200 mg BID. Discussed need for yearly eye exams. Chart forwarded to Dr Graciela HusbandsKlein 7. CKD: Stage 3. With  hyperkalemia. K 5.0 on 5/28. - SCr stable at 1.11 at last visit and hyperkalemia resolved to 4.6  Plan: 1) Medication changes: Based on clinical presentation, vital signs and recent labs will start losartan 12.5 mg daily 2) Labs: BMET in 1 week  3) Follow-up: PA/NP on 01/16/18   Cicero DuckErika K. Bonnye FavaNicolsen, PharmD, BCPS, CPP Clinical Pharmacist Pager: [phone] Phone: [phone] 12/18/2017 9:11 AM

## 2017-12-27 ENCOUNTER — Ambulatory Visit (HOSPITAL_COMMUNITY)
Admission: RE | Admit: 2017-12-27 | Discharge: 2017-12-27 | Disposition: A | Discharge status: Home / Self Care | Payer: Medicare HMO | Source: Ambulatory Visit | Attending: Internal Medicine | Admitting: Internal Medicine

## 2017-12-27 DIAGNOSIS — I5022 Chronic systolic (congestive) heart failure: Secondary | ICD-10-CM | POA: Diagnosis not present

## 2017-12-27 LAB — BASIC METABOLIC PANEL
Anion gap: 6 (ref 5–15)
BUN: 16 mg/dL (ref 8–23)
CHLORIDE: 103 mmol/L (ref 98–111)
CO2: 32 mmol/L (ref 22–32)
CREATININE: 1.18 mg/dL — AB (ref 0.44–1.00)
Calcium: 9.2 mg/dL (ref 8.9–10.3)
GFR calc non Af Amer: 45 mL/min — ABNORMAL LOW (ref >60)
GFR, EST AFRICAN AMERICAN: 52 mL/min — AB (ref >60)
Glucose, Bld: 99 mg/dL (ref 70–99)
POTASSIUM: 4.7 mmol/L (ref 3.5–5.1)
Sodium: 141 mmol/L (ref 135–145)

## 2017-12-28 ENCOUNTER — Telehealth: Payer: Self-pay | Admitting: Internal Medicine

## 2017-12-28 NOTE — Telephone Encounter (Signed)
Returned call to [place] with [employer].He stated he trying to help patient qualify for 02.Stated if we could check patient's 02 sat off 02 at her office visit with Dr.Hilty 8/28.Stated if 02 sat 88 % or lower she automatically qualifies.Stated Dr.Hilty will need to document in patient's office note.Advised I will send message to Dr.Hilty's RN.

## 2017-12-28 NOTE — Telephone Encounter (Signed)
New Message:   Pt will need a 6 minute walk test: medicare qualification for oxygen

## 2018-01-02 ENCOUNTER — Encounter: Payer: Self-pay | Admitting: Internal Medicine

## 2018-01-02 ENCOUNTER — Ambulatory Visit (INDEPENDENT_AMBULATORY_CARE_PROVIDER_SITE_OTHER): Payer: Medicare HMO | Admitting: Internal Medicine

## 2018-01-02 VITALS — BP 122/56 | HR 59 | Ht 62.0 in | Wt 131.8 lb

## 2018-01-02 DIAGNOSIS — I5022 Chronic systolic (congestive) heart failure: Secondary | ICD-10-CM | POA: Diagnosis not present

## 2018-01-02 DIAGNOSIS — Z95810 Presence of automatic (implantable) cardiac defibrillator: Secondary | ICD-10-CM | POA: Diagnosis not present

## 2018-01-02 DIAGNOSIS — I480 Paroxysmal atrial fibrillation: Secondary | ICD-10-CM | POA: Diagnosis not present

## 2018-01-02 DIAGNOSIS — I255 Ischemic cardiomyopathy: Secondary | ICD-10-CM | POA: Diagnosis not present

## 2018-01-02 DIAGNOSIS — I493 Ventricular premature depolarization: Secondary | ICD-10-CM

## 2018-01-02 NOTE — Progress Notes (Signed)
[name]   November 20, 1945  [id number]  Primary Physicia Dorisann FramesBalan, Bindubal, MD Primary Cardiologist: Dr Rennis GoldenHilty  CC: No complaint  HPI:  [id number] y/o followed by Dr Rennis GoldenHilty with a history of CABG X 4 10/08. Myoview 3/13 low risk for ischemia. She is here for 6 month check up. Since we saw her last she has been doing well, no chest pain, palpitations, or unusual SOB. She last saw Corine ShelterLuke Kilroy who noted that she had discontinued her Synthroid for unknown reasons. She was since seen by her endocrinologist and those were restarted. She has her diabetes and cholesterol checked by her endocrinologist as well. She denies any chest pain or worsening shortness of breath. She does have some leg swelling. When she saw Corine ShelterLuke Kilroy, last in August, her LDL was 20. He recommended she stop Vytorin and switch to low-dose simvastatin. She did not do that.  Tara Norfolkolly returns today for follow-up. She reports no new symptoms. She was last seen in December 2015. She reports fairly good blood sugar control. Blood pressure has been at goal. She denies any chest pain or worsening shortness of breath. She was taking Vytorin and switch to simvastatin and now is been placed back on Vytorin. She takes Lasix once a day. She denies any worsening swelling.  09/05/2016  [name] was seen today in follow-up. Unfortunately she lost her job in February and seems to be suffering some adjustment depression at this time. She denies any new chest pain worsening shortness of breath. She has run out of some of her medications including lisinopril. Blood pressure is elevated today. She is also off of her Vytorin. Weight is up about 4 pounds since her last office visit. She is the primary caretaker for her husband.  01/02/2018  [name] was seen today in follow-up.  She has been recently followed in the heart failure clinic.  She appears much better at this point.  She is been struggling with A. fib as well as PVCs and is on amiodarone.  She  is status post defibrillator and followed by Dr. Graciela HusbandsKlein.  Blood pressures well controlled today 122/56.  She endorses NYHA class II symptoms.  Weight remains stable.  She reports avoidance of salt in her diet.   Current Outpatient Medications  Medication Sig Dispense Refill  . amiodarone (PACERONE) 200 MG tablet Take 1 tablet (200 mg total) by mouth 2 (two) times daily. 60 tablet 3  . aspirin EC 81 MG tablet Take 1 tablet (81 mg total) by mouth daily. 90 tablet 3  . carvedilol (COREG) 3.125 MG tablet Take 1 tablet (3.125 mg total) by mouth 2 (two) times daily with a meal. 180 tablet 3  . cholecalciferol (VITAMIN D) 1000 UNITS tablet Take 1,000 Units by mouth 2 (two) times daily.     . clopidogrel (PLAVIX) 75 MG tablet Take 1 tablet (75 mg total) by mouth daily with breakfast. 90 tablet 1  . Cyanocobalamin (VITAMIN B-12 PO) Take by mouth daily.    Marland Kitchen. ezetimibe (ZETIA) 10 MG tablet Take 1 tablet (10 mg total) by mouth daily. 90 tablet 3  . feeding supplement, ENSURE ENLIVE, (ENSURE ENLIVE) LIQD Take 237 mLs by mouth 3 (three) times daily between meals. (Patient taking differently: Take 237 mLs by mouth daily. ) 237 mL 12  . insulin aspart (NOVOLOG) 100 UNIT/ML injection Inject 0-15 Units into the skin 3 (three) times daily with meals. 10 mL 11  . insulin glargine (LANTUS) 100 UNIT/ML injection Inject  0.06 mLs (6 Units total) into the skin at bedtime. 10 mL 11  . levothyroxine (SYNTHROID, LEVOTHROID) 50 MCG tablet Take 1 tablet (50 mcg total) by mouth daily. 90 tablet 3  . losartan (COZAAR) 25 MG tablet Take 0.5 tablets (12.5 mg total) by mouth daily. 15 tablet 5  . NONFORMULARY OR COMPOUNDED ITEM [facility]:  Onychomycosis Nail Lacquer - Fluconazole 2%, Terbinafine 1%, DMSO, apply daily to affected area. 480 each 11  . simvastatin (ZOCOR) 40 MG tablet Take 1 tablet (40 mg total) by mouth at bedtime. 90 tablet 3  . torsemide (DEMADEX) 20 MG tablet Take 1 tablet (20 mg total) by mouth daily. 90  tablet 3  . venlafaxine XR (EFFEXOR-XR) 150 MG 24 hr capsule Take 1 capsule (150 mg total) by mouth daily. 90 capsule 2  . vitamin C (ASCORBIC ACID) 500 MG tablet Take 500 mg by mouth daily.     No current facility-administered medications for this visit.     Allergies  Allergen Reactions  . Crestor [Rosuvastatin] Other (See Comments)    unknown  . Lipitor [Atorvastatin] Other (See Comments)    Very bad pain in hips and joints    Social History   Socioeconomic History  . Marital status: Married    Spouse name: Not on file  . Number of children: 1  . Years of education: 18  . Highest education level: Not on file  Occupational History    Employer: [employer]  Social Needs  . Financial resource strain: Not on file  . Food insecurity:    Worry: Not on file    Inability: Not on file  . Transportation needs:    Medical: Not on file    Non-medical: Not on file  Tobacco Use  . Smoking status: Never Smoker  . Smokeless tobacco: Never Used  Substance and Sexual Activity  . Alcohol use: No  . Drug use: No  . Sexual activity: Never    Birth control/protection: Abstinence  Lifestyle  . Physical activity:    Days per week: Not on file    Minutes per session: Not on file  . Stress: Not on file  Relationships  . Social connections:    Talks on phone: Not on file    Gets together: Not on file    Attends religious service: Not on file    Active member of club or organization: Not on file    Attends meetings of clubs or organizations: Not on file    Relationship status: Not on file  . Intimate partner violence:    Fear of current or ex partner: Not on file    Emotionally abused: Not on file    Physically abused: Not on file    Forced sexual activity: Not on file  Other Topics Concern  . Not on file  Social History Narrative  . Not on file     Review of Systems: Pertinent items noted in HPI and remainder of comprehensive ROS otherwise negative.   Blood pressure  (!) 122/56, pulse (!) 59, height 5\' 2"  (1.575 m), weight 131 lb 12.8 oz (59.8 kg), SpO2 99 %.  General appearance: alert, no distress and mildly overweight Neck: no carotid bruit and no JVD Lungs: clear to auscultation bilaterally Heart: regular rate and rhythm Abdomen: soft, non-tender; bowel sounds normal; no masses,  no organomegaly Extremities: edema trace pedal and venous stasis dermatitis noted Pulses: 2+ and symmetric Skin: dusky feet Neurologic: Grossly normal Psych: Depressed mood  EKG:  Deferred  ASSESSMENT AND PLAN:   Patient Active Problem List   Diagnosis Date Noted  . Ischemic cardiomyopathy 10/05/2017  . Chronic systolic CHF (congestive heart failure) (HCC) 09/03/2017  . Orthostatic hypotension 09/03/2017  . Pleural effusion   . Status post coronary artery stent placement   . Status post thoracentesis   . NSTEMI (non-ST elevated myocardial infarction) (HCC)   . NSVT (nonsustained ventricular tachycardia) (HCC) 02/08/2017  . SOB (shortness of breath)   . Hypertensive heart disease without heart failure   . 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 Diabetes (HCC) 12/17/2012  . Essential hypertension 12/17/2012  . PAF (paroxysmal atrial fibrillation) (HCC) 12/17/2012  . Dyslipidemia 12/17/2012  . Hypothyroid 12/17/2012  . Depression 12/17/2012    PLAN  1.  [name] has an advanced cardiomyopathy and is followed in the heart failure clinic.  She reports stable weight and appears euvolemic on exam.  She is status post defibrillator.  At this point she endorses NYHA class II symptoms.  Given her close follow-up in the heart failure clinic as well as with Dr. Graciela Husbands in cardiac electrophysiology, I will not be primarily following her as her cardiologist in the future.  I encouraged her to continue to primarily be followed in the advanced heart failure clinic.  No medication changes were made today.  Chrystie Nose, MD, [facility], FACP  [place]  [facility]  Medical  Director of the Advanced Lipid Disorders &  Cardiovascular Risk Reduction Clinic Diplomate of the American Board of Clinical Lipidology Attending Cardiologist  Direct Dial: [phone]  Fax: [phone]  Website:  [email or web address] 01/02/2018 1:51 PM

## 2018-01-02 NOTE — Telephone Encounter (Signed)
Notified Morrie Sheldonshley with [facility] that patient was seen in office today and was advised that she will need to see her pulmonologist or PCP for oxygen orders. He states patient has had O2 since March and they are needing documentation from a provider that she still needs this. Explained that pulmonologist last note provided instructions about home O2 use and that she is due for an appt with them as of July 2019.

## 2018-01-02 NOTE — Patient Instructions (Signed)
Your physician recommends that you schedule a follow-up appointment as needed with Dr. Hilty.  

## 2018-01-03 ENCOUNTER — Encounter: Payer: Self-pay | Admitting: Internal Medicine

## 2018-01-08 ENCOUNTER — Encounter: Payer: Medicare HMO | Admitting: Internal Medicine

## 2018-01-16 ENCOUNTER — Ambulatory Visit (HOSPITAL_COMMUNITY)
Admission: RE | Admit: 2018-01-16 | Discharge: 2018-01-16 | Disposition: A | Discharge status: Home / Self Care | Payer: Medicare HMO | Source: Ambulatory Visit | Attending: Adult Health | Admitting: Adult Health

## 2018-01-16 ENCOUNTER — Ambulatory Visit (HOSPITAL_COMMUNITY)
Admission: RE | Admit: 2018-01-16 | Discharge: 2018-01-16 | Disposition: A | Discharge status: Home / Self Care | Payer: Medicare HMO | Source: Ambulatory Visit | Attending: Cardiology | Admitting: Cardiology

## 2018-01-16 VITALS — BP 132/78 | HR 64 | Wt 132.0 lb

## 2018-01-16 DIAGNOSIS — I480 Paroxysmal atrial fibrillation: Secondary | ICD-10-CM | POA: Diagnosis not present

## 2018-01-16 DIAGNOSIS — I130 Hypertensive heart and chronic kidney disease with heart failure and stage 1 through stage 4 chronic kidney disease, or unspecified chronic kidney disease: Secondary | ICD-10-CM | POA: Insufficient documentation

## 2018-01-16 DIAGNOSIS — F329 Major depressive disorder, single episode, unspecified: Secondary | ICD-10-CM | POA: Diagnosis not present

## 2018-01-16 DIAGNOSIS — E875 Hyperkalemia: Secondary | ICD-10-CM | POA: Diagnosis not present

## 2018-01-16 DIAGNOSIS — J90 Pleural effusion, not elsewhere classified: Secondary | ICD-10-CM | POA: Diagnosis not present

## 2018-01-16 DIAGNOSIS — Z7902 Long term (current) use of antithrombotics/antiplatelets: Secondary | ICD-10-CM | POA: Diagnosis not present

## 2018-01-16 DIAGNOSIS — Z79899 Other long term (current) drug therapy: Secondary | ICD-10-CM | POA: Insufficient documentation

## 2018-01-16 DIAGNOSIS — Z794 Long term (current) use of insulin: Secondary | ICD-10-CM | POA: Insufficient documentation

## 2018-01-16 DIAGNOSIS — I454 Nonspecific intraventricular block: Secondary | ICD-10-CM | POA: Diagnosis not present

## 2018-01-16 DIAGNOSIS — I951 Orthostatic hypotension: Secondary | ICD-10-CM | POA: Insufficient documentation

## 2018-01-16 DIAGNOSIS — N183 Chronic kidney disease, stage 3 unspecified: Secondary | ICD-10-CM

## 2018-01-16 DIAGNOSIS — I493 Ventricular premature depolarization: Secondary | ICD-10-CM

## 2018-01-16 DIAGNOSIS — E1122 Type 2 diabetes mellitus with diabetic chronic kidney disease: Secondary | ICD-10-CM | POA: Insufficient documentation

## 2018-01-16 DIAGNOSIS — E785 Hyperlipidemia, unspecified: Secondary | ICD-10-CM | POA: Insufficient documentation

## 2018-01-16 DIAGNOSIS — E039 Hypothyroidism, unspecified: Secondary | ICD-10-CM | POA: Diagnosis not present

## 2018-01-16 DIAGNOSIS — Z95810 Presence of automatic (implantable) cardiac defibrillator: Secondary | ICD-10-CM | POA: Diagnosis not present

## 2018-01-16 DIAGNOSIS — I2510 Atherosclerotic heart disease of native coronary artery without angina pectoris: Secondary | ICD-10-CM | POA: Diagnosis not present

## 2018-01-16 DIAGNOSIS — I5022 Chronic systolic (congestive) heart failure: Secondary | ICD-10-CM | POA: Diagnosis not present

## 2018-01-16 DIAGNOSIS — I252 Old myocardial infarction: Secondary | ICD-10-CM | POA: Diagnosis not present

## 2018-01-16 DIAGNOSIS — Z7982 Long term (current) use of aspirin: Secondary | ICD-10-CM | POA: Insufficient documentation

## 2018-01-16 DIAGNOSIS — Z951 Presence of aortocoronary bypass graft: Secondary | ICD-10-CM | POA: Diagnosis not present

## 2018-01-16 DIAGNOSIS — I255 Ischemic cardiomyopathy: Secondary | ICD-10-CM | POA: Diagnosis not present

## 2018-01-16 LAB — TSH: TSH: 6.023 u[IU]/mL — ABNORMAL HIGH (ref 0.350–4.500)

## 2018-01-16 LAB — BASIC METABOLIC PANEL
Anion gap: 9 (ref 5–15)
BUN: 25 mg/dL — ABNORMAL HIGH (ref 8–23)
CO2: 29 mmol/L (ref 22–32)
Calcium: 9.5 mg/dL (ref 8.9–10.3)
Chloride: 102 mmol/L (ref 98–111)
Creatinine, Ser: 1.23 mg/dL — ABNORMAL HIGH (ref 0.44–1.00)
GFR calc Af Amer: 49 mL/min — ABNORMAL LOW (ref >60)
GFR calc non Af Amer: 42 mL/min — ABNORMAL LOW (ref >60)
GLUCOSE: 96 mg/dL (ref 70–99)
POTASSIUM: 4.2 mmol/L (ref 3.5–5.1)
Sodium: 140 mmol/L (ref 135–145)

## 2018-01-16 LAB — HEPATIC FUNCTION PANEL
ALBUMIN: 3.6 g/dL (ref 3.5–5.0)
ALT: 61 U/L — ABNORMAL HIGH (ref 0–44)
AST: 50 U/L — AB (ref 15–41)
Alkaline Phosphatase: 100 U/L (ref 38–126)
BILIRUBIN TOTAL: 0.6 mg/dL (ref 0.3–1.2)
Bilirubin, Direct: 0.1 mg/dL (ref 0.0–0.2)
Indirect Bilirubin: 0.5 mg/dL (ref 0.3–0.9)
Total Protein: 7.3 g/dL (ref 6.5–8.1)

## 2018-01-16 LAB — BRAIN NATRIURETIC PEPTIDE: B Natriuretic Peptide: 662.4 pg/mL — ABNORMAL HIGH (ref 0.0–100.0)

## 2018-01-16 MED ORDER — AMIODARONE HCL 200 MG PO TABS: 200.0000 mg | ORAL_TABLET | Freq: Every day | ORAL | 3 refills | Status: DC

## 2018-01-16 MED ORDER — APIXABAN 5 MG PO TABS: 5.0000 mg | ORAL_TABLET | Freq: Two times a day (BID) | ORAL | 3 refills | Status: DC

## 2018-01-16 MED ORDER — CARVEDILOL 6.25 MG PO TABS: 6.2500 mg | ORAL_TABLET | Freq: Two times a day (BID) | ORAL | 3 refills | Status: DC

## 2018-01-16 NOTE — Patient Instructions (Addendum)
Labs today (will call for abnormal results, otherwise no news is good news)  Chest Xray has been ordered for you today to check device lead placement.  DECREASE Amiodarone 200 mg Once Daily  INCREASE Carvedilol to 6.25 mg Twice Daily  STOP taking Aspirin  START taking Eliquis 5 mg (1 Tablet) Twice Daily  Follow up in 6 weeks.

## 2018-01-16 NOTE — Progress Notes (Signed)
Advanced Heart Failure Clinic Note  PCP: Dorisann Frames, MD Cardiology: Dr. Rennis Golden HF Cardiology: Dr. Shirlee Latch EP: Dr Graciela Husbands  72 yo with history of CAD and ischemic cardiomyopathy presents for followup of CAD and CHF.  She had CABG x 4 in 2008.  Echo in 3/13 was 40-45%.  By 10/18, EF had dropped to 20% by echo.    She was admitted in 1/19 with CHF exacerbation/NSTEMI.  LHC showed occluded SVG-PDA and serial 70% and 90% stenoses in the proximal to mid RCA.  She had atherectomy with DES x 2 to RCA.  Echo in 1/19 with EF 20-25%.  She was diuresed and had left thoracentesis for pleural effusion.   She developed lightheadedness/orthostasis and AKI with hyperkalemia. Entresto and spironolactone were stopped.   S/p Medtronic ICD placement 10/05/17.  Today she returns for HF follow up. Overall feeling fine. She has noticed that her ICD has dropped down from original placement. Denies pain. Denies SOB/PND/Orthopnea. Able to walk 2 blocks but after 2 blocks she complains of fatigue.  Able to walk up steps. Appetite ok. No fever or chills. Weight at home 130-132 pounds. Taking all medications.   Labs (1/19): K 3.9, creatinine 0.88 Labs (4/19): K 5.1, Creatinine 1.31 Labs (5/19): K 5.8 => 5.5, creatinine 1.21 Labs (12/27/2017): K 4.7 Creatinine 1.18   PMH: 1. CAD: s/p CABG x 4 in 2008.  - NSTEMI 1/19 with LHC showing patent LIMA-LAD, 80% proximal LAD, 70% proximal RCA, 90% mid RCA, SVG-ramus patent, SVG-OM patent, SVG-PDA occluded.  Patient had atherectomy with DES x 2 to prox and mid RCA.   2. Chronic systolic CHF: Ischemic cardiomyopathy.  - Echo (3/13): EF 45-50%.  - Echo (10/18): EF 20%.  - Echo (1/19): EF 20-25%, diffuse hypokinesis, mild LV dilation.  - Echo (4/19): EF 25-30%.  3. HTN 4. H/o left pleural effusion 5. Hyperlipidemia 6. Type II diabetes 7. Atrial fibrillation: Paroxysmal, noted only post-op CABG.  8. Hypothyroidism 9. Depression 10. Hyperkalemia with spironolactone.  11.  PAD: ABIs 5/19 with normal ABI on right, left noncompressible.   Review of systems complete and found to be negative unless listed in HPI.   Social History   Socioeconomic History  . Marital status: Married    Spouse name: Not on file  . Number of children: 1  . Years of education: 49  . Highest education level: Not on file  Occupational History    Employer: [employer]  Social Needs  . Financial resource strain: Not on file  . Food insecurity:    Worry: Not on file    Inability: Not on file  . Transportation needs:    Medical: Not on file    Non-medical: Not on file  Tobacco Use  . Smoking status: Never Smoker  . Smokeless tobacco: Never Used  Substance and Sexual Activity  . Alcohol use: No  . Drug use: No  . Sexual activity: Never    Birth control/protection: Abstinence  Lifestyle  . Physical activity:    Days per week: Not on file    Minutes per session: Not on file  . Stress: Not on file  Relationships  . Social connections:    Talks on phone: Not on file    Gets together: Not on file    Attends religious service: Not on file    Active member of club or organization: Not on file    Attends meetings of clubs or organizations: Not on file    Relationship status: Not  on file  . Intimate partner violence:    Fear of current or ex partner: Not on file    Emotionally abused: Not on file    Physically abused: Not on file    Forced sexual activity: Not on file  Other Topics Concern  . Not on file  Social History Narrative  . Not on file   Family History  Problem Relation Age of Onset  . Diabetes Mother 42  . Heart disease Mother   . Hyperlipidemia Mother   . Cancer Father   . Heart attack Father 85  . Cancer Maternal Grandmother   . Cancer Paternal Grandmother   . Thyroid disease Sister   . Diabetes Child     Current Outpatient Medications  Medication Sig Dispense Refill  . amiodarone (PACERONE) 200 MG tablet Take 1 tablet (200 mg total) by mouth  2 (two) times daily. 60 tablet 3  . aspirin EC 81 MG tablet Take 1 tablet (81 mg total) by mouth daily. 90 tablet 3  . carvedilol (COREG) 3.125 MG tablet Take 1 tablet (3.125 mg total) by mouth 2 (two) times daily with a meal. 180 tablet 3  . cholecalciferol (VITAMIN D) 1000 UNITS tablet Take 1,000 Units by mouth 2 (two) times daily.     . clopidogrel (PLAVIX) 75 MG tablet Take 1 tablet (75 mg total) by mouth daily with breakfast. 90 tablet 1  . Cyanocobalamin (VITAMIN B-12 PO) Take by mouth daily.    Marland Kitchen ezetimibe (ZETIA) 10 MG tablet Take 1 tablet (10 mg total) by mouth daily. 90 tablet 3  . feeding supplement, ENSURE ENLIVE, (ENSURE ENLIVE) LIQD Take 237 mLs by mouth 3 (three) times daily between meals. (Patient taking differently: Take 237 mLs by mouth daily. ) 237 mL 12  . insulin aspart (NOVOLOG) 100 UNIT/ML injection Inject 0-15 Units into the skin 3 (three) times daily with meals. 10 mL 11  . insulin glargine (LANTUS) 100 UNIT/ML injection Inject 0.06 mLs (6 Units total) into the skin at bedtime. 10 mL 11  . levothyroxine (SYNTHROID, LEVOTHROID) 50 MCG tablet Take 1 tablet (50 mcg total) by mouth daily. 90 tablet 3  . losartan (COZAAR) 25 MG tablet Take 0.5 tablets (12.5 mg total) by mouth daily. 15 tablet 5  . NONFORMULARY OR COMPOUNDED ITEM [facility]:  Onychomycosis Nail Lacquer - Fluconazole 2%, Terbinafine 1%, DMSO, apply daily to affected area. 480 each 11  . simvastatin (ZOCOR) 40 MG tablet Take 1 tablet (40 mg total) by mouth at bedtime. 90 tablet 3  . torsemide (DEMADEX) 20 MG tablet Take 1 tablet (20 mg total) by mouth daily. 90 tablet 3  . venlafaxine XR (EFFEXOR-XR) 150 MG 24 hr capsule Take 1 capsule (150 mg total) by mouth daily. 90 capsule 2  . vitamin C (ASCORBIC ACID) 500 MG tablet Take 500 mg by mouth daily.     No current facility-administered medications for this encounter.    Vitals:   01/16/18 1047  BP: 132/78  Pulse: 64  SpO2: 97%  Weight: 59.9 kg (132 lb)    Wt Readings from Last 3 Encounters:  01/16/18 59.9 kg (132 lb)  01/02/18 59.8 kg (131 lb 12.8 oz)  12/18/17 59.3 kg (130 lb 12.8 oz)   General:  No resp difficulty HEENT: normal Neck: supple. no JVD. Carotids 2+ bilat; no bruits. No lymphadenopathy or thryomegaly appreciated. Cor: PMI nondisplaced. Regular rate & rhythm. No rubs, gallops or murmurs. Lungs: clear Abdomen: soft, nontender, nondistended. No hepatosplenomegaly. No  bruits or masses. Good bowel sounds. Extremities: no cyanosis, clubbing, rash, edema Neuro: alert & orientedx3, cranial nerves grossly intact. moves all 4 extremities w/o difficulty. Affect pleasant  EKG: NSR 61 bpm Assessment/Plan: 1. Chronic systolic CHF: Ischemic cardiomyopathy.  Echo 09/03/17 with EF 25-30%.  Medtronic ICD placement 10/05/17. 2 view PA lateral to assess ICD because it seems to have drifted down.  Optivol: Fluid trending up to threshold. A fib /PVCs 40% up until mid July. Activity <1 hour. No VT.  - NYHA II. Volume status ok. Continue torsemide 20 mg daily.  - Increase coreg to 6.25 mg twice a day.  - Continue 12.5 mg losartan daily.  - Had orthostatic hypotension with Entresto. - Off spiro with hyperkalemia - Continue compression hose.  2. CAD:  -No s/s ischemia - Continue Plavix, Should continue Plavix until January 2020.  - Stop aspirin with addition of eliquis.   - Continue simvastatin 40 daily with Zetia 10 daily. Lipids stable 08/13/17.  3. Diabetes: Per PCP. Consider SGLT-2 inhibitor given CHF. No change.  4. Atrial fibrillation: Paroxysmal, noted on post-op CABG.  Medtronic Interrogated- Looks like she has been in A fib 40% but with addition of amiodarone A fib reduced to 10%.  - Start eliquis 5 mg twice a day.  5. Orthostatic hypotension/dizziness:  Resolved. 6. PVCs: Multiple PVCs noted on prior ECGs. ?If this could be contributing to cardiomyopathy.   - Holter showed frequent PVCs 10/2017 (20%). No PVCs on EKG.  - Cut back amio  to 200 mg daily.  - Check TSH, LFTs. - She understands she needs yearly eye exams.  7. CKD: Stage 3.  Check BMET   Greater than 50% of the visit was spent in counseling/coordination of care regarding disease state education, salt/fluid restriction, sliding scale diuretics, and medication compliance. Discussed medications changes and device interrogation.    Follow up in 6 weeks.   Tonye Becket, NP  01/16/2018

## 2018-01-17 ENCOUNTER — Telehealth (HOSPITAL_COMMUNITY): Payer: Self-pay | Admitting: *Deleted

## 2018-01-17 NOTE — Telephone Encounter (Signed)
Patient left vm requesting call back about Torsemide dose. Called patient back no answer/no voicemail.

## 2018-02-05 ENCOUNTER — Other Ambulatory Visit: Payer: Self-pay

## 2018-02-05 MED ORDER — CLOPIDOGREL BISULFATE 75 MG PO TABS: 75.0000 mg | ORAL_TABLET | Freq: Every day | ORAL | 3 refills | Status: DC

## 2018-02-07 ENCOUNTER — Encounter (HOSPITAL_COMMUNITY): Payer: Self-pay | Admitting: Emergency Medicine

## 2018-02-07 ENCOUNTER — Emergency Department (HOSPITAL_COMMUNITY): Payer: Medicare HMO

## 2018-02-07 ENCOUNTER — Other Ambulatory Visit: Payer: Self-pay

## 2018-02-07 ENCOUNTER — Inpatient Hospital Stay (HOSPITAL_COMMUNITY)
Admission: EM | Admit: 2018-02-07 | Discharge: 2018-02-10 | DRG: 193 | Disposition: A | Discharge status: Home Health | Payer: Medicare HMO | Attending: Internal Medicine | Admitting: Internal Medicine

## 2018-02-07 DIAGNOSIS — E1152 Type 2 diabetes mellitus with diabetic peripheral angiopathy with gangrene: Secondary | ICD-10-CM | POA: Diagnosis not present

## 2018-02-07 DIAGNOSIS — Z23 Encounter for immunization: Secondary | ICD-10-CM | POA: Diagnosis not present

## 2018-02-07 DIAGNOSIS — N183 Chronic kidney disease, stage 3 (moderate): Secondary | ICD-10-CM | POA: Diagnosis present

## 2018-02-07 DIAGNOSIS — Z95810 Presence of automatic (implantable) cardiac defibrillator: Secondary | ICD-10-CM

## 2018-02-07 DIAGNOSIS — I130 Hypertensive heart and chronic kidney disease with heart failure and stage 1 through stage 4 chronic kidney disease, or unspecified chronic kidney disease: Secondary | ICD-10-CM | POA: Diagnosis not present

## 2018-02-07 DIAGNOSIS — J189 Pneumonia, unspecified organism: Secondary | ICD-10-CM | POA: Diagnosis present

## 2018-02-07 DIAGNOSIS — I5022 Chronic systolic (congestive) heart failure: Secondary | ICD-10-CM | POA: Diagnosis not present

## 2018-02-07 DIAGNOSIS — D649 Anemia, unspecified: Secondary | ICD-10-CM | POA: Diagnosis present

## 2018-02-07 DIAGNOSIS — Z951 Presence of aortocoronary bypass graft: Secondary | ICD-10-CM

## 2018-02-07 DIAGNOSIS — E1122 Type 2 diabetes mellitus with diabetic chronic kidney disease: Secondary | ICD-10-CM | POA: Diagnosis present

## 2018-02-07 DIAGNOSIS — I25708 Atherosclerosis of coronary artery bypass graft(s), unspecified, with other forms of angina pectoris: Secondary | ICD-10-CM

## 2018-02-07 DIAGNOSIS — I480 Paroxysmal atrial fibrillation: Secondary | ICD-10-CM | POA: Diagnosis present

## 2018-02-07 DIAGNOSIS — E1159 Type 2 diabetes mellitus with other circulatory complications: Secondary | ICD-10-CM | POA: Diagnosis not present

## 2018-02-07 DIAGNOSIS — J9621 Acute and chronic respiratory failure with hypoxia: Secondary | ICD-10-CM | POA: Diagnosis present

## 2018-02-07 DIAGNOSIS — Z7901 Long term (current) use of anticoagulants: Secondary | ICD-10-CM | POA: Diagnosis not present

## 2018-02-07 DIAGNOSIS — M545 Low back pain: Secondary | ICD-10-CM | POA: Diagnosis not present

## 2018-02-07 DIAGNOSIS — R2681 Unsteadiness on feet: Secondary | ICD-10-CM | POA: Diagnosis present

## 2018-02-07 DIAGNOSIS — Z79899 Other long term (current) drug therapy: Secondary | ICD-10-CM

## 2018-02-07 DIAGNOSIS — A021 Salmonella sepsis: Secondary | ICD-10-CM | POA: Diagnosis not present

## 2018-02-07 DIAGNOSIS — E1165 Type 2 diabetes mellitus with hyperglycemia: Secondary | ICD-10-CM | POA: Diagnosis present

## 2018-02-07 DIAGNOSIS — Z888 Allergy status to other drugs, medicaments and biological substances status: Secondary | ICD-10-CM

## 2018-02-07 DIAGNOSIS — E039 Hypothyroidism, unspecified: Secondary | ICD-10-CM | POA: Diagnosis present

## 2018-02-07 DIAGNOSIS — R296 Repeated falls: Secondary | ICD-10-CM | POA: Diagnosis present

## 2018-02-07 DIAGNOSIS — E785 Hyperlipidemia, unspecified: Secondary | ICD-10-CM | POA: Diagnosis not present

## 2018-02-07 DIAGNOSIS — I2510 Atherosclerotic heart disease of native coronary artery without angina pectoris: Secondary | ICD-10-CM | POA: Diagnosis present

## 2018-02-07 DIAGNOSIS — Z7902 Long term (current) use of antithrombotics/antiplatelets: Secondary | ICD-10-CM

## 2018-02-07 DIAGNOSIS — I10 Essential (primary) hypertension: Secondary | ICD-10-CM | POA: Diagnosis not present

## 2018-02-07 DIAGNOSIS — Z794 Long term (current) use of insulin: Secondary | ICD-10-CM | POA: Diagnosis not present

## 2018-02-07 DIAGNOSIS — I25810 Atherosclerosis of coronary artery bypass graft(s) without angina pectoris: Secondary | ICD-10-CM | POA: Diagnosis not present

## 2018-02-07 DIAGNOSIS — R079 Chest pain, unspecified: Secondary | ICD-10-CM | POA: Diagnosis not present

## 2018-02-07 DIAGNOSIS — E119 Type 2 diabetes mellitus without complications: Secondary | ICD-10-CM

## 2018-02-07 DIAGNOSIS — J9601 Acute respiratory failure with hypoxia: Secondary | ICD-10-CM | POA: Diagnosis not present

## 2018-02-07 DIAGNOSIS — I4891 Unspecified atrial fibrillation: Secondary | ICD-10-CM | POA: Diagnosis not present

## 2018-02-07 LAB — CBC
HEMATOCRIT: 31.5 % — AB (ref 36.0–46.0)
HEMOGLOBIN: 9.5 g/dL — AB (ref 12.0–15.0)
MCH: 27.9 pg (ref 26.0–34.0)
MCHC: 30.2 g/dL (ref 30.0–36.0)
MCV: 92.4 fL (ref 78.0–100.0)
PLATELETS: 440 10*3/uL — AB (ref 150–400)
RBC: 3.41 MIL/uL — AB (ref 3.87–5.11)
RDW: 13.2 % (ref 11.5–15.5)
WBC: 15.4 10*3/uL — AB (ref 4.0–10.5)

## 2018-02-07 LAB — BASIC METABOLIC PANEL
Anion gap: 12 (ref 5–15)
BUN: 22 mg/dL (ref 8–23)
CALCIUM: 8.9 mg/dL (ref 8.9–10.3)
CHLORIDE: 96 mmol/L — AB (ref 98–111)
CO2: 28 mmol/L (ref 22–32)
CREATININE: 1.21 mg/dL — AB (ref 0.44–1.00)
GFR calc Af Amer: 50 mL/min — ABNORMAL LOW (ref >60)
GFR calc non Af Amer: 43 mL/min — ABNORMAL LOW (ref >60)
GLUCOSE: 253 mg/dL — AB (ref 70–99)
Potassium: 3.9 mmol/L (ref 3.5–5.1)
Sodium: 136 mmol/L (ref 135–145)

## 2018-02-07 LAB — I-STAT TROPONIN, ED: Troponin i, poc: 0 ng/mL (ref 0.00–0.08)

## 2018-02-07 LAB — INFLUENZA PANEL BY PCR (TYPE A & B)
INFLAPCR: NEGATIVE
INFLBPCR: NEGATIVE

## 2018-02-07 LAB — I-STAT CG4 LACTIC ACID, ED: LACTIC ACID, VENOUS: 1.6 mmol/L (ref 0.5–1.9)

## 2018-02-07 MED ORDER — SODIUM CHLORIDE 0.9 % IV SOLN
2.0000 g | INTRAVENOUS | Status: DC
  Administered 2018-02-07 – 2018-02-09 (×3): 2 g via INTRAVENOUS
  Filled 2018-02-07 (×3): qty 20

## 2018-02-07 MED ORDER — LEVOTHYROXINE SODIUM 50 MCG PO TABS
50.0000 ug | ORAL_TABLET | Freq: Every day | ORAL | Status: DC
  Administered 2018-02-08 – 2018-02-10 (×3): 50 ug via ORAL
  Filled 2018-02-07 (×4): qty 1

## 2018-02-07 MED ORDER — SODIUM CHLORIDE 0.9 % IV SOLN: 1.0000 g | INTRAVENOUS | Status: DC

## 2018-02-07 MED ORDER — SODIUM CHLORIDE 0.9 % IV SOLN
500.0000 mg | Freq: Once | INTRAVENOUS | Status: AC
  Administered 2018-02-07: 500 mg via INTRAVENOUS
  Filled 2018-02-07: qty 500

## 2018-02-07 MED ORDER — INSULIN GLARGINE 100 UNIT/ML ~~LOC~~ SOLN
4.0000 [IU] | Freq: Every day | SUBCUTANEOUS | Status: DC
  Administered 2018-02-08: 4 [IU] via SUBCUTANEOUS
  Filled 2018-02-07: qty 0.04

## 2018-02-07 MED ORDER — SIMVASTATIN 40 MG PO TABS
40.0000 mg | ORAL_TABLET | Freq: Every day | ORAL | Status: DC
  Administered 2018-02-08 – 2018-02-09 (×3): 40 mg via ORAL
  Filled 2018-02-07 (×3): qty 1

## 2018-02-07 MED ORDER — INSULIN ASPART 100 UNIT/ML ~~LOC~~ SOLN
0.0000 [IU] | Freq: Three times a day (TID) | SUBCUTANEOUS | Status: DC
  Administered 2018-02-08: 2 [IU] via SUBCUTANEOUS
  Administered 2018-02-08: 8 [IU] via SUBCUTANEOUS

## 2018-02-07 MED ORDER — VENLAFAXINE HCL ER 150 MG PO CP24
150.0000 mg | ORAL_CAPSULE | Freq: Every day | ORAL | Status: DC
  Administered 2018-02-08 – 2018-02-10 (×3): 150 mg via ORAL
  Filled 2018-02-07: qty 2
  Filled 2018-02-07 (×2): qty 1
  Filled 2018-02-07: qty 2
  Filled 2018-02-07: qty 1

## 2018-02-07 MED ORDER — EZETIMIBE 10 MG PO TABS
10.0000 mg | ORAL_TABLET | Freq: Every day | ORAL | Status: DC
  Administered 2018-02-08 – 2018-02-10 (×3): 10 mg via ORAL
  Filled 2018-02-07 (×3): qty 1

## 2018-02-07 MED ORDER — VITAMIN D 1000 UNITS PO TABS
1000.0000 [IU] | ORAL_TABLET | Freq: Two times a day (BID) | ORAL | Status: DC
  Administered 2018-02-08 – 2018-02-10 (×5): 1000 [IU] via ORAL
  Filled 2018-02-07 (×5): qty 1

## 2018-02-07 MED ORDER — CLOPIDOGREL BISULFATE 75 MG PO TABS
75.0000 mg | ORAL_TABLET | Freq: Every day | ORAL | Status: DC
  Administered 2018-02-08 – 2018-02-10 (×3): 75 mg via ORAL
  Filled 2018-02-07 (×3): qty 1

## 2018-02-07 MED ORDER — ACETAMINOPHEN 325 MG PO TABS
650.0000 mg | ORAL_TABLET | Freq: Once | ORAL | Status: AC
  Administered 2018-02-07: 650 mg via ORAL
  Filled 2018-02-07: qty 2

## 2018-02-07 MED ORDER — AMIODARONE HCL 200 MG PO TABS
200.0000 mg | ORAL_TABLET | Freq: Every day | ORAL | Status: DC
  Administered 2018-02-08 – 2018-02-10 (×3): 200 mg via ORAL
  Filled 2018-02-07 (×3): qty 1

## 2018-02-07 MED ORDER — CARVEDILOL 12.5 MG PO TABS
6.2500 mg | ORAL_TABLET | Freq: Two times a day (BID) | ORAL | Status: DC
  Administered 2018-02-08 – 2018-02-10 (×5): 6.25 mg via ORAL
  Filled 2018-02-07 (×5): qty 1

## 2018-02-07 MED ORDER — AZITHROMYCIN 250 MG PO TABS
500.0000 mg | ORAL_TABLET | ORAL | Status: DC
  Administered 2018-02-08 – 2018-02-09 (×2): 500 mg via ORAL
  Filled 2018-02-07 (×2): qty 2

## 2018-02-07 MED ORDER — SODIUM CHLORIDE 0.9 % IV SOLN: 1.0000 g | Freq: Once | INTRAVENOUS | Status: DC

## 2018-02-07 MED ORDER — APIXABAN 5 MG PO TABS
5.0000 mg | ORAL_TABLET | Freq: Two times a day (BID) | ORAL | Status: DC
  Administered 2018-02-08 – 2018-02-10 (×5): 5 mg via ORAL
  Filled 2018-02-07 (×5): qty 1

## 2018-02-07 MED ORDER — ENSURE ENLIVE PO LIQD
237.0000 mL | Freq: Every day | ORAL | Status: DC
  Administered 2018-02-08: 237 mL via ORAL

## 2018-02-07 NOTE — ED Triage Notes (Signed)
Patient reports intermittent central chest pain for 2 weeks with low back pain ( fell last week) , mild SOB and productive cough , febrile with no nausea or diaphoresis .

## 2018-02-07 NOTE — H&P (Addendum)
History and Physical    [name] ZOX:[id number] DOB: [birth year] DOA: 02/07/2018  PCP: Dorisann Frames, MD  Patient coming from: Home  I have personally briefly reviewed patient's old medical records in [facility]  Chief Complaint: Cough, SOB  HPI: [name] is a 72 y.o. female with medical history significant of CHF s/p AICD, CABG, PAF, ICM, DM2, HTN.  Patient presents to the ED with 2 week history of worsening cough, congestion, SOB.  Cough productive of yellow sputum.  Significant worsening over past 1-2 days with fever and generalized weakness.  Diffuse chest pain worse with cough, non-exertional.   ED Course: Tm 102.8, WBC 15.4k, creat 1.2 looks like 1.1 is baseline.  CXR shows Bibasilar infiltrates with tiny pleural effusion, patient put on rocephin / azithro for CAP.   Review of Systems: As per HPI otherwise 10 point review of systems negative.   Past Medical History:  Diagnosis Date  . Cholelithiases   . Coronary artery disease 02/2007   CABG  . Diabetes (HCC)   . Dyslipidemia   . HTN (hypertension)   . Hypothyroid   . PAF (paroxysmal atrial fibrillation) (HCC)    post op  . S/P CABG x 4 2008    Past Surgical History:  Procedure Laterality Date  . ABDOMINAL HYSTERECTOMY    . CARDIAC CATHETERIZATION  03/01/2007   LAD with 99% prox lesion, at birfucation of Cfx there is 99% lesion, at bifurcation of large OM1 there is 99% lesion, 70% prox lesion at ramus intermedius, 90% lesion in prox RCA (Dr. Claudia Desanctis) - susequent CABG  . CORONARY ARTERY BYPASS GRAFT  03/05/2007   LIMA-LAD, SVG-ramus intermediate. SVG-OM, SVG-PDA (Dr. Donata Clay)  . CORONARY ATHERECTOMY N/A 05/17/2017   Procedure: CORONARY ATHERECTOMY;  Surgeon: Lennette Bihari, MD;  Location: [facility] INVASIVE CV LAB;  Service: Cardiovascular;  Laterality: N/A;  . CORONARY STENT INTERVENTION N/A 05/17/2017   Procedure: CORONARY STENT INTERVENTION;  Surgeon: Lennette Bihari, MD;  Location: [facility] INVASIVE CV  LAB;  Service: Cardiovascular;  Laterality: N/A;  . ICD IMPLANT N/A 10/05/2017   Procedure: ICD IMPLANT;  Surgeon: Duke Salvia, MD;  Location: [facility] INVASIVE CV LAB;  Service: Cardiovascular;  Laterality: N/A;  . INCONTINENCE SURGERY  2009  . IR ANGIOGRAM SELECTIVE EACH ADDITIONAL VESSEL  01/31/2017  . IR ANGIOGRAM SELECTIVE EACH ADDITIONAL VESSEL  01/31/2017  . IR ANGIOGRAM VISCERAL SELECTIVE  01/31/2017  . IR EMBO ART  VEN HEMORR LYMPH EXTRAV  INC GUIDE ROADMAPPING  01/31/2017  . IR THORACENTESIS ASP PLEURAL SPACE W/IMG GUIDE  05/11/2017  . IR THORACENTESIS ASP PLEURAL SPACE W/IMG GUIDE  05/18/2017  . IR US GUIDE VASC ACCESS RIGHT  01/31/2017  . LEFT HEART CATH AND CORS/GRAFTS ANGIOGRAPHY N/A 05/15/2017   Procedure: LEFT HEART CATH AND CORS/GRAFTS ANGIOGRAPHY;  Surgeon: Lennette Bihari, MD;  Location: [facility] INVASIVE CV LAB;  Service: Cardiovascular;  Laterality: N/A;  . LEFT HEART CATH AND CORS/GRAFTS ANGIOGRAPHY N/A 05/17/2017   Procedure: LEFT HEART CATH AND CORS/GRAFTS ANGIOGRAPHY;  Surgeon: Lennette Bihari, MD;  Location: [facility] INVASIVE CV LAB;  Service: Cardiovascular;  Laterality: N/A;  . NM MYOCAR PERF WALL MOTION  07/2011   bruce myoview - mild perfusion defect in basal inferolateral & mid inferolateral regions (attenuation artifact, but prior infarct annote be excluded)' EF 49%; low risk  . TRANSTHORACIC ECHOCARDIOGRAM  07/2011   EF 45-50%, mild septal hypokinesis, mild inferior wall hypokinesis; RV systolc function mildly reduced; borderline LA enlargement;  mild MR & TR; AV mildly sclerotic; mild pulm valve regurg      reports that she has never smoked. She has never used smokeless tobacco. She reports that she does not drink alcohol or use drugs.  Allergies  Allergen Reactions  . Crestor [Rosuvastatin] Other (See Comments)    unknown  . Lipitor [Atorvastatin] Other (See Comments)    Very bad pain in hips and joints    Family History  Problem Relation Age of Onset  . Diabetes Mother 110  .  Heart disease Mother   . Hyperlipidemia Mother   . Cancer Father   . Heart attack Father 42  . Cancer Maternal Grandmother   . Cancer Paternal Grandmother   . Thyroid disease Sister   . Diabetes Child      Prior to Admission medications   Medication Sig Start Date End Date Taking? Authorizing Provider  amiodarone (PACERONE) 200 MG tablet Take 1 tablet (200 mg total) by mouth daily. 01/16/18  Yes Laurey Morale, MD  apixaban (ELIQUIS) 5 MG TABS tablet Take 1 tablet (5 mg total) by mouth 2 (two) times daily. 01/16/18  Yes Clegg, Amy D, NP  carvedilol (COREG) 6.25 MG tablet Take 1 tablet (6.25 mg total) by mouth 2 (two) times daily with a meal. 01/16/18  Yes Laurey Morale, MD  cholecalciferol (VITAMIN D) 1000 UNITS tablet Take 1,000 Units by mouth 2 (two) times daily.    Yes [provider]  clopidogrel (PLAVIX) 75 MG tablet Take 1 tablet (75 mg total) by mouth daily with breakfast. 02/05/18  Yes Hilty, Lisette Abu, MD  Cyanocobalamin (VITAMIN B-12 PO) Take by mouth daily.   Yes [provider]  ezetimibe (ZETIA) 10 MG tablet Take 1 tablet (10 mg total) by mouth daily. 07/09/17 12/19/18 Yes Jodelle Gross, NP  feeding supplement, ENSURE ENLIVE, (ENSURE ENLIVE) LIQD Take 237 mLs by mouth 3 (three) times daily between meals. Patient taking differently: Take 237 mLs by mouth daily.  02/19/17  Yes Meuth, Brooke A, PA-C  insulin aspart (NOVOLOG) 100 UNIT/ML injection Inject 0-15 Units into the skin 3 (three) times daily with meals. 02/19/17  Yes Meuth, Brooke A, PA-C  insulin glargine (LANTUS) 100 UNIT/ML injection Inject 0.06 mLs (6 Units total) into the skin at bedtime. 05/20/17  Yes Barnetta Chapel, MD  levothyroxine (SYNTHROID, LEVOTHROID) 50 MCG tablet Take 1 tablet (50 mcg total) by mouth daily. 07/11/17  Yes Hilty, Lisette Abu, MD  losartan (COZAAR) 25 MG tablet Take 0.5 tablets (12.5 mg total) by mouth daily. 12/18/17  Yes Laurey Morale, MD  simvastatin (ZOCOR) 40 MG  tablet Take 1 tablet (40 mg total) by mouth at bedtime. 07/11/17 12/19/18 Yes Hilty, Lisette Abu, MD  torsemide (DEMADEX) 20 MG tablet Take 1 tablet (20 mg total) by mouth daily. 09/03/17  Yes Graciella Freer, PA-C  venlafaxine XR (EFFEXOR-XR) 150 MG 24 hr capsule Take 1 capsule (150 mg total) by mouth daily. 07/12/17  Yes Hilty, Lisette Abu, MD  vitamin C (ASCORBIC ACID) 500 MG tablet Take 500 mg by mouth daily.   Yes [provider]    Physical Exam: Vitals:   02/07/18 2115 02/07/18 2130 02/07/18 2200 02/07/18 2230  BP: (!) 139/53 132/69  (!) 129/48  Pulse: 66 64 66 64  Resp: 18 16 (!) 22 18  Temp:      TempSrc:      SpO2: 95% (!) 86% 93% 93%  Weight:      Height:  Constitutional: NAD, calm, comfortable Eyes: PERRL, lids and conjunctivae normal ENMT: Mucous membranes are moist. Posterior pharynx clear of any exudate or lesions.Normal dentition.  Neck: normal, supple, no masses, no thyromegaly Respiratory: Bibasilar crackles Cardiovascular: Regular rate and rhythm, no murmurs / rubs / gallops. No extremity edema. 2+ pedal pulses. No carotid bruits.  Abdomen: no tenderness, no masses palpated. No hepatosplenomegaly. Bowel sounds positive.  Musculoskeletal: no clubbing / cyanosis. No joint deformity upper and lower extremities. Good ROM, no contractures. Normal muscle tone.  Skin: no rashes, lesions, ulcers. No induration Neurologic: CN 2-12 grossly intact. Sensation intact, DTR normal. Strength 5/5 in all 4.  Psychiatric: Normal judgment and insight. Alert and oriented x 3. Normal mood.    Labs on Admission: I have personally reviewed following labs and imaging studies  CBC: Recent Labs  Lab 02/07/18 2029  WBC 15.4*  HGB 9.5*  HCT 31.5*  MCV 92.4  PLT 440*   Basic Metabolic Panel: Recent Labs  Lab 02/07/18 2029  NA 136  K 3.9  CL 96*  CO2 28  GLUCOSE 253*  BUN 22  CREATININE 1.21*  CALCIUM 8.9   GFR: Estimated Creatinine Clearance: 32.8 mL/min  (A) (by C-G formula based on SCr of 1.21 mg/dL (H)). Liver Function Tests: No results for input(s): AST, ALT, ALKPHOS, BILITOT, PROT, ALBUMIN in the last 168 hours. No results for input(s): LIPASE, AMYLASE in the last 168 hours. No results for input(s): AMMONIA in the last 168 hours. Coagulation Profile: No results for input(s): INR, PROTIME in the last 168 hours. Cardiac Enzymes: No results for input(s): CKTOTAL, CKMB, CKMBINDEX, TROPONINI in the last 168 hours. BNP (last 3 results) No results for input(s): PROBNP in the last 8760 hours. HbA1C: No results for input(s): HGBA1C in the last 72 hours. CBG: No results for input(s): GLUCAP in the last 168 hours. Lipid Profile: No results for input(s): CHOL, HDL, LDLCALC, TRIG, CHOLHDL, LDLDIRECT in the last 72 hours. Thyroid Function Tests: No results for input(s): TSH, T4TOTAL, FREET4, T3FREE, THYROIDAB in the last 72 hours. Anemia Panel: No results for input(s): VITAMINB12, FOLATE, FERRITIN, TIBC, IRON, RETICCTPCT in the last 72 hours. Urine analysis:    Component Value Date/Time   COLORURINE YELLOW 05/11/2017 0242   APPEARANCEUR CLEAR 05/11/2017 0242   LABSPEC 1.027 05/11/2017 0242   PHURINE 5.0 05/11/2017 0242   GLUCOSEU >=500 (A) 05/11/2017 0242   HGBUR NEGATIVE 05/11/2017 0242   BILIRUBINUR NEGATIVE 05/11/2017 0242   BILIRUBINUR moderate 09/12/2011 1359   KETONESUR 5 (A) 05/11/2017 0242   PROTEINUR NEGATIVE 05/11/2017 0242   UROBILINOGEN 0.2 09/12/2011 1359   UROBILINOGEN 1.0 02/20/2009 0846   NITRITE NEGATIVE 05/11/2017 0242   LEUKOCYTESUR NEGATIVE 05/11/2017 0242    Radiological Exams on Admission: Dg Chest 2 View  Result Date: 02/07/2018 CLINICAL DATA:  Chest pain EXAM: CHEST - 2 VIEW COMPARISON:  01/16/2018, 10/06/2017 FINDINGS: Left-sided pacing device. Post sternotomy changes. Tiny pleural effusions. Bilateral lower lobe right greater than left pulmonary infiltrates. Mild cardiomegaly. No pneumothorax. Vascular coils  in the left upper quadrant. IMPRESSION: 1. Tiny pleural effusions. Bibasilar right greater than left infiltrates. 2. Cardiomegaly Electronically Signed   By: Jasmine Pang M.D.   On: 02/07/2018 20:53   Dg Lumbar Spine Complete  Result Date: 02/07/2018 CLINICAL DATA:  Back pain with history of fall EXAM: LUMBAR SPINE - COMPLETE 4+ VIEW COMPARISON:  CT 01/30/2017 FINDINGS: Vascular coils in the left upper quadrant. 5 non rib-bearing lumbar type vertebra. Vertebral body heights are maintained. Moderate  degenerative change at L4-L5 with mild degenerative change at L1-L2 and L2-L3. aortic vascular calcification. IMPRESSION: No acute osseous abnormality. Degenerative changes of the spine most notable at L4-L5. Electronically Signed   By: Jasmine Pang M.D.   On: 02/07/2018 20:55    EKG: Independently reviewed.  Assessment/Plan Principal Problem:   CAP (community acquired pneumonia) Active Problems:   CAD- CABG X 4 10/08   Diabetes (HCC)   Essential hypertension   PAF (paroxysmal atrial fibrillation) (HCC)   Chronic systolic CHF (congestive heart failure) (HCC)    1. CAP - 1. PNA pathway 2. Rocephin / azithro 3. Repeat CBC/BMP in AM 2. Chronic systolic CHF - 1. Will "gently hydrate" patient by holding torsemide for the moment 2. Watch for fluid overload during stay 3. Continue Plavix 3. PAF - 1. Continue coreg 2. Continue amiodarone 3. Continue apixaban 4. Tele monitor for 24H 5. Currently NSR 4. HTN - 1. Continue Coreg 2. Holding torsemide and lisinopril 5. DM2 - 1. Lantus 4u QHS 2. Mod scale SSI AC  DVT prophylaxis: Apixaban Code Status: Full Family Communication: Family at bedside Disposition Plan: Home after admit Consults called: None Admission status: Admit to inpatient - IP status is felt appropriate due to elevated PSI/PORT score of 93 because of multiple comorbidities, thus putting patient in a high risk category for mortality with PNA   Hillary Bow DO [facility] Pager [phone] Only works nights!  If 7AM-7PM, please contact the primary day team physician taking care of patient  [email or web address] Password [email or web address]  02/07/2018, 10:53 PM

## 2018-02-07 NOTE — ED Provider Notes (Signed)
[facility] EMERGENCY DEPARTMENT Provider Note   CSN: [id number] Arrival date & time: 02/07/18  2012     History   Chief Complaint Chief Complaint  Patient presents with  . Chest Pain  Cough  HPI [name] is a 72 y.o. female.  Complaint of cough productive of yellow sputum for the past 2 weeks, becoming worse over the past 1 or 2 days.  She denies any nausea or vomiting.  She treated herself with Mucinex days ago without relief.  Other associated symptoms include generalized weakness and left-sided parathoracic back pain.  She had subjective fever yesterday.  She reports diffuse chest pain worse with coughing and improved with rest.  Pain is nonexertional no other associated symptoms  HPI  Past Medical History:  Diagnosis Date  . Cholelithiases   . Coronary artery disease 02/2007   CABG  . Diabetes (HCC)   . Dyslipidemia   . HTN (hypertension)   . Hypothyroid   . PAF (paroxysmal atrial fibrillation) (HCC)    post op  . S/P CABG x 4 2008    Patient Active Problem List   Diagnosis Date Noted  . Ischemic cardiomyopathy 10/05/2017  . Chronic systolic CHF (congestive heart failure) (HCC) 09/03/2017  . Orthostatic hypotension 09/03/2017  . Pleural effusion   . Status post coronary artery stent placement   . Status post thoracentesis   . NSTEMI (non-ST elevated myocardial infarction) (HCC)   . NSVT (nonsustained ventricular tachycardia) (HCC) 02/08/2017  . SOB (shortness of breath)   . Hypertensive heart disease without heart failure   . 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 Diabetes (HCC) 12/17/2012  . Essential hypertension 12/17/2012  . PAF (paroxysmal atrial fibrillation) (HCC) 12/17/2012  . Dyslipidemia 12/17/2012  . Hypothyroid 12/17/2012  . Depression 12/17/2012    Past Surgical History:  Procedure Laterality Date  . ABDOMINAL HYSTERECTOMY    . CARDIAC CATHETERIZATION  03/01/2007   LAD with 99% prox lesion, at birfucation of Cfx there is  99% lesion, at bifurcation of large OM1 there is 99% lesion, 70% prox lesion at ramus intermedius, 90% lesion in prox RCA (Dr. Claudia Desanctis) - susequent CABG  . CORONARY ARTERY BYPASS GRAFT  03/05/2007   LIMA-LAD, SVG-ramus intermediate. SVG-OM, SVG-PDA (Dr. Donata Clay)  . CORONARY ATHERECTOMY N/A 05/17/2017   Procedure: CORONARY ATHERECTOMY;  Surgeon: Lennette Bihari, MD;  Location: [facility] INVASIVE CV LAB;  Service: Cardiovascular;  Laterality: N/A;  . CORONARY STENT INTERVENTION N/A 05/17/2017   Procedure: CORONARY STENT INTERVENTION;  Surgeon: Lennette Bihari, MD;  Location: [facility] INVASIVE CV LAB;  Service: Cardiovascular;  Laterality: N/A;  . ICD IMPLANT N/A 10/05/2017   Procedure: ICD IMPLANT;  Surgeon: Duke Salvia, MD;  Location: [facility] INVASIVE CV LAB;  Service: Cardiovascular;  Laterality: N/A;  . INCONTINENCE SURGERY  2009  . IR ANGIOGRAM SELECTIVE EACH ADDITIONAL VESSEL  01/31/2017  . IR ANGIOGRAM SELECTIVE EACH ADDITIONAL VESSEL  01/31/2017  . IR ANGIOGRAM VISCERAL SELECTIVE  01/31/2017  . IR EMBO ART  VEN HEMORR LYMPH EXTRAV  INC GUIDE ROADMAPPING  01/31/2017  . IR THORACENTESIS ASP PLEURAL SPACE W/IMG GUIDE  05/11/2017  . IR THORACENTESIS ASP PLEURAL SPACE W/IMG GUIDE  05/18/2017  . IR US GUIDE VASC ACCESS RIGHT  01/31/2017  . LEFT HEART CATH AND CORS/GRAFTS ANGIOGRAPHY N/A 05/15/2017   Procedure: LEFT HEART CATH AND CORS/GRAFTS ANGIOGRAPHY;  Surgeon: Lennette Bihari, MD;  Location: [facility] INVASIVE CV LAB;  Service: Cardiovascular;  Laterality: N/A;  .  LEFT HEART CATH AND CORS/GRAFTS ANGIOGRAPHY N/A 05/17/2017   Procedure: LEFT HEART CATH AND CORS/GRAFTS ANGIOGRAPHY;  Surgeon: Lennette Bihari, MD;  Location: [facility] INVASIVE CV LAB;  Service: Cardiovascular;  Laterality: N/A;  . NM MYOCAR PERF WALL MOTION  07/2011   bruce myoview - mild perfusion defect in basal inferolateral & mid inferolateral regions (attenuation artifact, but prior infarct annote be excluded)' EF 49%; low risk  . TRANSTHORACIC ECHOCARDIOGRAM   07/2011   EF 45-50%, mild septal hypokinesis, mild inferior wall hypokinesis; RV systolc function mildly reduced; borderline LA enlargement; mild MR & TR; AV mildly sclerotic; mild pulm valve regurg      OB History   None      Home Medications    Prior to Admission medications   Medication Sig Start Date End Date Taking? Authorizing Provider  amiodarone (PACERONE) 200 MG tablet Take 1 tablet (200 mg total) by mouth daily. 01/16/18  Yes Laurey Morale, MD  apixaban (ELIQUIS) 5 MG TABS tablet Take 1 tablet (5 mg total) by mouth 2 (two) times daily. 01/16/18  Yes Clegg, Amy D, NP  carvedilol (COREG) 6.25 MG tablet Take 1 tablet (6.25 mg total) by mouth 2 (two) times daily with a meal. 01/16/18  Yes Laurey Morale, MD  cholecalciferol (VITAMIN D) 1000 UNITS tablet Take 1,000 Units by mouth 2 (two) times daily.    Yes [provider]  clopidogrel (PLAVIX) 75 MG tablet Take 1 tablet (75 mg total) by mouth daily with breakfast. 02/05/18  Yes Hilty, Lisette Abu, MD  Cyanocobalamin (VITAMIN B-12 PO) Take by mouth daily.   Yes [provider]  ezetimibe (ZETIA) 10 MG tablet Take 1 tablet (10 mg total) by mouth daily. 07/09/17 12/19/18 Yes Jodelle Gross, NP  feeding supplement, ENSURE ENLIVE, (ENSURE ENLIVE) LIQD Take 237 mLs by mouth 3 (three) times daily between meals. Patient taking differently: Take 237 mLs by mouth daily.  02/19/17  Yes Meuth, Brooke A, PA-C  insulin aspart (NOVOLOG) 100 UNIT/ML injection Inject 0-15 Units into the skin 3 (three) times daily with meals. 02/19/17  Yes Meuth, Brooke A, PA-C  insulin glargine (LANTUS) 100 UNIT/ML injection Inject 0.06 mLs (6 Units total) into the skin at bedtime. 05/20/17  Yes Barnetta Chapel, MD  levothyroxine (SYNTHROID, LEVOTHROID) 50 MCG tablet Take 1 tablet (50 mcg total) by mouth daily. 07/11/17  Yes Hilty, Lisette Abu, MD  losartan (COZAAR) 25 MG tablet Take 0.5 tablets (12.5 mg total) by mouth daily. 12/18/17  Yes Laurey Morale, MD  simvastatin (ZOCOR) 40 MG tablet Take 1 tablet (40 mg total) by mouth at bedtime. 07/11/17 12/19/18 Yes Hilty, Lisette Abu, MD  torsemide (DEMADEX) 20 MG tablet Take 1 tablet (20 mg total) by mouth daily. 09/03/17  Yes Graciella Freer, PA-C  venlafaxine XR (EFFEXOR-XR) 150 MG 24 hr capsule Take 1 capsule (150 mg total) by mouth daily. 07/12/17  Yes Hilty, Lisette Abu, MD  vitamin C (ASCORBIC ACID) 500 MG tablet Take 500 mg by mouth daily.   Yes [provider]  NONFORMULARY OR COMPOUNDED ITEM [facility]:  Onychomycosis Nail Lacquer - Fluconazole 2%, Terbinafine 1%, DMSO, apply daily to affected area. Patient not taking: Reported on 02/07/2018 02/15/16   Vivi Barrack, DPM    Family History Family History  Problem Relation Age of Onset  . Diabetes Mother 65  . Heart disease Mother   . Hyperlipidemia Mother   . Cancer Father   . Heart attack Father  70  . Cancer Maternal Grandmother   . Cancer Paternal Grandmother   . Thyroid disease Sister   . Diabetes Child     Social History Social History   Tobacco Use  . Smoking status: Never Smoker  . Smokeless tobacco: Never Used  Substance Use Topics  . Alcohol use: No  . Drug use: No     Allergies   Crestor [rosuvastatin] and Lipitor [atorvastatin]   Review of Systems Review of Systems  Constitutional: Negative.   HENT: Negative.   Respiratory: Positive for cough and shortness of breath.   Cardiovascular: Negative.   Gastrointestinal: Negative.   Musculoskeletal: Positive for back pain.  Skin: Negative.   Allergic/Immunologic: Positive for immunocompromised state.  Neurological: Negative.   Psychiatric/Behavioral: Negative.   All other systems reviewed and are negative.    Physical Exam Updated Vital Signs BP (!) 132/51 (BP Location: Right Arm)   Pulse 69   Temp 99.8 F (37.7 C) (Oral)   Resp 17   Ht 5\' 2"  (1.575 m)   Wt 59 kg   SpO2 95%   BMI 23.78 kg/m   Physical Exam    Constitutional:  Chronically ill-appearing  HENT:  Head: Normocephalic and atraumatic.  Eyes: Pupils are equal, round, and reactive to light. Conjunctivae are normal.  Neck: Neck supple. No tracheal deviation present. No thyromegaly present.  Cardiovascular: Normal rate and regular rhythm.  No murmur heard. Pulmonary/Chest: Effort normal. No respiratory distress. She exhibits no tenderness.  Diffuse rhonchi  Abdominal: Soft. Bowel sounds are normal. She exhibits no distension. There is no tenderness.  Musculoskeletal: Normal range of motion. She exhibits no edema or tenderness.  Neurological: She is alert. Coordination normal.  Skin: Skin is warm and dry. Capillary refill takes less than 2 seconds. No rash noted.  Psychiatric: She has a normal mood and affect.  Nursing note and vitals reviewed.    ED Treatments / Results  Labs (all labs ordered are listed, but only abnormal results are displayed) Labs Reviewed  CBC - Abnormal; Notable for the following components:      Result Value   WBC 15.4 (*)    RBC 3.41 (*)    Hemoglobin 9.5 (*)    HCT 31.5 (*)    Platelets 440 (*)    All other components within normal limits  BASIC METABOLIC PANEL  URINALYSIS, ROUTINE W REFLEX MICROSCOPIC  I-STAT TROPONIN, ED    EKG EKG Interpretation  Date/Time:  Thursday February 07 2018 20:22:08 EDT Ventricular Rate:  71 PR Interval:    QRS Duration: 128 QT Interval:  404 QTC Calculation: 439 R Axis:   -60 Text Interpretation:  Atrial flutter Left axis deviation Non-specific intra-ventricular conduction block Septal infarct , age undetermined Abnormal ECG No significant change since last tracing Confirmed by Doug Sou [phone]) on 02/07/2018 9:25:55 PM   Radiology Dg Chest 2 View  Result Date: 02/07/2018 CLINICAL DATA:  Chest pain EXAM: CHEST - 2 VIEW COMPARISON:  01/16/2018, 10/06/2017 FINDINGS: Left-sided pacing device. Post sternotomy changes. Tiny pleural effusions. Bilateral lower  lobe right greater than left pulmonary infiltrates. Mild cardiomegaly. No pneumothorax. Vascular coils in the left upper quadrant. IMPRESSION: 1. Tiny pleural effusions. Bibasilar right greater than left infiltrates. 2. Cardiomegaly Electronically Signed   By: Jasmine Pang M.D.   On: 02/07/2018 20:53   Dg Lumbar Spine Complete  Result Date: 02/07/2018 CLINICAL DATA:  Back pain with history of fall EXAM: LUMBAR SPINE - COMPLETE 4+ VIEW COMPARISON:  CT 01/30/2017 FINDINGS:  Vascular coils in the left upper quadrant. 5 non rib-bearing lumbar type vertebra. Vertebral body heights are maintained. Moderate degenerative change at L4-L5 with mild degenerative change at L1-L2 and L2-L3. aortic vascular calcification. IMPRESSION: No acute osseous abnormality. Degenerative changes of the spine most notable at L4-L5. Electronically Signed   By: Jasmine Pang M.D.   On: 02/07/2018 20:55    Procedures Procedures (including critical care time)  Medications Ordered in ED Medications  acetaminophen (TYLENOL) tablet 650 mg (650 mg Oral Given 02/07/18 2028)     Initial Impression / Assessment and Plan / ED Course  I have reviewed the triage vital signs and the nursing notes.  Pertinent labs & imaging results that were available during my care of the patient were reviewed by me and considered in my medical decision making (see chart for details).     Lab work consistent with anemia which is chronic and leukocytosis as well as hyperglycemia Plan treatment with intravenous antibiotics as patient immune compromised, elderly,  Sepsis called by me based on sirs criteria of leukocytosis, fever of infection community acquired pneumonia  Sepsis - Repeat Assessment  Performed at:    1025pm  Vitals     Blood pressure 132/69, pulse 66, temperature 99.8 F (37.7 C), temperature source Oral, resp. rate (!) 22, height 5\' 2"  (1.575 m), weight 59 kg, SpO2 93 %.  Heart:     Regular rate and  rhythm  Lungs:    Rhonchi  Capillary Refill:   <2 sec  Peripheral Pulse:   Radial pulse palpable  Skin:     Normal Color   Results for orders placed or performed during the hospital encounter of 02/07/18  Basic metabolic panel  Result Value Ref Range   Sodium 136 135 - 145 mmol/L   Potassium 3.9 3.5 - 5.1 mmol/L   Chloride 96 (L) 98 - 111 mmol/L   CO2 28 22 - 32 mmol/L   Glucose, Bld 253 (H) 70 - 99 mg/dL   BUN 22 8 - 23 mg/dL   Creatinine, Ser 9.60 (H) 0.44 - 1.00 mg/dL   Calcium 8.9 8.9 - 45.4 mg/dL   GFR calc non Af Amer 43 (L) >60 mL/min   GFR calc Af Amer 50 (L) >60 mL/min   Anion gap 12 5 - 15  CBC  Result Value Ref Range   WBC 15.4 (H) 4.0 - 10.5 K/uL   RBC 3.41 (L) 3.87 - 5.11 MIL/uL   Hemoglobin 9.5 (L) 12.0 - 15.0 g/dL   HCT 09.8 (L) 11.9 - 14.7 %   MCV 92.4 78.0 - 100.0 fL   MCH 27.9 26.0 - 34.0 pg   MCHC 30.2 30.0 - 36.0 g/dL   RDW 82.9 56.2 - 13.0 %   Platelets 440 (H) 150 - 400 K/uL  I-stat troponin, ED  Result Value Ref Range   Troponin i, poc 0.00 0.00 - 0.08 ng/mL   Comment 3          I-Stat CG4 Lactic Acid, ED  Result Value Ref Range   Lactic Acid, Venous 1.60 0.5 - 1.9 mmol/L   Dg Chest 2 View  Result Date: 02/07/2018 CLINICAL DATA:  Chest pain EXAM: CHEST - 2 VIEW COMPARISON:  01/16/2018, 10/06/2017 FINDINGS: Left-sided pacing device. Post sternotomy changes. Tiny pleural effusions. Bilateral lower lobe right greater than left pulmonary infiltrates. Mild cardiomegaly. No pneumothorax. Vascular coils in the left upper quadrant. IMPRESSION: 1. Tiny pleural effusions. Bibasilar right greater than left infiltrates. 2. Cardiomegaly Electronically  Signed   By: Jasmine Pang M.D.   On: 02/07/2018 20:53   Dg Chest 2 View  Result Date: 01/16/2018 CLINICAL DATA:  Chronic systolic congestive heart failure. Recurrent atrial fibrillation. EXAM: CHEST - 2 VIEW COMPARISON:  10/06/2017 FINDINGS: AICD in place, unchanged. Heart size is normal. Right-sided aortic  arch. Lungs are clear. No effusions. Coronary artery stents in place. CABG. Incidental note made of vascular coils in the left upper quadrant of the abdomen. IMPRESSION: No active cardiopulmonary disease. No change in the position of the AICD. Electronically Signed   By: Francene Boyers M.D.   On: 01/16/2018 17:17   Dg Lumbar Spine Complete  Result Date: 02/07/2018 CLINICAL DATA:  Back pain with history of fall EXAM: LUMBAR SPINE - COMPLETE 4+ VIEW COMPARISON:  CT 01/30/2017 FINDINGS: Vascular coils in the left upper quadrant. 5 non rib-bearing lumbar type vertebra. Vertebral body heights are maintained. Moderate degenerative change at L4-L5 with mild degenerative change at L1-L2 and L2-L3. aortic vascular calcification. IMPRESSION: No acute osseous abnormality. Degenerative changes of the spine most notable at L4-L5. Electronically Signed   By: Jasmine Pang M.D.   On: 02/07/2018 20:55   I consulted Dr. Julian Reil who arranged for admission. Final Clinical Impressions(s) / ED Diagnoses  Diagnoses #1 sepsis #2 community-acquired pneumonia #3 anemia Final diagnoses:  None   #4 hyperglycemia ED Discharge Orders    None    CRITICAL CARE Performed by: Doug Sou Total critical care time: 30 minutes Critical care time was exclusive of separately billable procedures and treating other patients. Critical care was necessary to treat or prevent imminent or life-threatening deterioration. Critical care was time spent personally by me on the following activities: development of treatment plan with patient and/or surrogate as well as nursing, discussions with consultants, evaluation of patient's response to treatment, examination of patient, obtaining history from patient or surrogate, ordering and performing treatments and interventions, ordering and review of laboratory studies, ordering and review of radiographic studies, pulse oximetry and re-evaluation of patient's condition.   Doug Sou,  MD 02/07/18 2337

## 2018-02-07 NOTE — ED Notes (Signed)
Unsuccessful iv X 2, having another nurse try

## 2018-02-08 ENCOUNTER — Encounter (HOSPITAL_COMMUNITY): Payer: Self-pay | Admitting: *Deleted

## 2018-02-08 ENCOUNTER — Other Ambulatory Visit: Payer: Self-pay

## 2018-02-08 DIAGNOSIS — E1159 Type 2 diabetes mellitus with other circulatory complications: Secondary | ICD-10-CM

## 2018-02-08 DIAGNOSIS — I25810 Atherosclerosis of coronary artery bypass graft(s) without angina pectoris: Secondary | ICD-10-CM

## 2018-02-08 LAB — GLUCOSE, CAPILLARY
GLUCOSE-CAPILLARY: 224 mg/dL — AB (ref 70–99)
GLUCOSE-CAPILLARY: 252 mg/dL — AB (ref 70–99)
Glucose-Capillary: 150 mg/dL — ABNORMAL HIGH (ref 70–99)
Glucose-Capillary: 193 mg/dL — ABNORMAL HIGH (ref 70–99)
Glucose-Capillary: 202 mg/dL — ABNORMAL HIGH (ref 70–99)

## 2018-02-08 LAB — BASIC METABOLIC PANEL
ANION GAP: 8 (ref 5–15)
BUN: 19 mg/dL (ref 8–23)
CO2: 31 mmol/L (ref 22–32)
Calcium: 8.7 mg/dL — ABNORMAL LOW (ref 8.9–10.3)
Chloride: 101 mmol/L (ref 98–111)
Creatinine, Ser: 1.12 mg/dL — ABNORMAL HIGH (ref 0.44–1.00)
GFR calc Af Amer: 55 mL/min — ABNORMAL LOW (ref >60)
GFR, EST NON AFRICAN AMERICAN: 48 mL/min — AB (ref >60)
Glucose, Bld: 164 mg/dL — ABNORMAL HIGH (ref 70–99)
POTASSIUM: 3.5 mmol/L (ref 3.5–5.1)
SODIUM: 140 mmol/L (ref 135–145)

## 2018-02-08 LAB — URINALYSIS, ROUTINE W REFLEX MICROSCOPIC
BILIRUBIN URINE: NEGATIVE
GLUCOSE, UA: NEGATIVE mg/dL
HGB URINE DIPSTICK: NEGATIVE
KETONES UR: NEGATIVE mg/dL
Leukocytes, UA: NEGATIVE
NITRITE: POSITIVE — AB
PH: 5 (ref 5.0–8.0)
Protein, ur: NEGATIVE mg/dL
Specific Gravity, Urine: 1.006 (ref 1.005–1.030)

## 2018-02-08 LAB — CBC
HCT: 29 % — ABNORMAL LOW (ref 36.0–46.0)
HEMOGLOBIN: 9 g/dL — AB (ref 12.0–15.0)
MCH: 28.2 pg (ref 26.0–34.0)
MCHC: 31 g/dL (ref 30.0–36.0)
MCV: 90.9 fL (ref 78.0–100.0)
PLATELETS: 398 10*3/uL (ref 150–400)
RBC: 3.19 MIL/uL — AB (ref 3.87–5.11)
RDW: 13.1 % (ref 11.5–15.5)
WBC: 13.4 10*3/uL — ABNORMAL HIGH (ref 4.0–10.5)

## 2018-02-08 LAB — STREP PNEUMONIAE URINARY ANTIGEN: Strep Pneumo Urinary Antigen: NEGATIVE

## 2018-02-08 LAB — HIV ANTIBODY (ROUTINE TESTING W REFLEX): HIV SCREEN 4TH GENERATION: NONREACTIVE

## 2018-02-08 MED ORDER — ACETAMINOPHEN 325 MG PO TABS: 650.0000 mg | ORAL_TABLET | Freq: Four times a day (QID) | ORAL | Status: DC | PRN

## 2018-02-08 MED ORDER — INSULIN ASPART 100 UNIT/ML ~~LOC~~ SOLN
0.0000 [IU] | Freq: Every day | SUBCUTANEOUS | Status: DC
  Administered 2018-02-08 – 2018-02-09 (×2): 2 [IU] via SUBCUTANEOUS

## 2018-02-08 MED ORDER — INSULIN GLARGINE 100 UNIT/ML ~~LOC~~ SOLN
6.0000 [IU] | Freq: Every day | SUBCUTANEOUS | Status: DC
  Administered 2018-02-08 – 2018-02-09 (×2): 6 [IU] via SUBCUTANEOUS
  Filled 2018-02-08 (×2): qty 0.06

## 2018-02-08 MED ORDER — INSULIN ASPART 100 UNIT/ML ~~LOC~~ SOLN
0.0000 [IU] | Freq: Three times a day (TID) | SUBCUTANEOUS | Status: DC
  Administered 2018-02-08: 2 [IU] via SUBCUTANEOUS
  Administered 2018-02-09: 1 [IU] via SUBCUTANEOUS
  Administered 2018-02-09 (×2): 3 [IU] via SUBCUTANEOUS
  Administered 2018-02-10: 5 [IU] via SUBCUTANEOUS
  Administered 2018-02-10: 2 [IU] via SUBCUTANEOUS

## 2018-02-08 NOTE — Evaluation (Signed)
Physical Therapy Evaluation Patient Details Name: [name] MRN: [id number] DOB: [birth year] Today's Date: 02/08/2018   History of Present Illness  Pt is a 72 y/o female admitted secondary to increased SOB. Found to have CAP. PMH includes CAD s/p CABG, HTN, DM, a fib, CHF, and s/p ICD.   Clinical Impression  Pt admitted secondary to problem above with deficits below. Pt unsteady when standing without AD, so used RW for mobility throughout session; pt requiring min guard A with use of RW. Pt with improved steadiness noted with use of RW, and educated about use of RW at home to increase safety. Pt primary caregiver for husband at home, but reports kids can check on her intermittently. Will continue to follow acutely to maximize functional mobility independence and safety.     Follow Up Recommendations Home health PT;Supervision for mobility/OOB    Equipment Recommendations  None recommended by PT    Recommendations for Other Services       Precautions / Restrictions Precautions Precautions: Fall Precaution Comments: Pt reports multiple falls at home  Restrictions Weight Bearing Restrictions: No      Mobility  Bed Mobility Overal bed mobility: Needs Assistance Bed Mobility: Supine to Sit     Supine to sit: Supervision     General bed mobility comments: Supervision for safety.   Transfers Overall transfer level: Needs assistance Equipment used: None;Rolling walker (2 wheeled) Transfers: Sit to/from Stand Sit to Stand: Min assist;Min guard         General transfer comment: Min A for lift assist and steadying to stand. Pt unsteady without AD, so had pt return to sitting and used RW for next attempt. Pt with improved steadiness and required min guard for safety.   Ambulation/Gait Ambulation/Gait assistance: Min guard Gait Distance (Feet): 125 Feet Assistive device: Rolling walker (2 wheeled) Gait Pattern/deviations: Step-through pattern;Decreased stride length Gait  velocity: Decreased    General Gait Details: Slow, cautious gait. Mild unsteadiness noted, however, no overt LOB noted. Educated about use of RW at home to increase stability.   Stairs            Wheelchair Mobility    Modified Rankin (Stroke Patients Only)       Balance Overall balance assessment: Needs assistance Sitting-balance support: No upper extremity supported;Feet supported Sitting balance-Leahy Scale: Good     Standing balance support: Bilateral upper extremity supported;During functional activity Standing balance-Leahy Scale: Poor Standing balance comment: Reliant on BUE support                              Pertinent Vitals/Pain Pain Assessment: No/denies pain    Home Living Family/patient expects to be discharged to:: Private residence Living Arrangements: Spouse/significant other Available Help at Discharge: Family Type of Home: House Home Access: Stairs to enter Entrance Stairs-Rails: Can reach both Entrance Stairs-Number of Steps: 3 Home Layout: One level Home Equipment: Environmental consultant - 2 wheels;Cane - single point;Tub bench Additional Comments: Pt provides supervision for spouse who is cognitively impaired.     Prior Function Level of Independence: Independent with assistive device(s)         Comments: Reports she uses cane outside of house, but uses furniture inside of house for ambulation.     Hand Dominance        Extremity/Trunk Assessment   Upper Extremity Assessment Upper Extremity Assessment: Overall WFL for tasks assessed    Lower Extremity Assessment Lower Extremity Assessment: Generalized weakness  Cervical / Trunk Assessment Cervical / Trunk Assessment: Kyphotic  Communication   Communication: No difficulties  Cognition Arousal/Alertness: Awake/alert Behavior During Therapy: WFL for tasks assessed/performed Overall Cognitive Status: Within Functional Limits for tasks assessed                                         General Comments General comments (skin integrity, edema, etc.): Educated to allow family to go down steps to basement and assist with laundry and educated about recommendations for HHPT.     Exercises     Assessment/Plan    PT Assessment Patient needs continued PT services  PT Problem List Decreased strength;Decreased balance;Decreased knowledge of use of DME;Decreased knowledge of precautions;Decreased mobility       PT Treatment Interventions DME instruction;Gait training;Stair training;Functional mobility training;Therapeutic activities;Therapeutic exercise;Balance training;Patient/family education    PT Goals (Current goals can be found in the Care Plan section)  Acute Rehab PT Goals Patient Stated Goal: "to go home and see my husband"  PT Goal Formulation: With patient Time For Goal Achievement: 02/22/18 Potential to Achieve Goals: Good    Frequency Min 3X/week   Barriers to discharge        Co-evaluation               AM-PAC PT "6 Clicks" Daily Activity  Outcome Measure Difficulty turning over in bed (including adjusting bedclothes, sheets and blankets)?: A Little Difficulty moving from lying on back to sitting on the side of the bed? : A Little Difficulty sitting down on and standing up from a chair with arms (e.g., wheelchair, bedside commode, etc,.)?: Unable Help needed moving to and from a bed to chair (including a wheelchair)?: A Little Help needed walking in hospital room?: A Little Help needed climbing 3-5 steps with a railing? : A Lot 6 Click Score: 15    End of Session Equipment Utilized During Treatment: Gait belt Activity Tolerance: Patient tolerated treatment well Patient left: in chair;with call bell/phone within reach;with chair alarm set Nurse Communication: Mobility status PT Visit Diagnosis: Unsteadiness on feet (R26.81);Repeated falls (R29.6);Muscle weakness (generalized) (M62.81);History of falling (Z91.81)     Time: 1610-9604 PT Time Calculation (min) (ACUTE ONLY): 19 min   Charges:   PT Evaluation $PT Eval Low Complexity: 1 Low          Gladys Damme, PT, DPT  Acute Rehabilitation Services  Pager: [phone] Office: [phone]   Lehman Prom 02/08/2018, 1:32 PM

## 2018-02-08 NOTE — Progress Notes (Signed)
PROGRESS NOTE   [name]  WUJ:[id number]    DOB: Sep 08, 1945    DOA: 02/07/2018  PCP: Dorisann Frames, MD   I have briefly reviewed patients previous medical records in [facility].  Brief Narrative:  72 year old married female, has a cane and walker at home but uses cane only when she goes out of her home, PMH of CAD, CABG, ICM (LVEF 25-30% by TTE 09/03/2017), AICD, chronic systolic CHF, postop PAF on Eliquis, DM 2, HTN, HLD, hypothyroid, exposed to her neighbor with URI symptoms, presented to ED 02/07/2018 due to 2 weeks history of cough productive of yellow sputum, dyspnea and congestion which significantly worsened 1 to 2 days PTA with fever and generalized weakness.  In ED febrile 102.8 F.  Did not meet sepsis criteria on admission.  Chest x-ray showed bibasilar right greater than left infiltrates.  Admitted for community-acquired pneumonia.  Improving.   Assessment & Plan:   Principal Problem:   CAP (community acquired pneumonia) Active Problems:   CAD- CABG X 4 10/08   Diabetes (HCC)   Essential hypertension   PAF (paroxysmal atrial fibrillation) (HCC)   Chronic systolic CHF (congestive heart failure) (HCC)   Community-acquired pneumonia: Blood cultures x2: Negative to date.  Chest x-ray showed bibasilar right greater than left infiltrate.  Flu panel PCR negative.  Lactate normal.  Urine pneumococcal antigen negative.  Reports taking flu shot and pneumonia shot last year.  Empirically started on IV ceftriaxone and azithromycin, continue.  Improving.  Will need follow-up chest x-ray in 4 weeks to insure resolution of pneumonia findings.  ICM/chronic systolic CHF: Clinically was on the dry side on admission.  Diuretics temporarily held.  Brief and gentle IV fluids and then encourage oral fluids.  Reassess in a.m. and consider resuming diuretics when appropriate.  PAF: Continue amiodarone, carvedilol, apixaban.  Currently in sinus rhythm.  Essential hypertension: Controlled.   Continue carvedilol.  Temporarily holding torsemide and lisinopril.  Uncontrolled type II DM with hyperglycemia: Currently on Lantus 4 units at bedtime and NovoLog SSI.  Continue and adjust insulins as needed.  Follows with Dr. Talmage Nap, Endocrinology.  A1c 05/11/2017: 8.8, will repeat.  Hyperlipidemia: Continue statins and Zetia.  CAD status post CABG/AICD: No anginal symptoms.  Continue Plavix, statins and beta-blockers.  Unsteady gait and frequent falls: Patient reports multiple falls at home fortunately without any major injuries, last fall 3 weeks PTA.  PT evaluation.  Normocytic anemia: Unclear etiology.  Stable.  Outpatient follow-up.  Hypothyroid: Clinically euthyroid.  TSH 01/16/2018: 6.03.  Consider repeating TSH in 4 weeks and if elevated may need dose adjustment.  Stage III chronic kidney disease: Creatinine at baseline and stable.   DVT prophylaxis: On apixaban. Code Status: Full Family Communication: None at bedside Disposition: DC home pending clinical improvement and PT evaluation   Consultants:  None  Procedures:  None  Antimicrobials:  IV ceftriaxone and azithromycin   Subjective: Feels better.  Fevers resolved.  Cough improving but still productive.  Nasal congestion.  No chest pain or dyspnea.  States that she has a cane and walker at home but does not use it when she is at home because her house is small and when she is unsteady, able to hold onto furniture at home but still has frequent falls and the last one approximately 3 weeks ago.  ROS: As above.  Objective:  Vitals:   02/08/18 0019 02/08/18 0424 02/08/18 0944 02/08/18 1120  BP: (!) 122/47 (!) 125/58 (!) 123/50 117/63  Pulse:  63 (!) 58 68 66  Resp: 18 18 18 18   Temp: 99.2 F (37.3 C) 98.6 F (37 C) 99.6 F (37.6 C) 99.4 F (37.4 C)  TempSrc: Oral Oral Oral Oral  SpO2: 93% 95% 94% 92%  Weight: 58.6 kg     Height: 5\' 2"  (1.575 m)       Examination:  General exam: Pleasant elderly female,  moderately built and nourished, sitting up comfortably in bed.  Does not appear septic or toxic. Respiratory system: Few bibasilar crackles.  Rest of lung fields clear to auscultation without wheezing or rhonchi. Respiratory effort normal. Cardiovascular system: S1 & S2 heard, RRR. No JVD, murmurs, rubs, gallops or clicks. No pedal edema. Gastrointestinal system: Abdomen is nondistended, soft and nontender. No organomegaly or masses felt. Normal bowel sounds heard. Central nervous system: Alert and oriented. No focal neurological deficits. Extremities: Symmetric 5 x 5 power. Skin: No rashes, lesions or ulcers Psychiatry: Judgement and insight appear normal. Mood & affect appropriate.     Data Reviewed: I have personally reviewed following labs and imaging studies  CBC: Recent Labs  Lab 02/07/18 2029 02/08/18 0559  WBC 15.4* 13.4*  HGB 9.5* 9.0*  HCT 31.5* 29.0*  MCV 92.4 90.9  PLT 440* 398   Basic Metabolic Panel: Recent Labs  Lab 02/07/18 2029 02/08/18 0559  NA 136 140  K 3.9 3.5  CL 96* 101  CO2 28 31  GLUCOSE 253* 164*  BUN 22 19  CREATININE 1.21* 1.12*  CALCIUM 8.9 8.7*   HbA1C: No results for input(s): HGBA1C in the last 72 hours. CBG: Recent Labs  Lab 02/08/18 0024 02/08/18 0753 02/08/18 1122  GLUCAP 224* 150* 252*    Recent Results (from the past 240 hour(s))  Blood culture (routine x 2)     Status: None (Preliminary result)   Collection Time: 02/07/18 10:06 PM  Result Value Ref Range Status   Specimen Description BLOOD LEFT ANTECUBITAL  Final   Special Requests   Final    BOTTLES DRAWN AEROBIC AND ANAEROBIC Blood Culture results may not be optimal due to an inadequate volume of blood received in culture bottles   Culture   Final    NO GROWTH < 12 HOURS Performed at [facility], [address], [place], [place] [address]    Report Status PENDING  Incomplete  Blood culture (routine x 2)     Status: None (Preliminary result)   Collection Time:  02/07/18 10:10 PM  Result Value Ref Range Status   Specimen Description BLOOD LEFT ANTECUBITAL  Final   Special Requests   Final    BOTTLES DRAWN AEROBIC AND ANAEROBIC Blood Culture results may not be optimal due to an inadequate volume of blood received in culture bottles   Culture   Final    NO GROWTH < 12 HOURS Performed at [facility], [address] [place], [place] [address]    Report Status PENDING  Incomplete         Radiology Studies: Dg Chest 2 View  Result Date: 02/07/2018 CLINICAL DATA:  Chest pain EXAM: CHEST - 2 VIEW COMPARISON:  01/16/2018, 10/06/2017 FINDINGS: Left-sided pacing device. Post sternotomy changes. Tiny pleural effusions. Bilateral lower lobe right greater than left pulmonary infiltrates. Mild cardiomegaly. No pneumothorax. Vascular coils in the left upper quadrant. IMPRESSION: 1. Tiny pleural effusions. Bibasilar right greater than left infiltrates. 2. Cardiomegaly Electronically Signed   By: Jasmine Pang M.D.   On: 02/07/2018 20:53   Dg  Lumbar Spine Complete  Result Date: 02/07/2018 CLINICAL DATA:  Back pain with history of fall EXAM: LUMBAR SPINE - COMPLETE 4+ VIEW COMPARISON:  CT 01/30/2017 FINDINGS: Vascular coils in the left upper quadrant. 5 non rib-bearing lumbar type vertebra. Vertebral body heights are maintained. Moderate degenerative change at L4-L5 with mild degenerative change at L1-L2 and L2-L3. aortic vascular calcification. IMPRESSION: No acute osseous abnormality. Degenerative changes of the spine most notable at L4-L5. Electronically Signed   By: Jasmine Pang M.D.   On: 02/07/2018 20:55        Scheduled Meds: . amiodarone  200 mg Oral Daily  . apixaban  5 mg Oral BID  . azithromycin  500 mg Oral Q24H  . carvedilol  6.25 mg Oral BID WC  . cholecalciferol  1,000 Units Oral BID  . clopidogrel  75 mg Oral Q breakfast  . ezetimibe  10 mg Oral Daily  . feeding supplement (ENSURE ENLIVE)  237 mL Oral Daily  . insulin aspart   0-15 Units Subcutaneous TID WC  . insulin glargine  4 Units Subcutaneous QHS  . levothyroxine  50 mcg Oral QAC breakfast  . simvastatin  40 mg Oral QHS  . venlafaxine XR  150 mg Oral Daily   Continuous Infusions: . cefTRIAXone (ROCEPHIN)  IV Stopped (02/07/18 2308)     LOS: 1 day     Marcellus Scott, MD, FACP, [facility]. [facility] Pager [phone]  If 7PM-7AM, please contact night-coverage [email or web address] Password [email or web address] 02/08/2018, 12:11 PM

## 2018-02-09 ENCOUNTER — Encounter (HOSPITAL_COMMUNITY): Payer: Self-pay

## 2018-02-09 DIAGNOSIS — J9601 Acute respiratory failure with hypoxia: Secondary | ICD-10-CM

## 2018-02-09 LAB — GLUCOSE, CAPILLARY
GLUCOSE-CAPILLARY: 206 mg/dL — AB (ref 70–99)
Glucose-Capillary: 148 mg/dL — ABNORMAL HIGH (ref 70–99)
Glucose-Capillary: 202 mg/dL — ABNORMAL HIGH (ref 70–99)
Glucose-Capillary: 202 mg/dL — ABNORMAL HIGH (ref 70–99)

## 2018-02-09 LAB — CBC
HEMATOCRIT: 28.8 % — AB (ref 36.0–46.0)
HEMOGLOBIN: 8.8 g/dL — AB (ref 12.0–15.0)
MCH: 27.9 pg (ref 26.0–34.0)
MCHC: 30.6 g/dL (ref 30.0–36.0)
MCV: 91.4 fL (ref 78.0–100.0)
Platelets: 402 10*3/uL — ABNORMAL HIGH (ref 150–400)
RBC: 3.15 MIL/uL — AB (ref 3.87–5.11)
RDW: 13.2 % (ref 11.5–15.5)
WBC: 15.4 10*3/uL — AB (ref 4.0–10.5)

## 2018-02-09 MED ORDER — INFLUENZA VAC SPLIT HIGH-DOSE 0.5 ML IM SUSY
0.5000 mL | PREFILLED_SYRINGE | INTRAMUSCULAR | Status: AC
  Administered 2018-02-10: 0.5 mL via INTRAMUSCULAR
  Filled 2018-02-09: qty 0.5

## 2018-02-09 NOTE — Progress Notes (Signed)
PROGRESS NOTE   [name]  ZOX:[id number]    DOB: 11/29/1945    DOA: 02/07/2018  PCP: Dorisann Frames, MD   I have briefly reviewed patients previous medical records in [facility].  Brief Narrative:  72 year old married female, has a cane and walker at home but uses cane only when she goes out of her home, PMH of CAD, CABG, ICM (LVEF 25-30% by TTE 09/03/2017), AICD, chronic systolic CHF, postop PAF on Eliquis, DM 2, HTN, HLD, hypothyroid, exposed to her neighbor with URI symptoms, presented to ED 02/07/2018 due to 2 weeks history of cough productive of yellow sputum, dyspnea and congestion which significantly worsened 1 to 2 days PTA with fever and generalized weakness.  In ED febrile 102.8 F.  Did not meet sepsis criteria on admission.  Chest x-ray showed bibasilar right greater than left infiltrates.  Admitted for community-acquired pneumonia.  Improving.   Assessment & Plan:   Principal Problem:   CAP (community acquired pneumonia) Active Problems:   CAD- CABG X 4 10/08   Diabetes (HCC)   Essential hypertension   PAF (paroxysmal atrial fibrillation) (HCC)   Chronic systolic CHF (congestive heart failure) (HCC)   Community-acquired pneumonia: Blood cultures x2: Negative to date.  Chest x-ray showed bibasilar right greater than left infiltrate.  Flu panel PCR negative.  Lactate normal.  Urine pneumococcal antigen negative.  Reports taking flu shot and pneumonia shot last year.  Empirically started on IV ceftriaxone and azithromycin, continue.  Improving.  Will need follow-up chest x-ray in 4 weeks to insure resolution of pneumonia findings.  Continue additional 24 hours of IV antibiotics and potential discharge in a.m.  Acute respiratory failure with hypoxia: Likely related to pneumonia and chronic CHF.  Evaluate oxygen requirements prior to discharge.  ICM/chronic systolic CHF: Clinically was on the dry side on admission.  Diuretics temporarily held.  Brief and gentle IV fluids  and then encourage oral fluids.  Reassess in a.m. and consider resuming diuretics when appropriate.  Volume status looks okay.  Resume diuretics at discharge.  PAF: Continue amiodarone, carvedilol, apixaban.  Currently in sinus rhythm.  Stable.  Essential hypertension: Controlled.  Continue carvedilol.  Temporarily holding torsemide and lisinopril.  Uncontrolled type II DM with hyperglycemia: Currently on Lantus 6 units at bedtime and NovoLog SSI.  Continue and adjust insulins as needed.  Follows with Dr. Talmage Nap, Endocrinology.  A1c 05/11/2017: 8.8, will repeat.  Mildly uncontrolled and fluctuating.  Hyperlipidemia: Continue statins and Zetia.  CAD status post CABG/AICD: No anginal symptoms.  Continue Plavix, statins and beta-blockers.  Unsteady gait and frequent falls: Patient reports multiple falls at home fortunately without any major injuries, last fall 3 weeks PTA.  PT evaluated and recommend home health at discharge.  Normocytic anemia: Unclear etiology.  Stable.  Outpatient follow-up.  Hypothyroid: Clinically euthyroid.  TSH 01/16/2018: 6.03.  Consider repeating TSH in 4 weeks and if elevated may need dose adjustment.  Stage III chronic kidney disease: Creatinine at baseline and stable.   DVT prophylaxis: On apixaban. Code Status: Full Family Communication: None at bedside Disposition: DC home pending clinical improvement, possibly 10/6.   Consultants:  None  Procedures:  None  Antimicrobials:  IV ceftriaxone and azithromycin   Subjective: Continues to feel better.  Dyspnea improved but breathing not yet at baseline.  Still has mild intermittent cough with green sputum.  No chest pain.  No palpitations, dizziness or lightheadedness.  Feels stronger.  ROS: As above.  Objective:  Vitals:  02/09/18 0629 02/09/18 0631 02/09/18 0906 02/09/18 0954  BP: (!) 125/48  (!) 144/55 137/75  Pulse: 69  64 66  Resp:    18  Temp: 98.2 F (36.8 C)   98.8 F (37.1 C)  TempSrc:  Oral   Oral  SpO2: (!) 88%   90%  Weight:  58.9 kg    Height:        Examination:  General exam: Pleasant elderly female, moderately built and nourished, sitting up comfortably in bed.  Does not appear in any distress. Respiratory system: Occasional basal crackles.  Rest of lung fields clear to auscultation without wheezing or rhonchi. Respiratory effort normal. Cardiovascular system: S1 & S2 heard, RRR. No JVD, murmurs, rubs, gallops or clicks. No pedal edema.  Telemetry personally reviewed: SB in the 50s-SR. Gastrointestinal system: Abdomen is nondistended, soft and nontender. No organomegaly or masses felt. Normal bowel sounds heard.  Stable. Central nervous system: Alert and oriented. No focal neurological deficits.  Stable. Extremities: Symmetric 5 x 5 power. Skin: No rashes, lesions or ulcers Psychiatry: Judgement and insight appear normal. Mood & affect appropriate.     Data Reviewed: I have personally reviewed following labs and imaging studies  CBC: Recent Labs  Lab 02/07/18 2029 02/08/18 0559 02/09/18 0521  WBC 15.4* 13.4* 15.4*  HGB 9.5* 9.0* 8.8*  HCT 31.5* 29.0* 28.8*  MCV 92.4 90.9 91.4  PLT 440* 398 402*   Basic Metabolic Panel: Recent Labs  Lab 02/07/18 2029 02/08/18 0559  NA 136 140  K 3.9 3.5  CL 96* 101  CO2 28 31  GLUCOSE 253* 164*  BUN 22 19  CREATININE 1.21* 1.12*  CALCIUM 8.9 8.7*   HbA1C: No results for input(s): HGBA1C in the last 72 hours. CBG: Recent Labs  Lab 02/08/18 1122 02/08/18 1642 02/08/18 2256 02/09/18 0740 02/09/18 1202  GLUCAP 252* 193* 202* 148* 202*    Recent Results (from the past 240 hour(s))  Blood culture (routine x 2)     Status: None (Preliminary result)   Collection Time: 02/07/18 10:06 PM  Result Value Ref Range Status   Specimen Description BLOOD LEFT ANTECUBITAL  Final   Special Requests   Final    BOTTLES DRAWN AEROBIC AND ANAEROBIC Blood Culture results may not be optimal due to an inadequate volume  of blood received in culture bottles   Culture   Final    NO GROWTH < 24 HOURS Performed at [facility], [address] [place], [place] [address]    Report Status PENDING  Incomplete  Blood culture (routine x 2)     Status: None (Preliminary result)   Collection Time: 02/07/18 10:10 PM  Result Value Ref Range Status   Specimen Description BLOOD LEFT ANTECUBITAL  Final   Special Requests   Final    BOTTLES DRAWN AEROBIC AND ANAEROBIC Blood Culture results may not be optimal due to an inadequate volume of blood received in culture bottles   Culture   Final    NO GROWTH < 24 HOURS Performed at [facility], [address], [place], [place] [address]    Report Status PENDING  Incomplete         Radiology Studies: Dg Chest 2 View  Result Date: 02/07/2018 CLINICAL DATA:  Chest pain EXAM: CHEST - 2 VIEW COMPARISON:  01/16/2018, 10/06/2017 FINDINGS: Left-sided pacing device. Post sternotomy changes. Tiny pleural effusions. Bilateral lower lobe right greater than left pulmonary infiltrates. Mild cardiomegaly. No pneumothorax. Vascular coils in  the left upper quadrant. IMPRESSION: 1. Tiny pleural effusions. Bibasilar right greater than left infiltrates. 2. Cardiomegaly Electronically Signed   By: Jasmine Pang M.D.   On: 02/07/2018 20:53   Dg Lumbar Spine Complete  Result Date: 02/07/2018 CLINICAL DATA:  Back pain with history of fall EXAM: LUMBAR SPINE - COMPLETE 4+ VIEW COMPARISON:  CT 01/30/2017 FINDINGS: Vascular coils in the left upper quadrant. 5 non rib-bearing lumbar type vertebra. Vertebral body heights are maintained. Moderate degenerative change at L4-L5 with mild degenerative change at L1-L2 and L2-L3. aortic vascular calcification. IMPRESSION: No acute osseous abnormality. Degenerative changes of the spine most notable at L4-L5. Electronically Signed   By: Jasmine Pang M.D.   On: 02/07/2018 20:55        Scheduled Meds: . amiodarone  200 mg Oral Daily  .  apixaban  5 mg Oral BID  . azithromycin  500 mg Oral Q24H  . carvedilol  6.25 mg Oral BID WC  . cholecalciferol  1,000 Units Oral BID  . clopidogrel  75 mg Oral Q breakfast  . ezetimibe  10 mg Oral Daily  . feeding supplement (ENSURE ENLIVE)  237 mL Oral Daily  . [START ON 02/10/2018] Influenza vac split quadrivalent PF  0.5 mL Intramuscular Tomorrow-1000  . insulin aspart  0-5 Units Subcutaneous QHS  . insulin aspart  0-9 Units Subcutaneous TID WC  . insulin glargine  6 Units Subcutaneous QHS  . levothyroxine  50 mcg Oral QAC breakfast  . simvastatin  40 mg Oral QHS  . venlafaxine XR  150 mg Oral Daily   Continuous Infusions: . cefTRIAXone (ROCEPHIN)  IV Stopped (02/08/18 2240)     LOS: 2 days     Marcellus Scott, MD, FACP, [facility]. [facility] Pager [phone]  If 7PM-7AM, please contact night-coverage [email or web address] Password [email or web address] 02/09/2018, 2:42 PM

## 2018-02-10 DIAGNOSIS — J9621 Acute and chronic respiratory failure with hypoxia: Secondary | ICD-10-CM

## 2018-02-10 DIAGNOSIS — Z23 Encounter for immunization: Secondary | ICD-10-CM | POA: Diagnosis not present

## 2018-02-10 LAB — GLUCOSE, CAPILLARY
Glucose-Capillary: 168 mg/dL — ABNORMAL HIGH (ref 70–99)
Glucose-Capillary: 266 mg/dL — ABNORMAL HIGH (ref 70–99)

## 2018-02-10 MED ORDER — AMOXICILLIN-POT CLAVULANATE 875-125 MG PO TABS: 1.0000 | ORAL_TABLET | Freq: Two times a day (BID) | ORAL | 0 refills | Status: AC

## 2018-02-10 MED ORDER — ENSURE ENLIVE PO LIQD: 237.0000 mL | Freq: Every day | ORAL | Status: DC

## 2018-02-10 NOTE — Discharge Instructions (Signed)

## 2018-02-10 NOTE — Care Management Note (Signed)
Case Management Note  Patient Details  Name: [name] MRN: [id number] Date of Birth: 1945/10/06  Subjective/Objective:                    Action/Plan:  Spoke w patient and family in room. Patient states that she has canes and RW at home. Referral placed to [facility] for vestibular PT.  Expected Discharge Date:  02/10/18               Expected Discharge Plan:  Home w Home Health Services  In-House Referral:     Discharge planning Services  CM Consult  Post Acute Care Choice:  Home Health Choice offered to:  Patient  DME Arranged:    DME Agency:     HH Arranged:  PT HH Agency:  [facility]  Status of Service:  Completed, signed off  If discussed at Long Length of Stay Meetings, dates discussed:    Additional Comments:  Lawerance Sabal, RN 02/10/2018, 1:17 PM

## 2018-02-10 NOTE — Discharge Summary (Signed)
Physician Discharge Summary  [name]:[id number] DOB: 01-12-1946  PCP: Dorisann Frames, MD  Admit date: 02/07/2018 Discharge date: 02/10/2018  Recommendations for Outpatient Follow-up:  1. Dr. Lucky Cowboy, New PCP in 5 days with repeat labs (CBC & BMP). 2. Dr. Dorisann Frames, Endocrinology in 2 weeks to follow-up regarding DM and hypothyroid management. 3. Recommend repeating chest x-ray in 4 weeks to insure resolution of pneumonia findings. 4. Consider repeating A1c as outpatient if not recently done and TSH in 4 weeks.  Home Health: PT Equipment/Devices: None  Discharge Condition: Improved and stable CODE STATUS: Full Diet recommendation: Heart healthy & diabetic diet.  Discharge Diagnoses:  Principal Problem:   CAP (community acquired pneumonia) Active Problems:   CAD- CABG X 4 10/08   Diabetes (HCC)   Essential hypertension   PAF (paroxysmal atrial fibrillation) (HCC)   Chronic systolic CHF (congestive heart failure) (HCC)   Brief Summary: 72 year old married female, has a cane and walker at home but uses cane only when she goes out of her home, PMH of CAD, CABG, ICM (LVEF 25-30% by TTE 09/03/2017), AICD, chronic systolic CHF, postop PAF on Eliquis, DM 2, HTN, HLD, hypothyroid, exposed to her neighbor with URI symptoms, presented to ED 02/07/2018 due to 2 weeks history of cough productive of yellow sputum, dyspnea and congestion which significantly worsened 1 to 2 days PTA with fever and generalized weakness.  In ED febrile 102.8 F.  Did not meet sepsis criteria on admission.  Chest x-ray showed bibasilar right greater than left infiltrates.  Admitted for community-acquired pneumonia.     Assessment & Plan:   Community-acquired pneumonia: Blood cultures x2: Negative to date.  Chest x-ray showed bibasilar right greater than left infiltrate.  Flu panel PCR negative.  Lactate normal.  Urine pneumococcal antigen negative.  Reports taking flu shot and pneumonia shot  last year.  Empirically started on IV ceftriaxone and azithromycin and completed 3 days course. Clinically improved.  Transitioned to oral Augmentin at discharge to complete total 7 days treatment.  Will need follow-up chest x-ray in 4 weeks to insure resolution of pneumonia findings.   Acute on chronic respiratory failure with hypoxia: Likely related to pneumonia and chronic CHF.  Acute respiratory failure resolved.  Patient reports that she uses oxygen via nasal cannula at bedtime, cannot recollect dose.  Continue.  Follow-up with PCP.  ICM/chronic systolic CHF: Clinically was on the dry side on admission.  Diuretics temporarily held.  Brief and gentle IV fluids and then encourage oral fluids.  Resumed diuretics at discharge.  PAF: Continue amiodarone, carvedilol, apixaban.  Currently in sinus rhythm.  Stable.  Essential hypertension: Controlled.  Continue carvedilol.  Temporarily holding torsemide and lisinopril which will be resumed at discharge.  Uncontrolled type II DM with hyperglycemia: Continue prior home dose of Lantus and SSI but needs to followup closely with Dr. Talmage Nap, Endocrinology regarding further management.  A1c 05/11/2017: 8.8.  Not sure if she has a more recent A1c through her endocrinologist.  In the hospital she was treated with Lantus and SSI, was mildly uncontrolled and fluctuating.  Hyperlipidemia: Continue statins and Zetia.  CAD status post CABG/AICD: No anginal symptoms.  Continue Plavix, statins and beta-blockers.  Defer to outpatient cardiology as to whether she needs to remain on Plavix while on Eliquis which increases her bleeding risk.  Unsteady gait and frequent falls: Patient reports multiple falls at home fortunately without any major injuries, last fall 3 weeks PTA.  PT evaluated and recommend home health  at discharge.  Normocytic anemia: Unclear etiology.  Stable.  Outpatient follow-up.  Hypothyroid: Clinically euthyroid.  TSH 01/16/2018: 6.03.   Consider repeating TSH in 4 weeks and if elevated may need dose adjustment.  Stage III chronic kidney disease: Creatinine at baseline and stable.   Consultants:  None  Procedures:  None   Discharge Instructions  Discharge Instructions    (HEART FAILURE PATIENTS) Call MD:  Anytime you have any of the following symptoms: 1) 3 pound weight gain in 24 hours or 5 pounds in 1 week 2) shortness of breath, with or without a dry hacking cough 3) swelling in the hands, feet or stomach 4) if you have to sleep on extra pillows at night in order to breathe.   Complete by:  As directed    Call MD for:  difficulty breathing, headache or visual disturbances   Complete by:  As directed    Call MD for:  extreme fatigue   Complete by:  As directed    Call MD for:  persistant dizziness or light-headedness   Complete by:  As directed    Call MD for:  temperature >100.4   Complete by:  As directed    Diet - low sodium heart healthy   Complete by:  As directed    Diet Carb Modified   Complete by:  As directed    Increase activity slowly   Complete by:  As directed        Medication List    TAKE these medications   amiodarone 200 MG tablet Commonly known as:  PACERONE Take 1 tablet (200 mg total) by mouth daily.   amoxicillin-clavulanate 875-125 MG tablet Commonly known as:  AUGMENTIN Take 1 tablet by mouth 2 (two) times daily for 4 days.   apixaban 5 MG Tabs tablet Commonly known as:  ELIQUIS Take 1 tablet (5 mg total) by mouth 2 (two) times daily.   carvedilol 6.25 MG tablet Commonly known as:  COREG Take 1 tablet (6.25 mg total) by mouth 2 (two) times daily with a meal.   cholecalciferol 1000 units tablet Commonly known as:  VITAMIN D Take 1,000 Units by mouth 2 (two) times daily.   clopidogrel 75 MG tablet Commonly known as:  PLAVIX Take 1 tablet (75 mg total) by mouth daily with breakfast.   ezetimibe 10 MG tablet Commonly known as:  ZETIA Take 1 tablet (10 mg total)  by mouth daily.   feeding supplement (ENSURE ENLIVE) Liqd Take 237 mLs by mouth daily.   insulin aspart 100 UNIT/ML injection Commonly known as:  novoLOG Inject 0-15 Units into the skin 3 (three) times daily with meals.   insulin glargine 100 UNIT/ML injection Commonly known as:  LANTUS Inject 0.06 mLs (6 Units total) into the skin at bedtime.   levothyroxine 50 MCG tablet Commonly known as:  SYNTHROID, LEVOTHROID Take 1 tablet (50 mcg total) by mouth daily.   losartan 25 MG tablet Commonly known as:  COZAAR Take 0.5 tablets (12.5 mg total) by mouth daily.   simvastatin 40 MG tablet Commonly known as:  ZOCOR Take 1 tablet (40 mg total) by mouth at bedtime.   torsemide 20 MG tablet Commonly known as:  DEMADEX Take 1 tablet (20 mg total) by mouth daily.   venlafaxine XR 150 MG 24 hr capsule Commonly known as:  EFFEXOR-XR Take 1 capsule (150 mg total) by mouth daily.   VITAMIN B-12 PO Take by mouth daily.   vitamin C 500 MG tablet Commonly  known as:  ASCORBIC ACID Take 500 mg by mouth daily.      Follow-up Information    Dorisann Frames, MD. Schedule an appointment as soon as possible for a visit in 2 week(s).   Specialty:  Endocrinology Contact information: [address] [place] [address] [phone]        Lucky Cowboy, MD. Schedule an appointment as soon as possible for a visit in 5 day(s).   Specialty:  Internal Medicine Why:  To be seen with repeat labs (CBC & BMP).  Recommend repeating chest x-ray in 4 weeks. Contact information: [address] [place] [address] [phone]          Allergies  Allergen Reactions  . Crestor [Rosuvastatin] Other (See Comments)    unknown  . Lipitor [Atorvastatin] Other (See Comments)    Very bad pain in hips and joints      Procedures/Studies: Dg Chest 2 View  Result Date: 02/07/2018 CLINICAL DATA:  Chest pain EXAM: CHEST - 2 VIEW COMPARISON:  01/16/2018,  10/06/2017 FINDINGS: Left-sided pacing device. Post sternotomy changes. Tiny pleural effusions. Bilateral lower lobe right greater than left pulmonary infiltrates. Mild cardiomegaly. No pneumothorax. Vascular coils in the left upper quadrant. IMPRESSION: 1. Tiny pleural effusions. Bibasilar right greater than left infiltrates. 2. Cardiomegaly Electronically Signed   By: Jasmine Pang M.D.   On: 02/07/2018 20:53   Dg Lumbar Spine Complete  Result Date: 02/07/2018 CLINICAL DATA:  Back pain with history of fall EXAM: LUMBAR SPINE - COMPLETE 4+ VIEW COMPARISON:  CT 01/30/2017 FINDINGS: Vascular coils in the left upper quadrant. 5 non rib-bearing lumbar type vertebra. Vertebral body heights are maintained. Moderate degenerative change at L4-L5 with mild degenerative change at L1-L2 and L2-L3. aortic vascular calcification. IMPRESSION: No acute osseous abnormality. Degenerative changes of the spine most notable at L4-L5. Electronically Signed   By: Jasmine Pang M.D.   On: 02/07/2018 20:55      Subjective: Denies complaints.  Feels much improved compared to admission.  Denies dyspnea, cough, chest pain, palpitations, dizziness or lightheadedness.  Eager to go home.  Discharge Exam:  Vitals:   02/09/18 0954 02/09/18 1940 02/10/18 0438 02/10/18 0846  BP: 137/75 (!) 131/45 (!) 127/47 (!) 140/50  Pulse: 66 60 67 66  Resp: 18 18 18 18   Temp: 98.8 F (37.1 C) 98.5 F (36.9 C) 98.8 F (37.1 C)   TempSrc: Oral Oral Oral   SpO2: 90% 98% 93%   Weight:   60.1 kg   Height:        General exam: Pleasant elderly female, moderately built and nourished, sitting up comfortably in bed.  Does not appear in any distress. Respiratory system: Occasional basal crackles.  Rest of lung fields clear to auscultation without wheezing or rhonchi. Respiratory effort normal. Cardiovascular system: S1 & S2 heard, RRR. No JVD, murmurs, rubs, gallops or clicks. No pedal edema.    Telemetry personally reviewed: SB in the  50s-SR. Gastrointestinal system: Abdomen is nondistended, soft and nontender. No organomegaly or masses felt. Normal bowel sounds heard.  Central nervous system: Alert and oriented. No focal neurological deficits.  Extremities: Symmetric 5 x 5 power. Skin: No rashes, lesions or ulcers Psychiatry: Judgement and insight appear normal. Mood & affect appropriate.     The results of significant diagnostics from this hospitalization (including imaging, microbiology, ancillary and laboratory) are listed below for reference.     Microbiology: Recent Results (from the past 240 hour(s))  Blood culture (routine  x 2)     Status: None (Preliminary result)   Collection Time: 02/07/18 10:06 PM  Result Value Ref Range Status   Specimen Description BLOOD LEFT ANTECUBITAL  Final   Special Requests   Final    BOTTLES DRAWN AEROBIC AND ANAEROBIC Blood Culture results may not be optimal due to an inadequate volume of blood received in culture bottles   Culture   Final    NO GROWTH 2 DAYS Performed at [facility], [address] [place], [place] [address]    Report Status PENDING  Incomplete  Blood culture (routine x 2)     Status: None (Preliminary result)   Collection Time: 02/07/18 10:10 PM  Result Value Ref Range Status   Specimen Description BLOOD LEFT ANTECUBITAL  Final   Special Requests   Final    BOTTLES DRAWN AEROBIC AND ANAEROBIC Blood Culture results may not be optimal due to an inadequate volume of blood received in culture bottles   Culture   Final    NO GROWTH 2 DAYS Performed at [facility], [address], [place], [place] [address]    Report Status PENDING  Incomplete     Labs: CBC: Recent Labs  Lab 02/07/18 2029 02/08/18 0559 02/09/18 0521  WBC 15.4* 13.4* 15.4*  HGB 9.5* 9.0* 8.8*  HCT 31.5* 29.0* 28.8*  MCV 92.4 90.9 91.4  PLT 440* 398 402*   Basic Metabolic Panel: Recent Labs  Lab 02/07/18 2029 02/08/18 0559  NA 136 140  K 3.9 3.5  CL 96* 101   CO2 28 31  GLUCOSE 253* 164*  BUN 22 19  CREATININE 1.21* 1.12*  CALCIUM 8.9 8.7*   CBG: Recent Labs  Lab 02/09/18 1202 02/09/18 1637 02/09/18 2100 02/10/18 0809 02/10/18 1204  GLUCAP 202* 202* 206* 168* 266*   Urinalysis    Component Value Date/Time   COLORURINE YELLOW 02/08/2018 0121   APPEARANCEUR CLEAR 02/08/2018 0121   LABSPEC 1.006 02/08/2018 0121   PHURINE 5.0 02/08/2018 0121   GLUCOSEU NEGATIVE 02/08/2018 0121   HGBUR NEGATIVE 02/08/2018 0121   BILIRUBINUR NEGATIVE 02/08/2018 0121   BILIRUBINUR moderate 09/12/2011 1359   KETONESUR NEGATIVE 02/08/2018 0121   PROTEINUR NEGATIVE 02/08/2018 0121   UROBILINOGEN 0.2 09/12/2011 1359   UROBILINOGEN 1.0 02/20/2009 0846   NITRITE POSITIVE (A) 02/08/2018 0121   LEUKOCYTESUR NEGATIVE 02/08/2018 0121    I discussed in detail with patient's daughter.  Updated care and answered questions.  She indicated that she would assist patient with follow-up appointment with PCP.  Time coordinating discharge: 35 minutes  SIGNED:  Marcellus Scott, MD, FACP, [facility]. [facility] Pager [phone]  If 7PM-7AM, please contact night-coverage [email or web address] Password [email or web address] 02/10/2018, 12:49 PM

## 2018-02-12 ENCOUNTER — Other Ambulatory Visit: Payer: Self-pay

## 2018-02-12 ENCOUNTER — Telehealth (HOSPITAL_COMMUNITY): Payer: Self-pay | Admitting: Pharmacist

## 2018-02-12 LAB — CULTURE, BLOOD (ROUTINE X 2)
CULTURE: NO GROWTH
Culture: NO GROWTH

## 2018-02-12 NOTE — Telephone Encounter (Signed)
Simvastatin 40 mg daily PA approved by [facility] Part D through 02/12/19.   Tyler Deis. Bonnye Fava, PharmD, BCPS, CPP Clinical Pharmacist Phone: [phone] 02/12/2018 2:04 PM

## 2018-02-12 NOTE — Patient Outreach (Signed)
[facility]) Care Management  02/12/2018  ALEILA SYVERSON 1945-06-11 [id number]   Telephone Screen  Referral Date: 02/12/18 Referral Source: [employer] Report Referral Reason: "Tier 4 high risk patient list" Insurance: [employer]   Outreach attempt # 1 to patient. Spoke with patient. She voices that she is doing fine and things are going well. She lives with supportive spouse who is able to assist her as needed. Patient reports that she was recntly dischagred form the hopsital for PNA. She is pleased to share how much better she is doing now. She voices that she has a new PCP-Dr. Oneta Rack. She has MD office number and is aware that she needs to call and make an appt. She denies any issues with transportation at this time. Patient confirmed that she has all her meds but does not wish to review meds over the phone at this time. RN CM discussed [facility] services with patient. Patient glad to know services are available but declines needing any of those services at this time. She was agreeable to RN CM mailing out info for future reference.     Plan: RN CM will close case at this time. RN CM will send [facility] info via mail to patient.    [name], RN,BSN,CCM [facility] Care Management Telephonic Care Management Coordinator Direct Phone: [phone] Toll Free: [phone] Fax: [phone]

## 2018-02-13 ENCOUNTER — Encounter: Payer: Self-pay | Admitting: Internal Medicine

## 2018-02-14 DIAGNOSIS — I5022 Chronic systolic (congestive) heart failure: Secondary | ICD-10-CM | POA: Diagnosis not present

## 2018-02-14 DIAGNOSIS — J189 Pneumonia, unspecified organism: Secondary | ICD-10-CM | POA: Diagnosis not present

## 2018-02-14 DIAGNOSIS — E1165 Type 2 diabetes mellitus with hyperglycemia: Secondary | ICD-10-CM | POA: Diagnosis not present

## 2018-02-14 DIAGNOSIS — E1122 Type 2 diabetes mellitus with diabetic chronic kidney disease: Secondary | ICD-10-CM | POA: Diagnosis not present

## 2018-02-27 ENCOUNTER — Encounter (HOSPITAL_COMMUNITY): Payer: Self-pay

## 2018-02-27 ENCOUNTER — Telehealth (HOSPITAL_COMMUNITY): Payer: Self-pay | Admitting: Pharmacist

## 2018-02-27 ENCOUNTER — Ambulatory Visit (HOSPITAL_COMMUNITY)
Admission: RE | Admit: 2018-02-27 | Discharge: 2018-02-27 | Disposition: A | Discharge status: Home / Self Care | Payer: Medicare HMO | Source: Ambulatory Visit | Attending: Internal Medicine | Admitting: Internal Medicine

## 2018-02-27 ENCOUNTER — Encounter: Payer: Self-pay | Admitting: Internal Medicine

## 2018-02-27 ENCOUNTER — Ambulatory Visit: Payer: Medicare HMO | Admitting: Internal Medicine

## 2018-02-27 VITALS — BP 114/62 | HR 63 | Ht 62.0 in | Wt 131.8 lb

## 2018-02-27 VITALS — BP 118/64 | HR 59 | Wt 131.4 lb

## 2018-02-27 DIAGNOSIS — Z95810 Presence of automatic (implantable) cardiac defibrillator: Secondary | ICD-10-CM | POA: Insufficient documentation

## 2018-02-27 DIAGNOSIS — E1122 Type 2 diabetes mellitus with diabetic chronic kidney disease: Secondary | ICD-10-CM | POA: Insufficient documentation

## 2018-02-27 DIAGNOSIS — Z951 Presence of aortocoronary bypass graft: Secondary | ICD-10-CM | POA: Insufficient documentation

## 2018-02-27 DIAGNOSIS — I5022 Chronic systolic (congestive) heart failure: Secondary | ICD-10-CM | POA: Diagnosis not present

## 2018-02-27 DIAGNOSIS — D649 Anemia, unspecified: Secondary | ICD-10-CM

## 2018-02-27 DIAGNOSIS — I951 Orthostatic hypotension: Secondary | ICD-10-CM | POA: Insufficient documentation

## 2018-02-27 DIAGNOSIS — Z8249 Family history of ischemic heart disease and other diseases of the circulatory system: Secondary | ICD-10-CM | POA: Insufficient documentation

## 2018-02-27 DIAGNOSIS — Z7901 Long term (current) use of anticoagulants: Secondary | ICD-10-CM | POA: Insufficient documentation

## 2018-02-27 DIAGNOSIS — I2510 Atherosclerotic heart disease of native coronary artery without angina pectoris: Secondary | ICD-10-CM | POA: Diagnosis not present

## 2018-02-27 DIAGNOSIS — I130 Hypertensive heart and chronic kidney disease with heart failure and stage 1 through stage 4 chronic kidney disease, or unspecified chronic kidney disease: Secondary | ICD-10-CM | POA: Insufficient documentation

## 2018-02-27 DIAGNOSIS — N183 Chronic kidney disease, stage 3 unspecified: Secondary | ICD-10-CM

## 2018-02-27 DIAGNOSIS — Z833 Family history of diabetes mellitus: Secondary | ICD-10-CM | POA: Diagnosis not present

## 2018-02-27 DIAGNOSIS — Z79899 Other long term (current) drug therapy: Secondary | ICD-10-CM | POA: Insufficient documentation

## 2018-02-27 DIAGNOSIS — E785 Hyperlipidemia, unspecified: Secondary | ICD-10-CM | POA: Insufficient documentation

## 2018-02-27 DIAGNOSIS — I493 Ventricular premature depolarization: Secondary | ICD-10-CM | POA: Insufficient documentation

## 2018-02-27 DIAGNOSIS — Z794 Long term (current) use of insulin: Secondary | ICD-10-CM | POA: Diagnosis not present

## 2018-02-27 DIAGNOSIS — I252 Old myocardial infarction: Secondary | ICD-10-CM | POA: Diagnosis not present

## 2018-02-27 DIAGNOSIS — E1151 Type 2 diabetes mellitus with diabetic peripheral angiopathy without gangrene: Secondary | ICD-10-CM | POA: Diagnosis not present

## 2018-02-27 DIAGNOSIS — Z79890 Hormone replacement therapy (postmenopausal): Secondary | ICD-10-CM | POA: Diagnosis not present

## 2018-02-27 DIAGNOSIS — I255 Ischemic cardiomyopathy: Secondary | ICD-10-CM

## 2018-02-27 DIAGNOSIS — F329 Major depressive disorder, single episode, unspecified: Secondary | ICD-10-CM | POA: Diagnosis not present

## 2018-02-27 DIAGNOSIS — Z7902 Long term (current) use of antithrombotics/antiplatelets: Secondary | ICD-10-CM | POA: Insufficient documentation

## 2018-02-27 DIAGNOSIS — I480 Paroxysmal atrial fibrillation: Secondary | ICD-10-CM | POA: Insufficient documentation

## 2018-02-27 DIAGNOSIS — E039 Hypothyroidism, unspecified: Secondary | ICD-10-CM | POA: Insufficient documentation

## 2018-02-27 LAB — CUP PACEART INCLINIC DEVICE CHECK
Brady Statistic RV Percent Paced: 0.01 %
Date Time Interrogation Session: 20191023135752
HighPow Impedance: 55 Ohm
Lead Channel Sensing Intrinsic Amplitude: 7.5 mV
Lead Channel Setting Pacing Amplitude: 2.5 V
Lead Channel Setting Sensing Sensitivity: 0.3 mV
MDC IDC LEAD IMPLANT DT: 20190531
MDC IDC LEAD LOCATION: 753860
MDC IDC MSMT BATTERY REMAINING LONGEVITY: 130 mo
MDC IDC MSMT BATTERY VOLTAGE: 3.04 V
MDC IDC MSMT LEADCHNL RV IMPEDANCE VALUE: 342 Ohm
MDC IDC MSMT LEADCHNL RV IMPEDANCE VALUE: 399 Ohm
MDC IDC MSMT LEADCHNL RV PACING THRESHOLD AMPLITUDE: 1 V
MDC IDC MSMT LEADCHNL RV PACING THRESHOLD PULSEWIDTH: 0.4 ms
MDC IDC PG IMPLANT DT: 20190531
MDC IDC SET LEADCHNL RV PACING PULSEWIDTH: 0.4 ms

## 2018-02-27 LAB — IRON,TIBC AND FERRITIN PANEL
Ferritin: 183 ng/mL — ABNORMAL HIGH (ref 15–150)
Iron Saturation: 14 % — ABNORMAL LOW (ref 15–55)
Iron: 39 ug/dL (ref 27–139)
TIBC: 276 ug/dL (ref 250–450)
UIBC: 237 ug/dL (ref 118–369)

## 2018-02-27 LAB — CBC
HEMOGLOBIN: 10.2 g/dL — AB (ref 11.1–15.9)
Hematocrit: 32.2 % — ABNORMAL LOW (ref 34.0–46.6)
MCH: 29.1 pg (ref 26.6–33.0)
MCHC: 31.7 g/dL (ref 31.5–35.7)
MCV: 92 fL (ref 79–97)
Platelets: 308 10*3/uL (ref 150–450)
RBC: 3.51 x10E6/uL — ABNORMAL LOW (ref 3.77–5.28)
RDW: 14.2 % (ref 12.3–15.4)
WBC: 8.1 10*3/uL (ref 3.4–10.8)

## 2018-02-27 LAB — TSH: TSH: 4.39 u[IU]/mL (ref 0.450–4.500)

## 2018-02-27 LAB — HEPATIC FUNCTION PANEL
ALK PHOS: 112 IU/L (ref 39–117)
ALT: 37 IU/L — AB (ref 0–32)
AST: 50 IU/L — AB (ref 0–40)
Albumin: 4 g/dL (ref 3.5–4.8)
BILIRUBIN, DIRECT: 0.1 mg/dL (ref 0.00–0.40)
Bilirubin Total: 0.2 mg/dL (ref 0.0–1.2)
Total Protein: 7 g/dL (ref 6.0–8.5)

## 2018-02-27 MED ORDER — POTASSIUM CHLORIDE CRYS ER 20 MEQ PO TBCR: 20.0000 meq | EXTENDED_RELEASE_TABLET | Freq: Every day | ORAL | 3 refills | Status: DC

## 2018-02-27 MED ORDER — TORSEMIDE 20 MG PO TABS: 40.0000 mg | ORAL_TABLET | Freq: Every day | ORAL | 3 refills | Status: DC

## 2018-02-27 MED ORDER — CEPHALEXIN 500 MG PO CAPS: 500.0000 mg | ORAL_CAPSULE | Freq: Three times a day (TID) | ORAL | 0 refills | Status: AC

## 2018-02-27 NOTE — Addendum Note (Signed)
Encounter addended by: Burna Sis, LCSW on: 02/27/2018 1:22 PM  Actions taken: Visit Navigator Flowsheet section accepted

## 2018-02-27 NOTE — Patient Instructions (Signed)
Medication Instructions:  Your physician has recommended you make the following change in your medication:   Begin Keflex, 500mg  tablet, every 8 hrs for 7 days   Labwork: You will have labs drawn today: CBC, TSH, LFT, Anemia panel  Testing/Procedures: None ordered.  Follow-Up:  Your physician recommends that you schedule a follow-up appointment in:   9 months with Gypsy Balsam, NP   Any Other Special Instructions Will Be Listed Below (If Applicable).     If you need a refill on your cardiac medications before your next appointment, please call your pharmacy.

## 2018-02-27 NOTE — Patient Instructions (Signed)
START Potassium 20 meq, one tab daily   Labs needed in 7 days  Your physician recommends that you schedule a follow-up appointment in: 3 weeks  in the Advanced Practitioners (PA/NP) Clinic   Your physician recommends that you schedule a follow-up appointment in: 10 weeks with Dr Shirlee Latch   Do the following things EVERYDAY: 1) Weigh yourself in the morning before breakfast. Write it down and keep it in a log. 2) Take your medicines as prescribed 3) Eat low salt foods-Limit salt (sodium) to 2000 mg per day.  4) Stay as active as you can everyday 5) Limit all fluids for the day to less than 2 liters

## 2018-02-27 NOTE — Addendum Note (Signed)
Encounter addended by: Sherald Hess, NP on: 02/27/2018 11:41 AM  Actions taken: Sign clinical note

## 2018-02-27 NOTE — Progress Notes (Addendum)
Advanced Heart Failure Clinic Note  PCP: Georgianne Fick, MD Cardiology: Dr. Rennis Golden HF Cardiology: Dr. Shirlee Latch EP: Dr Graciela Husbands  72 yo with history of CAD and ischemic cardiomyopathy presents for followup of CAD and CHF.  She had CABG x 4 in 2008.  Echo in 3/13 was 40-45%.  By 10/18, EF had dropped to 20% by echo.    She was admitted in 1/19 with CHF exacerbation/NSTEMI.  LHC showed occluded SVG-PDA and serial 70% and 90% stenoses in the proximal to mid RCA.  She had atherectomy with DES x 2 to RCA.  Echo in 1/19 with EF 20-25%.  She was diuresed and had left thoracentesis for pleural effusion.   She developed lightheadedness/orthostasis and AKI with hyperkalemia. Entresto and spironolactone were stopped.   S/p Medtronic ICD placement 10/05/17.  Admitted 10/3 through 02/10/18. Treated CAP with antibiotics. Diuretics initially held but restarted.   Today she returns for HF follow up. Earlier today she was evaluated EP. . Torsemide was increased to 40 mg daily for volume overload. Overall feeling fine. Denies SOB/PND/Orthopnea. Having some leg edema.  No bleeding problems. Appetite ok. No fever or chills. Weight at home  126-130 pounds. Taking all medications'  Labs (1/19): K 3.9, creatinine 0.88 Labs (4/19): K 5.1, Creatinine 1.31 Labs (5/19): K 5.8 => 5.5, creatinine 1.21 Labs (12/27/2017): K 4.7 Creatinine 1.18  Labs 02/08/2018: K 3.5 Creatinine 1.12 Hgb 9   PMH: 1. CAD: s/p CABG x 4 in 2008.  - NSTEMI 1/19 with LHC showing patent LIMA-LAD, 80% proximal LAD, 70% proximal RCA, 90% mid RCA, SVG-ramus patent, SVG-OM patent, SVG-PDA occluded.  Patient had atherectomy with DES x 2 to prox and mid RCA.   2. Chronic systolic CHF: Ischemic cardiomyopathy.  - Echo (3/13): EF 45-50%.  - Echo (10/18): EF 20%.  - Echo (1/19): EF 20-25%, diffuse hypokinesis, mild LV dilation.  - Echo (4/19): EF 25-30%.  3. HTN 4. H/o left pleural effusion 5. Hyperlipidemia 6. Type II diabetes 7. Atrial  fibrillation: Paroxysmal, noted only post-op CABG.  8. Hypothyroidism 9. Depression 10. Hyperkalemia with spironolactone.  11. PAD: ABIs 5/19 with normal ABI on right, left noncompressible.   Review of systems complete and found to be negative unless listed in HPI.   Social History   Socioeconomic History  . Marital status: Married    Spouse name: Not on file  . Number of children: 1  . Years of education: 22  . Highest education level: Not on file  Occupational History    Employer: [employer]  Social Needs  . Financial resource strain: Not on file  . Food insecurity:    Worry: Not on file    Inability: Not on file  . Transportation needs:    Medical: Not on file    Non-medical: Not on file  Tobacco Use  . Smoking status: Never Smoker  . Smokeless tobacco: Never Used  Substance and Sexual Activity  . Alcohol use: No  . Drug use: No  . Sexual activity: Never    Birth control/protection: Abstinence  Lifestyle  . Physical activity:    Days per week: Not on file    Minutes per session: Not on file  . Stress: Not on file  Relationships  . Social connections:    Talks on phone: Not on file    Gets together: Not on file    Attends religious service: Not on file    Active member of club or organization: Not on file  Attends meetings of clubs or organizations: Not on file    Relationship status: Not on file  . Intimate partner violence:    Fear of current or ex partner: Not on file    Emotionally abused: Not on file    Physically abused: Not on file    Forced sexual activity: Not on file  Other Topics Concern  . Not on file  Social History Narrative  . Not on file   Family History  Problem Relation Age of Onset  . Diabetes Mother 65  . Heart disease Mother   . Hyperlipidemia Mother   . Cancer Father   . Heart attack Father 22  . Cancer Maternal Grandmother   . Cancer Paternal Grandmother   . Thyroid disease Sister   . Diabetes Child     Current  Outpatient Medications  Medication Sig Dispense Refill  . amiodarone (PACERONE) 200 MG tablet Take 1 tablet (200 mg total) by mouth daily. 30 tablet 3  . apixaban (ELIQUIS) 5 MG TABS tablet Take 1 tablet (5 mg total) by mouth 2 (two) times daily. 60 tablet 3  . carvedilol (COREG) 6.25 MG tablet Take 1 tablet (6.25 mg total) by mouth 2 (two) times daily with a meal. 180 tablet 3  . cephALEXin (KEFLEX) 500 MG capsule Take 1 capsule (500 mg total) by mouth every 8 (eight) hours for 7 days. 21 capsule 0  . cholecalciferol (VITAMIN D) 1000 UNITS tablet Take 1,000 Units by mouth 2 (two) times daily.     . clopidogrel (PLAVIX) 75 MG tablet Take 1 tablet (75 mg total) by mouth daily with breakfast. 90 tablet 3  . Cyanocobalamin (VITAMIN B-12 PO) Take by mouth daily.    Marland Kitchen ezetimibe (ZETIA) 10 MG tablet Take 1 tablet (10 mg total) by mouth daily. 90 tablet 3  . feeding supplement, ENSURE ENLIVE, (ENSURE ENLIVE) LIQD Take 237 mLs by mouth daily.    . insulin aspart (NOVOLOG) 100 UNIT/ML injection Inject 0-15 Units into the skin 3 (three) times daily with meals. 10 mL 11  . insulin glargine (LANTUS) 100 UNIT/ML injection Inject 0.06 mLs (6 Units total) into the skin at bedtime. 10 mL 11  . levothyroxine (SYNTHROID, LEVOTHROID) 50 MCG tablet Take 1 tablet (50 mcg total) by mouth daily. 90 tablet 3  . losartan (COZAAR) 25 MG tablet Take 0.5 tablets (12.5 mg total) by mouth daily. 15 tablet 5  . simvastatin (ZOCOR) 40 MG tablet Take 1 tablet (40 mg total) by mouth at bedtime. 90 tablet 3  . torsemide (DEMADEX) 20 MG tablet Take 2 tablets (40 mg total) by mouth daily. 180 tablet 3  . venlafaxine XR (EFFEXOR-XR) 150 MG 24 hr capsule Take 1 capsule (150 mg total) by mouth daily. 90 capsule 2  . vitamin C (ASCORBIC ACID) 500 MG tablet Take 500 mg by mouth daily.    . potassium chloride SA (K-DUR,KLOR-CON) 20 MEQ tablet Take 1 tablet (20 mEq total) by mouth daily. 90 tablet 3   No current facility-administered  medications for this encounter.    Vitals:   02/27/18 1110  BP: 118/64  Pulse: (!) 59  SpO2: 98%  Weight: 59.6 kg (131 lb 6.4 oz)   Wt Readings from Last 3 Encounters:  02/27/18 59.6 kg (131 lb 6.4 oz)  02/27/18 59.8 kg (131 lb 12.8 oz)  02/10/18 60.1 kg (132 lb 6.4 oz)    General: NAD.  No resp difficulty. Walked in the clinic.  HEENT: normal Neck: supple. JVP  9-10 . Carotids 2+ bilat; no bruits. No lymphadenopathy or thryomegaly appreciated. Cor: PMI nondisplaced. Regular rate & rhythm. No rubs, gallops or murmurs. Lungs: clear Abdomen: soft, nontender, nondistended. No hepatosplenomegaly. No bruits or masses. Good bowel sounds. Extremities: no cyanosis, clubbing, rash, R and LLE 1+ edema.  Neuro: alert & orientedx3, cranial nerves grossly intact. moves all 4 extremities w/o difficulty. Affect pleasant   Assessment/Plan: 1. Chronic systolic CHF: Ischemic cardiomyopathy.  Echo 09/03/17 with EF 25-30%.  Medtronic ICD placement 10/05/17.  Optivol: Device checked today by Dr Graciela Husbands.  - NYHA II. Volume status mildly elevated. Torsemide increased to 40 mg daily earlier today. Add 20 meq K . Check BMET next week.  -Continue oreg to 6.25 mg twice a day. Heart rate 59 would not increase bb.  - Continue 12.5 mg losartan daily.  - Had orthostatic hypotension with Entresto. - Off spiro with hyperkalemia 2. CAD:  -No s/s ischemia  - Continue Plavix, Should continue Plavix until January 2020.  - No aspirin with eliquis. .   - Continue simvastatin 40 daily with Zetia 10 daily. Lipids stable 08/13/17.  3. Diabetes: Per PCP. Consider SGLT-2 inhibitor given CHF. No change.  4. Atrial fibrillation: Paroxysmal, noted on post-op CABG.  Regular pulse today.  -Continue eliquis 5 mg twice a day.  5. Orthostatic hypotension/dizziness:  Resolved.  6. PVCs: Multiple PVCs noted on prior ECGs. ?If this could be contributing to cardiomyopathy.   - Holter showed frequent PVCs 10/2017 (20%). No PVCs on EKG.   - Continue amio to 200 mg daily.  - Check TSH, LFTs at Dr Koren Bound office. - She understands she needs yearly eye exams.  7. CKD: Stage 3.  Check BMET next week.   Follow up in 3 weeks with APP and 10 weeks with Dr Shirlee Latch.    Tonye Becket, NP  02/27/2018

## 2018-02-27 NOTE — Telephone Encounter (Signed)
Received a call from [facility] stating that there is an interaction with the patient's spironolactone and KCl. We are monitoring the patient's K levels and patient verified that she is no longer taking spironolactone anyway.   Tyler Deis. Bonnye Fava, PharmD, BCPS, CPP Clinical Pharmacist Phone: [phone] 02/27/2018 1:54 PM

## 2018-02-27 NOTE — Progress Notes (Signed)
Patient Care Team: Georgianne Fick, MD as PCP - General (Internal Medicine) Rennis Golden Lisette Abu, MD as PCP - Cardiology (Cardiology)   HPI    [name] is a 72 y.o. female She has a history of ischemic heart disease.  She presented with a silent MI and underwent bypass surgery 2008.  She had NSTEMI 1/19>> LHC  RCA 70/90% -- atherectomy.   Device is detected atrial fibrillation.  She is on anticoagulation with apixaban  DATE TEST EF   3/13 Echo   40.45 %   10/18 Echo   20 %   1/19 Echo  20 %   4/19 Echo  25%       Date Cr Hgb TSH LFT  9/19 1.12 8.8 6.023 61          She has had problems with swelling.  Her torsemide was decreased from 40--20.  This coincides with optivol  She banged her shin and healing slowly   dizziness is better.  Records and Results Reviewed   Past Medical History:  Diagnosis Date  . Cholelithiases   . Coronary artery disease 02/2007   CABG  . Diabetes (HCC)   . Dyslipidemia   . HTN (hypertension)   . Hypothyroid   . PAF (paroxysmal atrial fibrillation) (HCC)    post op  . S/P CABG x 4 2008    Past Surgical History:  Procedure Laterality Date  . ABDOMINAL HYSTERECTOMY    . CARDIAC CATHETERIZATION  03/01/2007   LAD with 99% prox lesion, at birfucation of Cfx there is 99% lesion, at bifurcation of large OM1 there is 99% lesion, 70% prox lesion at ramus intermedius, 90% lesion in prox RCA (Dr. Claudia Desanctis) - susequent CABG  . CORONARY ARTERY BYPASS GRAFT  03/05/2007   LIMA-LAD, SVG-ramus intermediate. SVG-OM, SVG-PDA (Dr. Donata Clay)  . CORONARY ATHERECTOMY N/A 05/17/2017   Procedure: CORONARY ATHERECTOMY;  Surgeon: Lennette Bihari, MD;  Location: [facility] INVASIVE CV LAB;  Service: Cardiovascular;  Laterality: N/A;  . CORONARY STENT INTERVENTION N/A 05/17/2017   Procedure: CORONARY STENT INTERVENTION;  Surgeon: Lennette Bihari, MD;  Location: [facility] INVASIVE CV LAB;  Service: Cardiovascular;  Laterality: N/A;  . ICD IMPLANT N/A 10/05/2017   Procedure: ICD IMPLANT;  Surgeon: Duke Salvia, MD;  Location: [facility] INVASIVE CV LAB;  Service: Cardiovascular;  Laterality: N/A;  . INCONTINENCE SURGERY  2009  . IR ANGIOGRAM SELECTIVE EACH ADDITIONAL VESSEL  01/31/2017  . IR ANGIOGRAM SELECTIVE EACH ADDITIONAL VESSEL  01/31/2017  . IR ANGIOGRAM VISCERAL SELECTIVE  01/31/2017  . IR EMBO ART  VEN HEMORR LYMPH EXTRAV  INC GUIDE ROADMAPPING  01/31/2017  . IR THORACENTESIS ASP PLEURAL SPACE W/IMG GUIDE  05/11/2017  . IR THORACENTESIS ASP PLEURAL SPACE W/IMG GUIDE  05/18/2017  . IR US GUIDE VASC ACCESS RIGHT  01/31/2017  . LEFT HEART CATH AND CORS/GRAFTS ANGIOGRAPHY N/A 05/15/2017   Procedure: LEFT HEART CATH AND CORS/GRAFTS ANGIOGRAPHY;  Surgeon: Lennette Bihari, MD;  Location: [facility] INVASIVE CV LAB;  Service: Cardiovascular;  Laterality: N/A;  . LEFT HEART CATH AND CORS/GRAFTS ANGIOGRAPHY N/A 05/17/2017   Procedure: LEFT HEART CATH AND CORS/GRAFTS ANGIOGRAPHY;  Surgeon: Lennette Bihari, MD;  Location: [facility] INVASIVE CV LAB;  Service: Cardiovascular;  Laterality: N/A;  . NM MYOCAR PERF WALL MOTION  07/2011   bruce myoview - mild perfusion defect in basal inferolateral & mid inferolateral regions (attenuation artifact, but prior infarct annote be excluded)' EF 49%; low risk  .  TRANSTHORACIC ECHOCARDIOGRAM  07/2011   EF 45-50%, mild septal hypokinesis, mild inferior wall hypokinesis; RV systolc function mildly reduced; borderline LA enlargement; mild MR & TR; AV mildly sclerotic; mild pulm valve regurg     Current Meds  Medication Sig  . amiodarone (PACERONE) 200 MG tablet Take 1 tablet (200 mg total) by mouth daily.  Marland Kitchen apixaban (ELIQUIS) 5 MG TABS tablet Take 1 tablet (5 mg total) by mouth 2 (two) times daily.  . carvedilol (COREG) 6.25 MG tablet Take 1 tablet (6.25 mg total) by mouth 2 (two) times daily with a meal.  . cholecalciferol (VITAMIN D) 1000 UNITS tablet Take 1,000 Units by mouth 2 (two) times daily.   . clopidogrel (PLAVIX) 75 MG tablet Take 1 tablet  (75 mg total) by mouth daily with breakfast.  . Cyanocobalamin (VITAMIN B-12 PO) Take by mouth daily.  Marland Kitchen ezetimibe (ZETIA) 10 MG tablet Take 1 tablet (10 mg total) by mouth daily.  . feeding supplement, ENSURE ENLIVE, (ENSURE ENLIVE) LIQD Take 237 mLs by mouth daily.  . insulin aspart (NOVOLOG) 100 UNIT/ML injection Inject 0-15 Units into the skin 3 (three) times daily with meals.  . insulin glargine (LANTUS) 100 UNIT/ML injection Inject 0.06 mLs (6 Units total) into the skin at bedtime.  Marland Kitchen levothyroxine (SYNTHROID, LEVOTHROID) 50 MCG tablet Take 1 tablet (50 mcg total) by mouth daily.  Marland Kitchen losartan (COZAAR) 25 MG tablet Take 0.5 tablets (12.5 mg total) by mouth daily.  . simvastatin (ZOCOR) 40 MG tablet Take 1 tablet (40 mg total) by mouth at bedtime.  . torsemide (DEMADEX) 20 MG tablet Take 1 tablet (20 mg total) by mouth daily.  Marland Kitchen venlafaxine XR (EFFEXOR-XR) 150 MG 24 hr capsule Take 1 capsule (150 mg total) by mouth daily.  . vitamin C (ASCORBIC ACID) 500 MG tablet Take 500 mg by mouth daily.    Allergies  Allergen Reactions  . Crestor [Rosuvastatin] Other (See Comments)    unknown  . Lipitor [Atorvastatin] Other (See Comments)    Very bad pain in hips and joints      Review of Systems negative except from HPI and PMH  Physical Exam BP 114/62   Pulse 63   Ht 5\' 2"  (1.575 m)   Wt 131 lb 12.8 oz (59.8 kg)   SpO2 98%   BMI 24.11 kg/m  Well developed and well nourished in no acute distress HENT normal E scleral and icterus clear Neck Supple JVP flat;   Clear to ausculation Device pocket well healed; without hematoma- min erythema at incision.  There is no tethering Regular rate and rhythm, no murmurs gallops or rub Soft   No clubbing cyanosis1-2+ Edema Alert and oriented, grossly normal motor and sensory function Skin Warm and Dry  There is an erosion which is healing with surrounding erythema on her right shin  ECG: Sinus Rhythm  @63             Intervals   19/13/47  Axis -check 2 056       Assessment and Plan:  Ischemic cardiomyopathy  Orthostatic hypotension  Diabetic neuropathy  CHF chronic systolic   Anemia  Atrial fibrillation-paroxysmal  ICD-Medtronic single-chamber  The patient's device was interrogated and the information was fully reviewed.  The device was reprogrammed to max longevity   Device seems to have healed well   there is some erythema on the incision line.  We will follow this.  On Anticoagulation    No bleeding issues she has a chronic anemia  which has been variable over the last few years   we will check iron studies.       She has a erosion on her leg with surrounding erythema.  Concerning for early cellulitis.  To protect her device we will put her on antibiotics cephalexin 500 every 8 x1 week.  There is evidence of volume overload on examination with edema as well as optimal.  We will increase her torsemide from 20--40 daily taken both in the morning.   Sherryl Manges

## 2018-02-27 NOTE — Addendum Note (Signed)
Addended by: Oretha Milch on: 02/27/2018 10:50 AM   Modules accepted: Orders

## 2018-03-04 ENCOUNTER — Telehealth: Payer: Self-pay

## 2018-03-04 ENCOUNTER — Telehealth: Payer: Self-pay | Admitting: Internal Medicine

## 2018-03-04 DIAGNOSIS — I493 Ventricular premature depolarization: Secondary | ICD-10-CM

## 2018-03-04 MED ORDER — AMIODARONE HCL 200 MG PO TABS: 100.0000 mg | ORAL_TABLET | Freq: Every day | ORAL | 3 refills | Status: DC

## 2018-03-04 NOTE — Telephone Encounter (Signed)
Pt called to state she had been taking 200mg  bid, she had not tapered down to 200mg  qd. I advised her to still decrease to 1/2 tablet, 100mg , once daily. She will call the office if she begins to feel heart palpitations. She will follow up with Dr Alford Highland office and have LFTs redrawn in 3 months.

## 2018-03-04 NOTE — Telephone Encounter (Signed)
Reviewed labs and recommendations with pt today. Pt understands she should decrease her Amiodarone to 100mg  qd. 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 I advised her to follow up with her PCP to follow up to be sure she has not relapsed.   Pt has agreed to another holter monitor as well.  Pt verbalized understanding and had no additional questions.

## 2018-03-04 NOTE — Telephone Encounter (Signed)
-----   Message from Duke Salvia, MD sent at 03/02/2018  1:37 PM EDT ----- Please Inform Patient that mild abnormality in liver tests is stable but they have become abnormal since starting the amiodarone.  But it raises concerns of amio   Lets plz have her decrease amio to 100 mg daily   Could we also please reorder a holter for PVC s count  6/19 >> 20 % ks

## 2018-03-04 NOTE — Telephone Encounter (Signed)
New message:      Pt is returning a call from earlier and would like to further discuss the call

## 2018-03-06 ENCOUNTER — Other Ambulatory Visit (HOSPITAL_COMMUNITY): Payer: Medicare HMO

## 2018-03-12 ENCOUNTER — Other Ambulatory Visit: Payer: Self-pay | Admitting: Internal Medicine

## 2018-03-19 ENCOUNTER — Ambulatory Visit: Payer: Medicare HMO

## 2018-03-20 ENCOUNTER — Encounter (HOSPITAL_COMMUNITY): Payer: Medicare HMO

## 2018-03-21 ENCOUNTER — Other Ambulatory Visit (HOSPITAL_COMMUNITY): Payer: Self-pay

## 2018-03-21 MED ORDER — APIXABAN 5 MG PO TABS: 5.0000 mg | ORAL_TABLET | Freq: Two times a day (BID) | ORAL | 3 refills | Status: DC

## 2018-03-21 MED ORDER — LOSARTAN POTASSIUM 25 MG PO TABS: 12.5000 mg | ORAL_TABLET | Freq: Every day | ORAL | 3 refills | Status: DC

## 2018-03-21 MED ORDER — EZETIMIBE 10 MG PO TABS: 10.0000 mg | ORAL_TABLET | Freq: Every day | ORAL | 3 refills | Status: DC

## 2018-03-21 MED ORDER — CARVEDILOL 6.25 MG PO TABS: 6.2500 mg | ORAL_TABLET | Freq: Two times a day (BID) | ORAL | 3 refills | Status: DC

## 2018-03-25 ENCOUNTER — Ambulatory Visit (HOSPITAL_COMMUNITY)
Admission: RE | Admit: 2018-03-25 | Discharge: 2018-03-25 | Disposition: A | Discharge status: Home / Self Care | Payer: Medicare HMO | Source: Ambulatory Visit | Attending: Cardiology | Admitting: Cardiology

## 2018-03-25 ENCOUNTER — Other Ambulatory Visit (HOSPITAL_COMMUNITY): Payer: Self-pay

## 2018-03-25 DIAGNOSIS — I5022 Chronic systolic (congestive) heart failure: Secondary | ICD-10-CM | POA: Diagnosis not present

## 2018-03-25 DIAGNOSIS — D594 Other nonautoimmune hemolytic anemias: Secondary | ICD-10-CM | POA: Diagnosis not present

## 2018-03-25 DIAGNOSIS — J189 Pneumonia, unspecified organism: Secondary | ICD-10-CM | POA: Diagnosis not present

## 2018-03-25 DIAGNOSIS — E1122 Type 2 diabetes mellitus with diabetic chronic kidney disease: Secondary | ICD-10-CM | POA: Diagnosis not present

## 2018-03-25 DIAGNOSIS — E7800 Pure hypercholesterolemia, unspecified: Secondary | ICD-10-CM | POA: Diagnosis not present

## 2018-03-25 DIAGNOSIS — E1165 Type 2 diabetes mellitus with hyperglycemia: Secondary | ICD-10-CM | POA: Diagnosis not present

## 2018-03-25 LAB — BASIC METABOLIC PANEL
Anion gap: 6 (ref 5–15)
BUN: 22 mg/dL (ref 8–23)
CHLORIDE: 100 mmol/L (ref 98–111)
CO2: 32 mmol/L (ref 22–32)
CREATININE: 1.34 mg/dL — AB (ref 0.44–1.00)
Calcium: 9.3 mg/dL (ref 8.9–10.3)
GFR calc non Af Amer: 38 mL/min — ABNORMAL LOW (ref >60)
GFR, EST AFRICAN AMERICAN: 44 mL/min — AB (ref >60)
Glucose, Bld: 171 mg/dL — ABNORMAL HIGH (ref 70–99)
Potassium: 4.7 mmol/L (ref 3.5–5.1)
Sodium: 138 mmol/L (ref 135–145)

## 2018-03-25 MED ORDER — AMIODARONE HCL 200 MG PO TABS: 100.0000 mg | ORAL_TABLET | Freq: Every day | ORAL | 3 refills | Status: DC

## 2018-03-27 ENCOUNTER — Other Ambulatory Visit (HOSPITAL_COMMUNITY): Payer: Self-pay

## 2018-03-27 MED ORDER — APIXABAN 5 MG PO TABS: 5.0000 mg | ORAL_TABLET | Freq: Two times a day (BID) | ORAL | 3 refills | Status: DC

## 2018-03-29 ENCOUNTER — Inpatient Hospital Stay (HOSPITAL_COMMUNITY): Admission: RE | Admit: 2018-03-29 | Payer: Medicare HMO | Source: Ambulatory Visit

## 2018-03-29 ENCOUNTER — Encounter: Payer: Self-pay | Admitting: Internal Medicine

## 2018-04-01 DIAGNOSIS — E1165 Type 2 diabetes mellitus with hyperglycemia: Secondary | ICD-10-CM | POA: Diagnosis not present

## 2018-04-01 DIAGNOSIS — J189 Pneumonia, unspecified organism: Secondary | ICD-10-CM | POA: Diagnosis not present

## 2018-04-01 DIAGNOSIS — D594 Other nonautoimmune hemolytic anemias: Secondary | ICD-10-CM | POA: Diagnosis not present

## 2018-04-01 DIAGNOSIS — N189 Chronic kidney disease, unspecified: Secondary | ICD-10-CM | POA: Diagnosis not present

## 2018-04-01 DIAGNOSIS — E1122 Type 2 diabetes mellitus with diabetic chronic kidney disease: Secondary | ICD-10-CM | POA: Diagnosis not present

## 2018-04-18 ENCOUNTER — Other Ambulatory Visit (HOSPITAL_COMMUNITY): Payer: Self-pay

## 2018-04-18 MED ORDER — AMIODARONE HCL 200 MG PO TABS: 100.0000 mg | ORAL_TABLET | Freq: Every day | ORAL | 1 refills | Status: DC

## 2018-04-24 ENCOUNTER — Other Ambulatory Visit (HOSPITAL_COMMUNITY): Payer: Self-pay | Admitting: *Deleted

## 2018-05-02 ENCOUNTER — Other Ambulatory Visit (HOSPITAL_COMMUNITY): Payer: Self-pay

## 2018-05-02 MED ORDER — AMIODARONE HCL 200 MG PO TABS: 100.0000 mg | ORAL_TABLET | Freq: Every day | ORAL | 1 refills | Status: AC

## 2018-05-06 ENCOUNTER — Ambulatory Visit (INDEPENDENT_AMBULATORY_CARE_PROVIDER_SITE_OTHER): Payer: Medicare HMO

## 2018-05-06 ENCOUNTER — Other Ambulatory Visit: Payer: Self-pay | Admitting: Internal Medicine

## 2018-05-06 DIAGNOSIS — I493 Ventricular premature depolarization: Secondary | ICD-10-CM

## 2018-05-06 DIAGNOSIS — R42 Dizziness and giddiness: Secondary | ICD-10-CM | POA: Diagnosis not present

## 2018-05-06 DIAGNOSIS — I255 Ischemic cardiomyopathy: Secondary | ICD-10-CM | POA: Diagnosis not present

## 2018-05-06 DIAGNOSIS — I5022 Chronic systolic (congestive) heart failure: Secondary | ICD-10-CM | POA: Diagnosis not present

## 2018-05-17 ENCOUNTER — Encounter (HOSPITAL_COMMUNITY): Payer: Self-pay | Admitting: Cardiology

## 2018-05-17 ENCOUNTER — Telehealth (HOSPITAL_COMMUNITY): Payer: Self-pay

## 2018-05-17 ENCOUNTER — Ambulatory Visit (HOSPITAL_COMMUNITY)
Admission: RE | Admit: 2018-05-17 | Discharge: 2018-05-17 | Disposition: A | Discharge status: Home / Self Care | Payer: Medicare HMO | Source: Ambulatory Visit | Attending: Cardiology | Admitting: Cardiology

## 2018-05-17 VITALS — BP 112/56 | HR 61 | Wt 133.0 lb

## 2018-05-17 DIAGNOSIS — I5022 Chronic systolic (congestive) heart failure: Secondary | ICD-10-CM

## 2018-05-17 DIAGNOSIS — Z833 Family history of diabetes mellitus: Secondary | ICD-10-CM | POA: Diagnosis not present

## 2018-05-17 DIAGNOSIS — E875 Hyperkalemia: Secondary | ICD-10-CM | POA: Diagnosis not present

## 2018-05-17 DIAGNOSIS — Z0000 Encounter for general adult medical examination without abnormal findings: Secondary | ICD-10-CM | POA: Diagnosis not present

## 2018-05-17 DIAGNOSIS — I130 Hypertensive heart and chronic kidney disease with heart failure and stage 1 through stage 4 chronic kidney disease, or unspecified chronic kidney disease: Secondary | ICD-10-CM | POA: Diagnosis not present

## 2018-05-17 DIAGNOSIS — I493 Ventricular premature depolarization: Secondary | ICD-10-CM | POA: Diagnosis not present

## 2018-05-17 DIAGNOSIS — I25708 Atherosclerosis of coronary artery bypass graft(s), unspecified, with other forms of angina pectoris: Secondary | ICD-10-CM | POA: Diagnosis not present

## 2018-05-17 DIAGNOSIS — I255 Ischemic cardiomyopathy: Secondary | ICD-10-CM | POA: Diagnosis not present

## 2018-05-17 DIAGNOSIS — I252 Old myocardial infarction: Secondary | ICD-10-CM | POA: Diagnosis not present

## 2018-05-17 DIAGNOSIS — Z7901 Long term (current) use of anticoagulants: Secondary | ICD-10-CM | POA: Insufficient documentation

## 2018-05-17 DIAGNOSIS — I480 Paroxysmal atrial fibrillation: Secondary | ICD-10-CM | POA: Insufficient documentation

## 2018-05-17 DIAGNOSIS — Z79899 Other long term (current) drug therapy: Secondary | ICD-10-CM | POA: Diagnosis not present

## 2018-05-17 DIAGNOSIS — E1122 Type 2 diabetes mellitus with diabetic chronic kidney disease: Secondary | ICD-10-CM | POA: Insufficient documentation

## 2018-05-17 DIAGNOSIS — I2510 Atherosclerotic heart disease of native coronary artery without angina pectoris: Secondary | ICD-10-CM | POA: Diagnosis not present

## 2018-05-17 DIAGNOSIS — Z7902 Long term (current) use of antithrombotics/antiplatelets: Secondary | ICD-10-CM | POA: Diagnosis not present

## 2018-05-17 DIAGNOSIS — Z23 Encounter for immunization: Secondary | ICD-10-CM | POA: Diagnosis not present

## 2018-05-17 DIAGNOSIS — Z794 Long term (current) use of insulin: Secondary | ICD-10-CM | POA: Insufficient documentation

## 2018-05-17 DIAGNOSIS — N183 Chronic kidney disease, stage 3 (moderate): Secondary | ICD-10-CM | POA: Insufficient documentation

## 2018-05-17 DIAGNOSIS — E785 Hyperlipidemia, unspecified: Secondary | ICD-10-CM | POA: Diagnosis not present

## 2018-05-17 DIAGNOSIS — I700 Atherosclerosis of aorta: Secondary | ICD-10-CM | POA: Diagnosis not present

## 2018-05-17 DIAGNOSIS — Z7982 Long term (current) use of aspirin: Secondary | ICD-10-CM | POA: Diagnosis not present

## 2018-05-17 DIAGNOSIS — Z951 Presence of aortocoronary bypass graft: Secondary | ICD-10-CM | POA: Insufficient documentation

## 2018-05-17 DIAGNOSIS — Z8249 Family history of ischemic heart disease and other diseases of the circulatory system: Secondary | ICD-10-CM | POA: Diagnosis not present

## 2018-05-17 DIAGNOSIS — E039 Hypothyroidism, unspecified: Secondary | ICD-10-CM | POA: Diagnosis not present

## 2018-05-17 DIAGNOSIS — Z79890 Hormone replacement therapy (postmenopausal): Secondary | ICD-10-CM | POA: Diagnosis not present

## 2018-05-17 DIAGNOSIS — E1165 Type 2 diabetes mellitus with hyperglycemia: Secondary | ICD-10-CM | POA: Diagnosis not present

## 2018-05-17 LAB — COMPREHENSIVE METABOLIC PANEL
ALT: 206 U/L — ABNORMAL HIGH (ref 0–44)
ANION GAP: 8 (ref 5–15)
AST: 320 U/L — ABNORMAL HIGH (ref 15–41)
Albumin: 3.8 g/dL (ref 3.5–5.0)
Alkaline Phosphatase: 101 U/L (ref 38–126)
BUN: 25 mg/dL — ABNORMAL HIGH (ref 8–23)
CO2: 29 mmol/L (ref 22–32)
Calcium: 9.3 mg/dL (ref 8.9–10.3)
Chloride: 100 mmol/L (ref 98–111)
Creatinine, Ser: 1.35 mg/dL — ABNORMAL HIGH (ref 0.44–1.00)
GFR calc Af Amer: 45 mL/min — ABNORMAL LOW (ref >60)
GFR calc non Af Amer: 39 mL/min — ABNORMAL LOW (ref >60)
Glucose, Bld: 129 mg/dL — ABNORMAL HIGH (ref 70–99)
Potassium: 4.1 mmol/L (ref 3.5–5.1)
Sodium: 137 mmol/L (ref 135–145)
Total Bilirubin: 0.6 mg/dL (ref 0.3–1.2)
Total Protein: 7.1 g/dL (ref 6.5–8.1)

## 2018-05-17 LAB — TSH: TSH: 6.517 u[IU]/mL — ABNORMAL HIGH (ref 0.350–4.500)

## 2018-05-17 LAB — LIPID PANEL
CHOLESTEROL: 118 mg/dL (ref 0–200)
HDL: 54 mg/dL (ref >40)
LDL Cholesterol: 46 mg/dL (ref 0–99)
Total CHOL/HDL Ratio: 2.2 RATIO
Triglycerides: 88 mg/dL (ref <150)
VLDL: 18 mg/dL (ref 0–40)

## 2018-05-17 MED ORDER — LOSARTAN POTASSIUM 25 MG PO TABS: 25.0000 mg | ORAL_TABLET | Freq: Every evening | ORAL | 3 refills | Status: DC

## 2018-05-17 NOTE — Patient Instructions (Signed)
Labs done today  INCREASE Losaratan 25mg  (1 tab) every evening  Please follow up with our heart failure pharmacist in 3 weeks  Follow up with Dr. Shirlee Latch in 6 weeks

## 2018-05-17 NOTE — Telephone Encounter (Signed)
Pt called message left for her to call back

## 2018-05-19 NOTE — Progress Notes (Signed)
Advanced Heart Failure Clinic Note  PCP: Georgianne Fick, MD Cardiology: Dr. Rennis Golden HF Cardiology: Dr. Shirlee Latch  73 yo with history of CAD and ischemic cardiomyopathy presents for followup of CAD and CHF.  She had CABG x 4 in 2008.  Echo in 3/13 was 40-45%.  By 10/18, EF had dropped to 20% by echo.    She was admitted in 1/19 with CHF exacerbation/NSTEMI.  LHC showed occluded SVG-PDA and serial 70% and 90% stenoses in the proximal to mid RCA.  She had atherectomy with DES x 2 to RCA.  Echo in 1/19 with EF 20-25%.  She was diuresed and had left thoracentesis for pleural effusion.   She developed lightheadedness/orthostasis and AKI with hyperkalemia. Entresto and spironolactone were stopped.   In 5/19, Medtronic ICD was placed.   She presents today for followup of CHF.  She was started on amiodarone by Dr Graciela Husbands for frequent PVCs that may be contributing to cardiomyopathy. She just wore a repeat holter for Dr. Graciela Husbands to reassess PVC percentage on amiodarone.  LFTs in 10/19 were mildly elevated, amiodarone was cut back to 100 mg daily.  No significant exertional dyspnea.  She uses a walker or cane for balance and has had falls.  No orthopnea/PND.  No lightheadedness.   Labs (1/19): K 3.9, creatinine 0.88 Labs (4/19): K 5.1, Creatinine 1.31 Labs (5/19): K 5.8 => 5.5, creatinine 1.21 Labs (10/19): AST 50, ALT 37, TSH normal Labs (11/19): K 4.7, creatinine 1.34  Medtronic device interrogation: Thoracic impedance stable, no AF/VT.   PMH: 1. CAD: s/p CABG x 4 in 2008.  - NSTEMI 1/19 with LHC showing patent LIMA-LAD, 80% proximal LAD, 70% proximal RCA, 90% mid RCA, SVG-ramus patent, SVG-OM patent, SVG-PDA occluded.  Patient had atherectomy with DES x 2 to prox and mid RCA.   2. Chronic systolic CHF: Ischemic cardiomyopathy.  Medtronic ICD.  - Echo (3/13): EF 45-50%.  - Echo (10/18): EF 20%.  - Echo (1/19): EF 20-25%, diffuse hypokinesis, mild LV dilation.  - Echo (4/19): EF 25-30%.  3.  HTN 4. H/o left pleural effusion 5. Hyperlipidemia 6. Type II diabetes 7. Atrial fibrillation: Paroxysmal, noted only post-op CABG.  8. Hypothyroidism 9. Depression 10. Hyperkalemia with spironolactone.  11. PAD: ABIs 5/19 with normal ABI on right, left noncompressible.  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   Review of systems complete and found to be negative unless listed in HPI.   Social History   Socioeconomic History  . Marital status: Married    Spouse name: Not on file  . Number of children: 1  . Years of education: 49  . Highest education level: Not on file  Occupational History    Employer: [employer]  Social Needs  . Financial resource strain: Not hard at all  . Food insecurity:    Worry: Never true    Inability: Never true  . Transportation needs:    Medical: Yes    Non-medical: Yes  Tobacco Use  . Smoking status: Never Smoker  . Smokeless tobacco: Never Used  Substance and Sexual Activity  . Alcohol use: No  . Drug use: No  . Sexual activity: Never    Birth control/protection: Abstinence  Lifestyle  . Physical activity:    Days per week: Not on file    Minutes per session: Not on file  . Stress: Not on file  Relationships  . Social connections:    Talks on phone: Not on file    Gets  together: Not on file    Attends religious service: Not on file    Active member of club or organization: Not on file    Attends meetings of clubs or organizations: Not on file    Relationship status: Not on file  . Intimate partner violence:    Fear of current or ex partner: Not on file    Emotionally abused: Not on file    Physically abused: Not on file    Forced sexual activity: Not on file  Other Topics Concern  . Not on file  Social History Narrative  . Not on file   Family History  Problem Relation Age of Onset  . Diabetes Mother [id number]  . Heart disease Mother   . Hyperlipidemia Mother   . Cancer Father   . Heart attack Father [id number]  .  Cancer Maternal Grandmother   . Cancer Paternal Grandmother   . Thyroid disease Sister   . Diabetes Child     Current Outpatient Medications  Medication Sig Dispense Refill  . amiodarone (PACERONE) 200 MG tablet Take 0.5 tablets (100 mg total) by mouth daily. 90 tablet 1  . apixaban (ELIQUIS) 5 MG TABS tablet Take 1 tablet (5 mg total) by mouth 2 (two) times daily. 180 tablet 3  . carvedilol (COREG) 6.25 MG tablet Take 1 tablet (6.25 mg total) by mouth 2 (two) times daily with a meal. 180 tablet 3  . cholecalciferol (VITAMIN D) 1000 UNITS tablet Take 1,000 Units by mouth 2 (two) times daily.     . clopidogrel (PLAVIX) 75 MG tablet Take 1 tablet (75 mg total) by mouth daily with breakfast. 90 tablet 3  . Cyanocobalamin (VITAMIN B-12 PO) Take by mouth daily.    Marland Kitchen. ezetimibe (ZETIA) 10 MG tablet Take 1 tablet (10 mg total) by mouth daily. 90 tablet 3  . feeding supplement, ENSURE ENLIVE, (ENSURE ENLIVE) LIQD Take 237 mLs by mouth daily.    . ferrous sulfate 325 (65 FE) MG EC tablet Take 325 mg by mouth daily.    . insulin aspart (NOVOLOG) 100 UNIT/ML injection Inject 0-15 Units into the skin 3 (three) times daily with meals. 10 mL 11  . insulin glargine (LANTUS) 100 UNIT/ML injection Inject 0.06 mLs (6 Units total) into the skin at bedtime. 10 mL 11  . levothyroxine (SYNTHROID, LEVOTHROID) 50 MCG tablet Take 1 tablet (50 mcg total) by mouth daily. 90 tablet 3  . losartan (COZAAR) 25 MG tablet Take 1 tablet (25 mg total) by mouth every evening. 45 tablet 3  . simvastatin (ZOCOR) 40 MG tablet Take 1 tablet (40 mg total) by mouth at bedtime. 90 tablet 3  . torsemide (DEMADEX) 20 MG tablet Take 2 tablets (40 mg total) by mouth daily. 180 tablet 3  . venlafaxine XR (EFFEXOR-XR) 150 MG 24 hr capsule TAKE 1 CAPSULE EVERY DAY 90 capsule 3  . vitamin C (ASCORBIC ACID) 500 MG tablet Take 500 mg by mouth daily.    . potassium chloride SA (K-DUR,KLOR-CON) 20 MEQ tablet Take 1 tablet (20 mEq total) by mouth  daily. (Patient not taking: Reported on 05/17/2018) 90 tablet 3   No current facility-administered medications for this encounter.    Vitals:   05/17/18 1042 05/17/18 1135 05/17/18 1136  BP: 114/60 (!) 112/58 (!) 112/56  Pulse: 61    SpO2: 97%    Weight: 60.3 kg (133 lb)     Wt Readings from Last 3 Encounters:  05/17/18 60.3 kg (133 lb)  02/27/18  59.6 kg (131 lb 6.4 oz)  02/27/18 59.8 kg (131 lb 12.8 oz)    General: NAD Neck: No JVD, no thyromegaly or thyroid nodule.  Lungs: Clear to auscultation bilaterally with normal respiratory effort. CV: Nondisplaced PMI.  Heart regular S1/S2, no S3/S4, no murmur.  No peripheral edema.  No carotid bruit.  Normal pedal pulses.  Abdomen: Soft, nontender, no hepatosplenomegaly, no distention.  Skin: Intact without lesions or rashes.  Neurologic: Alert and oriented x 3.  Psych: Normal affect. Extremities: No clubbing or cyanosis.  HEENT: Normal.   Assessment/Plan: 1. Chronic systolic CHF: Ischemic cardiomyopathy.  Echo 1/19 with EF 20-25%.  Echo 09/03/17 with EF 25-30%. Medtronic ICD.  She is not volume overloaded on exam.  NYHA class II symptoms. She is off spironolactone due to hyperkalemia and off Entresto due to orthostatic hypotension. She was not orthostatic when we checked in the office today.  - Continue torsemide 20 mg daily. BMET today.   - Continue Coreg 6.25 mg bid.  - Increase losartan to 25 mg qhs (take before bed to limit orthostatic symptoms).  2. CAD: No chest pain.  - Continue Plavix, ASA 81 daily. Should continue Plavix long-term.  - Continue simvastatin 40 daily with Zetia 10 daily. Check lipids today.  3. Diabetes: Next step will be to add dapagliflozin to her regimen, mainly for its cardiac benefit.  4. Atrial fibrillation: Paroxysmal, noted post-op CABG.  Will need anticoagulation if it recurs.  5. Orthostatic hypotension/dizziness: Resolved after cutting back meds.  Now primarily has problems with balance and not  orthostasis.   6. PVCs: 20% PVCs on 6/19 holter.  ?If this could be contributing to cardiomyopathy.  She is now on amiodarone, this was cut back to 100 mg daily with elevated LFTs.  - Repeat holter monitor pending to see if PVC percentage has come down.  - With amiodarone use, need to check LFTs and TSH today. She will need a regular eye exam.  7. CKD: Stage 3.  She has had hyperkalemia in the past.   - BMET today.   Followup in 3 wks in CHF pharmacy clinic, would like to start dapagliflozin at that time.  See me in 6 wks.   Marca Ancona, MD  05/19/2018

## 2018-05-24 ENCOUNTER — Other Ambulatory Visit (HOSPITAL_COMMUNITY): Payer: Self-pay

## 2018-05-24 MED ORDER — LOSARTAN POTASSIUM 25 MG PO TABS: 25.0000 mg | ORAL_TABLET | Freq: Every evening | ORAL | 3 refills | Status: DC

## 2018-05-29 ENCOUNTER — Ambulatory Visit (INDEPENDENT_AMBULATORY_CARE_PROVIDER_SITE_OTHER): Payer: Medicare HMO

## 2018-05-29 DIAGNOSIS — I255 Ischemic cardiomyopathy: Secondary | ICD-10-CM

## 2018-05-30 LAB — CUP PACEART REMOTE DEVICE CHECK
Battery Remaining Longevity: 128 mo
Battery Voltage: 3.03 V
Brady Statistic RV Percent Paced: 0.01 %
Date Time Interrogation Session: 20200122072703
HighPow Impedance: 61 Ohm
Implantable Lead Location: 753860
Lead Channel Impedance Value: 342 Ohm
Lead Channel Impedance Value: 437 Ohm
Lead Channel Pacing Threshold Amplitude: 0.75 V
Lead Channel Pacing Threshold Pulse Width: 0.4 ms
Lead Channel Sensing Intrinsic Amplitude: 7.75 mV
Lead Channel Sensing Intrinsic Amplitude: 7.75 mV
Lead Channel Setting Pacing Amplitude: 2.5 V
Lead Channel Setting Pacing Pulse Width: 0.4 ms
Lead Channel Setting Sensing Sensitivity: 0.3 mV
MDC IDC LEAD IMPLANT DT: 20190531
MDC IDC PG IMPLANT DT: 20190531

## 2018-05-30 NOTE — Progress Notes (Signed)
Remote ICD transmission.   

## 2018-05-31 ENCOUNTER — Encounter: Payer: Self-pay | Admitting: Cardiology

## 2018-05-31 ENCOUNTER — Telehealth (HOSPITAL_COMMUNITY): Payer: Self-pay

## 2018-05-31 ENCOUNTER — Ambulatory Visit (HOSPITAL_COMMUNITY)
Admission: RE | Admit: 2018-05-31 | Discharge: 2018-05-31 | Disposition: A | Discharge status: Home / Self Care | Payer: Medicare HMO | Source: Ambulatory Visit | Attending: Cardiology | Admitting: Cardiology

## 2018-05-31 DIAGNOSIS — I5022 Chronic systolic (congestive) heart failure: Secondary | ICD-10-CM

## 2018-05-31 LAB — COMPREHENSIVE METABOLIC PANEL
ALT: 38 U/L (ref 0–44)
AST: 34 U/L (ref 15–41)
Albumin: 3.7 g/dL (ref 3.5–5.0)
Alkaline Phosphatase: 83 U/L (ref 38–126)
Anion gap: 8 (ref 5–15)
BUN: 35 mg/dL — ABNORMAL HIGH (ref 8–23)
CO2: 31 mmol/L (ref 22–32)
Calcium: 10 mg/dL (ref 8.9–10.3)
Chloride: 99 mmol/L (ref 98–111)
Creatinine, Ser: 1.58 mg/dL — ABNORMAL HIGH (ref 0.44–1.00)
GFR calc non Af Amer: 32 mL/min — ABNORMAL LOW (ref >60)
GFR, EST AFRICAN AMERICAN: 37 mL/min — AB (ref >60)
Glucose, Bld: 139 mg/dL — ABNORMAL HIGH (ref 70–99)
Potassium: 4.7 mmol/L (ref 3.5–5.1)
SODIUM: 138 mmol/L (ref 135–145)
Total Bilirubin: 0.6 mg/dL (ref 0.3–1.2)
Total Protein: 7.5 g/dL (ref 6.5–8.1)

## 2018-05-31 MED ORDER — TORSEMIDE 20 MG PO TABS: 20.0000 mg | ORAL_TABLET | ORAL | 3 refills | Status: DC

## 2018-05-31 NOTE — Telephone Encounter (Signed)
Pt called given lab results and medication change verbalized understanding, lab appointment made LFTs are back to normal off amiodarone.  Will talk with Dr Graciela Husbands about further steps for PVCs.  Creatinine up a bit, decrease torsemide to 20 mg every other day, BMET 2 wks.

## 2018-06-09 NOTE — Progress Notes (Signed)
PCP: Georgianne Fick, MD Cardiology: Dr. Rennis Golden HF Cardiology: Dr. Shirlee Latch  HPI: 73 yo with history of CAD and ischemic cardiomyopathy presents for followup of CAD and CHF.  She had CABG x 4 in 2008.  Echo in 3/13 was 40-45%.  By 10/18, EF had dropped to 20% by echo.    She was admitted in 1/19 with CHF exacerbation/NSTEMI.  LHC showed occluded SVG-PDA and serial 70% and 90% stenoses in the proximal to mid RCA.  She had atherectomy with DES x 2 to RCA. Echo in 1/19 with EF 20-25%. She developed lightheadedness/orthostasis and AKI with hyperkalemia. Entresto and spironolactone were stopped. In 5/19, Medtronic ICD was placed. She wore a repeat holter for Dr. Graciela Husbands to reassess PVC percentage on amiodarone.  LFTs in 10/19 were mildly elevated, amiodarone was cut back to 100 mg daily.  She presents today for pharmacist-led HF medication titration. At last visit with Dr. Shirlee Latch on  05/17/2018, her losartan was increased to 25 mg daily. Amiodarone was discontinued due to elevated LFTs on 05/17/2018. She presents today in good spirits. She feels off-balance, uses a cane for balance but got a new three-wheel walker to start walking more. Is happy with her energy levels, she was able to get everything done around the holidays that she wanted to do. No significant exertional dyspnea.  No orthopnea/PND.  No lightheadedness.      . Shortness of breath/dyspnea on exertion? no  . Orthopnea/PND? no . Edema? no . Lightheadedness/dizziness? no . Daily weights at home? Yes . Blood pressure/heart rate monitoring at home? No- [facility], per recall last BP 112/60 mmHg . Following low-sodium/fluid-restricted diet? yes  HF Medications: Carvedilol 6.25 mg twice daily Losartan 25 mg every evening Torsemide 20 mg every other day  Potassium 20 mEq daily- has difficulty swallowing, has not taken since Friday    Has the patient been experiencing any side effects to the medications prescribed?  no Does the  patient have any problems obtaining medications due to transportation or finances?   no  Understanding of regimen: good Understanding of indications: good Potential of compliance: good Patient understands to avoid NSAIDs. Patient understands to avoid decongestants.    Pertinent Lab Values: . 06/10/2018: Scr 1.35, K 5.1  . 05/31/2018: Scr 1.58, K 4.7, AST 34, ALT 38  . 05/17/2018: Scr 1.35, K 4.1, AST 320, ALT 206 . 03/25/18: Scr 1.34, K 4.7  Vital Signs: . Weight: 137.8 lb (dry weight: 135 lb, previously reported 132 lb) . Blood pressure: 142/70 . Heart rate: 63 bpm   Assessment: 1. Chronic systolic CHF (EF 97-58%, 08/2017), due to ischemic cardiomyopathy. NYHA class II symptoms. - Volume status neutral on exam, swelling noted in right ankle/foot (pt dropped something on her foot) - She is off spironolactone due to hyperkalemia and off Entresto due to orthostatic hypotension. She was not orthostatic when checked in the office previously.  - Continue torsemide 20 mg every other day. BMET today, Scr stable   - Continue Coreg 6.25 mg bid.  - Continue losartan to 25 mg qhs (take before bed to limit orthostatic symptoms). BMET today, K 5.1. Patient has been taking her potassium intermittently due to difficulty swallowing pill. Will hold potassium until next week, and recheck BMET prior to increasing losartan for better blood pressure control. Patient encouraged to start checking blood pressure at home  - Basic disease state pathophysiology, medication indication, mechanism and side effects reviewed at length with patient and he verbalized understanding  2. CAD:  No chest pain.   - Per patient, clopidogrel has been discontinued. DES placed 05/17/17, has completed one year of therapy.  - Continue simvastatin 40 daily with Zetia 10 daily. Total cholesterol 118, LDL 46, TG 88, HDL 54  (05/17/2018)   3. Diabetes: Next step will be to add dapagliflozin to her regimen, mainly for its cardiac benefit.    4. Atrial fibrillation: Paroxysmal, noted post-op CABG.   - Medtronic previously Interrogated (9/11)- Looks like she has been in A fib 40% but with addition of amiodarone A fib reduced to 10%.  - Continue on apixaban.    5. Orthostatic hypotension/dizziness: Resolved after cutting back meds.  Now primarily has problems with balance and not orthostasis.    6. PVCs: 20% PVCs on 6/19 holter.  ?If this could be contributing to cardiomyopathy.  She was on amiodarone, this was cut back to 100 mg daily with elevated LFTs. LFTs again elevated 1/10 on amiodarone (AST 320, ALT 206), amiodarone discontinued and LFTs normalized (AST 34/ALT 38) two weeks later. TSH 1/10 6.517.  - Confirmed patient has removed amiodarone from pill box.  7. CKD: Stage 3.  She has had hyperkalemia in the past.   - Scr 1.35, K 5.1, 06/10/2018   Plan: 1) Medication changes: Based on clinical presentation, vital signs and recent labs will hold potassium until next week, and recheck BMET prior to increasing losartan for better blood pressure control. 2) Labs: Today and one week 3) Follow-up: Dr. Shirlee Latch 07/26/2018  Marcelino Freestone, PharmD PGY2 Cardiology Pharmacy Resident 06/10/2018 6:05 PM

## 2018-06-10 ENCOUNTER — Ambulatory Visit (HOSPITAL_COMMUNITY)
Admission: RE | Admit: 2018-06-10 | Discharge: 2018-06-10 | Disposition: A | Discharge status: Home / Self Care | Payer: Medicare HMO | Source: Ambulatory Visit | Attending: Internal Medicine | Admitting: Internal Medicine

## 2018-06-10 VITALS — BP 142/70 | HR 63 | Wt 137.8 lb

## 2018-06-10 DIAGNOSIS — I252 Old myocardial infarction: Secondary | ICD-10-CM | POA: Diagnosis not present

## 2018-06-10 DIAGNOSIS — Z951 Presence of aortocoronary bypass graft: Secondary | ICD-10-CM | POA: Diagnosis not present

## 2018-06-10 DIAGNOSIS — I2510 Atherosclerotic heart disease of native coronary artery without angina pectoris: Secondary | ICD-10-CM | POA: Insufficient documentation

## 2018-06-10 DIAGNOSIS — N183 Chronic kidney disease, stage 3 (moderate): Secondary | ICD-10-CM | POA: Insufficient documentation

## 2018-06-10 DIAGNOSIS — I493 Ventricular premature depolarization: Secondary | ICD-10-CM | POA: Diagnosis not present

## 2018-06-10 DIAGNOSIS — I5022 Chronic systolic (congestive) heart failure: Secondary | ICD-10-CM | POA: Insufficient documentation

## 2018-06-10 DIAGNOSIS — I480 Paroxysmal atrial fibrillation: Secondary | ICD-10-CM | POA: Insufficient documentation

## 2018-06-10 DIAGNOSIS — Z79899 Other long term (current) drug therapy: Secondary | ICD-10-CM | POA: Diagnosis not present

## 2018-06-10 DIAGNOSIS — I255 Ischemic cardiomyopathy: Secondary | ICD-10-CM | POA: Diagnosis not present

## 2018-06-10 DIAGNOSIS — E119 Type 2 diabetes mellitus without complications: Secondary | ICD-10-CM | POA: Insufficient documentation

## 2018-06-10 LAB — BASIC METABOLIC PANEL
Anion gap: 10 (ref 5–15)
BUN: 27 mg/dL — ABNORMAL HIGH (ref 8–23)
CO2: 27 mmol/L (ref 22–32)
CREATININE: 1.35 mg/dL — AB (ref 0.44–1.00)
Calcium: 9.5 mg/dL (ref 8.9–10.3)
Chloride: 102 mmol/L (ref 98–111)
GFR calc Af Amer: 45 mL/min — ABNORMAL LOW (ref >60)
GFR calc non Af Amer: 39 mL/min — ABNORMAL LOW (ref >60)
Glucose, Bld: 239 mg/dL — ABNORMAL HIGH (ref 70–99)
Potassium: 5.1 mmol/L (ref 3.5–5.1)
Sodium: 139 mmol/L (ref 135–145)

## 2018-06-10 NOTE — Patient Instructions (Addendum)
It was nice meeting you today in the Heart failure clinic at [facility]    Medication changes: Depending on your lab results, we may add spironolactone 12.5 mg daily back. If we do this, we will stop your potassium supplement.   You had Lab work done today:   Follow up with Dr Shirlee Latch in 07/26/2018   Do the following things EVERYDAY: 1. Weigh yourself in the morning before breakfast. Write it down and keep it in a log. 2. Take your medicines as prescribed 3. Eat low salt foods-Limit salt (sodium) to 2000 mg per day.  4. Stay as active as you can everyday 5. Limit all fluids for the day to less than 2 liters 6. Start checking your blood pressure every day and keep a log.

## 2018-06-13 ENCOUNTER — Other Ambulatory Visit (HOSPITAL_COMMUNITY): Payer: Medicare HMO

## 2018-06-19 ENCOUNTER — Other Ambulatory Visit (HOSPITAL_COMMUNITY): Payer: Medicare HMO

## 2018-06-19 ENCOUNTER — Other Ambulatory Visit: Payer: Self-pay | Admitting: Internal Medicine

## 2018-06-28 ENCOUNTER — Other Ambulatory Visit (HOSPITAL_COMMUNITY): Payer: Medicare HMO

## 2018-07-01 ENCOUNTER — Ambulatory Visit (HOSPITAL_COMMUNITY)
Admission: RE | Admit: 2018-07-01 | Discharge: 2018-07-01 | Disposition: A | Discharge status: Home / Self Care | Payer: Medicare HMO | Source: Ambulatory Visit | Attending: Cardiology | Admitting: Cardiology

## 2018-07-01 DIAGNOSIS — I5022 Chronic systolic (congestive) heart failure: Secondary | ICD-10-CM | POA: Insufficient documentation

## 2018-07-01 LAB — BASIC METABOLIC PANEL
ANION GAP: 14 (ref 5–15)
BUN: 31 mg/dL — ABNORMAL HIGH (ref 8–23)
CO2: 24 mmol/L (ref 22–32)
Calcium: 9.4 mg/dL (ref 8.9–10.3)
Chloride: 101 mmol/L (ref 98–111)
Creatinine, Ser: 1.39 mg/dL — ABNORMAL HIGH (ref 0.44–1.00)
GFR calc Af Amer: 43 mL/min — ABNORMAL LOW (ref >60)
GFR calc non Af Amer: 37 mL/min — ABNORMAL LOW (ref >60)
Glucose, Bld: 172 mg/dL — ABNORMAL HIGH (ref 70–99)
Potassium: 4.6 mmol/L (ref 3.5–5.1)
Sodium: 139 mmol/L (ref 135–145)

## 2018-07-11 ENCOUNTER — Other Ambulatory Visit: Payer: Self-pay | Admitting: Cardiology

## 2018-07-26 ENCOUNTER — Encounter (HOSPITAL_COMMUNITY): Payer: Medicare HMO | Admitting: Cardiology

## 2018-08-12 DIAGNOSIS — N390 Urinary tract infection, site not specified: Secondary | ICD-10-CM | POA: Diagnosis not present

## 2018-08-28 ENCOUNTER — Other Ambulatory Visit: Payer: Self-pay

## 2018-08-28 ENCOUNTER — Ambulatory Visit (INDEPENDENT_AMBULATORY_CARE_PROVIDER_SITE_OTHER): Payer: Medicare HMO | Admitting: *Deleted

## 2018-08-28 DIAGNOSIS — I255 Ischemic cardiomyopathy: Secondary | ICD-10-CM | POA: Diagnosis not present

## 2018-08-28 LAB — CUP PACEART REMOTE DEVICE CHECK
Battery Remaining Longevity: 126 mo
Battery Voltage: 3.02 V
Brady Statistic RV Percent Paced: 0.01 %
Date Time Interrogation Session: 20200422083823
HighPow Impedance: 63 Ohm
Implantable Lead Implant Date: 20190531
Implantable Lead Location: 753860
Implantable Pulse Generator Implant Date: 20190531
Lead Channel Impedance Value: 342 Ohm
Lead Channel Impedance Value: 399 Ohm
Lead Channel Pacing Threshold Amplitude: 1 V
Lead Channel Pacing Threshold Pulse Width: 0.4 ms
Lead Channel Sensing Intrinsic Amplitude: 7.25 mV
Lead Channel Sensing Intrinsic Amplitude: 7.25 mV
Lead Channel Setting Pacing Amplitude: 2.5 V
Lead Channel Setting Pacing Pulse Width: 0.4 ms
Lead Channel Setting Sensing Sensitivity: 0.3 mV

## 2018-09-05 ENCOUNTER — Encounter: Payer: Self-pay | Admitting: Cardiology

## 2018-09-05 NOTE — Progress Notes (Signed)
Remote ICD transmission.   

## 2018-09-09 ENCOUNTER — Telehealth: Payer: Self-pay | Admitting: Cardiology

## 2018-09-09 ENCOUNTER — Encounter (HOSPITAL_COMMUNITY): Payer: Self-pay

## 2018-09-09 ENCOUNTER — Other Ambulatory Visit: Payer: Self-pay

## 2018-09-09 ENCOUNTER — Ambulatory Visit (HOSPITAL_COMMUNITY)
Admission: RE | Admit: 2018-09-09 | Discharge: 2018-09-09 | Disposition: A | Discharge status: Home / Self Care | Payer: Medicare HMO | Source: Ambulatory Visit | Attending: Cardiology | Admitting: Cardiology

## 2018-09-09 VITALS — Wt 129.0 lb

## 2018-09-09 DIAGNOSIS — I5022 Chronic systolic (congestive) heart failure: Secondary | ICD-10-CM

## 2018-09-09 DIAGNOSIS — I493 Ventricular premature depolarization: Secondary | ICD-10-CM

## 2018-09-09 DIAGNOSIS — I255 Ischemic cardiomyopathy: Secondary | ICD-10-CM

## 2018-09-09 DIAGNOSIS — N183 Chronic kidney disease, stage 3 unspecified: Secondary | ICD-10-CM

## 2018-09-09 DIAGNOSIS — I480 Paroxysmal atrial fibrillation: Secondary | ICD-10-CM

## 2018-09-09 NOTE — Telephone Encounter (Signed)
Spoke w/ pt and gave her the results from her last remote transmission. Pt verbalized understanding.

## 2018-09-09 NOTE — Addendum Note (Signed)
Encounter addended by: Marisa Hua, RN on: 09/09/2018 2:45 PM  Actions taken: Order list changed, Diagnosis association updated, Clinical Note Signed

## 2018-09-09 NOTE — Patient Instructions (Addendum)
Labs May7th 10:45am We will only contact you if something comes back abnormal or we need to make some changes. Otherwise no news is good news!   Follow up in 3 months with [facility]

## 2018-09-09 NOTE — Telephone Encounter (Signed)
-----   Message from Karie Soda, RN sent at 09/09/2018  3:25 PM EDT ----- Regarding: FW: interrogation I am forwarding this message since patient is not in ICM clinic.   ----- Message ----- From: Sherald Hess, NP Sent: 09/09/2018   1:58 PM EDT To: Karie Soda, RN Subject: interrogation                                  She was asking about her transmission. She hasnt heard anything.   Thks Amy

## 2018-09-09 NOTE — Progress Notes (Signed)
Called patient, dicussed AVS.  Pt scheduled for lab appt for this Thursday.  Pt appreciative

## 2018-09-09 NOTE — Progress Notes (Signed)
Heart Failure TeleHealth Note  Due to national recommendations of social distancing due to COVID 19, Audio/video telehealth visit is felt to be most appropriate for this patient at this time.  See MyChart message from today for patient consent regarding telehealth for [facility].  Date:  09/09/2018   ID:  [name], DOB 13-May-1945, MRN [id number]  Location: Home  Provider location: [place] [facility] Type of Visit: Established patient   PCP:  Georgianne Fick, MD  Cardiologist:  Chrystie Nose, MD Primary HF:  Dr Shirlee Latch   Chief Complaint: Heart Failure   History of Present Illness: [name] is a 73 y.o. female with a history of CAD and ischemic cardiomyopathy presents for followup of CAD and CHF.  She had CABG x 4 in 2008.  Echo in 3/13 was 40-45%.  By 10/18, EF had dropped to 20% by echo.    She was admitted in 1/19 with CHF exacerbation/NSTEMI.  LHC showed occluded SVG-PDA and serial 70% and 90% stenoses in the proximal to mid RCA.  She had atherectomy with DES x 2 to RCA.  Echo in 1/19 with EF 20-25%.  She was diuresed and had left thoracentesis for pleural effusion.   She developed lightheadedness/orthostasis and AKI with hyperkalemia. Entresto and spironolactone were stopped.   In 5/19, Medtronic ICD was placed.   She  presents via Special educational needs teacher for a telehealth visit today.  Overall feeling fine. Denies SOB/PND/Orthopnea. Denies chest pain. No bleeding issues. Appetite ok. No fever or chills. Weight at home 129  pounds. Taking all medications. Lives with her husband. She cares for her debilitated husband.    she denies symptoms worrisome for COVID 19.   Past Medical History:  Diagnosis Date  . Cholelithiases   . Coronary artery disease 02/2007   CABG  . Diabetes (HCC)   . Dyslipidemia   . HTN (hypertension)   . Hypothyroid   . PAF (paroxysmal atrial fibrillation) (HCC)    post op  . S/P CABG x 4 2008   Past Surgical  History:  Procedure Laterality Date  . ABDOMINAL HYSTERECTOMY    . CARDIAC CATHETERIZATION  03/01/2007   LAD with 99% prox lesion, at birfucation of Cfx there is 99% lesion, at bifurcation of large OM1 there is 99% lesion, 70% prox lesion at ramus intermedius, 90% lesion in prox RCA (Dr. Claudia Desanctis) - susequent CABG  . CORONARY ARTERY BYPASS GRAFT  03/05/2007   LIMA-LAD, SVG-ramus intermediate. SVG-OM, SVG-PDA (Dr. Donata Clay)  . CORONARY ATHERECTOMY N/A 05/17/2017   Procedure: CORONARY ATHERECTOMY;  Surgeon: Lennette Bihari, MD;  Location: [facility] INVASIVE CV LAB;  Service: Cardiovascular;  Laterality: N/A;  . CORONARY STENT INTERVENTION N/A 05/17/2017   Procedure: CORONARY STENT INTERVENTION;  Surgeon: Lennette Bihari, MD;  Location: [facility] INVASIVE CV LAB;  Service: Cardiovascular;  Laterality: N/A;  . ICD IMPLANT N/A 10/05/2017   Procedure: ICD IMPLANT;  Surgeon: Duke Salvia, MD;  Location: [facility] INVASIVE CV LAB;  Service: Cardiovascular;  Laterality: N/A;  . INCONTINENCE SURGERY  2009  . IR ANGIOGRAM SELECTIVE EACH ADDITIONAL VESSEL  01/31/2017  . IR ANGIOGRAM SELECTIVE EACH ADDITIONAL VESSEL  01/31/2017  . IR ANGIOGRAM VISCERAL SELECTIVE  01/31/2017  . IR EMBO ART  VEN HEMORR LYMPH EXTRAV  INC GUIDE ROADMAPPING  01/31/2017  . IR THORACENTESIS ASP PLEURAL SPACE W/IMG GUIDE  05/11/2017  . IR THORACENTESIS ASP PLEURAL SPACE W/IMG GUIDE  05/18/2017  . IR US GUIDE  VASC ACCESS RIGHT  01/31/2017  . LEFT HEART CATH AND CORS/GRAFTS ANGIOGRAPHY N/A 05/15/2017   Procedure: LEFT HEART CATH AND CORS/GRAFTS ANGIOGRAPHY;  Surgeon: Lennette Bihari, MD;  Location: [facility] INVASIVE CV LAB;  Service: Cardiovascular;  Laterality: N/A;  . LEFT HEART CATH AND CORS/GRAFTS ANGIOGRAPHY N/A 05/17/2017   Procedure: LEFT HEART CATH AND CORS/GRAFTS ANGIOGRAPHY;  Surgeon: Lennette Bihari, MD;  Location: [facility] INVASIVE CV LAB;  Service: Cardiovascular;  Laterality: N/A;  . NM MYOCAR PERF WALL MOTION  07/2011   bruce myoview - mild perfusion  defect in basal inferolateral & mid inferolateral regions (attenuation artifact, but prior infarct annote be excluded)' EF 49%; low risk  . TRANSTHORACIC ECHOCARDIOGRAM  07/2011   EF 45-50%, mild septal hypokinesis, mild inferior wall hypokinesis; RV systolc function mildly reduced; borderline LA enlargement; mild MR & TR; AV mildly sclerotic; mild pulm valve regurg      Current Outpatient Medications  Medication Sig Dispense Refill  . amiodarone (PACERONE) 200 MG tablet Take 0.5 tablets (100 mg total) by mouth daily. 90 tablet 1  . apixaban (ELIQUIS) 5 MG TABS tablet Take 1 tablet (5 mg total) by mouth 2 (two) times daily. 180 tablet 3  . carvedilol (COREG) 6.25 MG tablet Take 1 tablet (6.25 mg total) by mouth 2 (two) times daily with a meal. 180 tablet 3  . cholecalciferol (VITAMIN D) 1000 UNITS tablet Take 1,000 Units by mouth 2 (two) times daily.     . Cyanocobalamin (VITAMIN B-12 PO) Take by mouth daily.    Marland Kitchen ezetimibe (ZETIA) 10 MG tablet Take 1 tablet (10 mg total) by mouth daily. 90 tablet 3  . feeding supplement, ENSURE ENLIVE, (ENSURE ENLIVE) LIQD Take 237 mLs by mouth daily.    . ferrous sulfate 325 (65 FE) MG EC tablet Take 325 mg by mouth daily.    . insulin aspart (NOVOLOG) 100 UNIT/ML injection Inject 0-15 Units into the skin 3 (three) times daily with meals. 10 mL 11  . insulin glargine (LANTUS) 100 UNIT/ML injection Inject 0.06 mLs (6 Units total) into the skin at bedtime. 10 mL 11  . levothyroxine (SYNTHROID, LEVOTHROID) 50 MCG tablet TAKE 1 TABLET EVERY DAY 90 tablet 3  . losartan (COZAAR) 25 MG tablet Take 1 tablet (25 mg total) by mouth every evening. 45 tablet 3  . simvastatin (ZOCOR) 40 MG tablet Take 1 tablet (40 mg total) by mouth at bedtime. 90 tablet 3  . torsemide (DEMADEX) 20 MG tablet Take 40 mg by mouth daily.    Marland Kitchen venlafaxine XR (EFFEXOR-XR) 150 MG 24 hr capsule TAKE 1 CAPSULE EVERY DAY 90 capsule 3  . vitamin C (ASCORBIC ACID) 500 MG tablet Take 500 mg by mouth  daily.    . potassium chloride SA (K-DUR,KLOR-CON) 20 MEQ tablet Take 1 tablet (20 mEq total) by mouth daily. (Patient not taking: Reported on 05/17/2018) 90 tablet 3   No current facility-administered medications for this encounter.     Allergies:   Crestor [rosuvastatin] and Lipitor [atorvastatin]   Social History:  The patient  reports that she has never smoked. She has never used smokeless tobacco. She reports that she does not drink alcohol or use drugs.   Family History:  The patient's family history includes Cancer in her father, maternal grandmother, and paternal grandmother; Diabetes in her child; Diabetes (age of onset: 57) in her mother; Heart attack (age of onset: 76) in her father; Heart disease in her mother; Hyperlipidemia in her mother; Thyroid disease  in her sister.   ROS:  Please see the history of present illness.   All other systems are personally reviewed and negative.   Exam:  (Video/Tele Health Call; Exam is subjective and or/visual.) General:  Speaks in full sentences. No resp difficulty. Lungs: Normal respiratory effort with conversation.  Abdomen: Non-distended per patient report Extremities: Pt denies edema. Neuro: Alert & oriented x 3.   Recent Labs: 01/16/2018: B Natriuretic Peptide 662.4 02/27/2018: Hemoglobin 10.2; Platelets 308 05/17/2018: TSH 6.517 05/31/2018: ALT 38 07/01/2018: BUN 31; Creatinine, Ser 1.39; Potassium 4.6; Sodium 139  Personally reviewed   Wt Readings from Last 3 Encounters:  09/09/18 58.5 kg (129 lb)  06/10/18 62.5 kg (137 lb 12.8 oz)  05/17/18 60.3 kg (133 lb)      ASSESSMENT AND PLAN:  1. Chronic systolic CHF: Ischemic cardiomyopathy.  Echo 1/19 with EF 20-25%.  Echo 09/03/17 with EF 25-30%. Medtronic ICD.   NYHA II. Volume status sounds stable. Continue torsemide 40 mg daily.   - Continue Coreg 6.25 mg bid.  - Continue losartan to 25 mg qhs (take before bed to limit orthostatic symptoms).  - Not on spiro due to hyperkalemia.   2. CAD: No chest pain.  - Continue ASA 81 daily. Completed 1 year of plavix. She stopped plavix earlier this year.  - Continue simvastatin 40 daily with Zetia 10 daily. 3. Diabetes: Next step will be to add dapagliflozin to her regimen, mainly for its cardiac benefit.  4. Atrial fibrillation: Paroxysmal, noted post-op CABG.  Will need anticoagulation if it recurs.  5. Orthostatic hypotension/dizziness: Resolved.  6. PVCs: 20% PVCs on 6/19 holter.  ?If this could be contributing to cardiomyopathy.  She is now on amiodarone, this was cut back to 100 mg daily with elevated LFTs.   - Check LFTS and TSH.  She will need a regular eye exam.  7. CKD: Stage 3.  She has had hyperkalemia in the past.  Check BMEt this week.    COVID screen The patient does not have any symptoms that suggest any further testing/ screening at this time.  Social distancing reinforced today.  Patient Risk: After full review of this patients clinical status, I feel that they are at moderate risk for cardiac decompensation at this time.  Relevant cardiac medications were reviewed at length with the patient today. The patient does not have concerns regarding their medications at this time.   The following changes were made today:  Set up lab appointment for BMET, LFTs, TSH.  Recommended follow-up:   Follow up 3 months Dr Shirlee LatchMcLean.    Today, I have spent 22 minutes with the patient with telehealth technology discussing the above issues.    Waneta MartinsSigned, Amy Clegg, NP  09/09/2018 1:56 PM  [facility] [address] [facility] [place] [address] [phone] (office) [phone] (fax)

## 2018-09-12 ENCOUNTER — Other Ambulatory Visit (HOSPITAL_COMMUNITY): Payer: Medicare HMO

## 2018-10-02 ENCOUNTER — Other Ambulatory Visit (HOSPITAL_COMMUNITY): Payer: Self-pay

## 2018-10-02 MED ORDER — SIMVASTATIN 40 MG PO TABS: 40.0000 mg | ORAL_TABLET | Freq: Every day | ORAL | 3 refills | Status: DC

## 2018-10-06 IMAGING — DX DG TIBIA/FIBULA 2V*L*
3 series · 3 of 3 positions shown · non-contrast
Comparison: None.

CLINICAL DATA: Injury

EXAM:
LEFT TIBIA AND FIBULA - 2 VIEW

[tibia ap (1 of 2)]
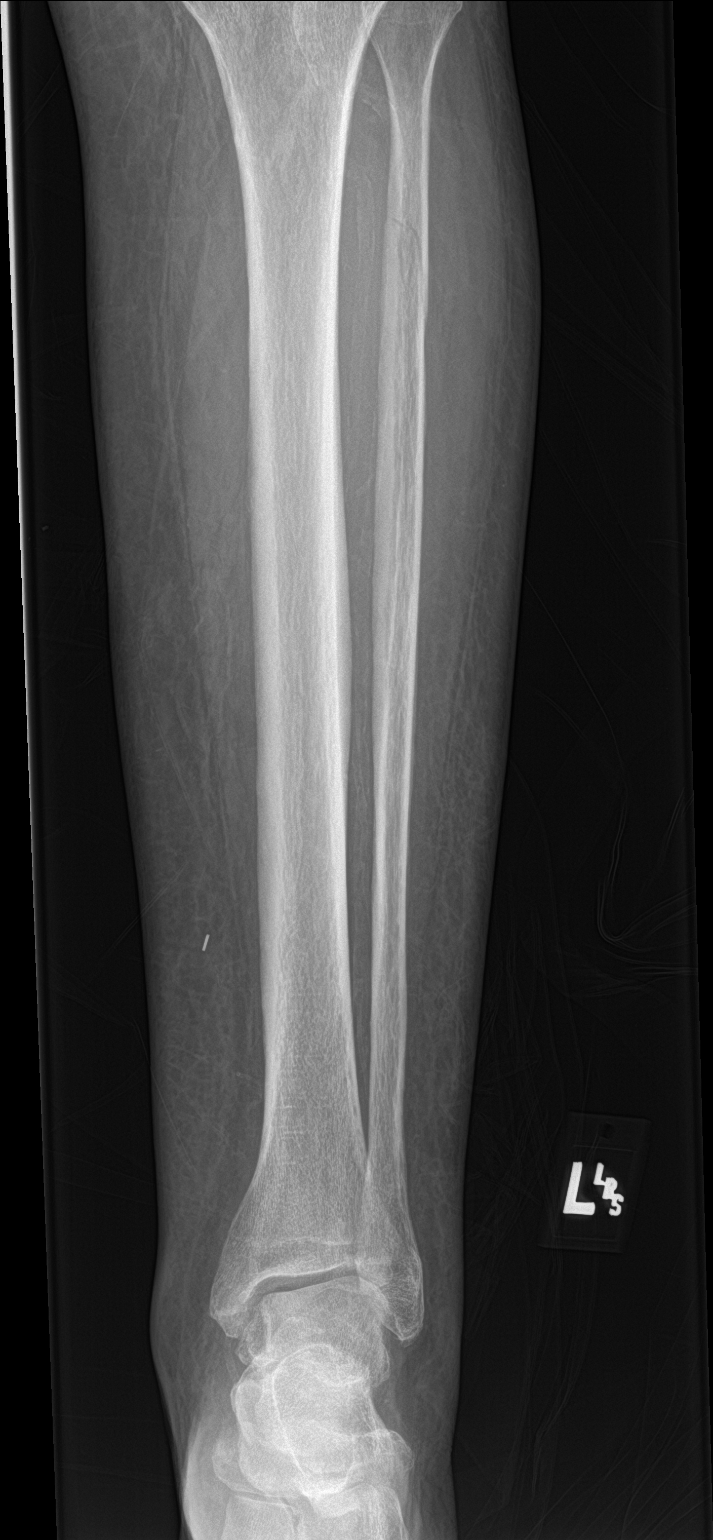

[tibia ap (2 of 2)]
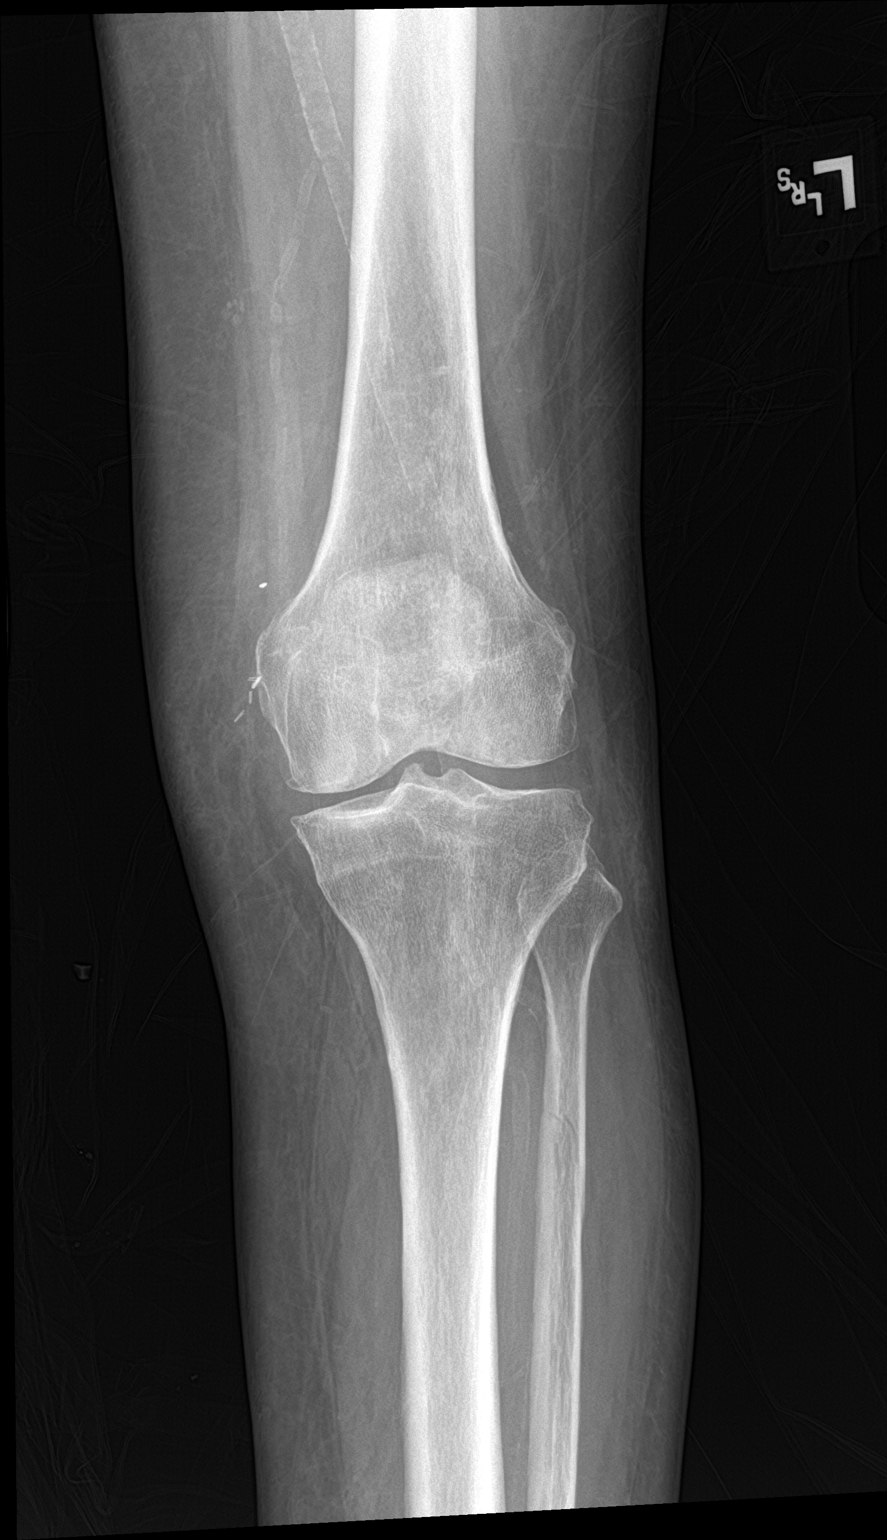

[tibia lat]
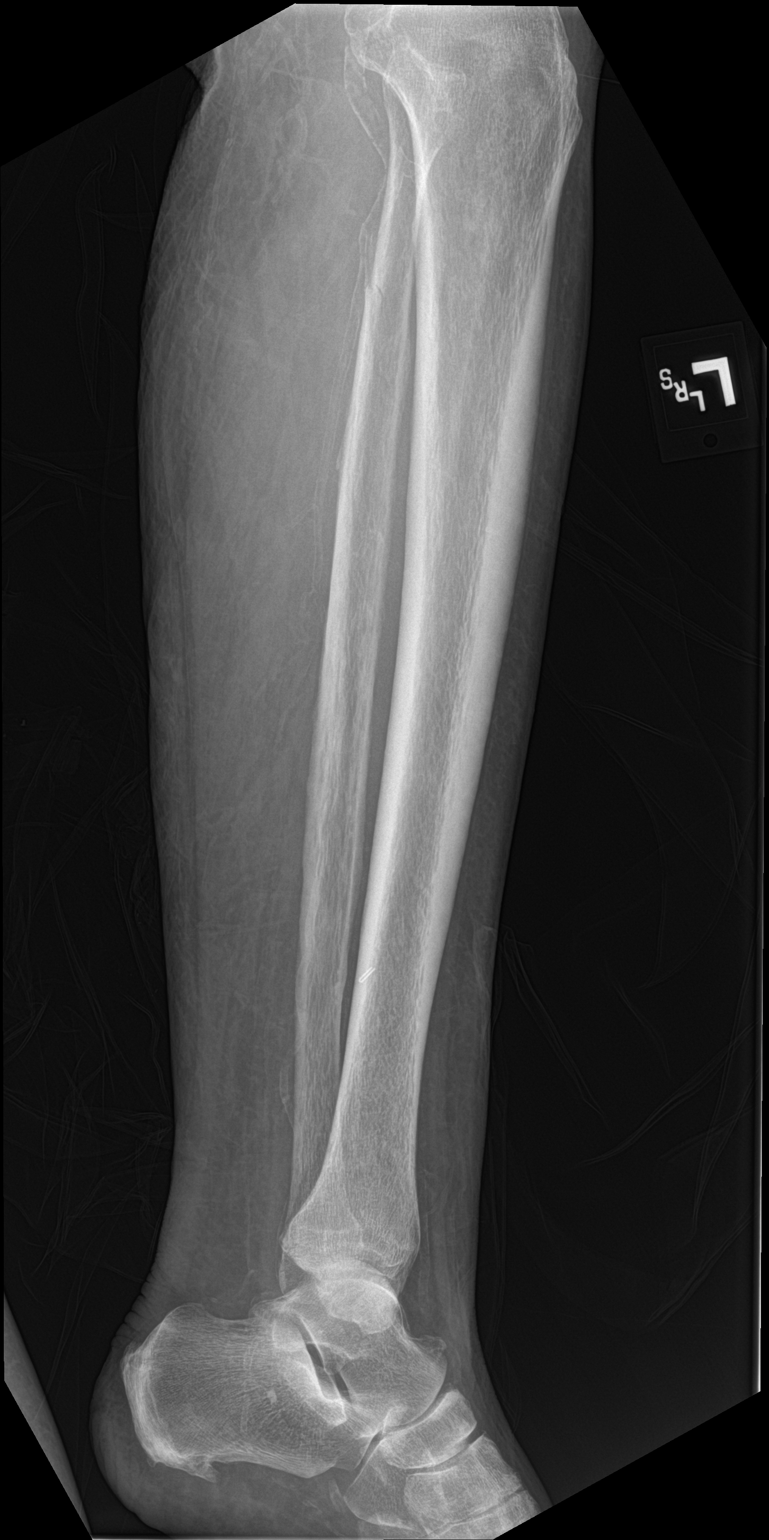

[3 of 3 positions shown; findings below may reference images not displayed]

FINDINGS: Vascular calcifications.  Moderate plantar calcaneal spur.

Subtle linear lucency within the proximal shaft of the fibula,
suspicious for subtle nondisplaced fracture. Mild degenerative
changes of the medial compartment at the knee
IMPRESSION: 1. Suspect subtle nondisplaced fracture involving the proximal shaft
of the fibula.

## 2018-10-11 DIAGNOSIS — E1165 Type 2 diabetes mellitus with hyperglycemia: Secondary | ICD-10-CM | POA: Diagnosis not present

## 2018-10-11 DIAGNOSIS — E1122 Type 2 diabetes mellitus with diabetic chronic kidney disease: Secondary | ICD-10-CM | POA: Diagnosis not present

## 2018-10-11 DIAGNOSIS — I2510 Atherosclerotic heart disease of native coronary artery without angina pectoris: Secondary | ICD-10-CM | POA: Diagnosis not present

## 2018-10-11 DIAGNOSIS — I480 Paroxysmal atrial fibrillation: Secondary | ICD-10-CM | POA: Diagnosis not present

## 2018-10-14 DIAGNOSIS — E1122 Type 2 diabetes mellitus with diabetic chronic kidney disease: Secondary | ICD-10-CM | POA: Diagnosis not present

## 2018-10-14 DIAGNOSIS — E782 Mixed hyperlipidemia: Secondary | ICD-10-CM | POA: Diagnosis not present

## 2018-10-14 DIAGNOSIS — I130 Hypertensive heart and chronic kidney disease with heart failure and stage 1 through stage 4 chronic kidney disease, or unspecified chronic kidney disease: Secondary | ICD-10-CM | POA: Diagnosis not present

## 2018-10-14 DIAGNOSIS — E1165 Type 2 diabetes mellitus with hyperglycemia: Secondary | ICD-10-CM | POA: Diagnosis not present

## 2018-10-22 ENCOUNTER — Other Ambulatory Visit: Payer: Self-pay | Admitting: Internal Medicine

## 2018-11-19 ENCOUNTER — Other Ambulatory Visit (HOSPITAL_COMMUNITY): Payer: Self-pay

## 2018-11-19 MED ORDER — APIXABAN 5 MG PO TABS: 5.0000 mg | ORAL_TABLET | Freq: Two times a day (BID) | ORAL | 3 refills | Status: DC

## 2018-11-19 MED ORDER — VENLAFAXINE HCL ER 150 MG PO CP24: 150.0000 mg | ORAL_CAPSULE | Freq: Every day | ORAL | 3 refills | Status: DC

## 2018-11-19 MED ORDER — EZETIMIBE 10 MG PO TABS: 10.0000 mg | ORAL_TABLET | Freq: Every day | ORAL | 3 refills | Status: DC

## 2018-11-26 ENCOUNTER — Other Ambulatory Visit (HOSPITAL_COMMUNITY): Payer: Self-pay

## 2018-11-26 MED ORDER — APIXABAN 5 MG PO TABS: 5.0000 mg | ORAL_TABLET | Freq: Two times a day (BID) | ORAL | 3 refills | Status: DC

## 2018-11-26 MED ORDER — EZETIMIBE 10 MG PO TABS: 10.0000 mg | ORAL_TABLET | Freq: Every day | ORAL | 3 refills | Status: DC

## 2018-11-26 MED ORDER — VENLAFAXINE HCL ER 150 MG PO CP24: 150.0000 mg | ORAL_CAPSULE | Freq: Every day | ORAL | 3 refills | Status: AC

## 2018-11-27 ENCOUNTER — Ambulatory Visit (INDEPENDENT_AMBULATORY_CARE_PROVIDER_SITE_OTHER): Payer: Medicare HMO | Admitting: *Deleted

## 2018-11-27 DIAGNOSIS — I255 Ischemic cardiomyopathy: Secondary | ICD-10-CM | POA: Diagnosis not present

## 2018-11-27 DIAGNOSIS — I5022 Chronic systolic (congestive) heart failure: Secondary | ICD-10-CM

## 2018-11-28 ENCOUNTER — Telehealth: Payer: Self-pay

## 2018-11-28 LAB — CUP PACEART REMOTE DEVICE CHECK
Battery Remaining Longevity: 124 mo
Battery Voltage: 3.01 V
Brady Statistic RV Percent Paced: 0.03 %
Date Time Interrogation Session: 20200723114428
HighPow Impedance: 57 Ohm
Implantable Lead Implant Date: 20190531
Implantable Lead Location: 753860
Implantable Pulse Generator Implant Date: 20190531
Lead Channel Impedance Value: 380 Ohm
Lead Channel Impedance Value: 437 Ohm
Lead Channel Pacing Threshold Amplitude: 0.875 V
Lead Channel Pacing Threshold Pulse Width: 0.4 ms
Lead Channel Sensing Intrinsic Amplitude: 9.875 mV
Lead Channel Setting Pacing Amplitude: 2.5 V
Lead Channel Setting Pacing Pulse Width: 0.4 ms
Lead Channel Setting Sensing Sensitivity: 0.3 mV

## 2018-11-28 NOTE — Telephone Encounter (Signed)
Spoke with patient to remind of missed remote transmission 

## 2018-12-10 ENCOUNTER — Encounter (HOSPITAL_COMMUNITY): Payer: Medicare HMO | Admitting: Cardiology

## 2018-12-11 NOTE — Progress Notes (Signed)
Remote ICD transmission.   

## 2018-12-12 ENCOUNTER — Other Ambulatory Visit: Payer: Self-pay | Admitting: Internal Medicine

## 2019-01-08 IMAGING — DX DG CHEST 2V
2 series · 2 of 2 positions shown · non-contrast
Comparison: February 14, 2017

CLINICAL DATA: Cough for 1 month.  Decreased oxygen saturation

EXAM:
CHEST  2 VIEW

[chest pa]
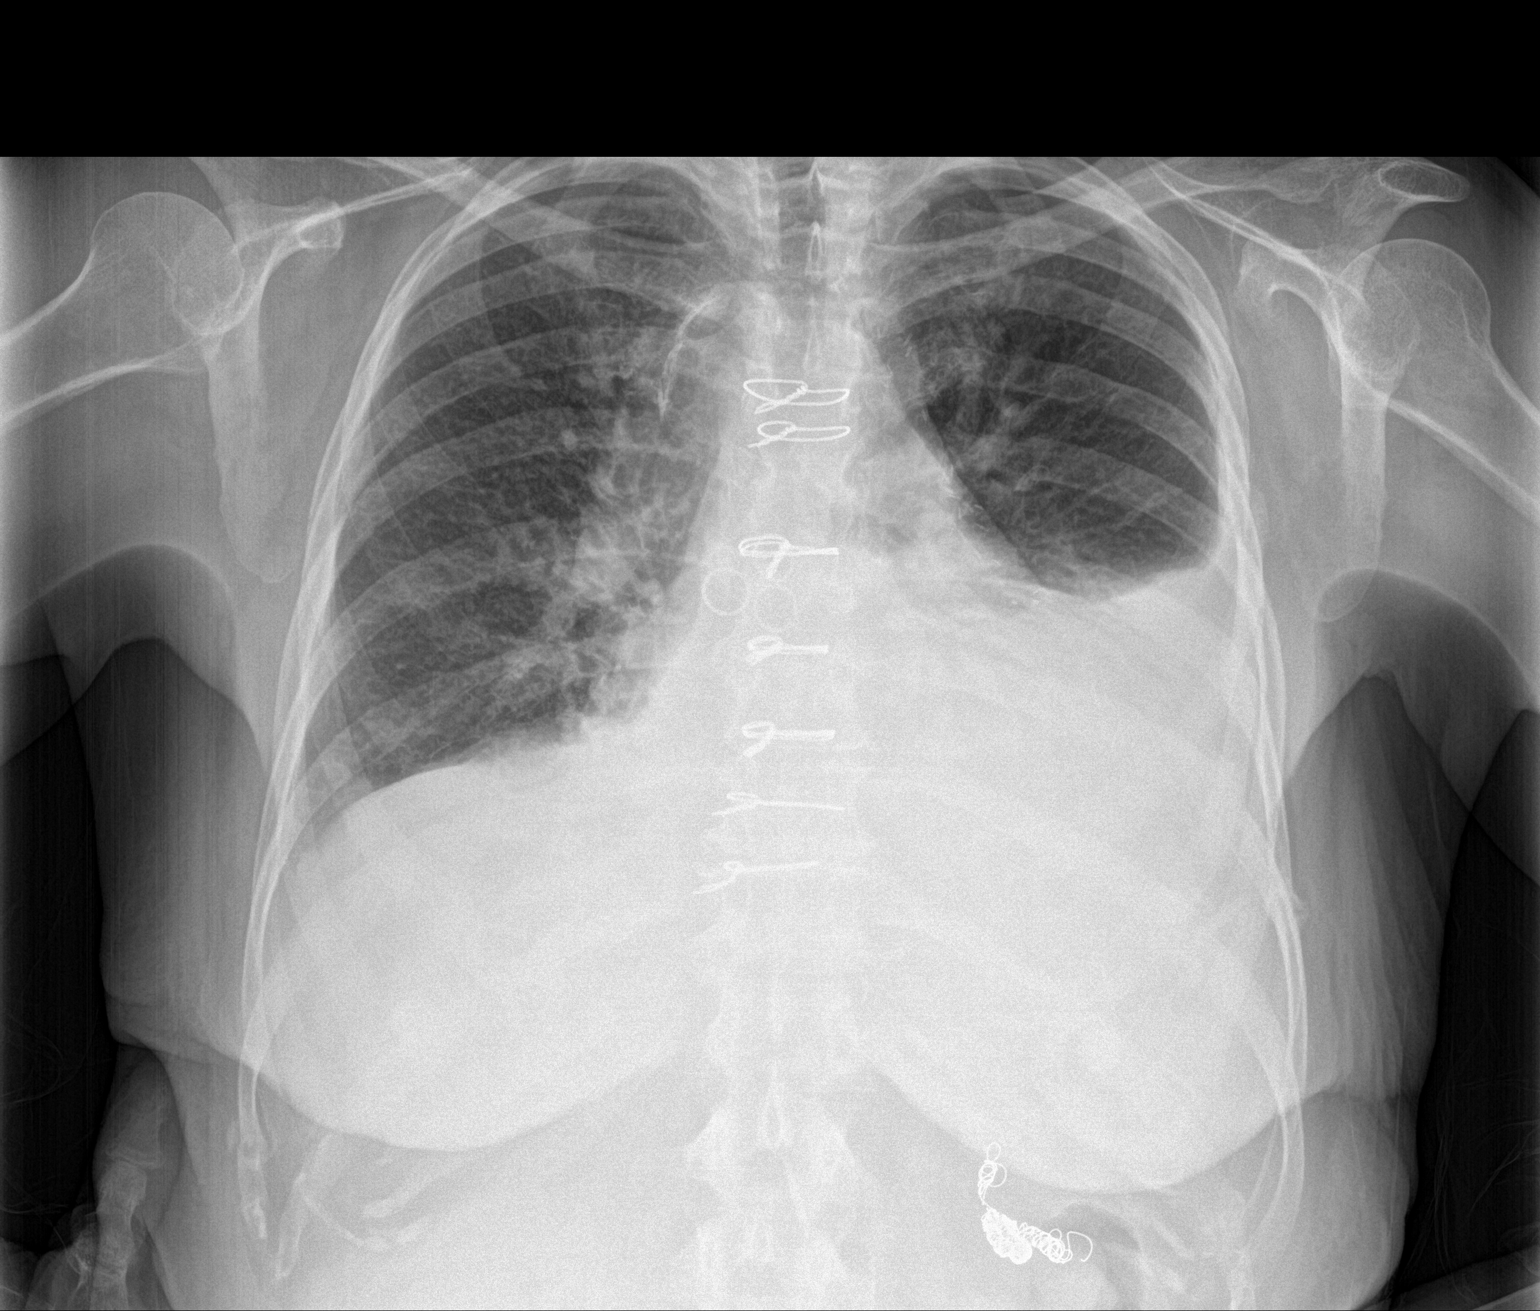

[chest lat]
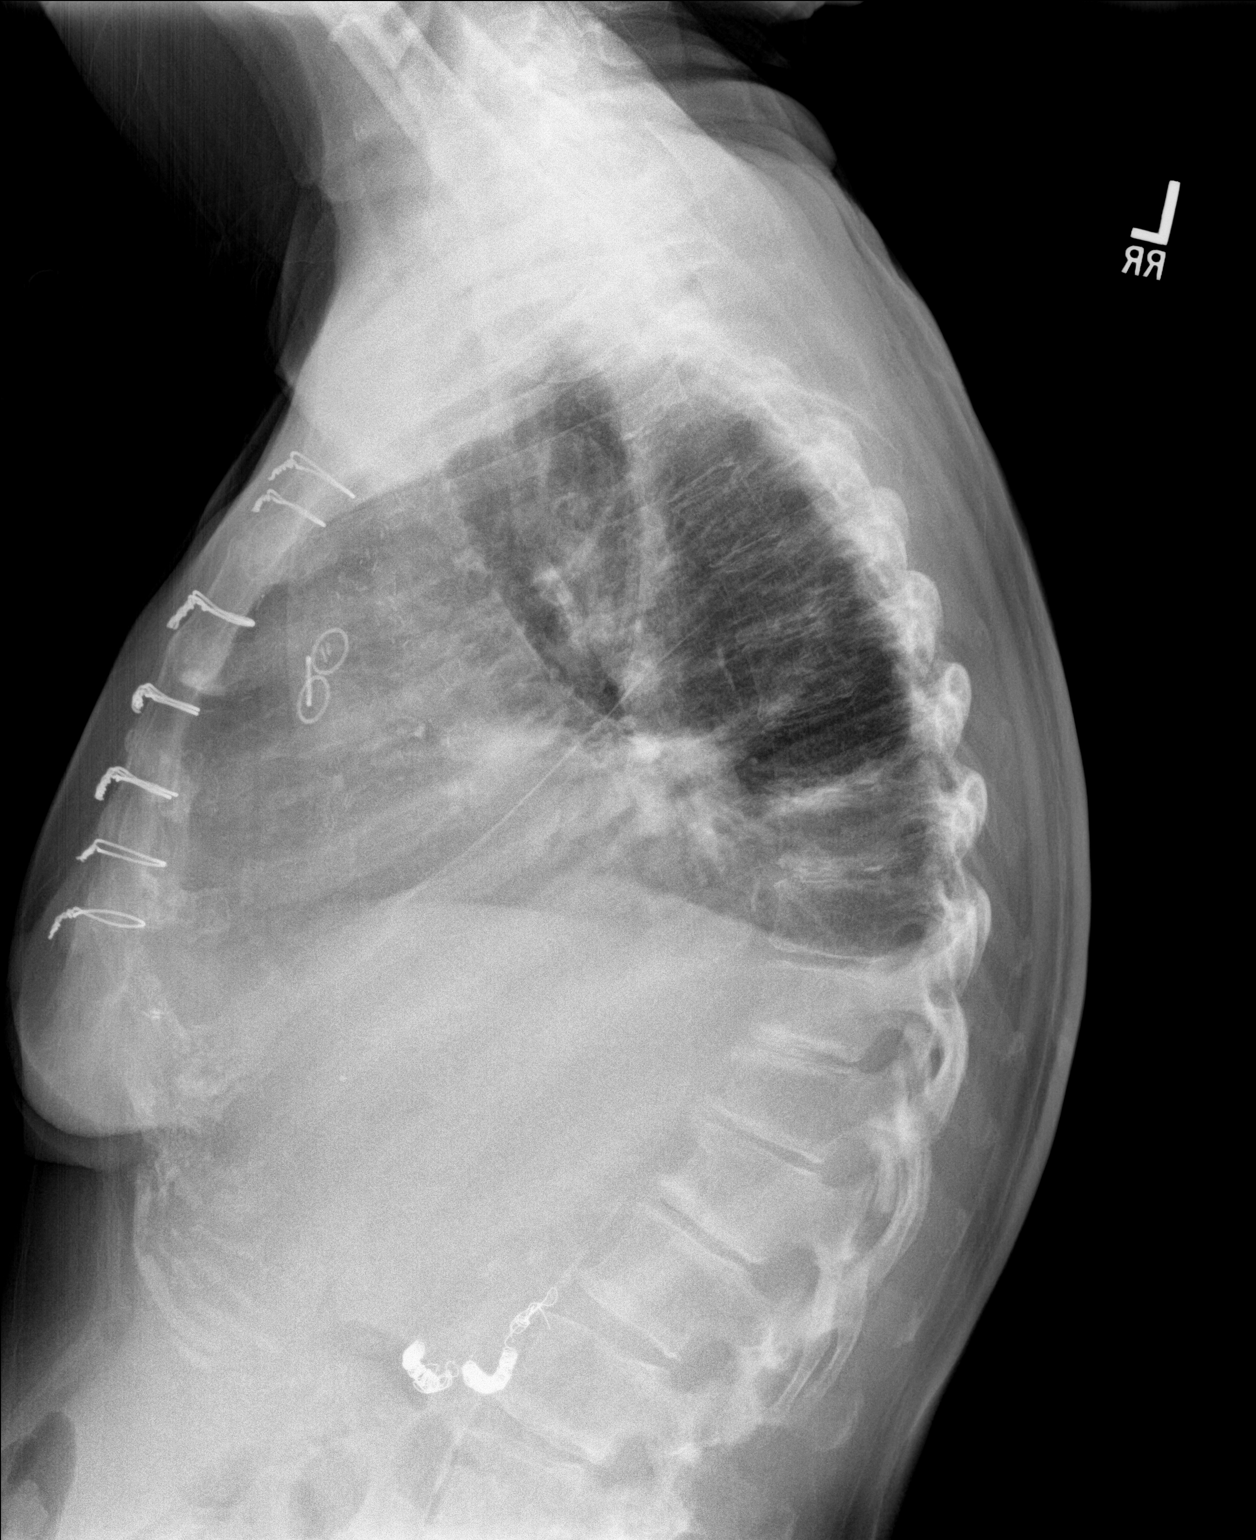

[2 of 2 positions shown; findings below may reference images not displayed]

FINDINGS: There is a moderate pleural effusion on the left with patchy
consolidation in the left base. There is scarring in the right upper
lobe medially. There is atelectasis in the medial right base.

Heart size and pulmonary vascularity are normal. Patient is status
post coronary artery bypass grafting. No evident adenopathy. There
is aortic atherosclerosis. There is degenerative change in the
thoracic spine.
IMPRESSION: Moderate pleural effusion on the left with left base patchy
consolidation, largely atelectatic. A degree of superimposed
pneumonia left base cannot be excluded. There is mild medial right
base atelectasis. There is scarring right upper lobe medially.

Cardiac silhouette is within normal limits. There is aortic
atherosclerosis.

Aortic Atherosclerosis (4OFJE-C90.0).

## 2019-01-15 IMAGING — CR DG CHEST 1V
2 series · 2 of 2 positions shown · non-contrast
Comparison: Chest radiographs and CTA 05/10/2017

CLINICAL DATA: Status post thoracentesis.

EXAM:
CHEST 1 VIEW

[chest pa]
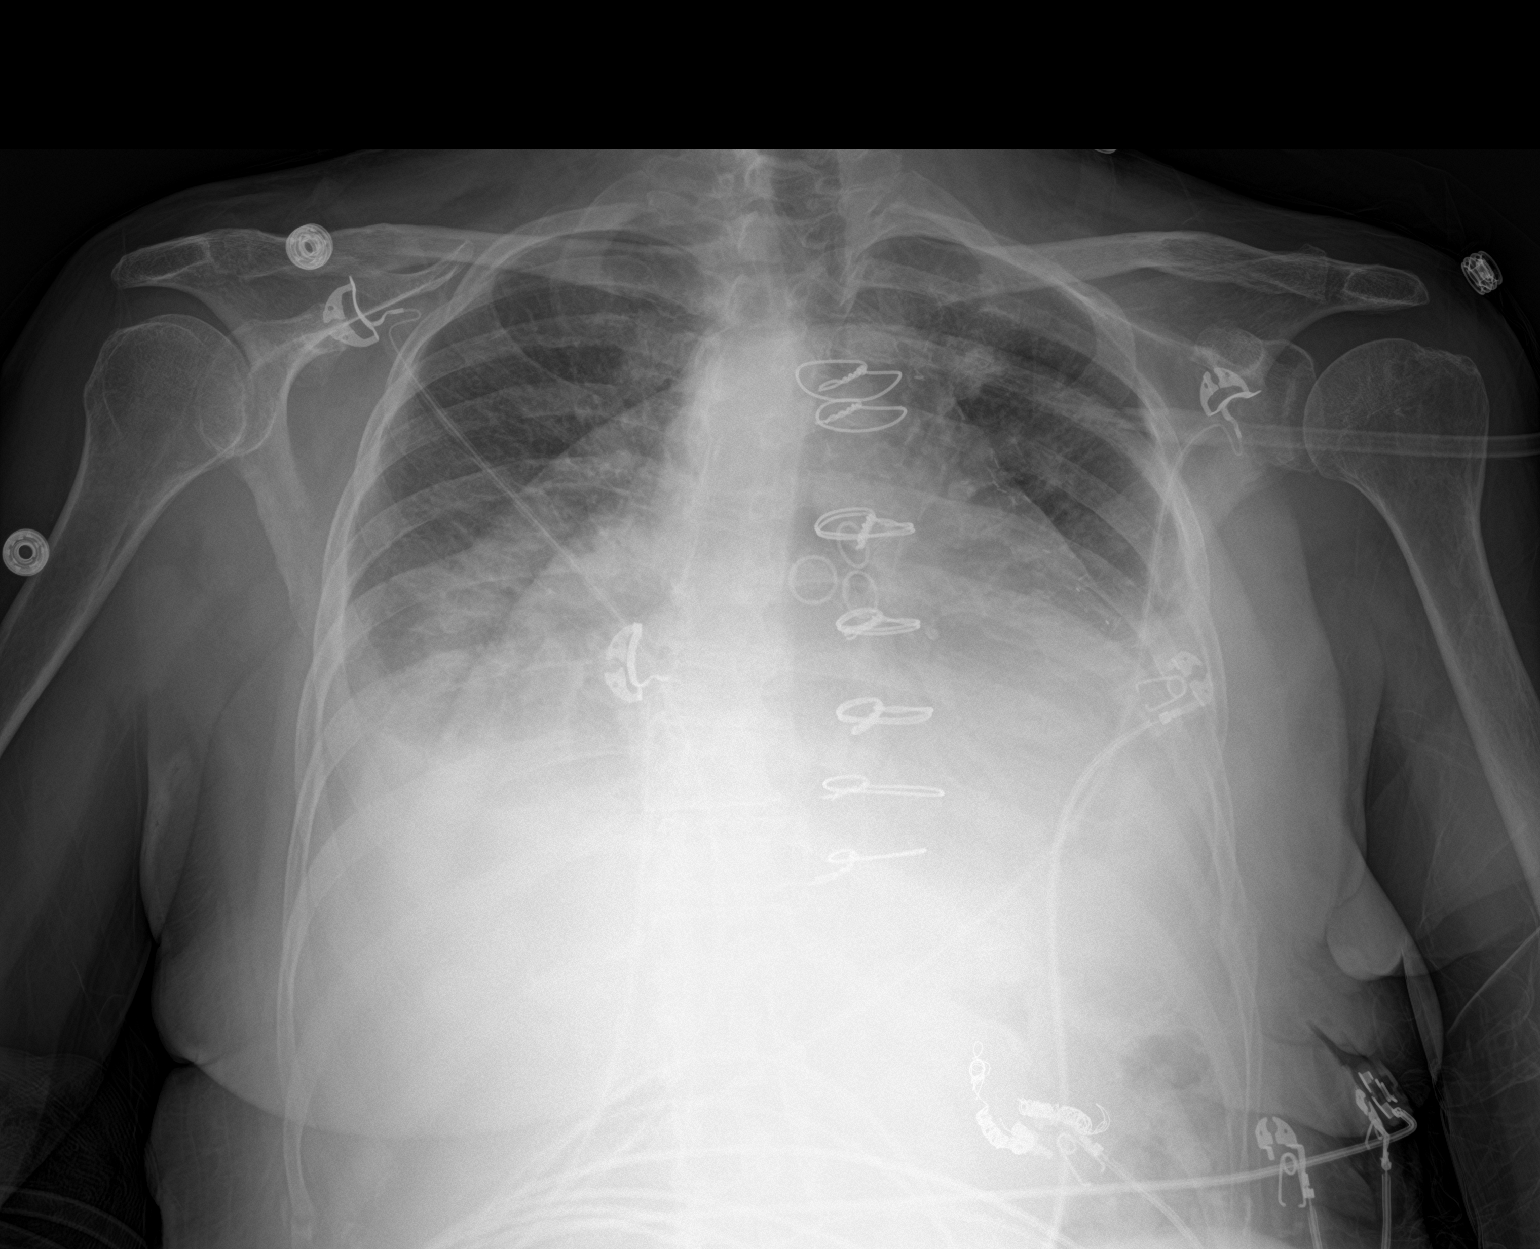

[chest ap]
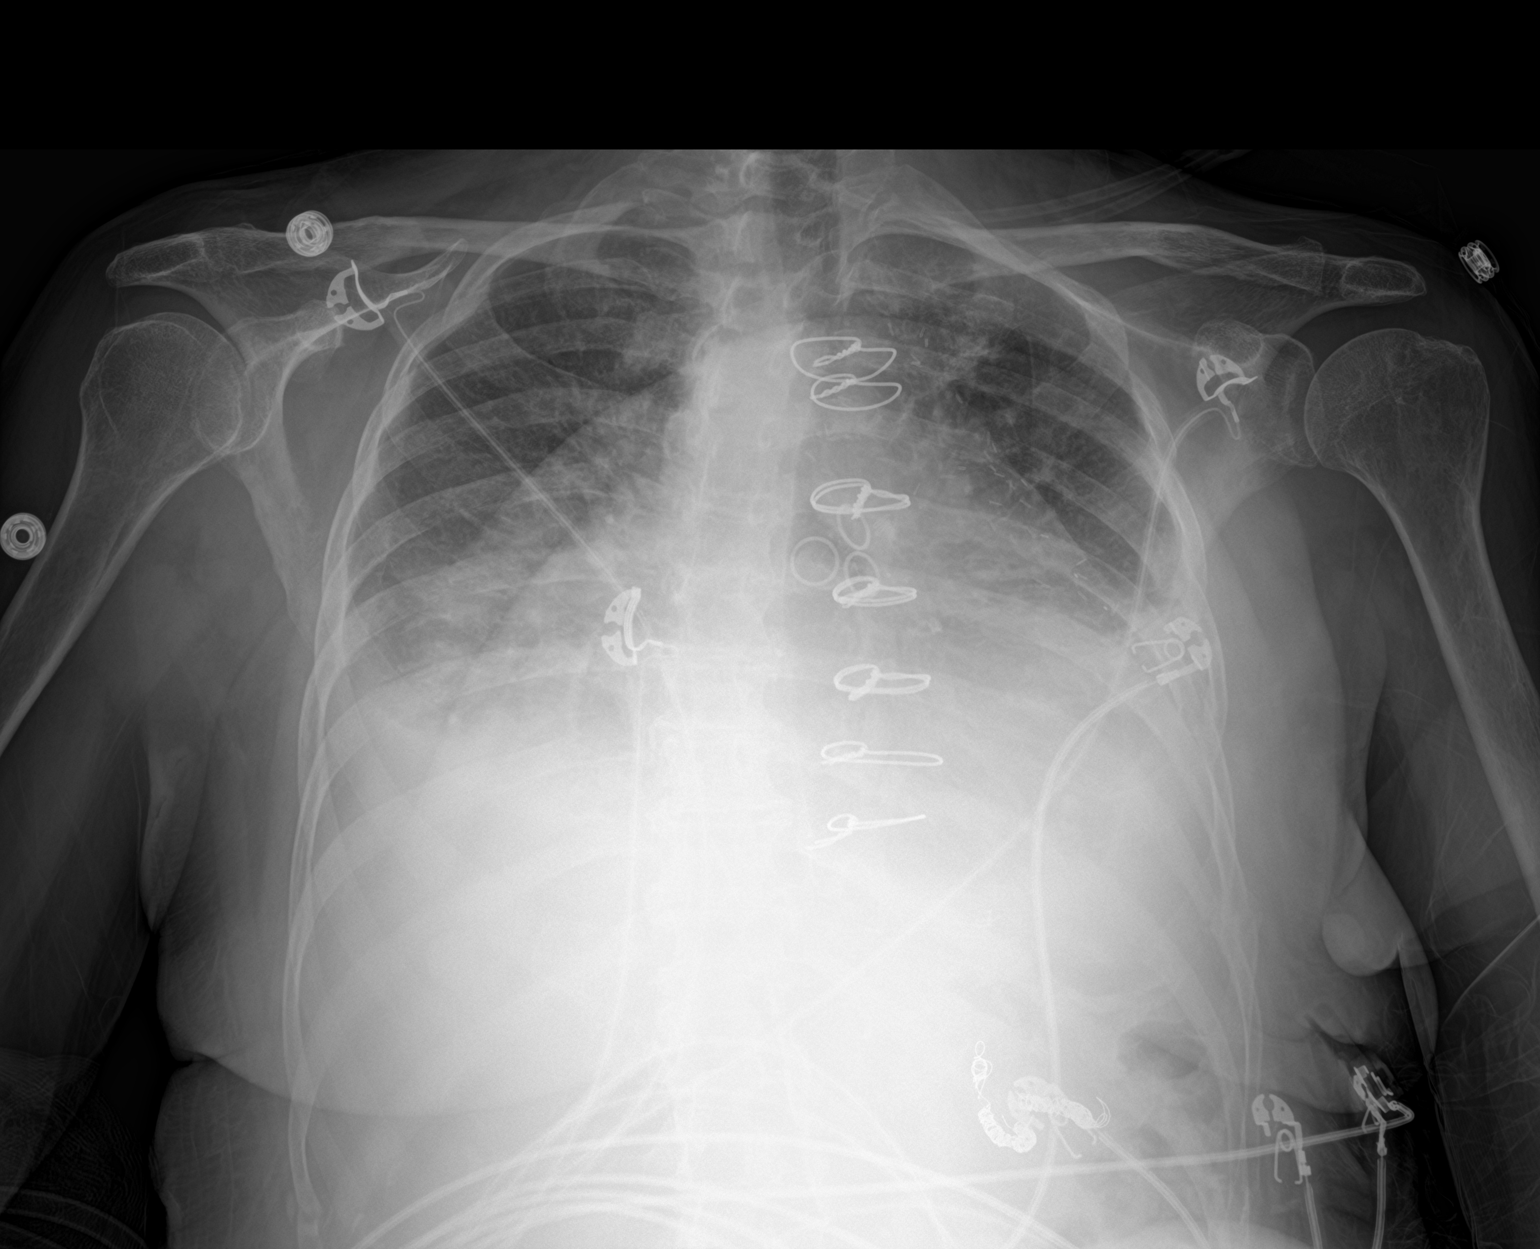

[2 of 2 positions shown; findings below may reference images not displayed]

FINDINGS: Sequelae of prior CABG are again identified. The cardiac silhouette
remains enlarged. There are persistent bilateral pleural effusions,
likely moderate in size. The left pleural effusion appears smaller
following interval thoracentesis, with some improved aeration of the
left lower lobe. Pulmonary vascular congestion is similar to the
prior study. There is persistent bibasilar atelectasis. No
pneumothorax is identified.
IMPRESSION: 1. Decreased size of left pleural effusion following thoracentesis
with improved left lower lobe aeration. No pneumothorax.
2. Persistent moderate right pleural effusion.
3. Cardiomegaly and pulmonary vascular congestion.

## 2019-01-22 ENCOUNTER — Telehealth: Payer: Self-pay | Admitting: Emergency Medicine

## 2019-01-22 NOTE — Telephone Encounter (Signed)
Received alert for increased episodes of AF. AT/AF burden increased to 10.4%. Patient denies SOB, CP or syncope. 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 She reports increased stress and that she has missed some doses of her Eliquis and amiodorone. Educated on importance of regular medication doses and importance of maintaining consistant [place] due to risk of stroke associated with AF. Overdue for f/u with Lynnell Jude FNP.

## 2019-01-23 NOTE — Progress Notes (Deleted)
Electrophysiology Office Note Date: 01/23/2019  ID:  [name], [name] 28-Oct-1945, MRN [id number]  PCP: Merrilee Seashore, MD Primary Cardiologist: Aundra Dubin Electrophysiologist: Caryl Comes  CC: Routine ICD follow-up  [name] is a 73 y.o. female seen today for Dr Caryl Comes.  She presents today for routine electrophysiology followup.  Since last being seen in our clinic, the patient reports doing very well. She denies chest pain, palpitations, dyspnea, PND, orthopnea, nausea, vomiting, dizziness, syncope, edema, weight gain, or early satiety.  She has not had ICD shocks.   Device History: MDT single chamber ICD implanted 2019 for ICM History of appropriate therapy: No History of AAD therapy: Yes - amiodarone for AF   Past Medical History:  Diagnosis Date  . Cholelithiases   . Coronary artery disease 02/2007   CABG  . Diabetes ([place])   . Dyslipidemia   . HTN (hypertension)   . Hypothyroid   . PAF (paroxysmal atrial fibrillation) ([place])    post op  . S/P CABG x 4 2008   Past Surgical History:  Procedure Laterality Date  . ABDOMINAL HYSTERECTOMY    . CARDIAC CATHETERIZATION  03/01/2007   LAD with 99% prox lesion, at birfucation of Cfx there is 99% lesion, at bifurcation of large OM1 there is 99% lesion, 70% prox lesion at ramus intermedius, 90% lesion in prox RCA (Dr. Norlene Duel) - susequent CABG  . CORONARY ARTERY BYPASS GRAFT  03/05/2007   LIMA-LAD, SVG-ramus intermediate. SVG-OM, SVG-PDA (Dr. Prescott Gum)  . CORONARY ATHERECTOMY N/A 05/17/2017   Procedure: CORONARY ATHERECTOMY;  Surgeon: Troy Sine, MD;  Location: [place] CV LAB;  Service: Cardiovascular;  Laterality: N/A;  . CORONARY STENT INTERVENTION N/A 05/17/2017   Procedure: CORONARY STENT INTERVENTION;  Surgeon: Troy Sine, MD;  Location: [place] CV LAB;  Service: Cardiovascular;  Laterality: N/A;  . ICD IMPLANT N/A 10/05/2017   Procedure: ICD IMPLANT;  Surgeon: Deboraha Sprang, MD;  Location: [place]  CV LAB;  Service: Cardiovascular;  Laterality: N/A;  . INCONTINENCE SURGERY  2009  . IR ANGIOGRAM SELECTIVE EACH ADDITIONAL VESSEL  01/31/2017  . IR ANGIOGRAM SELECTIVE EACH ADDITIONAL VESSEL  01/31/2017  . IR ANGIOGRAM VISCERAL SELECTIVE  01/31/2017  . IR EMBO ART  VEN HEMORR LYMPH EXTRAV  INC GUIDE ROADMAPPING  01/31/2017  . IR THORACENTESIS ASP PLEURAL SPACE W/IMG GUIDE  05/11/2017  . IR THORACENTESIS ASP PLEURAL SPACE W/IMG GUIDE  05/18/2017  . IR US GUIDE VASC ACCESS RIGHT  01/31/2017  . LEFT HEART CATH AND CORS/GRAFTS ANGIOGRAPHY N/A 05/15/2017   Procedure: LEFT HEART CATH AND CORS/GRAFTS ANGIOGRAPHY;  Surgeon: Troy Sine, MD;  Location: [place] CV LAB;  Service: Cardiovascular;  Laterality: N/A;  . LEFT HEART CATH AND CORS/GRAFTS ANGIOGRAPHY N/A 05/17/2017   Procedure: LEFT HEART CATH AND CORS/GRAFTS ANGIOGRAPHY;  Surgeon: Troy Sine, MD;  Location: [place] CV LAB;  Service: Cardiovascular;  Laterality: N/A;  . NM MYOCAR PERF WALL MOTION  07/2011   bruce myoview - mild perfusion defect in basal inferolateral & mid inferolateral regions (attenuation artifact, but prior infarct annote be excluded)' EF 49%; low risk  . TRANSTHORACIC ECHOCARDIOGRAM  07/2011   EF 45-50%, mild septal hypokinesis, mild inferior wall hypokinesis; RV systolc function mildly reduced; borderline LA enlargement; mild MR & TR; AV mildly sclerotic; mild pulm valve regurg     Current Outpatient Medications  Medication Sig Dispense Refill  . amiodarone (PACERONE) 200 MG tablet Take 0.5 tablets (100 mg total)  by mouth daily. 90 tablet 1  . apixaban (ELIQUIS) 5 MG TABS tablet Take 1 tablet (5 mg total) by mouth 2 (two) times daily. 180 tablet 3  . carvedilol (COREG) 6.25 MG tablet Take 1 tablet (6.25 mg total) by mouth 2 (two) times daily with a meal. 180 tablet 3  . cholecalciferol (VITAMIN D) 1000 UNITS tablet Take 1,000 Units by mouth 2 (two) times daily.     . Cyanocobalamin (VITAMIN B-12 PO) Take by mouth  daily.    Marland Kitchen ezetimibe (ZETIA) 10 MG tablet Take 1 tablet (10 mg total) by mouth daily. 90 tablet 3  . feeding supplement, ENSURE ENLIVE, (ENSURE ENLIVE) LIQD Take 237 mLs by mouth daily.    . ferrous sulfate 325 (65 FE) MG EC tablet Take 325 mg by mouth daily.    . insulin aspart (NOVOLOG) 100 UNIT/ML injection Inject 0-15 Units into the skin 3 (three) times daily with meals. 10 mL 11  . insulin glargine (LANTUS) 100 UNIT/ML injection Inject 0.06 mLs (6 Units total) into the skin at bedtime. 10 mL 11  . levothyroxine (SYNTHROID, LEVOTHROID) 50 MCG tablet TAKE 1 TABLET EVERY DAY 90 tablet 3  . losartan (COZAAR) 25 MG tablet TAKE 1 TABLET EVERY EVENING 90 tablet 3  . potassium chloride SA (K-DUR,KLOR-CON) 20 MEQ tablet Take 1 tablet (20 mEq total) by mouth daily. (Patient not taking: Reported on 05/17/2018) 90 tablet 3  . simvastatin (ZOCOR) 40 MG tablet Take 1 tablet (40 mg total) by mouth at bedtime. 90 tablet 3  . torsemide (DEMADEX) 20 MG tablet Take 40 mg by mouth daily.    Marland Kitchen venlafaxine XR (EFFEXOR-XR) 150 MG 24 hr capsule Take 1 capsule (150 mg total) by mouth daily. 90 capsule 3  . vitamin C (ASCORBIC ACID) 500 MG tablet Take 500 mg by mouth daily.     No current facility-administered medications for this visit.     Allergies:   Crestor [rosuvastatin] and Lipitor [atorvastatin]   Social History: Social History   Socioeconomic History  . Marital status: Married    Spouse name: Not on file  . Number of children: 1  . Years of education: 32  . Highest education level: Not on file  Occupational History    Employer: [employer]  Social Needs  . Financial resource strain: Not hard at all  . Food insecurity    Worry: Never true    Inability: Never true  . Transportation needs    Medical: Yes    Non-medical: Yes  Tobacco Use  . Smoking status: Never Smoker  . Smokeless tobacco: Never Used  Substance and Sexual Activity  . Alcohol use: No  . Drug use: No  . Sexual  activity: Never    Birth control/protection: Abstinence  Lifestyle  . Physical activity    Days per week: Not on file    Minutes per session: Not on file  . Stress: Not on file  Relationships  . Social Musician on phone: Not on file    Gets together: Not on file    Attends religious service: Not on file    Active member of club or organization: Not on file    Attends meetings of clubs or organizations: Not on file    Relationship status: Not on file  . Intimate partner violence    Fear of current or ex partner: Not on file    Emotionally abused: Not on file    Physically abused: Not  on file    Forced sexual activity: Not on file  Other Topics Concern  . Not on file  Social History Narrative  . Not on file    Family History: Family History  Problem Relation Age of Onset  . Diabetes Mother 3361  . Heart disease Mother   . Hyperlipidemia Mother   . Cancer Father   . Heart attack Father 1070  . Cancer Maternal Grandmother   . Cancer Paternal Grandmother   . Thyroid disease Sister   . Diabetes Child     Review of Systems: All other systems reviewed and are otherwise negative except as noted above.   Physical Exam: VS:  There were no vitals taken for this visit. , BMI There is no height or weight on file to calculate BMI.  GEN- The patient is well appearing, alert and oriented x 3 today.   HEENT: normocephalic, atraumatic; sclera clear, conjunctiva pink; hearing intact; oropharynx clear; neck supple, no JVP Lymph- no cervical lymphadenopathy Lungs- Clear to ausculation bilaterally, normal work of breathing.  No wheezes, rales, rhonchi Heart- Regular rate and rhythm, no murmurs, rubs or gallops, PMI not laterally displaced GI- soft, non-tender, non-distended, bowel sounds present, no hepatosplenomegaly Extremities- no clubbing, cyanosis, or edema; DP/PT/radial pulses 2+ bilaterally MS- no significant deformity or atrophy Skin- warm and dry, no rash or lesion;  ICD pocket well healed Psych- euthymic mood, full affect Neuro- strength and sensation are intact  ICD interrogation- reviewed in detail today,  See PACEART report  EKG:  EKG is not ordered today.  Recent Labs: 02/27/2018: Hemoglobin 10.2; Platelets 308 05/17/2018: TSH 6.517 05/31/2018: ALT 38 07/01/2018: BUN 31; Creatinine, Ser 1.39; Potassium 4.6; Sodium 139   Wt Readings from Last 3 Encounters:  09/09/18 129 lb (58.5 kg)  06/10/18 137 lb 12.8 oz (62.5 kg)  05/17/18 133 lb (60.3 kg)     Other studies Reviewed: Additional studies/ records that were reviewed today include: Dr Graciela HusbandsKlein and Dr Alford HighlandMcLean's office notes   Assessment and Plan:  1.  Chronic systolic dysfunction euvolemic today Stable on an appropriate medical regimen Normal ICD function See Pace Art report No changes today [facility]  2.  Paroxysmal atrial fibrillation Burden by device interrogation ***% Continue amiodarone for rhythm control TSH, LFT's today Continue Eliquis for CHADS2VASC of 5 CBC, BMET today    Current medicines are reviewed at length with the patient today.   The patient does not have concerns regarding her medicines.  The following changes were made today:  none  Labs/ tests ordered today include: CBC, BMET, LFT, TSH No orders of the defined types were placed in this encounter.    Disposition:   Follow up with [facility], ICM clinic,  Dr Graciela HusbandsKlein 1 year   Signed, Gypsy BalsamAmber Seiler, NP 01/23/2019 7:23 PM  [facility] [address] [place] [address] [phone] (office) [phone] (fax)

## 2019-01-24 ENCOUNTER — Encounter: Payer: Medicare HMO | Admitting: Nurse Practitioner

## 2019-01-24 NOTE — Telephone Encounter (Addendum)
   10 episodes of similar nature on 01/21/2019.

## 2019-01-24 NOTE — Telephone Encounter (Signed)
[name]   thnks   Can you do me a big favor and include the data pictures from your concern-- I cant find the data, neither under media or in my results  sk

## 2019-01-31 NOTE — Telephone Encounter (Signed)
arifact

## 2019-02-10 DIAGNOSIS — I130 Hypertensive heart and chronic kidney disease with heart failure and stage 1 through stage 4 chronic kidney disease, or unspecified chronic kidney disease: Secondary | ICD-10-CM | POA: Diagnosis not present

## 2019-02-10 DIAGNOSIS — E1122 Type 2 diabetes mellitus with diabetic chronic kidney disease: Secondary | ICD-10-CM | POA: Diagnosis not present

## 2019-02-10 DIAGNOSIS — E782 Mixed hyperlipidemia: Secondary | ICD-10-CM | POA: Diagnosis not present

## 2019-02-10 DIAGNOSIS — E1165 Type 2 diabetes mellitus with hyperglycemia: Secondary | ICD-10-CM | POA: Diagnosis not present

## 2019-02-17 DIAGNOSIS — E1142 Type 2 diabetes mellitus with diabetic polyneuropathy: Secondary | ICD-10-CM | POA: Diagnosis not present

## 2019-02-17 DIAGNOSIS — I130 Hypertensive heart and chronic kidney disease with heart failure and stage 1 through stage 4 chronic kidney disease, or unspecified chronic kidney disease: Secondary | ICD-10-CM | POA: Diagnosis not present

## 2019-02-17 DIAGNOSIS — E1165 Type 2 diabetes mellitus with hyperglycemia: Secondary | ICD-10-CM | POA: Diagnosis not present

## 2019-02-17 DIAGNOSIS — Z7189 Other specified counseling: Secondary | ICD-10-CM | POA: Diagnosis not present

## 2019-02-18 NOTE — Progress Notes (Signed)
Electrophysiology Office Note Date: 02/20/2019  ID:  [name], DOB 01-05-1946, MRN [id number]  PCP: Georgianne Fickamachandran, Ajith, MD Primary Cardiologist: Shirlee LatchMcLean Electrophysiologist: Graciela HusbandsKlein  CC: Routine ICD follow-up  [name] is a 73 y.o. female seen today for Dr Graciela HusbandsKlein.  She presents today for routine electrophysiology followup.  Since last being seen in our clinic, the patient reports doing relatively well.  She is the primary caregiver for her husband which is stressful.  She denies chest pain, palpitations, dyspnea, PND, orthopnea, nausea, vomiting, dizziness, syncope, edema, weight gain, or early satiety.  She has not had ICD shocks.     Past Medical History:  Diagnosis Date  . Cholelithiases   . Coronary artery disease 02/2007   CABG  . Diabetes (HCC)   . Dyslipidemia   . HTN (hypertension)   . Hypothyroid   . PAF (paroxysmal atrial fibrillation) (HCC)    post op  . S/P CABG x 4 2008   Past Surgical History:  Procedure Laterality Date  . ABDOMINAL HYSTERECTOMY    . CARDIAC CATHETERIZATION  03/01/2007   LAD with 99% prox lesion, at birfucation of Cfx there is 99% lesion, at bifurcation of large OM1 there is 99% lesion, 70% prox lesion at ramus intermedius, 90% lesion in prox RCA (Dr. Claudia DesanctisH. Solomon) - susequent CABG  . CORONARY ARTERY BYPASS GRAFT  03/05/2007   LIMA-LAD, SVG-ramus intermediate. SVG-OM, SVG-PDA (Dr. Donata ClayVan Trigt)  . CORONARY ATHERECTOMY N/A 05/17/2017   Procedure: CORONARY ATHERECTOMY;  Surgeon: Lennette BihariKelly, Thomas A, MD;  [facility];  Service: Cardiovascular;  Laterality: N/A;  . CORONARY STENT INTERVENTION N/A 05/17/2017   Procedure: CORONARY STENT INTERVENTION;  Surgeon: Lennette BihariKelly, Thomas A, MD;  Location: [facility];  Service: Cardiovascular;  Laterality: N/A;  . ICD IMPLANT N/A 10/05/2017   Procedure: ICD IMPLANT;  Surgeon: Duke SalviaKlein, Steven C, MD;  [facility];  Service: Cardiovascular;  Laterality: N/A;  . INCONTINENCE  SURGERY  2009  . IR ANGIOGRAM SELECTIVE EACH ADDITIONAL VESSEL  01/31/2017  . IR ANGIOGRAM SELECTIVE EACH ADDITIONAL VESSEL  01/31/2017  . IR ANGIOGRAM VISCERAL SELECTIVE  01/31/2017  . IR EMBO ART  VEN HEMORR LYMPH EXTRAV  INC GUIDE ROADMAPPING  01/31/2017  . IR THORACENTESIS ASP PLEURAL SPACE W/IMG GUIDE  05/11/2017  . IR THORACENTESIS ASP PLEURAL SPACE W/IMG GUIDE  05/18/2017  . IR US GUIDE VASC ACCESS RIGHT  01/31/2017  . LEFT HEART CATH AND CORS/GRAFTS ANGIOGRAPHY N/A 05/15/2017   Procedure: LEFT HEART CATH AND CORS/GRAFTS ANGIOGRAPHY;  Surgeon: Lennette BihariKelly, Thomas A, MD;  Location: [facility];  Service: Cardiovascular;  Laterality: N/A;  . LEFT HEART CATH AND CORS/GRAFTS ANGIOGRAPHY N/A 05/17/2017   Procedure: LEFT HEART CATH AND CORS/GRAFTS ANGIOGRAPHY;  Surgeon: Lennette BihariKelly, Thomas A, MD;  Location: [facility];  Service: Cardiovascular;  Laterality: N/A;  . NM MYOCAR PERF WALL MOTION  07/2011   bruce myoview - mild perfusion defect in basal inferolateral & mid inferolateral regions (attenuation artifact, but prior infarct annote be excluded)' EF 49%; low risk  . TRANSTHORACIC ECHOCARDIOGRAM  07/2011   EF 45-50%, mild septal hypokinesis, mild inferior wall hypokinesis; RV systolc function mildly reduced; borderline LA enlargement; mild MR & TR; AV mildly sclerotic; mild pulm valve regurg     Current Outpatient Medications  Medication Sig Dispense Refill  . amiodarone (PACERONE) 200 MG tablet Take 0.5 tablets (100 mg total) by mouth daily. 90 tablet 1  . apixaban (ELIQUIS) 5  MG TABS tablet Take 1 tablet (5 mg total) by mouth 2 (two) times daily. 180 tablet 3  . carvedilol (COREG) 6.25 MG tablet Take 1 tablet (6.25 mg total) by mouth 2 (two) times daily with a meal. 180 tablet 3  . cholecalciferol (VITAMIN D) 1000 UNITS tablet Take 1,000 Units by mouth 2 (two) times daily.     . Cyanocobalamin (VITAMIN B-12 PO) Take by mouth daily.    Marland Kitchen ezetimibe (ZETIA) 10 MG tablet Take 1 tablet (10 mg total)  by mouth daily. 90 tablet 3  . feeding supplement, ENSURE ENLIVE, (ENSURE ENLIVE) LIQD Take 237 mLs by mouth daily.    . ferrous sulfate 325 (65 FE) MG EC tablet Take 325 mg by mouth daily.    . insulin aspart (NOVOLOG) 100 UNIT/ML injection Inject 0-15 Units into the skin 3 (three) times daily with meals. 10 mL 11  . insulin glargine (LANTUS) 100 UNIT/ML injection Inject 0.06 mLs (6 Units total) into the skin at bedtime. 10 mL 11  . levothyroxine (SYNTHROID, LEVOTHROID) 50 MCG tablet TAKE 1 TABLET EVERY DAY 90 tablet 3  . losartan (COZAAR) 25 MG tablet TAKE 1 TABLET EVERY EVENING 90 tablet 3  . potassium chloride SA (K-DUR,KLOR-CON) 20 MEQ tablet Take 1 tablet (20 mEq total) by mouth daily. 90 tablet 3  . simvastatin (ZOCOR) 40 MG tablet Take 1 tablet (40 mg total) by mouth at bedtime. 90 tablet 3  . torsemide (DEMADEX) 20 MG tablet Take 20 mg by mouth daily.     Marland Kitchen venlafaxine XR (EFFEXOR-XR) 150 MG 24 hr capsule Take 1 capsule (150 mg total) by mouth daily. 90 capsule 3  . vitamin C (ASCORBIC ACID) 500 MG tablet Take 500 mg by mouth daily.     No current facility-administered medications for this visit.     Allergies:   Crestor [rosuvastatin] and Lipitor [atorvastatin]   Social History: Social History   Socioeconomic History  . Marital status: Married    Spouse name: Not on file  . Number of children: 1  . Years of education: 76  . Highest education level: Not on file  Occupational History    Employer: [employer]  . Financial resource strain: Not hard at all  . Food insecurity    Worry: Never true    Inability: Never true  . Transportation needs    Medical: Yes    Non-medical: Yes  Tobacco Use  . Smoking status: Never Smoker  . Smokeless tobacco: Never Used  Substance and Sexual Activity  . Alcohol use: No  . Drug use: No  . Sexual activity: Never    Birth control/protection: Abstinence  Lifestyle  . Physical activity    Days per week: Not on file     Minutes per session: Not on file  . Stress: Not on file  Relationships  . Social Herbalist on phone: Not on file    Gets together: Not on file    Attends religious service: Not on file    Active member of club or organization: Not on file    Attends meetings of clubs or organizations: Not on file    Relationship status: Not on file  . Intimate partner violence    Fear of current or ex partner: Not on file    Emotionally abused: Not on file    Physically abused: Not on file    Forced sexual activity: Not on file  Other Topics Concern  .  Not on file  Social History Narrative  . Not on file    Family History: Family History  Problem Relation Age of Onset  . Diabetes Mother 46  . Heart disease Mother   . Hyperlipidemia Mother   . Cancer Father   . Heart attack Father 71  . Cancer Maternal Grandmother   . Cancer Paternal Grandmother   . Thyroid disease Sister   . Diabetes Child     Review of Systems: All other systems reviewed and are otherwise negative except as noted above.   Physical Exam: VS:  BP 110/62   Pulse (!) 52   Ht 5\' 2"  (1.575 m)   Wt 133 lb 6.4 oz (60.5 kg)   SpO2 99%   BMI 24.40 kg/m  , BMI Body mass index is 24.4 kg/m.  GEN- The patient is well appearing, alert and oriented x 3 today.   HEENT: normocephalic, atraumatic; sclera clear, conjunctiva pink; hearing intact; oropharynx clear; neck supple  Lungs- Clear to ausculation bilaterally, normal work of breathing.  No wheezes, rales, rhonchi Heart- Regular rate and rhythm  GI- soft, non-tender, non-distended, bowel sounds present  Extremities- no clubbing, cyanosis, or edema  MS- no significant deformity or atrophy Skin- warm and dry, no rash or lesion; ICD pocket well healed Psych- euthymic mood, full affect Neuro- strength and sensation are intact  ICD interrogation- reviewed in detail today,  See PACEART report  EKG:  EKG is not ordered today.  Recent Labs: 02/27/2018:  Hemoglobin 10.2; Platelets 308 05/17/2018: TSH 6.517 05/31/2018: ALT 38 07/01/2018: BUN 31; Creatinine, Ser 1.39; Potassium 4.6; Sodium 139   Wt Readings from Last 3 Encounters:  02/20/19 133 lb 6.4 oz (60.5 kg)  09/09/18 129 lb (58.5 kg)  06/10/18 137 lb 12.8 oz (62.5 kg)     Other studies Reviewed: Additional studies/ records that were reviewed today include: Dr 08/09/18 and Dr Graciela Husbands office notes   Assessment and Plan:  1.  Chronic systolic dysfunction euvolemic today Stable on an appropriate medical regimen Normal ICD function See Pace Art report No changes today  2.  Paroxysmal atrial fibrillation Burden by device interrogation 10%, not all episodes are true AF Continue Eliquis for CHADS2VASC of 5 Continue low dose Amiodarone Labs checked recently by PCP   3.  Orthostatic hypotension Stable No change required today Limits medical therapy    Current medicines are reviewed at length with the patient today.   The patient does not have concerns regarding her medicines.  The following changes were made today:  none  Labs/ tests ordered today include:   No orders of the defined types were placed in this encounter.    Disposition:   Follow up with [facility], Dr Alford Highland as scheduled, Dr Shirlee Latch 1 year     Signed, Graciela Husbands, NP 02/20/2019 11:58 AM  [facility] [address] [place] [phone] (office) [phone] (fax)

## 2019-02-20 ENCOUNTER — Other Ambulatory Visit: Payer: Self-pay

## 2019-02-20 ENCOUNTER — Encounter: Payer: Self-pay | Admitting: Nurse Practitioner

## 2019-02-20 ENCOUNTER — Ambulatory Visit: Payer: Medicare HMO | Admitting: Nurse Practitioner

## 2019-02-20 VITALS — BP 110/62 | HR 52 | Ht 62.0 in | Wt 133.4 lb

## 2019-02-20 DIAGNOSIS — I480 Paroxysmal atrial fibrillation: Secondary | ICD-10-CM

## 2019-02-20 DIAGNOSIS — I5022 Chronic systolic (congestive) heart failure: Secondary | ICD-10-CM

## 2019-02-20 DIAGNOSIS — I951 Orthostatic hypotension: Secondary | ICD-10-CM | POA: Diagnosis not present

## 2019-02-20 LAB — CUP PACEART INCLINIC DEVICE CHECK
Date Time Interrogation Session: 20201015115828
Implantable Lead Implant Date: 20190531
Implantable Lead Location: 753860
Implantable Pulse Generator Implant Date: 20190531

## 2019-02-20 NOTE — Patient Instructions (Signed)
Medication Instructions:  NONE *If you need a refill on your cardiac medications before your next appointment, please call your pharmacy*  Lab Work: NONE If you have labs (blood work) drawn today and your tests are completely normal, you will receive your results only by: Marland Kitchen MyChart Message (if you have MyChart) OR . A paper copy in the mail If you have any lab test that is abnormal or we need to change your treatment, we will call you to review the results.  Testing/Procedures: NONE  Follow-Up: At [facility], you and your health needs are our priority.  As part of our continuing mission to provide you with exceptional heart care, we have created designated Provider Care Teams.  These Care Teams include your primary Cardiologist (physician) and Advanced Practice Providers (APPs -  Physician Assistants and Nurse Practitioners) who all work together to provide you with the care you need, when you need it.  Your next appointment:   12 months  The format for your next appointment:   In Person  Provider:   Dr Caryl Comes  Other Instructions Remote monitoring is used to monitor your  ICD from home. This monitoring reduces the number of office visits required to check your device to one time per year. It allows [place] to keep an eye on the functioning of your device to ensure it is working properly. You are scheduled for a device check from home on 02/27/19. You may send your transmission at any time that day. If you have a wireless device, the transmission will be sent automatically. After your physician reviews your transmission, you will receive a postcard with your next transmission date.

## 2019-02-27 ENCOUNTER — Ambulatory Visit (INDEPENDENT_AMBULATORY_CARE_PROVIDER_SITE_OTHER): Payer: Medicare HMO | Admitting: *Deleted

## 2019-02-27 DIAGNOSIS — I255 Ischemic cardiomyopathy: Secondary | ICD-10-CM

## 2019-02-27 DIAGNOSIS — I5022 Chronic systolic (congestive) heart failure: Secondary | ICD-10-CM

## 2019-02-27 LAB — CUP PACEART REMOTE DEVICE CHECK
Battery Remaining Longevity: 122 mo
Battery Voltage: 3.01 V
Brady Statistic RV Percent Paced: 0.08 %
Date Time Interrogation Session: 20201022052203
HighPow Impedance: 60 Ohm
Implantable Lead Implant Date: 20190531
Implantable Lead Location: 753860
Implantable Pulse Generator Implant Date: 20190531
Lead Channel Impedance Value: 342 Ohm
Lead Channel Impedance Value: 456 Ohm
Lead Channel Pacing Threshold Amplitude: 0.75 V
Lead Channel Pacing Threshold Pulse Width: 0.4 ms
Lead Channel Sensing Intrinsic Amplitude: 5.875 mV
Lead Channel Sensing Intrinsic Amplitude: 5.875 mV
Lead Channel Setting Pacing Amplitude: 2.5 V
Lead Channel Setting Pacing Pulse Width: 0.4 ms
Lead Channel Setting Sensing Sensitivity: 0.3 mV

## 2019-03-07 ENCOUNTER — Telehealth (HOSPITAL_COMMUNITY): Payer: Self-pay | Admitting: Pharmacist

## 2019-03-07 NOTE — Telephone Encounter (Signed)
Advanced Heart Failure Patient Advocate Encounter  Prior Authorization for Simvastatin has been approved.    Effective dates: 03/07/19 through 05/07/20   Audry Riles, PharmD, BCPS, CPP Heart Failure Clinic Pharmacist [phone]

## 2019-03-11 NOTE — Progress Notes (Signed)
Remote ICD transmission.   

## 2019-03-21 ENCOUNTER — Other Ambulatory Visit (HOSPITAL_COMMUNITY): Payer: Self-pay | Admitting: Adult Health

## 2019-03-25 ENCOUNTER — Other Ambulatory Visit: Payer: Self-pay | Admitting: Internal Medicine

## 2019-04-30 ENCOUNTER — Other Ambulatory Visit: Payer: Self-pay | Admitting: Internal Medicine

## 2019-05-01 NOTE — Telephone Encounter (Signed)
Rx has been sent to the pharmacy electronically. ° °

## 2019-05-29 ENCOUNTER — Ambulatory Visit (INDEPENDENT_AMBULATORY_CARE_PROVIDER_SITE_OTHER): Payer: Medicare HMO | Admitting: *Deleted

## 2019-05-29 DIAGNOSIS — I255 Ischemic cardiomyopathy: Secondary | ICD-10-CM

## 2019-05-29 LAB — CUP PACEART REMOTE DEVICE CHECK
Battery Remaining Longevity: 120 mo
Battery Voltage: 3.01 V
Brady Statistic RV Percent Paced: 0.02 %
Date Time Interrogation Session: 20210121031710
HighPow Impedance: 63 Ohm
Implantable Lead Implant Date: 20190531
Implantable Lead Location: 753860
Implantable Pulse Generator Implant Date: 20190531
Lead Channel Impedance Value: 323 Ohm
Lead Channel Impedance Value: 380 Ohm
Lead Channel Pacing Threshold Amplitude: 0.875 V
Lead Channel Pacing Threshold Pulse Width: 0.4 ms
Lead Channel Sensing Intrinsic Amplitude: 5.625 mV
Lead Channel Sensing Intrinsic Amplitude: 5.625 mV
Lead Channel Setting Pacing Amplitude: 2.5 V
Lead Channel Setting Pacing Pulse Width: 0.4 ms
Lead Channel Setting Sensing Sensitivity: 0.3 mV

## 2019-06-24 DIAGNOSIS — E1165 Type 2 diabetes mellitus with hyperglycemia: Secondary | ICD-10-CM | POA: Diagnosis not present

## 2019-06-24 DIAGNOSIS — G609 Hereditary and idiopathic neuropathy, unspecified: Secondary | ICD-10-CM | POA: Diagnosis not present

## 2019-06-24 DIAGNOSIS — I130 Hypertensive heart and chronic kidney disease with heart failure and stage 1 through stage 4 chronic kidney disease, or unspecified chronic kidney disease: Secondary | ICD-10-CM | POA: Diagnosis not present

## 2019-06-24 DIAGNOSIS — E1142 Type 2 diabetes mellitus with diabetic polyneuropathy: Secondary | ICD-10-CM | POA: Diagnosis not present

## 2019-06-24 DIAGNOSIS — I480 Paroxysmal atrial fibrillation: Secondary | ICD-10-CM | POA: Diagnosis not present

## 2019-06-24 DIAGNOSIS — N183 Chronic kidney disease, stage 3 unspecified: Secondary | ICD-10-CM | POA: Diagnosis not present

## 2019-07-01 DIAGNOSIS — E1165 Type 2 diabetes mellitus with hyperglycemia: Secondary | ICD-10-CM | POA: Diagnosis not present

## 2019-07-01 DIAGNOSIS — Z0000 Encounter for general adult medical examination without abnormal findings: Secondary | ICD-10-CM | POA: Diagnosis not present

## 2019-07-01 DIAGNOSIS — E1122 Type 2 diabetes mellitus with diabetic chronic kidney disease: Secondary | ICD-10-CM | POA: Diagnosis not present

## 2019-07-01 DIAGNOSIS — E875 Hyperkalemia: Secondary | ICD-10-CM | POA: Diagnosis not present

## 2019-07-01 DIAGNOSIS — E1142 Type 2 diabetes mellitus with diabetic polyneuropathy: Secondary | ICD-10-CM | POA: Diagnosis not present

## 2019-07-01 DIAGNOSIS — N390 Urinary tract infection, site not specified: Secondary | ICD-10-CM | POA: Diagnosis not present

## 2019-07-01 DIAGNOSIS — I480 Paroxysmal atrial fibrillation: Secondary | ICD-10-CM | POA: Diagnosis not present

## 2019-07-01 DIAGNOSIS — N183 Chronic kidney disease, stage 3 unspecified: Secondary | ICD-10-CM | POA: Diagnosis not present

## 2019-07-01 DIAGNOSIS — I5022 Chronic systolic (congestive) heart failure: Secondary | ICD-10-CM | POA: Diagnosis not present

## 2019-09-01 ENCOUNTER — Other Ambulatory Visit (HOSPITAL_COMMUNITY): Payer: Self-pay | Admitting: Cardiology

## 2019-09-08 DIAGNOSIS — I130 Hypertensive heart and chronic kidney disease with heart failure and stage 1 through stage 4 chronic kidney disease, or unspecified chronic kidney disease: Secondary | ICD-10-CM | POA: Diagnosis not present

## 2019-09-08 DIAGNOSIS — E782 Mixed hyperlipidemia: Secondary | ICD-10-CM | POA: Diagnosis not present

## 2019-09-08 DIAGNOSIS — E1165 Type 2 diabetes mellitus with hyperglycemia: Secondary | ICD-10-CM | POA: Diagnosis not present

## 2019-09-08 DIAGNOSIS — E1142 Type 2 diabetes mellitus with diabetic polyneuropathy: Secondary | ICD-10-CM | POA: Diagnosis not present

## 2019-09-26 ENCOUNTER — Ambulatory Visit (INDEPENDENT_AMBULATORY_CARE_PROVIDER_SITE_OTHER): Payer: Medicare HMO | Admitting: *Deleted

## 2019-09-26 DIAGNOSIS — I255 Ischemic cardiomyopathy: Secondary | ICD-10-CM

## 2019-09-26 DIAGNOSIS — I5022 Chronic systolic (congestive) heart failure: Secondary | ICD-10-CM

## 2019-09-29 ENCOUNTER — Ambulatory Visit (INDEPENDENT_AMBULATORY_CARE_PROVIDER_SITE_OTHER): Payer: Medicare HMO | Admitting: Podiatrist

## 2019-09-29 ENCOUNTER — Ambulatory Visit (INDEPENDENT_AMBULATORY_CARE_PROVIDER_SITE_OTHER): Payer: Medicare HMO

## 2019-09-29 ENCOUNTER — Other Ambulatory Visit: Payer: Self-pay

## 2019-09-29 VITALS — Temp 97.5°F

## 2019-09-29 DIAGNOSIS — E0840 Diabetes mellitus due to underlying condition with diabetic neuropathy, unspecified: Secondary | ICD-10-CM | POA: Diagnosis not present

## 2019-09-29 DIAGNOSIS — M79671 Pain in right foot: Secondary | ICD-10-CM

## 2019-09-29 DIAGNOSIS — L603 Nail dystrophy: Secondary | ICD-10-CM | POA: Diagnosis not present

## 2019-09-29 DIAGNOSIS — L84 Corns and callosities: Secondary | ICD-10-CM

## 2019-09-29 DIAGNOSIS — Q828 Other specified congenital malformations of skin: Secondary | ICD-10-CM | POA: Diagnosis not present

## 2019-09-29 LAB — CUP PACEART REMOTE DEVICE CHECK
Battery Remaining Longevity: 117 mo
Battery Voltage: 3.01 V
Brady Statistic RV Percent Paced: 0.04 %
Date Time Interrogation Session: 20210522221046
HighPow Impedance: 53 Ohm
Implantable Lead Implant Date: 20190531
Implantable Lead Location: 753860
Implantable Pulse Generator Implant Date: 20190531
Lead Channel Impedance Value: 304 Ohm
Lead Channel Impedance Value: 342 Ohm
Lead Channel Pacing Threshold Amplitude: 1 V
Lead Channel Pacing Threshold Pulse Width: 0.4 ms
Lead Channel Sensing Intrinsic Amplitude: 6.25 mV
Lead Channel Sensing Intrinsic Amplitude: 6.25 mV
Lead Channel Setting Pacing Amplitude: 2.5 V
Lead Channel Setting Pacing Pulse Width: 0.4 ms
Lead Channel Setting Sensing Sensitivity: 0.3 mV

## 2019-09-29 NOTE — Progress Notes (Signed)
Remote ICD transmission.   

## 2019-09-29 NOTE — Patient Instructions (Signed)
Diabetes Mellitus and Foot Care Foot care is an important part of your health, especially when you have diabetes. Diabetes may cause you to have problems because of poor blood flow (circulation) to your feet and legs, which can cause your skin to:  Become thinner and drier.  Break more easily.  Heal more slowly.  Peel and crack. You may also have nerve damage (neuropathy) in your legs and feet, causing decreased feeling in them. This means that you may not notice minor injuries to your feet that could lead to more serious problems. Noticing and addressing any potential problems early is the best way to prevent future foot problems. How to care for your feet Foot hygiene  Wash your feet daily with warm water and mild soap. Do not use hot water. Then, pat your feet and the areas between your toes until they are completely dry. Do not soak your feet as this can dry your skin.  Trim your toenails straight across. Do not dig under them or around the cuticle. File the edges of your nails with an emery board or nail file.  Apply a moisturizing lotion or petroleum jelly to the skin on your feet and to dry, brittle toenails. Use lotion that does not contain alcohol and is unscented. Do not apply lotion between your toes. Shoes and socks  Wear clean socks or stockings every day. Make sure they are not too tight. Do not wear knee-high stockings since they may decrease blood flow to your legs.  Wear shoes that fit properly and have enough cushioning. Always look in your shoes before you put them on to be sure there are no objects inside.  To break in new shoes, wear them for just a few hours a day. This prevents injuries on your feet. Wounds, scrapes, corns, and calluses  Check your feet daily for blisters, cuts, bruises, sores, and redness. If you cannot see the bottom of your feet, use a mirror or ask someone for help.  Do not cut corns or calluses or try to remove them with medicine.  If you  find a minor scrape, cut, or break in the skin on your feet, keep it and the skin around it clean and dry. You may clean these areas with mild soap and water. Do not clean the area with peroxide, alcohol, or iodine.  If you have a wound, scrape, corn, or callus on your foot, look at it several times a day to make sure it is healing and not infected. Check for: ? Redness, swelling, or pain. ? Fluid or blood. ? Warmth. ? Pus or a bad smell. General instructions  Do not cross your legs. This may decrease blood flow to your feet.  Do not use heating pads or hot water bottles on your feet. They may burn your skin. If you have lost feeling in your feet or legs, you may not know this is happening until it is too late.  Protect your feet from hot and cold by wearing shoes, such as at the beach or on hot pavement.  Schedule a complete foot exam at least once a year (annually) or more often if you have foot problems. If you have foot problems, report any cuts, sores, or bruises to your health care provider immediately. Contact a health care provider if:  You have a medical condition that increases your risk of infection and you have any cuts, sores, or bruises on your feet.  You have an injury that is not   healing.  You have redness on your legs or feet.  You feel burning or tingling in your legs or feet.  You have pain or cramps in your legs and feet.  Your legs or feet are numb.  Your feet always feel cold.  You have pain around a toenail. Get help right away if:  You have a wound, scrape, corn, or callus on your foot and: ? You have pain, swelling, or redness that gets worse. ? You have fluid or blood coming from the wound, scrape, corn, or callus. ? Your wound, scrape, corn, or callus feels warm to the touch. ? You have pus or a bad smell coming from the wound, scrape, corn, or callus. ? You have a fever. ? You have a red line going up your leg. Summary  Check your feet every day  for cuts, sores, red spots, swelling, and blisters.  Moisturize feet and legs daily.  Wear shoes that fit properly and have enough cushioning.  If you have foot problems, report any cuts, sores, or bruises to your health care provider immediately.  Schedule a complete foot exam at least once a year (annually) or more often if you have foot problems. This information is not intended to replace advice given to you by your health care provider. Make sure you discuss any questions you have with your health care provider. Document Revised: 01/15/2019 Document Reviewed: 05/26/2016 Elsevier Patient Education  2020 Elsevier Inc.  

## 2019-10-03 ENCOUNTER — Encounter: Payer: Self-pay | Admitting: Podiatrist

## 2019-10-03 NOTE — Progress Notes (Signed)
Chief Complaint  Patient presents with  . Callouses    R plantar forefoot submet 1. Previous appt = 2019.  Marland Kitchen Foot Problem    R plantar forefoot - submet 1 and between 1st and 2nd toe. x1+ yr. Pt stated, "The skin split open. It eventually healed, but the area still hurts". 7/10. No current bleeding or drainage. ? Contusion.  . Foot Pain    R hallux. Pt stated, "It still hurts. I had a fracture years ago".  . Diabetes    Most recent HgbA1c per pt = 8. Pt stated, "I don't check my [glucose], but I'm trying to do better with my diet".  . Nail Problem    Thick, long toenails. ? Ingrown R hallux, lateral border.     HPI: Patient is 74 y.o. female who presents today for the concerns as listed above.  The patient relates she has neuropathy and she takes Eliquis.  She also relates a history of a right hallux fracture and states she has a blister that healed on her right plantar forefoot she would like checked.  She relates pain in this area.    Review of Systems No fevers, chills, nausea, muscle aches, no difficulty breathing, no calf pain, no chest pain or shortness of breath.   Physical Exam  GENERAL APPEARANCE: Alert, conversant. Appropriately groomed. No acute distress.   VASCULAR: Pedal pulses palpable DP and PT bilateral.  Capillary refill time is immediate to all digits,  Proximal to distal cooling it warm to warm.  Digital hair growth is absent bilateral.  Mild edema noted.   NEUROLOGIC: sensation is decreased epicritically and protectively to 5.07 monofilament at 0/5 sites bilateral.  Light touch is intact bilateral, vibratory sensation absent bilateral  MUSCULOSKELETAL: acceptable muscle strength, tone and stability bilateral. Hallux malleus noted right foot.  DERMATOLOGIC: skin is warm, supple, and dry.Right foot shows Evidence of a submet 1 blister that spread distally between the dermal layers distally between the first and second toes and once debrided reveals intact dermis and  a healed lesion.,  No redness, no swelling, no sign of infection present.   Digital Nails are long, discolored, thickened and brittle x 10.    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  Lateral deviation of the hallux is also noted and a mild bunion deformity is noted.  No sign of infection and no emphysema noted in the soft tissues  Assessment     ICD-10-CM   1. Right foot pain  M79.671 DG Foot Complete Right  2. Pre-ulcerative calluses  L84   3. Nail dystrophy  L60.3   4. Diabetes mellitus due to underlying condition with diabetic neuropathy, unspecified whether long term insulin use (HCC)  E08.40      Plan  -Discussed treatment options and alternatives.  Debrided the hemorrhagic tissue to an originating blister plantar forefoot submet 1 right foot.  Applied antibiotic ointment and a dressing and recommended the patient use antibiotic ointment and cover for the next week.  If she notices any drainage, blood, any redness or swelling of this foot she will call immediately.   -Discussed xray findings and the right hallux does not appear to be fractured-  She does have some arthritic changes due to the hallux malleus  -Debrided her toenails in length and thickness x 10 .  She should return for at risk foot care due to her diabetes and peripheral neuropathy.  She is to return in 3 months for  continued foot care.

## 2019-10-28 ENCOUNTER — Inpatient Hospital Stay (HOSPITAL_COMMUNITY)
Admission: EM | Admit: 2019-10-28 | Discharge: 2019-11-01 | DRG: 623 | Disposition: A | Discharge status: Home / Self Care | Payer: Medicare HMO | Attending: Internal Medicine | Admitting: Internal Medicine

## 2019-10-28 ENCOUNTER — Encounter (HOSPITAL_COMMUNITY): Payer: Self-pay

## 2019-10-28 DIAGNOSIS — Z79890 Hormone replacement therapy (postmenopausal): Secondary | ICD-10-CM | POA: Diagnosis not present

## 2019-10-28 DIAGNOSIS — I447 Left bundle-branch block, unspecified: Secondary | ICD-10-CM | POA: Diagnosis not present

## 2019-10-28 DIAGNOSIS — N1832 Chronic kidney disease, stage 3b: Secondary | ICD-10-CM | POA: Diagnosis present

## 2019-10-28 DIAGNOSIS — Z90710 Acquired absence of both cervix and uterus: Secondary | ICD-10-CM | POA: Diagnosis not present

## 2019-10-28 DIAGNOSIS — R600 Localized edema: Secondary | ICD-10-CM | POA: Diagnosis not present

## 2019-10-28 DIAGNOSIS — R52 Pain, unspecified: Secondary | ICD-10-CM | POA: Diagnosis not present

## 2019-10-28 DIAGNOSIS — E11621 Type 2 diabetes mellitus with foot ulcer: Secondary | ICD-10-CM | POA: Diagnosis not present

## 2019-10-28 DIAGNOSIS — Z833 Family history of diabetes mellitus: Secondary | ICD-10-CM | POA: Diagnosis not present

## 2019-10-28 DIAGNOSIS — L03115 Cellulitis of right lower limb: Secondary | ICD-10-CM | POA: Diagnosis present

## 2019-10-28 DIAGNOSIS — E785 Hyperlipidemia, unspecified: Secondary | ICD-10-CM | POA: Diagnosis present

## 2019-10-28 DIAGNOSIS — E11628 Type 2 diabetes mellitus with other skin complications: Secondary | ICD-10-CM | POA: Diagnosis present

## 2019-10-28 DIAGNOSIS — R509 Fever, unspecified: Secondary | ICD-10-CM | POA: Diagnosis not present

## 2019-10-28 DIAGNOSIS — Z95810 Presence of automatic (implantable) cardiac defibrillator: Secondary | ICD-10-CM | POA: Diagnosis not present

## 2019-10-28 DIAGNOSIS — I255 Ischemic cardiomyopathy: Secondary | ICD-10-CM | POA: Diagnosis present

## 2019-10-28 DIAGNOSIS — Z20822 Contact with and (suspected) exposure to covid-19: Secondary | ICD-10-CM | POA: Diagnosis present

## 2019-10-28 DIAGNOSIS — B961 Klebsiella pneumoniae [K. pneumoniae] as the cause of diseases classified elsewhere: Secondary | ICD-10-CM | POA: Diagnosis present

## 2019-10-28 DIAGNOSIS — E1151 Type 2 diabetes mellitus with diabetic peripheral angiopathy without gangrene: Secondary | ICD-10-CM | POA: Diagnosis present

## 2019-10-28 DIAGNOSIS — E11649 Type 2 diabetes mellitus with hypoglycemia without coma: Secondary | ICD-10-CM | POA: Diagnosis not present

## 2019-10-28 DIAGNOSIS — Z79899 Other long term (current) drug therapy: Secondary | ICD-10-CM

## 2019-10-28 DIAGNOSIS — Z7901 Long term (current) use of anticoagulants: Secondary | ICD-10-CM

## 2019-10-28 DIAGNOSIS — I5042 Chronic combined systolic (congestive) and diastolic (congestive) heart failure: Secondary | ICD-10-CM | POA: Diagnosis present

## 2019-10-28 DIAGNOSIS — R0689 Other abnormalities of breathing: Secondary | ICD-10-CM | POA: Diagnosis not present

## 2019-10-28 DIAGNOSIS — M7989 Other specified soft tissue disorders: Secondary | ICD-10-CM | POA: Diagnosis not present

## 2019-10-28 DIAGNOSIS — Z955 Presence of coronary angioplasty implant and graft: Secondary | ICD-10-CM

## 2019-10-28 DIAGNOSIS — E039 Hypothyroidism, unspecified: Secondary | ICD-10-CM | POA: Diagnosis present

## 2019-10-28 DIAGNOSIS — L0390 Cellulitis, unspecified: Secondary | ICD-10-CM | POA: Diagnosis not present

## 2019-10-28 DIAGNOSIS — L97519 Non-pressure chronic ulcer of other part of right foot with unspecified severity: Secondary | ICD-10-CM | POA: Diagnosis not present

## 2019-10-28 DIAGNOSIS — I10 Essential (primary) hypertension: Secondary | ICD-10-CM | POA: Diagnosis present

## 2019-10-28 DIAGNOSIS — I2510 Atherosclerotic heart disease of native coronary artery without angina pectoris: Secondary | ICD-10-CM | POA: Diagnosis present

## 2019-10-28 DIAGNOSIS — N390 Urinary tract infection, site not specified: Secondary | ICD-10-CM | POA: Diagnosis present

## 2019-10-28 DIAGNOSIS — L02611 Cutaneous abscess of right foot: Secondary | ICD-10-CM | POA: Diagnosis present

## 2019-10-28 DIAGNOSIS — E1165 Type 2 diabetes mellitus with hyperglycemia: Secondary | ICD-10-CM | POA: Diagnosis present

## 2019-10-28 DIAGNOSIS — B9562 Methicillin resistant Staphylococcus aureus infection as the cause of diseases classified elsewhere: Secondary | ICD-10-CM | POA: Diagnosis present

## 2019-10-28 DIAGNOSIS — Z951 Presence of aortocoronary bypass graft: Secondary | ICD-10-CM | POA: Diagnosis not present

## 2019-10-28 DIAGNOSIS — Z794 Long term (current) use of insulin: Secondary | ICD-10-CM

## 2019-10-28 DIAGNOSIS — L089 Local infection of the skin and subcutaneous tissue, unspecified: Secondary | ICD-10-CM | POA: Diagnosis not present

## 2019-10-28 DIAGNOSIS — IMO0002 Reserved for concepts with insufficient information to code with codable children: Secondary | ICD-10-CM | POA: Diagnosis present

## 2019-10-28 DIAGNOSIS — I25810 Atherosclerosis of coronary artery bypass graft(s) without angina pectoris: Secondary | ICD-10-CM | POA: Diagnosis not present

## 2019-10-28 DIAGNOSIS — A4902 Methicillin resistant Staphylococcus aureus infection, unspecified site: Secondary | ICD-10-CM | POA: Diagnosis present

## 2019-10-28 DIAGNOSIS — R0902 Hypoxemia: Secondary | ICD-10-CM | POA: Diagnosis not present

## 2019-10-28 DIAGNOSIS — I130 Hypertensive heart and chronic kidney disease with heart failure and stage 1 through stage 4 chronic kidney disease, or unspecified chronic kidney disease: Secondary | ICD-10-CM | POA: Diagnosis present

## 2019-10-28 DIAGNOSIS — B351 Tinea unguium: Secondary | ICD-10-CM | POA: Diagnosis present

## 2019-10-28 DIAGNOSIS — I480 Paroxysmal atrial fibrillation: Secondary | ICD-10-CM | POA: Diagnosis present

## 2019-10-28 DIAGNOSIS — E1122 Type 2 diabetes mellitus with diabetic chronic kidney disease: Secondary | ICD-10-CM | POA: Diagnosis present

## 2019-10-28 DIAGNOSIS — I5022 Chronic systolic (congestive) heart failure: Secondary | ICD-10-CM | POA: Diagnosis not present

## 2019-10-28 LAB — URINALYSIS, ROUTINE W REFLEX MICROSCOPIC
Bilirubin Urine: NEGATIVE
Glucose, UA: NEGATIVE mg/dL
Ketones, ur: NEGATIVE mg/dL
Nitrite: NEGATIVE
Protein, ur: NEGATIVE mg/dL
Specific Gravity, Urine: 1.009 (ref 1.005–1.030)
pH: 5 (ref 5.0–8.0)

## 2019-10-28 LAB — COMPREHENSIVE METABOLIC PANEL
ALT: 31 U/L (ref 0–44)
AST: 49 U/L — ABNORMAL HIGH (ref 15–41)
Albumin: 3.6 g/dL (ref 3.5–5.0)
Alkaline Phosphatase: 112 U/L (ref 38–126)
Anion gap: 11 (ref 5–15)
BUN: 21 mg/dL (ref 8–23)
CO2: 25 mmol/L (ref 22–32)
Calcium: 8.7 mg/dL — ABNORMAL LOW (ref 8.9–10.3)
Chloride: 98 mmol/L (ref 98–111)
Creatinine, Ser: 1.41 mg/dL — ABNORMAL HIGH (ref 0.44–1.00)
GFR calc Af Amer: 42 mL/min — ABNORMAL LOW (ref >60)
GFR calc non Af Amer: 36 mL/min — ABNORMAL LOW (ref >60)
Glucose, Bld: 271 mg/dL — ABNORMAL HIGH (ref 70–99)
Potassium: 3.6 mmol/L (ref 3.5–5.1)
Sodium: 134 mmol/L — ABNORMAL LOW (ref 135–145)
Total Bilirubin: 0.6 mg/dL (ref 0.3–1.2)
Total Protein: 7.2 g/dL (ref 6.5–8.1)

## 2019-10-28 LAB — CBC WITH DIFFERENTIAL/PLATELET
Abs Immature Granulocytes: 0.09 10*3/uL — ABNORMAL HIGH (ref 0.00–0.07)
Basophils Absolute: 0.1 10*3/uL (ref 0.0–0.1)
Basophils Relative: 0 %
Eosinophils Absolute: 0.1 10*3/uL (ref 0.0–0.5)
Eosinophils Relative: 1 %
HCT: 35.5 % — ABNORMAL LOW (ref 36.0–46.0)
Hemoglobin: 10.9 g/dL — ABNORMAL LOW (ref 12.0–15.0)
Immature Granulocytes: 1 %
Lymphocytes Relative: 5 %
Lymphs Abs: 0.9 10*3/uL (ref 0.7–4.0)
MCH: 28.8 pg (ref 26.0–34.0)
MCHC: 30.7 g/dL (ref 30.0–36.0)
MCV: 93.7 fL (ref 80.0–100.0)
Monocytes Absolute: 1 10*3/uL (ref 0.1–1.0)
Monocytes Relative: 6 %
Neutro Abs: 14.9 10*3/uL — ABNORMAL HIGH (ref 1.7–7.7)
Neutrophils Relative %: 87 %
Platelets: 287 10*3/uL (ref 150–400)
RBC: 3.79 MIL/uL — ABNORMAL LOW (ref 3.87–5.11)
RDW: 13 % (ref 11.5–15.5)
WBC: 17.1 10*3/uL — ABNORMAL HIGH (ref 4.0–10.5)
nRBC: 0 % (ref 0.0–0.2)

## 2019-10-28 LAB — LACTIC ACID, PLASMA: Lactic Acid, Venous: 1.6 mmol/L (ref 0.5–1.9)

## 2019-10-28 MED ORDER — SODIUM CHLORIDE 0.9% FLUSH: 3.0000 mL | Freq: Once | INTRAVENOUS | Status: DC

## 2019-10-28 MED ORDER — ACETAMINOPHEN 325 MG PO TABS
650.0000 mg | ORAL_TABLET | Freq: Once | ORAL | Status: AC
  Administered 2019-10-28: 650 mg via ORAL
  Filled 2019-10-28: qty 2

## 2019-10-28 NOTE — ED Triage Notes (Signed)
Pt comes via [facility] from home for infection on the top of her foot that she has been soaking since Sunday, possible fevers at home

## 2019-10-29 ENCOUNTER — Encounter (HOSPITAL_COMMUNITY): Payer: Self-pay | Admitting: Internal Medicine

## 2019-10-29 ENCOUNTER — Emergency Department (HOSPITAL_COMMUNITY): Payer: Medicare HMO

## 2019-10-29 ENCOUNTER — Telehealth: Payer: Self-pay | Admitting: Podiatry

## 2019-10-29 ENCOUNTER — Encounter (HOSPITAL_COMMUNITY): Payer: Medicare HMO

## 2019-10-29 ENCOUNTER — Inpatient Hospital Stay (HOSPITAL_COMMUNITY): Payer: Medicare HMO

## 2019-10-29 DIAGNOSIS — L0390 Cellulitis, unspecified: Secondary | ICD-10-CM | POA: Diagnosis not present

## 2019-10-29 DIAGNOSIS — E11628 Type 2 diabetes mellitus with other skin complications: Principal | ICD-10-CM

## 2019-10-29 DIAGNOSIS — I5022 Chronic systolic (congestive) heart failure: Secondary | ICD-10-CM | POA: Diagnosis not present

## 2019-10-29 DIAGNOSIS — L089 Local infection of the skin and subcutaneous tissue, unspecified: Secondary | ICD-10-CM

## 2019-10-29 DIAGNOSIS — I25810 Atherosclerosis of coronary artery bypass graft(s) without angina pectoris: Secondary | ICD-10-CM | POA: Diagnosis not present

## 2019-10-29 DIAGNOSIS — L03115 Cellulitis of right lower limb: Secondary | ICD-10-CM | POA: Diagnosis present

## 2019-10-29 DIAGNOSIS — B961 Klebsiella pneumoniae [K. pneumoniae] as the cause of diseases classified elsewhere: Secondary | ICD-10-CM | POA: Diagnosis present

## 2019-10-29 DIAGNOSIS — Z20822 Contact with and (suspected) exposure to covid-19: Secondary | ICD-10-CM | POA: Diagnosis present

## 2019-10-29 DIAGNOSIS — E1151 Type 2 diabetes mellitus with diabetic peripheral angiopathy without gangrene: Secondary | ICD-10-CM | POA: Diagnosis present

## 2019-10-29 DIAGNOSIS — Z7901 Long term (current) use of anticoagulants: Secondary | ICD-10-CM | POA: Diagnosis not present

## 2019-10-29 DIAGNOSIS — E785 Hyperlipidemia, unspecified: Secondary | ICD-10-CM | POA: Diagnosis present

## 2019-10-29 DIAGNOSIS — Z794 Long term (current) use of insulin: Secondary | ICD-10-CM | POA: Diagnosis not present

## 2019-10-29 DIAGNOSIS — I480 Paroxysmal atrial fibrillation: Secondary | ICD-10-CM | POA: Diagnosis present

## 2019-10-29 DIAGNOSIS — Z951 Presence of aortocoronary bypass graft: Secondary | ICD-10-CM | POA: Diagnosis not present

## 2019-10-29 DIAGNOSIS — Z95810 Presence of automatic (implantable) cardiac defibrillator: Secondary | ICD-10-CM | POA: Diagnosis not present

## 2019-10-29 DIAGNOSIS — B9562 Methicillin resistant Staphylococcus aureus infection as the cause of diseases classified elsewhere: Secondary | ICD-10-CM | POA: Diagnosis present

## 2019-10-29 DIAGNOSIS — E11649 Type 2 diabetes mellitus with hypoglycemia without coma: Secondary | ICD-10-CM | POA: Diagnosis not present

## 2019-10-29 DIAGNOSIS — I5042 Chronic combined systolic (congestive) and diastolic (congestive) heart failure: Secondary | ICD-10-CM | POA: Diagnosis present

## 2019-10-29 DIAGNOSIS — Z90710 Acquired absence of both cervix and uterus: Secondary | ICD-10-CM | POA: Diagnosis not present

## 2019-10-29 DIAGNOSIS — I130 Hypertensive heart and chronic kidney disease with heart failure and stage 1 through stage 4 chronic kidney disease, or unspecified chronic kidney disease: Secondary | ICD-10-CM | POA: Diagnosis present

## 2019-10-29 DIAGNOSIS — N390 Urinary tract infection, site not specified: Secondary | ICD-10-CM | POA: Diagnosis present

## 2019-10-29 DIAGNOSIS — I10 Essential (primary) hypertension: Secondary | ICD-10-CM | POA: Diagnosis not present

## 2019-10-29 DIAGNOSIS — E039 Hypothyroidism, unspecified: Secondary | ICD-10-CM | POA: Diagnosis present

## 2019-10-29 DIAGNOSIS — Z79899 Other long term (current) drug therapy: Secondary | ICD-10-CM | POA: Diagnosis not present

## 2019-10-29 DIAGNOSIS — Z79890 Hormone replacement therapy (postmenopausal): Secondary | ICD-10-CM | POA: Diagnosis not present

## 2019-10-29 DIAGNOSIS — L02611 Cutaneous abscess of right foot: Secondary | ICD-10-CM | POA: Diagnosis present

## 2019-10-29 DIAGNOSIS — Z833 Family history of diabetes mellitus: Secondary | ICD-10-CM | POA: Diagnosis not present

## 2019-10-29 DIAGNOSIS — I2510 Atherosclerotic heart disease of native coronary artery without angina pectoris: Secondary | ICD-10-CM | POA: Diagnosis present

## 2019-10-29 DIAGNOSIS — E1122 Type 2 diabetes mellitus with diabetic chronic kidney disease: Secondary | ICD-10-CM | POA: Diagnosis present

## 2019-10-29 DIAGNOSIS — IMO0002 Reserved for concepts with insufficient information to code with codable children: Secondary | ICD-10-CM | POA: Diagnosis present

## 2019-10-29 LAB — CBG MONITORING, ED
Glucose-Capillary: 188 mg/dL — ABNORMAL HIGH (ref 70–99)
Glucose-Capillary: 157 mg/dL — ABNORMAL HIGH (ref 70–99)

## 2019-10-29 LAB — LACTIC ACID, PLASMA: Lactic Acid, Venous: 1.4 mmol/L (ref 0.5–1.9)

## 2019-10-29 LAB — HEMOGLOBIN A1C
Hgb A1c MFr Bld: 9.3 % — ABNORMAL HIGH (ref 4.8–5.6)
Mean Plasma Glucose: 220.21 mg/dL

## 2019-10-29 LAB — SARS CORONAVIRUS 2 BY RT PCR (HOSPITAL ORDER, PERFORMED IN ~~LOC~~ HOSPITAL LAB): SARS Coronavirus 2: NEGATIVE

## 2019-10-29 LAB — C-REACTIVE PROTEIN: CRP: 3.5 mg/dL — ABNORMAL HIGH (ref <1.0)

## 2019-10-29 LAB — SEDIMENTATION RATE: Sed Rate: 45 mm/hr — ABNORMAL HIGH (ref 0–22)

## 2019-10-29 LAB — PREALBUMIN: Prealbumin: 10.4 mg/dL — ABNORMAL LOW (ref 18–38)

## 2019-10-29 LAB — TSH: TSH: 2.586 u[IU]/mL (ref 0.350–4.500)

## 2019-10-29 MED ORDER — INSULIN NPH (HUMAN) (ISOPHANE) 100 UNIT/ML ~~LOC~~ SUSP
10.0000 [IU] | Freq: Every day | SUBCUTANEOUS | Status: DC
  Administered 2019-10-30 – 2019-10-31 (×2): 10 [IU] via SUBCUTANEOUS
  Filled 2019-10-29: qty 10

## 2019-10-29 MED ORDER — LEVOTHYROXINE SODIUM 50 MCG PO TABS
50.0000 ug | ORAL_TABLET | Freq: Every day | ORAL | Status: DC
  Administered 2019-10-30 – 2019-11-01 (×3): 50 ug via ORAL
  Filled 2019-10-29 (×3): qty 1

## 2019-10-29 MED ORDER — VENLAFAXINE HCL ER 75 MG PO CP24
150.0000 mg | ORAL_CAPSULE | Freq: Every day | ORAL | Status: DC
  Administered 2019-10-29 – 2019-11-01 (×4): 150 mg via ORAL
  Filled 2019-10-29 (×3): qty 2
  Filled 2019-10-29: qty 1

## 2019-10-29 MED ORDER — LOSARTAN POTASSIUM 25 MG PO TABS
25.0000 mg | ORAL_TABLET | Freq: Every day | ORAL | Status: DC
  Administered 2019-10-30 – 2019-11-01 (×3): 25 mg via ORAL
  Filled 2019-10-29 (×3): qty 1

## 2019-10-29 MED ORDER — INSULIN ASPART 100 UNIT/ML ~~LOC~~ SOLN
0.0000 [IU] | Freq: Three times a day (TID) | SUBCUTANEOUS | Status: DC
  Administered 2019-10-29: 2 [IU] via SUBCUTANEOUS
  Administered 2019-10-31: 3 [IU] via SUBCUTANEOUS

## 2019-10-29 MED ORDER — INSULIN NPH (HUMAN) (ISOPHANE) 100 UNIT/ML ~~LOC~~ SUSP
20.0000 [IU] | Freq: Every day | SUBCUTANEOUS | Status: DC
  Administered 2019-10-29 – 2019-10-30 (×2): 20 [IU] via SUBCUTANEOUS
  Filled 2019-10-29 (×2): qty 10

## 2019-10-29 MED ORDER — VANCOMYCIN HCL 1250 MG/250ML IV SOLN
1250.0000 mg | Freq: Once | INTRAVENOUS | Status: AC
  Administered 2019-10-29: 1250 mg via INTRAVENOUS
  Filled 2019-10-29: qty 250

## 2019-10-29 MED ORDER — ONDANSETRON HCL 4 MG PO TABS: 4.0000 mg | ORAL_TABLET | Freq: Four times a day (QID) | ORAL | Status: DC | PRN

## 2019-10-29 MED ORDER — SODIUM CHLORIDE 0.9 % IV SOLN
2.0000 g | INTRAVENOUS | Status: DC
  Administered 2019-10-29 – 2019-10-30 (×2): 2 g via INTRAVENOUS
  Filled 2019-10-29: qty 20
  Filled 2019-10-29: qty 2
  Filled 2019-10-29: qty 20

## 2019-10-29 MED ORDER — MORPHINE SULFATE (PF) 2 MG/ML IV SOLN: 2.0000 mg | INTRAVENOUS | Status: DC | PRN

## 2019-10-29 MED ORDER — ACETAMINOPHEN 325 MG PO TABS
650.0000 mg | ORAL_TABLET | Freq: Four times a day (QID) | ORAL | Status: DC | PRN
  Filled 2019-10-29: qty 2

## 2019-10-29 MED ORDER — SIMVASTATIN 20 MG PO TABS
40.0000 mg | ORAL_TABLET | Freq: Every day | ORAL | Status: DC
  Administered 2019-10-29 – 2019-10-31 (×3): 40 mg via ORAL
  Filled 2019-10-29 (×3): qty 2

## 2019-10-29 MED ORDER — INSULIN ASPART 100 UNIT/ML ~~LOC~~ SOLN: 0.0000 [IU] | Freq: Every day | SUBCUTANEOUS | Status: DC

## 2019-10-29 MED ORDER — LACTATED RINGERS IV SOLN: INTRAVENOUS | Status: DC

## 2019-10-29 MED ORDER — HEPARIN (PORCINE) 25000 UT/250ML-% IV SOLN
900.0000 [IU]/h | INTRAVENOUS | Status: DC
  Filled 2019-10-29: qty 250

## 2019-10-29 MED ORDER — CARVEDILOL 6.25 MG PO TABS
6.2500 mg | ORAL_TABLET | Freq: Two times a day (BID) | ORAL | Status: DC
  Administered 2019-10-29 – 2019-11-01 (×6): 6.25 mg via ORAL
  Filled 2019-10-29 (×5): qty 1
  Filled 2019-10-29: qty 2

## 2019-10-29 MED ORDER — METRONIDAZOLE IN NACL 5-0.79 MG/ML-% IV SOLN
500.0000 mg | Freq: Three times a day (TID) | INTRAVENOUS | Status: DC
  Administered 2019-10-29 – 2019-10-31 (×6): 500 mg via INTRAVENOUS
  Filled 2019-10-29 (×6): qty 100

## 2019-10-29 MED ORDER — ONDANSETRON HCL 4 MG/2ML IJ SOLN
4.0000 mg | Freq: Four times a day (QID) | INTRAMUSCULAR | Status: DC | PRN
  Administered 2019-10-31: 4 mg via INTRAVENOUS
  Filled 2019-10-29: qty 2

## 2019-10-29 MED ORDER — EZETIMIBE 10 MG PO TABS
10.0000 mg | ORAL_TABLET | Freq: Every day | ORAL | Status: DC
  Administered 2019-10-29 – 2019-11-01 (×4): 10 mg via ORAL
  Filled 2019-10-29 (×4): qty 1

## 2019-10-29 MED ORDER — AMIODARONE HCL 200 MG PO TABS
100.0000 mg | ORAL_TABLET | Freq: Every day | ORAL | Status: DC
  Administered 2019-10-30 – 2019-11-01 (×3): 100 mg via ORAL
  Filled 2019-10-29 (×3): qty 1

## 2019-10-29 MED ORDER — INSULIN NPH (HUMAN) (ISOPHANE) 100 UNIT/ML ~~LOC~~ SUSP: 10.0000 [IU] | SUBCUTANEOUS | Status: DC

## 2019-10-29 MED ORDER — ACETAMINOPHEN 650 MG RE SUPP: 650.0000 mg | Freq: Four times a day (QID) | RECTAL | Status: DC | PRN

## 2019-10-29 MED ORDER — PIPERACILLIN-TAZOBACTAM 3.375 G IVPB 30 MIN
3.3750 g | Freq: Once | INTRAVENOUS | Status: DC
  Administered 2019-10-29: 3.375 g via INTRAVENOUS
  Filled 2019-10-29: qty 50

## 2019-10-29 MED ORDER — VANCOMYCIN HCL 750 MG/150ML IV SOLN: 750.0000 mg | INTRAVENOUS | Status: DC

## 2019-10-29 MED ORDER — APIXABAN 5 MG PO TABS
5.0000 mg | ORAL_TABLET | Freq: Two times a day (BID) | ORAL | Status: DC
  Administered 2019-10-29 – 2019-11-01 (×6): 5 mg via ORAL
  Filled 2019-10-29 (×6): qty 1

## 2019-10-29 NOTE — Telephone Encounter (Signed)
[facility] has called and stated this patient needs to be seen by on call provider for diabetic foot infection. Pt it is room 19-orange zone

## 2019-10-29 NOTE — ED Notes (Signed)
Pt transported to xray 

## 2019-10-29 NOTE — H&P (Signed)
History and Physical    [name] ATJ:[id number] DOB: [birth year] DOA: 10/28/2019  PCP: Georgianne Fick, MD Consultants:  [facility] - cardiology; [place] - [facility] Patient coming from:  Home - lives with husband (she is caregiver) and granddaughter; NOK: Daughter, [name], [phone]  Chief Complaint: Diabetic foot infection  HPI: [name] is a 74 y.o. female with medical history significant of CAD s/p CABG; AICD placement; afib on Eliquis; hypothyroidism; HTN; HLD; and DM presenting with diabetic foot infection.   Her right foot swelled last weekend.  It has worsened since.  It is painful and hurts to walk on it. It is red and swollen and painful.  Yesterday, she soaked it in Epson salts and she got nauseated and had to go lie down.  They came to the ER last night - had chills, clammy.  Glucose was 269.  Her foot is "black".   ED Course:  R diabetic foot infection.  Fever 102 last night. Lactate 1.6.  Given IV abx and IVF.  [facility] consulted.  Review of Systems: As per HPI; otherwise review of systems reviewed and negative.   Ambulatory Status:  Ambulates without assistance at home, uses a cane outside the home  COVID Vaccine Status:  First shot, due for her second  Past Medical History:  Diagnosis Date  . Cholelithiases   . Coronary artery disease 02/2007   CABG x4  . Diabetes (HCC)   . Dyslipidemia   . HTN (hypertension)   . Hypothyroid   . PAF (paroxysmal atrial fibrillation) (HCC)    post op    Past Surgical History:  Procedure Laterality Date  . ABDOMINAL HYSTERECTOMY    . CARDIAC CATHETERIZATION  03/01/2007   LAD with 99% prox lesion, at birfucation of Cfx there is 99% lesion, at bifurcation of large OM1 there is 99% lesion, 70% prox lesion at ramus intermedius, 90% lesion in prox RCA (Dr. Claudia Desanctis) - susequent CABG  . CORONARY ARTERY BYPASS GRAFT  03/05/2007   LIMA-LAD, SVG-ramus intermediate. SVG-OM, SVG-PDA (Dr. Donata Clay)    . CORONARY ATHERECTOMY N/A 05/17/2017   Procedure: CORONARY ATHERECTOMY;  Surgeon: Lennette Bihari, MD;  [facility]AS[facility] CV LAB;  Service: Cardiovascular;  Laterality: N/A;  . CORONARY STENT INTERVENTION N/A 05/17/2017   Procedure: CORONARY STENT INTERVENTION;  Surgeon: Lennette Bihari, MD;  Location: [facility] INVASIVE CV LAB;  Service: Cardiovascular;  Laterality: N/A;  . ICD IMPLANT N/A 10/05/2017   Procedure: ICD IMPLANT;  Surgeon: Duke Salvia, MD;  Location: [facility] INVASIVE CV LAB;  Service: Cardiovascular;  Laterality: N/A;  . INCONTINENCE SURGERY  2009  . IR ANGIOGRAM SELECTIVE EACH ADDITIONAL VESSEL  01/31/2017  . IR ANGIOGRAM SELECTIVE EACH ADDITIONAL VESSEL  01/31/2017  . IR ANGIOGRAM VISCERAL SELECTIVE  01/31/2017  . IR EMBO ART  VEN HEMORR LYMPH EXTRAV  INC GUIDE ROADMAPPING  01/31/2017  . IR THORACENTESIS ASP PLEURAL SPACE W/IMG GUIDE  05/11/2017  . IR THORACENTESIS ASP PLEURAL SPACE W/IMG GUIDE  05/18/2017  . IR US GUIDE VASC ACCESS RIGHT  01/31/2017  . LEFT HEART CATH AND CORS/GRAFTS ANGIOGRAPHY N/A 05/15/2017   Procedure: LEFT HEART CATH AND CORS/GRAFTS ANGIOGRAPHY;  Surgeon: Lennette Bihari, MD;  Location: [facility] INVASIVE CV LAB;  Service: Cardiovascular;  Laterality: N/A;  . LEFT HEART CATH AND CORS/GRAFTS ANGIOGRAPHY N/A 05/17/2017   Procedure: LEFT HEART CATH AND CORS/GRAFTS ANGIOGRAPHY;  Surgeon: Lennette Bihari, MD;  Location: [facility] INVASIVE CV LAB;  Service:  Cardiovascular;  Laterality: N/A;  . NM MYOCAR PERF WALL MOTION  07/2011   bruce myoview - mild perfusion defect in basal inferolateral & mid inferolateral regions (attenuation artifact, but prior infarct annote be excluded)' EF 49%; low risk  . TRANSTHORACIC ECHOCARDIOGRAM  07/2011   EF 45-50%, mild septal hypokinesis, mild inferior wall hypokinesis; RV systolc function mildly reduced; borderline LA enlargement; mild MR & TR; AV mildly sclerotic; mild pulm valve regurg     Social History   Socioeconomic History  . Marital status:  Married    Spouse name: Not on file  . Number of children: 1  . Years of education: 34  . Highest education level: Not on file  Occupational History  . Occupation: retired    Fish farm manager: [employer]  Tobacco Use  . Smoking status: Never Smoker  . Smokeless tobacco: Never Used  Vaping Use  . Vaping Use: Never used  Substance and Sexual Activity  . Alcohol use: No  . Drug use: No  . Sexual activity: Never    Birth control/protection: Abstinence  Other Topics Concern  . Not on file  Social History Narrative  . Not on file   Social Determinants of Health   Financial Resource Strain:   . Difficulty of Paying Living Expenses:   Food Insecurity:   . Worried About Charity fundraiser in the Last Year:   . Arboriculturist in the Last Year:   Transportation Needs:   . Film/video editor (Medical):   Marland Kitchen Lack of Transportation (Non-Medical):   Physical Activity:   . Days of Exercise per Week:   . Minutes of Exercise per Session:   Stress:   . Feeling of Stress :   Social Connections:   . Frequency of Communication with Friends and Family:   . Frequency of Social Gatherings with Friends and Family:   . Attends Religious Services:   . Active Member of Clubs or Organizations:   . Attends Archivist Meetings:   Marland Kitchen Marital Status:   Intimate Partner Violence:   . Fear of Current or Ex-Partner:   . Emotionally Abused:   Marland Kitchen Physically Abused:   . Sexually Abused:     Allergies  Allergen Reactions  . Crestor [Rosuvastatin] Other (See Comments)    unknown  . Lipitor [Atorvastatin] Other (See Comments)    Very bad pain in hips and joints    Family History  Problem Relation Age of Onset  . Diabetes Mother 71  . Heart disease Mother   . Hyperlipidemia Mother   . Cancer Father   . Heart attack Father 22  . Cancer Maternal Grandmother   . Cancer Paternal Grandmother   . Thyroid disease Sister   . Diabetes Child     Prior to Admission medications     Medication Sig Start Date End Date Taking? Authorizing Provider  amiodarone (PACERONE) 200 MG tablet Take 0.5 tablets (100 mg total) by mouth daily. 05/02/18   Larey Dresser, MD  apixaban (ELIQUIS) 5 MG TABS tablet Take 1 tablet (5 mg total) by mouth 2 (two) times daily. 11/26/18   Clegg, Amy D, NP  carvedilol (COREG) 6.25 MG tablet TAKE 1 TABLET (6.25 MG TOTAL) BY MOUTH 2 (TWO) TIMES DAILY WITH A MEAL (DOSE INCREASE) 03/24/19   Clegg, Amy D, NP  cholecalciferol (VITAMIN D) 1000 UNITS tablet Take 1,000 Units by mouth 2 (two) times daily.     [provider]  Cyanocobalamin (VITAMIN B-12 PO)  Take by mouth daily.    [provider]  ezetimibe (ZETIA) 10 MG tablet Take 1 tablet (10 mg total) by mouth daily. 11/26/18 02/24/19  Clegg, Amy D, NP  feeding supplement, ENSURE ENLIVE, (ENSURE ENLIVE) LIQD Take 237 mLs by mouth daily. 02/10/18   Hongalgi, Lenis Dickinson, MD  ferrous sulfate 325 (65 FE) MG EC tablet Take 325 mg by mouth daily.    [provider]  insulin aspart (NOVOLOG) 100 UNIT/ML injection Inject 0-15 Units into the skin 3 (three) times daily with meals. 02/19/17   Meuth, Brooke A, PA-C  insulin glargine (LANTUS) 100 UNIT/ML injection Inject 0.06 mLs (6 Units total) into the skin at bedtime. 05/20/17   Bonnell Public, MD  levothyroxine (SYNTHROID) 50 MCG tablet TAKE 1 TABLET EVERY DAY 05/01/19   Hilty, Nadean Corwin, MD  losartan (COZAAR) 25 MG tablet TAKE 1 TABLET EVERY EVENING 03/26/19   Bensimhon, Shaune Pascal, MD  potassium chloride SA (K-DUR,KLOR-CON) 20 MEQ tablet Take 1 tablet (20 mEq total) by mouth daily. 02/27/18   Clegg, Amy D, NP  simvastatin (ZOCOR) 40 MG tablet TAKE 1 TABLET AT BEDTIME 09/02/19   Larey Dresser, MD  torsemide (DEMADEX) 20 MG tablet Take 20 mg by mouth daily.     [provider]  venlafaxine XR (EFFEXOR-XR) 150 MG 24 hr capsule Take 1 capsule (150 mg total) by mouth daily. 11/26/18   Clegg, Amy D, NP  vitamin C (ASCORBIC ACID) 500 MG  tablet Take 500 mg by mouth daily.    [provider]    Physical Exam: Vitals:   10/29/19 1231 10/29/19 1246 10/29/19 1301 10/29/19 1352  BP:    (!) 144/69  Pulse: 87 84 90 87  Resp: '19 19 19 17  '$ Temp:    98.7 F (37.1 C)  TempSrc:      SpO2: 98% 93% 97% 94%     . General:  Appears calm and comfortable and is NAD . Eyes:  PERRL, EOMI, normal lids, iris . ENT:  grossly normal hearing, lips & tongue, mmm; edentulous . Neck:  no LAD, masses or thyromegaly; no carotid bruits . Cardiovascular:  RRR, no m/r/g. No LE edema.  Marland Kitchen Respiratory:   CTA bilaterally with no wheezes/rales/rhonchi.  Normal respiratory effort. . Abdomen:  soft, NT, ND, NABS . Skin:  Punctate lesion on R 5th plantar region; marked edema and erythema up to the mid-calf that appears to be progressing quickly        . Musculoskeletal:  grossly normal tone BUE/BLE, good ROM, no bony abnormality other than as described above . Psychiatric:  grossly normal mood and affect, speech fluent and appropriate, AOx3 . Neurologic:  CN 2-12 grossly intact, moves all extremities in coordinated fashion    Radiological Exams on Admission: DG Foot Complete Right  Result Date: 10/29/2019 CLINICAL DATA:  Dorsal foot redness EXAM: RIGHT FOOT COMPLETE - 3+ VIEW COMPARISON:  09/29/2019 FINDINGS: No acute fracture. No dislocation. Chronic healed fracture at the fifth metatarsal base. Similar degree of arthropathy throughout the foot, most pronounced at the great toe IP joint with unchanged well-defined marginal erosion along the medial surface of the great toe proximal phalanx suggestive of prior crystalline arthropathy such as gout. No new focal erosion or periostitis. Vascular calcifications are noted. Mild diffuse soft tissue swelling. No soft tissue gas. IMPRESSION: 1. No acute findings. No radiographic evidence of acute osteomyelitis. 2. Similar degree of right foot arthropathy, most pronounced at the great toe IP joint.  Electronically Signed   By: Davina Poke D.O.   On: 10/29/2019 11:41    EKG: pending   Labs on Admission: I have personally reviewed the available labs and imaging studies at the time of the admission.  Pertinent labs:    Glucose 271 BUN 21/Creatinine 1.41/GFR 36 AST 49/ALT 31 Lactate 1.6, 1.4 WBC 17.1 Hgb 10.9 UA: small Hgb, small LE, many bacteria Blood and urine cultures pending   Assessment/Plan Principal Problem:   Diabetic infection of right foot (HCC) Active Problems:   CAD- CABG X 4 10/08   Essential hypertension   PAF (paroxysmal atrial fibrillation) (HCC)   Dyslipidemia   Hypothyroid   Chronic systolic CHF (congestive heart failure) (HCC)   Uncontrolled diabetes mellitus type 2 with peripheral artery disease (HCC)   Diabetic foot infection -Previously seen on 5/24 with a blister between the first and second toes -Now with a punctate lesion along the 5th metatarsal plantar surface with marked and rapidly spreading erythema and edema -Concerning for osteo, although only cellulitis is a consideration -R foot xray in ER negative for DVT -Negative lactate, no current concerns for sepsis -Will treat with IV antibiotics (Rocephin and Flagyl as per the diabetic foot ulcer algorithm), was given 1 dose of Vancomycin in the ER -Dr. Jacqualyn Posey is planning to see the patient this afternoon -MRI is ordered (AICD confirmed to be compatible) -NPO in anticipation of washout vs. amputation -I have ordered ABIs in case revascularization may be indicated -LE wound order set utilized including labs (CRP, ESR, A1c, prealbumin, HIV, and blood cultures) and consults (diabetes coordinator; peripheral vascular navigator; TOC team; wound care; and nutrition)  Uncontrolled DM -Last A1c (remotely) was 8.8, indicating poor control -She has been taking NPH BID, will continue -Will cover with moderate-scale SSI  HTN -Continue Coreg, Cozaar  HLD -Continue Zocor and  Zetia  Afib -Rate control with Amiodarone, Coreg -Hold Eliquis in anticipation of possible procedure -Will transition to Heparin for now  Chronic combined CHF -Echo in 08/2017 showed EF 25-30% and grade 1 diastolic dysfunction -AICD is in place and has been confirmed to be MRI-compatible -Hold diuretics while NPO  Hypothyroidism -Elevated TSH at last check 1 year ago; will recheck TSH -Continue Synthroid at current dose for now  CAD s/p CABG -Not on ASA, presumably due to [facility] with Eliquis -No complaints of CP at this time   Note: This patient has been tested and is pending for the novel coronavirus COVID-19.  DVT prophylaxis: Heparin Code Status:  Full - confirmed with patient/family Family Communication: Daughter was present throughout evaluation Disposition Plan:  The patient is from: home  Anticipated d/c is to: home without [facility] services unless amputation is needed and then possibly with [facility]  Anticipated d/c date will depend on clinical response to treatment, likely 2-4 days  Patient is currently: acutely ill Consults called: Podiatry; diabetes coordinator; peripheral vascular navigator; TOC team; wound care; and nutrition Admission status:  Admit - It is my clinical opinion that admission to INPATIENT is reasonable and necessary because of the expectation that this patient will require hospital care that crosses at least 2 midnights to treat this condition based on the medical complexity of the problems presented.  Given the aforementioned information, the predictability of an adverse outcome is felt to be significant.    Karmen Bongo MD [facility]   How to contact the [facility] Attending or Consulting provider [place] or covering provider during after hours [place], for this patient?  1. Check the care team in [facility] and look for a) attending/consulting TR[facility] provider listed and b) the [facility] team listed 2. Log into [email or web address] and use [place]'s universal password to  access. If you do not have the password, please contact the hospital operator. 3. Locate the [facility] provider you are looking for under [facility] and page to a number that you can be directly reached. 4. If you still have difficulty reaching the provider, please page the [facility] (Director on Call) for the Hospitalists listed on amion for assistance.   10/29/2019, 2:07 PM

## 2019-10-29 NOTE — Consult Note (Signed)
Reason for Consult: Cellulitis/abscess Referring Physician: Dr. Jonah Blue, MD  [name] is an 74 y.o. female with a past medical history significant of CAD s/p CABG; AICD placement; afib on Eliquis; hypothyroidism; HTN; HLD; and uncontrolled DM presenting with diabetic foot infection.  She was last seen in the office on May 24.  She states that over the last couple of days she has had worsening pain, swelling or redness to her foot.  She had a fever yesterday she reports of 102.  Her daughter came to see her and EMS was called.  Podiatry is consulted for possible surgical intervention.  MRI which was performed.  Past Medical History:  Diagnosis Date  . Cholelithiases   . Coronary artery disease 02/2007   CABG x4  . Diabetes (HCC)   . Dyslipidemia   . HTN (hypertension)   . Hypothyroid   . PAF (paroxysmal atrial fibrillation) (HCC)    post op    Past Surgical History:  Procedure Laterality Date  . ABDOMINAL HYSTERECTOMY    . CARDIAC CATHETERIZATION  03/01/2007   LAD with 99% prox lesion, at birfucation of Cfx there is 99% lesion, at bifurcation of large OM1 there is 99% lesion, 70% prox lesion at ramus intermedius, 90% lesion in prox RCA (Dr. Claudia Desanctis) - susequent CABG  . CORONARY ARTERY BYPASS GRAFT  03/05/2007   LIMA-LAD, SVG-ramus intermediate. SVG-OM, SVG-PDA (Dr. Donata Clay)  . CORONARY ATHERECTOMY N/A 05/17/2017   Procedure: CORONARY ATHERECTOMY;  Surgeon: Lennette Bihari, MD;  Location: [facility] INVASIVE CV LAB;  Service: Cardiovascular;  Laterality: N/A;  . CORONARY STENT INTERVENTION N/A 05/17/2017   Procedure: CORONARY STENT INTERVENTION;  Surgeon: Lennette Bihari, MD;  Location: [facility] INVASIVE CV LAB;  Service: Cardiovascular;  Laterality: N/A;  . ICD IMPLANT N/A 10/05/2017   Procedure: ICD IMPLANT;  Surgeon: Duke Salvia, MD;  Location: [facility] INVASIVE CV LAB;  Service: Cardiovascular;  Laterality: N/A;  . INCONTINENCE SURGERY  2009  . IR ANGIOGRAM SELECTIVE EACH ADDITIONAL  VESSEL  01/31/2017  . IR ANGIOGRAM SELECTIVE EACH ADDITIONAL VESSEL  01/31/2017  . IR ANGIOGRAM VISCERAL SELECTIVE  01/31/2017  . IR EMBO ART  VEN HEMORR LYMPH EXTRAV  INC GUIDE ROADMAPPING  01/31/2017  . IR THORACENTESIS ASP PLEURAL SPACE W/IMG GUIDE  05/11/2017  . IR THORACENTESIS ASP PLEURAL SPACE W/IMG GUIDE  05/18/2017  . IR US GUIDE VASC ACCESS RIGHT  01/31/2017  . LEFT HEART CATH AND CORS/GRAFTS ANGIOGRAPHY N/A 05/15/2017   Procedure: LEFT HEART CATH AND CORS/GRAFTS ANGIOGRAPHY;  Surgeon: Lennette Bihari, MD;  Location: [facility] INVASIVE CV LAB;  Service: Cardiovascular;  Laterality: N/A;  . LEFT HEART CATH AND CORS/GRAFTS ANGIOGRAPHY N/A 05/17/2017   Procedure: LEFT HEART CATH AND CORS/GRAFTS ANGIOGRAPHY;  Surgeon: Lennette Bihari, MD;  Location: [facility] INVASIVE CV LAB;  Service: Cardiovascular;  Laterality: N/A;  . NM MYOCAR PERF WALL MOTION  07/2011   bruce myoview - mild perfusion defect in basal inferolateral & mid inferolateral regions (attenuation artifact, but prior infarct annote be excluded)' EF 49%; low risk  . TRANSTHORACIC ECHOCARDIOGRAM  07/2011   EF 45-50%, mild septal hypokinesis, mild inferior wall hypokinesis; RV systolc function mildly reduced; borderline LA enlargement; mild MR & TR; AV mildly sclerotic; mild pulm valve regurg     Family History  Problem Relation Age of Onset  . Diabetes Mother 54  . Heart disease Mother   . Hyperlipidemia Mother   . Cancer Father   . Heart attack Father 57  .  Cancer Maternal Grandmother   . Cancer Paternal Grandmother   . Thyroid disease Sister   . Diabetes Child     Social History:  reports that she has never smoked. She has never used smokeless tobacco. She reports that she does not drink alcohol and does not use drugs.  Allergies:  Allergies  Allergen Reactions  . Crestor [Rosuvastatin] Other (See Comments)    unknown  . Lipitor [Atorvastatin] Other (See Comments)    Very bad pain in hips and joints    Medications: I have reviewed the  patient's current medications.  Results for orders placed or performed during the hospital encounter of 10/28/19 (from the past 48 hour(s))  Lactic acid, plasma     Status: None   Collection Time: 10/28/19  9:12 PM  Result Value Ref Range   Lactic Acid, Venous 1.6 0.5 - 1.9 mmol/L    Comment: Performed at [facility], [address] [place], [place] [address]  Comprehensive metabolic panel     Status: Abnormal   Collection Time: 10/28/19  9:12 PM  Result Value Ref Range   Sodium 134 (L) 135 - 145 mmol/L   Potassium 3.6 3.5 - 5.1 mmol/L   Chloride 98 98 - 111 mmol/L   CO2 25 22 - 32 mmol/L   Glucose, Bld 271 (H) 70 - 99 mg/dL    Comment: Glucose reference range applies only to samples taken after fasting for at least 8 hours.   BUN 21 8 - 23 mg/dL   Creatinine, Ser 5.05 (H) 0.44 - 1.00 mg/dL   Calcium 8.7 (L) 8.9 - 10.3 mg/dL   Total Protein 7.2 6.5 - 8.1 g/dL   Albumin 3.6 3.5 - 5.0 g/dL   AST 49 (H) 15 - 41 U/L   ALT 31 0 - 44 U/L   Alkaline Phosphatase 112 38 - 126 U/L   Total Bilirubin 0.6 0.3 - 1.2 mg/dL   GFR calc non Af Amer 36 (L) >60 mL/min   GFR calc Af Amer 42 (L) >60 mL/min   Anion gap 11 5 - 15    Comment: Performed at [facility], [address], [place], [place] [address]  CBC with Differential     Status: Abnormal   Collection Time: 10/28/19  9:12 PM  Result Value Ref Range   WBC 17.1 (H) 4.0 - 10.5 K/uL   RBC 3.79 (L) 3.87 - 5.11 MIL/uL   Hemoglobin 10.9 (L) 12.0 - 15.0 g/dL   HCT 34.1 (L) 36 - 46 %   MCV 93.7 80.0 - 100.0 fL   MCH 28.8 26.0 - 34.0 pg   MCHC 30.7 30.0 - 36.0 g/dL   RDW 93.7 90.2 - 40.9 %   Platelets 287 150 - 400 K/uL   nRBC 0.0 0.0 - 0.2 %   Neutrophils Relative % 87 %   Neutro Abs 14.9 (H) 1.7 - 7.7 K/uL   Lymphocytes Relative 5 %   Lymphs Abs 0.9 0.7 - 4.0 K/uL   Monocytes Relative 6 %   Monocytes Absolute 1.0 0 - 1 K/uL   Eosinophils Relative 1 %   Eosinophils Absolute 0.1 0 - 0 K/uL   Basophils Relative 0 %    Basophils Absolute 0.1 0 - 0 K/uL   Immature Granulocytes 1 %   Abs Immature Granulocytes 0.09 (H) 0.00 - 0.07 K/uL    Comment: Performed at [facility], [address], [place], [place] [address]  Urinalysis, Routine w  reflex microscopic     Status: Abnormal   Collection Time: 10/28/19 11:01 PM  Result Value Ref Range   Color, Urine YELLOW YELLOW   APPearance HAZY (A) CLEAR   Specific Gravity, Urine 1.009 1.005 - 1.030   pH 5.0 5.0 - 8.0   Glucose, UA NEGATIVE NEGATIVE mg/dL   Hgb urine dipstick SMALL (A) NEGATIVE   Bilirubin Urine NEGATIVE NEGATIVE   Ketones, ur NEGATIVE NEGATIVE mg/dL   Protein, ur NEGATIVE NEGATIVE mg/dL   Nitrite NEGATIVE NEGATIVE   Leukocytes,Ua SMALL (A) NEGATIVE   RBC / HPF 0-5 0 - 5 RBC/hpf   WBC, UA 21-50 0 - 5 WBC/hpf   Bacteria, UA MANY (A) NONE SEEN   Squamous Epithelial / LPF 0-5 0 - 5   Hyaline Casts, UA PRESENT     Comment: Performed at [facility], [address], [place], [place] [address]  CBG monitoring, ED     Status: Abnormal   Collection Time: 10/29/19  9:43 AM  Result Value Ref Range   Glucose-Capillary 188 (H) 70 - 99 mg/dL    Comment: Glucose reference range applies only to samples taken after fasting for at least 8 hours.   Comment 1 Notify RN    Comment 2 Document in Chart   Lactic acid, plasma     Status: None   Collection Time: 10/29/19 12:09 PM  Result Value Ref Range   Lactic Acid, Venous 1.4 0.5 - 1.9 mmol/L    Comment: Performed at [facility], [address] [place], [place] [address]  SARS Coronavirus 2 by RT PCR (hospital order, performed in [facility] hospital lab) Nasopharyngeal Nasopharyngeal Swab     Status: None   Collection Time: 10/29/19  1:27 PM   Specimen: Nasopharyngeal Swab  Result Value Ref Range   SARS Coronavirus 2 NEGATIVE NEGATIVE    Comment: (NOTE) SARS-CoV-2 target nucleic acids are NOT DETECTED.  The SARS-CoV-2 RNA is generally detectable in upper and lower respiratory  specimens during the acute phase of infection. The lowest concentration of SARS-CoV-2 viral copies this assay can detect is 250 copies / mL. A negative result does not preclude SARS-CoV-2 infection and should not be used as the sole basis for treatment or other patient management decisions.  A negative result may occur with improper specimen collection / handling, submission of specimen other than nasopharyngeal swab, presence of viral mutation(s) within the areas targeted by this assay, and inadequate number of viral copies (<250 copies / mL). A negative result must be combined with clinical observations, patient history, and epidemiological information.  Fact Sheet for Patients:   BoilerBrush.com.cy  Fact Sheet for Healthcare Providers: https://pope.com/  This test is not yet approved or  cleared by the [place] FDA and has been authorized for detection and/or diagnosis of SARS-CoV-2 by FDA under an Emergency Use Authorization (EUA).  This EUA will remain in effect (meaning this test can be used) for the duration of the COVID-19 declaration under Section 564(b)(1) of the Act, 21 U.S.C. section 360bbb-3(b)(1), unless the authorization is terminated or revoked sooner.  Performed at [facility], [address], [place], [place] [address]   Hemoglobin A1c     Status: Abnormal   Collection Time: 10/29/19  3:08 PM  Result Value Ref Range   Hgb A1c MFr Bld 9.3 (H) 4.8 - 5.6 %    Comment: (NOTE) Pre diabetes:          5.7%-6.4%  Diabetes:              >  6.4%  Glycemic control for   <7.0% adults with diabetes    Mean Plasma Glucose 220.21 mg/dL    Comment: Performed at [facility] [address] [place], [place] [address]    MR FOOT RIGHT WO CONTRAST  Result Date: 10/29/2019 CLINICAL DATA:  Evaluate for osteomyelitis. Diabetic foot infection. Red and swollen. EXAM: MRI OF THE RIGHT FOREFOOT WITHOUT CONTRAST  TECHNIQUE: Multiplanar, multisequence MR imaging of the right foot was performed. No intravenous contrast was administered. COMPARISON:  None. FINDINGS: Appear patient motion degrading image quality. Large field of view degrades image quality with significant areas of poor fat saturation. Bones/Joint/Cartilage No fracture or dislocation. Normal alignment. No joint effusion. No areas of bone destruction or periosteal reaction. No aggressive osseous lesion. Chronic tearing signal abnormality in the base of the fifth metatarsal likely related to prior trauma. Ligaments Collateral ligaments are intact.  Lisfranc ligament is intact. Muscles and Tendons Flexor, peroneal and extensor compartment tendons are intact. Generalized muscle atrophy. Mild T2 hyperintense signal in the plantar musculature likely neurogenic. Soft tissue No fluid collection or hematoma. No soft tissue mass. Soft tissue edema in the subcutaneous fat of the right foot extending into the ankle and lower leg which may be reactive versus secondary to cellulitis. Bandaging material along the plantar aspect of the great toe. IMPRESSION: 1. No evidence of osteomyelitis of right foot. Soft tissue edema of the right foot extending into the ankle and lower leg concerning for cellulitis versus reactive edema. Electronically Signed   By: Kathreen Devoid   On: 10/29/2019 17:32   DG Foot Complete Right  Result Date: 10/29/2019 CLINICAL DATA:  Dorsal foot redness EXAM: RIGHT FOOT COMPLETE - 3+ VIEW COMPARISON:  09/29/2019 FINDINGS: No acute fracture. No dislocation. Chronic healed fracture at the fifth metatarsal base. Similar degree of arthropathy throughout the foot, most pronounced at the great toe IP joint with unchanged well-defined marginal erosion along the medial surface of the great toe proximal phalanx suggestive of prior crystalline arthropathy such as gout. No new focal erosion or periostitis. Vascular calcifications are noted. Mild diffuse soft tissue  swelling. No soft tissue gas. IMPRESSION: 1. No acute findings. No radiographic evidence of acute osteomyelitis. 2. Similar degree of right foot arthropathy, most pronounced at the great toe IP joint. Electronically Signed   By: Davina Poke D.O.   On: 10/29/2019 11:41    Review of Systems Blood pressure (!) 144/69, pulse 87, temperature 98.7 F (37.1 C), resp. rate 17, SpO2 94 %. Physical Exam General: AAO x3, NAD-in emergency department with daughter at bedside  Dermatological: Hyperkeratotic lesion right foot submetatarsal 1 and there is a blister surrounding this area going into the first interspace.  Upon debridement of the blister there was purulence identified.  I did debride the callus as well as some epidermolysis and the abscess appeared to be superficial and subcutaneous tissues.  Did not appear to going deep.  There is no other areas of fluctuation or crepitation.  There is erythema to the midfoot and swelling to the right foot and leg.  Vascular: Pulses decreased  Neruologic: Sensation decreased  Musculoskeletal: Prior to debridement there was mild tenderness submetatarsal 1 on the area of the callus.  No other areas of significant tenderness identified.  Assessment/Plan: Right foot cellulitis with superficial abscess  X-rays were obtained which not reveal any evidence of acute osteomyelitis or acute findings.  MRI was performed Again without any evidence of osteomyelitis.  Soft tissue edema at the right  foot extending to the lower leg concerning for cellulitis versus reactive edema.  White blood cell count elevated lactic acid normal. A1c elevated at 9.3.  After verbal consent was obtained I cleaned the skin with Betadine and debrided the hyperkeratotic lesion submetatarsal 1 and there was purulence identified under the callus extending superficially on irritable surgery to the first interspace.  Wound culture was obtained.  I debrided the loose skin overlying this area.   There is no probing or any signs of deep infection.  There is cleaned and a dressing was applied.  At this time we will hold off any surgical intervention monitor clinically.  We will follow wound culture and base antibiotics on clinical response and culture results.  Encourage elevation.  Arterial studies pending.  N.p.o. order was discontinued  Vivi BarrackMatthew R Safal Halderman 10/29/2019, 6:20 PM   O: [phone] C: [id number]

## 2019-10-29 NOTE — Progress Notes (Signed)
ANTICOAGULATION CONSULT NOTE - Initial Consult  Pharmacy Consult for heparin Indication: atrial fibrillation  Allergies  Allergen Reactions  . Crestor [Rosuvastatin] Other (See Comments)    unknown  . Lipitor [Atorvastatin] Other (See Comments)    Very bad pain in hips and joints    Patient Measurements:   Heparin Dosing Weight: 64 kg (patient reported)   Vital Signs: Temp: 98.7 F (37.1 C) (06/23 1352) Temp Source: Oral (06/23 1009) BP: 144/69 (06/23 1352) Pulse Rate: 87 (06/23 1352)  Labs: Recent Labs    10/28/19 2112  HGB 10.9*  HCT 35.5*  PLT 287  CREATININE 1.41*    CrCl cannot be calculated (Unknown ideal weight.).   Medical History: Past Medical History:  Diagnosis Date  . Cholelithiases   . Coronary artery disease 02/2007   CABG x4  . Diabetes (HCC)   . Dyslipidemia   . HTN (hypertension)   . Hypothyroid   . PAF (paroxysmal atrial fibrillation) (HCC)    post op    Medications:  (Not in a hospital admission)   Assessment: 21 YOF who presents with right foot swelling concerning for diabetic foot infection. She has a h/o Afib on Eliquis at home. Pharmacy consulted to transition to IV heparin. H/H low, Plt wnl. SCr elevated at 1.41   Of note, last dose of apixaban was yesterday  Goal of Therapy:  Heparin level 0.3-0.7 units/ml Monitor platelets by anticoagulation protocol: Yes   Plan:  -Start IV heparin at 900 units/hr. No bolus  -F/u 8 hr HL and aPTT  -Monitor daily HL, aPTT, CBC and s/s of bleeding   Vinnie Level, PharmD., BCPS, BCCCP Clinical Pharmacist Clinical phone for 10/29/19 until 3:30pm: [phone] If after 3:30pm, please refer to [facility] for unit-specific pharmacist

## 2019-10-29 NOTE — Telephone Encounter (Signed)
[facility] called requesting a consult from our office for patient in regards to Infected diabetic foot wound.  Consult has been entered into EPIC

## 2019-10-29 NOTE — ED Provider Notes (Addendum)
[facility] EMERGENCY DEPARTMENT Provider Note   CSN: [id number] Arrival date & time: 10/28/19  2054     History No chief complaint on file.   [name] is a 74 y.o. female.  Patient with history of diabetes, history of coronary artery disease status post CABG, paroxysmal atrial fibrillation on Eliquis--presents to the emergency department today with complaint of fever as well as worsening redness and warmth in her right foot and ankle.  Symptoms started about 3 days ago.  Patient has a history of a callus on the right plantar midfoot for which she has seen podiatry in the past.  She also has fungal toenail infection.  No new injuries.  Redness is gradually worsened.  She had a fever to 73 F upon ED arrival yesterday.  No nausea, vomiting, or diarrhea.  No treatments prior to arrival.  The onset of this condition was acute. Aggravating factors: none. Alleviating factors: none.          Past Medical History:  Diagnosis Date  . Cholelithiases   . Coronary artery disease 02/2007   CABG  . Diabetes (HCC)   . Dyslipidemia   . HTN (hypertension)   . Hypothyroid   . PAF (paroxysmal atrial fibrillation) (HCC)    post op  . S/P CABG x 4 2008    Patient Active Problem List   Diagnosis Date Noted  . CAP (community acquired pneumonia) 02/07/2018  . Ischemic cardiomyopathy 10/05/2017  . Chronic systolic CHF (congestive heart failure) (HCC) 09/03/2017  . Orthostatic hypotension 09/03/2017  . Pleural effusion   . Status post coronary artery stent placement   . Status post thoracentesis   . NSTEMI (non-ST elevated myocardial infarction) (HCC)   . NSVT (nonsustained ventricular tachycardia) (HCC) 02/08/2017  . SOB (shortness of breath)   . Hypertensive heart disease without heart failure   . 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 Diabetes (HCC) 12/17/2012  . Essential hypertension 12/17/2012  . PAF (paroxysmal atrial fibrillation) (HCC) 12/17/2012  .  Dyslipidemia 12/17/2012  . Hypothyroid 12/17/2012  . Depression 12/17/2012    Past Surgical History:  Procedure Laterality Date  . ABDOMINAL HYSTERECTOMY    . CARDIAC CATHETERIZATION  03/01/2007   LAD with 99% prox lesion, at birfucation of Cfx there is 99% lesion, at bifurcation of large OM1 there is 99% lesion, 70% prox lesion at ramus intermedius, 90% lesion in prox RCA (Dr. Claudia Desanctis) - susequent CABG  . CORONARY ARTERY BYPASS GRAFT  03/05/2007   LIMA-LAD, SVG-ramus intermediate. SVG-OM, SVG-PDA (Dr. Donata Clay)  . CORONARY ATHERECTOMY N/A 05/17/2017   Procedure: CORONARY ATHERECTOMY;  Surgeon: Lennette Bihari, MD;  Location: [facility] INVASIVE CV LAB;  Service: Cardiovascular;  Laterality: N/A;  . CORONARY STENT INTERVENTION N/A 05/17/2017   Procedure: CORONARY STENT INTERVENTION;  Surgeon: Lennette Bihari, MD;  Location: [facility] INVASIVE CV LAB;  Service: Cardiovascular;  Laterality: N/A;  . ICD IMPLANT N/A 10/05/2017   Procedure: ICD IMPLANT;  Surgeon: Duke Salvia, MD;  Location: [facility] INVASIVE CV LAB;  Service: Cardiovascular;  Laterality: N/A;  . INCONTINENCE SURGERY  2009  . IR ANGIOGRAM SELECTIVE EACH ADDITIONAL VESSEL  01/31/2017  . IR ANGIOGRAM SELECTIVE EACH ADDITIONAL VESSEL  01/31/2017  . IR ANGIOGRAM VISCERAL SELECTIVE  01/31/2017  . IR EMBO ART  VEN HEMORR LYMPH EXTRAV  INC GUIDE ROADMAPPING  01/31/2017  . IR THORACENTESIS ASP PLEURAL SPACE W/IMG GUIDE  05/11/2017  . IR THORACENTESIS ASP PLEURAL SPACE W/IMG GUIDE  05/18/2017  . IR US GUIDE VASC ACCESS RIGHT  01/31/2017  . LEFT HEART CATH AND CORS/GRAFTS ANGIOGRAPHY N/A 05/15/2017   Procedure: LEFT HEART CATH AND CORS/GRAFTS ANGIOGRAPHY;  Surgeon: Lennette Bihari, MD;  Location: [facility] INVASIVE CV LAB;  Service: Cardiovascular;  Laterality: N/A;  . LEFT HEART CATH AND CORS/GRAFTS ANGIOGRAPHY N/A 05/17/2017   Procedure: LEFT HEART CATH AND CORS/GRAFTS ANGIOGRAPHY;  Surgeon: Lennette Bihari, MD;  Location: [facility] INVASIVE CV LAB;  Service: Cardiovascular;   Laterality: N/A;  . NM MYOCAR PERF WALL MOTION  07/2011   bruce myoview - mild perfusion defect in basal inferolateral & mid inferolateral regions (attenuation artifact, but prior infarct annote be excluded)' EF 49%; low risk  . TRANSTHORACIC ECHOCARDIOGRAM  07/2011   EF 45-50%, mild septal hypokinesis, mild inferior wall hypokinesis; RV systolc function mildly reduced; borderline LA enlargement; mild MR & TR; AV mildly sclerotic; mild pulm valve regurg      OB History   No obstetric history on file.     Family History  Problem Relation Age of Onset  . Diabetes Mother 38  . Heart disease Mother   . Hyperlipidemia Mother   . Cancer Father   . Heart attack Father 43  . Cancer Maternal Grandmother   . Cancer Paternal Grandmother   . Thyroid disease Sister   . Diabetes Child     Social History   Tobacco Use  . Smoking status: Never Smoker  . Smokeless tobacco: Never Used  Vaping Use  . Vaping Use: Never used  Substance Use Topics  . Alcohol use: No  . Drug use: No    Home Medications Prior to Admission medications   Medication Sig Start Date End Date Taking? Authorizing Provider  amiodarone (PACERONE) 200 MG tablet Take 0.5 tablets (100 mg total) by mouth daily. 05/02/18   Laurey Morale, MD  apixaban (ELIQUIS) 5 MG TABS tablet Take 1 tablet (5 mg total) by mouth 2 (two) times daily. 11/26/18   Clegg, Amy D, NP  carvedilol (COREG) 6.25 MG tablet TAKE 1 TABLET (6.25 MG TOTAL) BY MOUTH 2 (TWO) TIMES DAILY WITH A MEAL (DOSE INCREASE) 03/24/19   Clegg, Amy D, NP  cholecalciferol (VITAMIN D) 1000 UNITS tablet Take 1,000 Units by mouth 2 (two) times daily.     [provider]  Cyanocobalamin (VITAMIN B-12 PO) Take by mouth daily.    [provider]  ezetimibe (ZETIA) 10 MG tablet Take 1 tablet (10 mg total) by mouth daily. 11/26/18 02/24/19  Clegg, Amy D, NP  feeding supplement, ENSURE ENLIVE, (ENSURE ENLIVE) LIQD Take 237 mLs by mouth daily. 02/10/18   Hongalgi,  Maximino Greenland, MD  ferrous sulfate 325 (65 FE) MG EC tablet Take 325 mg by mouth daily.    [provider]  insulin aspart (NOVOLOG) 100 UNIT/ML injection Inject 0-15 Units into the skin 3 (three) times daily with meals. 02/19/17   Meuth, Brooke A, PA-C  insulin glargine (LANTUS) 100 UNIT/ML injection Inject 0.06 mLs (6 Units total) into the skin at bedtime. 05/20/17   Barnetta Chapel, MD  levothyroxine (SYNTHROID) 50 MCG tablet TAKE 1 TABLET EVERY DAY 05/01/19   Hilty, Lisette Abu, MD  losartan (COZAAR) 25 MG tablet TAKE 1 TABLET EVERY EVENING 03/26/19   Bensimhon, Bevelyn Buckles, MD  potassium chloride SA (K-DUR,KLOR-CON) 20 MEQ tablet Take 1 tablet (20 mEq total) by mouth daily. 02/27/18   Clegg, Amy D, NP  simvastatin (ZOCOR) 40 MG tablet TAKE 1 TABLET AT  BEDTIME 09/02/19   Larey Dresser, MD  torsemide (DEMADEX) 20 MG tablet Take 20 mg by mouth daily.     [provider]  venlafaxine XR (EFFEXOR-XR) 150 MG 24 hr capsule Take 1 capsule (150 mg total) by mouth daily. 11/26/18   Clegg, Amy D, NP  vitamin C (ASCORBIC ACID) 500 MG tablet Take 500 mg by mouth daily.    [provider]    Allergies    Crestor [rosuvastatin] and Lipitor [atorvastatin]  Review of Systems   Review of Systems  Constitutional: Negative for fever.  HENT: Negative for rhinorrhea and sore throat.   Eyes: Negative for redness.  Respiratory: Negative for cough.   Cardiovascular: Positive for leg swelling. Negative for chest pain.  Gastrointestinal: Negative for abdominal pain, diarrhea, nausea and vomiting.  Genitourinary: Negative for dysuria.  Musculoskeletal: Positive for myalgias.  Skin: Positive for color change and wound.  Neurological: Negative for headaches.    Physical Exam Updated Vital Signs BP (!) 154/84   Pulse 86   Temp 98.3 F (36.8 C) (Oral)   Resp (!) 21   SpO2 99%   Physical Exam Vitals and nursing note reviewed.  Constitutional:      Appearance: She is well-developed.    HENT:     Head: Normocephalic and atraumatic.  Eyes:     General:        Right eye: No discharge.        Left eye: No discharge.     Conjunctiva/sclera: Conjunctivae normal.  Cardiovascular:     Rate and Rhythm: Normal rate and regular rhythm.     Pulses:          Dorsalis pedis pulses are 2+ on the right side and 2+ on the left side.       Posterior tibial pulses are 2+ on the right side and 2+ on the left side.     Heart sounds: Normal heart sounds.  Pulmonary:     Effort: Pulmonary effort is normal.     Breath sounds: Normal breath sounds.  Abdominal:     Palpations: Abdomen is soft.     Tenderness: There is no abdominal tenderness.  Musculoskeletal:     Cervical back: Normal range of motion and neck supple.  Skin:    General: Skin is warm and dry.     Findings: Erythema present.     Comments: Patient with erythema and edema generalized, of the right foot extending onto the ankle.  Skin is warm.  Patient has a callus noted to the plantar aspect of the right midfoot.  No active drainage.  There does appear to be fluid collection in this area that extends between the great toe and second toe.  That area is fluctuant.  Neurological:     Mental Status: She is alert.                  ED Results / Procedures / Treatments   Labs (all labs ordered are listed, but only abnormal results are displayed) Labs Reviewed  COMPREHENSIVE METABOLIC PANEL - Abnormal; Notable for the following components:      Result Value   Sodium 134 (*)    Glucose, Bld 271 (*)    Creatinine, Ser 1.41 (*)    Calcium 8.7 (*)    AST 49 (*)    GFR calc non Af Amer 36 (*)    GFR calc Af Amer 42 (*)    All other components within normal limits  CBC WITH DIFFERENTIAL/PLATELET - Abnormal; Notable for the following components:   WBC 17.1 (*)    RBC 3.79 (*)    Hemoglobin 10.9 (*)    HCT 35.5 (*)    Neutro Abs 14.9 (*)    Abs Immature Granulocytes 0.09 (*)    All other components within normal  limits  URINALYSIS, ROUTINE W REFLEX MICROSCOPIC - Abnormal; Notable for the following components:   APPearance HAZY (*)    Hgb urine dipstick SMALL (*)    Leukocytes,Ua SMALL (*)    Bacteria, UA MANY (*)    All other components within normal limits  CBG MONITORING, ED - Abnormal; Notable for the following components:   Glucose-Capillary 188 (*)    All other components within normal limits  URINE CULTURE  CULTURE, BLOOD (ROUTINE X 2)  CULTURE, BLOOD (ROUTINE X 2)  SARS CORONAVIRUS 2 BY RT PCR (HOSPITAL ORDER, PERFORMED IN [facility])  LACTIC ACID, PLASMA  LACTIC ACID, PLASMA    EKG None  Radiology DG Foot Complete Right  Result Date: 10/29/2019 CLINICAL DATA:  Dorsal foot redness EXAM: RIGHT FOOT COMPLETE - 3+ VIEW COMPARISON:  09/29/2019 FINDINGS: No acute fracture. No dislocation. Chronic healed fracture at the fifth metatarsal base. Similar degree of arthropathy throughout the foot, most pronounced at the great toe IP joint with unchanged well-defined marginal erosion along the medial surface of the great toe proximal phalanx suggestive of prior crystalline arthropathy such as gout. No new focal erosion or periostitis. Vascular calcifications are noted. Mild diffuse soft tissue swelling. No soft tissue gas. IMPRESSION: 1. No acute findings. No radiographic evidence of acute osteomyelitis. 2. Similar degree of right foot arthropathy, most pronounced at the great toe IP joint. Electronically Signed   By: Duanne Guess D.O.   On: 10/29/2019 11:41    Procedures Procedures (including critical care time)  Medications Ordered in ED Medications  sodium chloride flush (NS) 0.9 % injection 3 mL (has no administration in time range)  piperacillin-tazobactam (ZOSYN) IVPB 3.375 g (has no administration in time range)  vancomycin (VANCOREADY) IVPB 1250 mg/250 mL (has no administration in time range)  vancomycin (VANCOREADY) IVPB 750 mg/150 mL (has no administration in time  range)  acetaminophen (TYLENOL) tablet 650 mg (650 mg Oral Given 10/28/19 2104)    ED Course  I have reviewed the triage vital signs and the nursing notes.  Pertinent labs & imaging results that were available during my care of the patient were reviewed by me and considered in my medical decision making (see chart for details).  Patient seen and examined. Work-up reviewed.  Patient will require admission for diabetic foot infection.  Spoke with our orthopedist service here, as patient is established with podiatry, will request consultation from them first.  IV antibiotics ordered, blood culture sent.  Vital signs reviewed and are as follows: BP (!) 154/84   Pulse 87   Temp 98.3 F (36.8 C) (Oral)   Resp 16   SpO2 100%   12:54 PM Patient discussed previously and seen by Dr. Clarene Duke.  No callback from podiatry x2 over the past 90 minutes.  I discussed the case with Dr. Ophelia Charter.  We will continue to page podiatry.  1:10 PM Per secretary, Dr. Ardelle Anton is on for podiatry. He is in surgery. Anticipate callback later this afternoon.   3:03 PM At shift change -- no callback from podiatry.     MDM Rules/Calculators/A&P  Patient with diabetic foot infection, fever, elevated white blood cell count requiring admission for IV antibiotics.  She will also require evaluation by podiatry or orthopedics.   Final Clinical Impression(s) / ED Diagnoses Final diagnoses:  Diabetic foot infection [facility](HCC)    Rx / DC Orders ED Discharge Orders    None       Renne CriglerGeiple, Toluwanimi Radebaugh, PA-C 10/29/19 1331    Little, Ambrose Finlandachel Morgan, MD 10/29/19 1400    Renne CriglerGeiple, Sania Noy, PA-C 10/29/19 1503    Little, Ambrose Finlandachel Morgan, MD 10/29/19 1531

## 2019-10-29 NOTE — Progress Notes (Signed)
Pharmacy Antibiotic Note  [name] is a 74 y.o. female admitted on 10/28/2019 with cellulitis.  Pharmacy has been consulted for vancomycin dosing.  Plan: Vancomycin 1250 mg IV x 1 then, 750 mg IV every 24 hours Monitor renal function, clinical progression and LOT Vancomycin trough at steady state     Temp (24hrs), Avg:99.5 F (37.5 C), Min:98.1 F (36.7 C), Max:102.2 F (39 C)  Recent Labs  Lab 10/28/19 2112  WBC 17.1*  CREATININE 1.41*  LATICACIDVEN 1.6    CrCl cannot be calculated (Unknown ideal weight.).    Allergies  Allergen Reactions  . Crestor [Rosuvastatin] Other (See Comments)    unknown  . Lipitor [Atorvastatin] Other (See Comments)    Very bad pain in hips and joints    Daylene Posey, PharmD Clinical Pharmacist ED Pharmacist Phone # [phone] 10/29/2019 11:07 AM

## 2019-10-29 NOTE — ED Notes (Signed)
Attempted report 

## 2019-10-29 NOTE — ED Notes (Addendum)
Skin marked on right lower extremeity. There has been an increase of redness, has moved further up the leg, was originally stopped at the ankle. The 5th, 4th, and 3rd toes are now white. I checked the oxygen saturation in all toes on Right foot - saturations below, left foot all toes were 94% or higher.   Great toe - 90% 2nd toe - 90%  3rd toe - 90 4th toe - 92% 5th toe - 92 %  Pulse matched what cardiac monitoring was reading for all toes.

## 2019-10-29 NOTE — Telephone Encounter (Signed)
I had spoken to the attending and saw the patient.

## 2019-10-29 NOTE — ED Notes (Signed)
Patient transported to MRI 

## 2019-10-30 ENCOUNTER — Encounter (HOSPITAL_COMMUNITY): Payer: Medicare HMO

## 2019-10-30 DIAGNOSIS — E1151 Type 2 diabetes mellitus with diabetic peripheral angiopathy without gangrene: Secondary | ICD-10-CM

## 2019-10-30 DIAGNOSIS — I5022 Chronic systolic (congestive) heart failure: Secondary | ICD-10-CM

## 2019-10-30 DIAGNOSIS — E039 Hypothyroidism, unspecified: Secondary | ICD-10-CM

## 2019-10-30 DIAGNOSIS — I480 Paroxysmal atrial fibrillation: Secondary | ICD-10-CM

## 2019-10-30 DIAGNOSIS — I25810 Atherosclerosis of coronary artery bypass graft(s) without angina pectoris: Secondary | ICD-10-CM

## 2019-10-30 DIAGNOSIS — L02611 Cutaneous abscess of right foot: Secondary | ICD-10-CM

## 2019-10-30 DIAGNOSIS — E1165 Type 2 diabetes mellitus with hyperglycemia: Secondary | ICD-10-CM

## 2019-10-30 LAB — BASIC METABOLIC PANEL
Anion gap: 8 (ref 5–15)
BUN: 17 mg/dL (ref 8–23)
CO2: 26 mmol/L (ref 22–32)
Calcium: 8.8 mg/dL — ABNORMAL LOW (ref 8.9–10.3)
Chloride: 106 mmol/L (ref 98–111)
Creatinine, Ser: 1.12 mg/dL — ABNORMAL HIGH (ref 0.44–1.00)
GFR calc Af Amer: 56 mL/min — ABNORMAL LOW (ref >60)
GFR calc non Af Amer: 48 mL/min — ABNORMAL LOW (ref >60)
Glucose, Bld: 198 mg/dL — ABNORMAL HIGH (ref 70–99)
Potassium: 3.6 mmol/L (ref 3.5–5.1)
Sodium: 140 mmol/L (ref 135–145)

## 2019-10-30 LAB — GLUCOSE, CAPILLARY
Glucose-Capillary: 112 mg/dL — ABNORMAL HIGH (ref 70–99)
Glucose-Capillary: 115 mg/dL — ABNORMAL HIGH (ref 70–99)
Glucose-Capillary: 111 mg/dL — ABNORMAL HIGH (ref 70–99)
Glucose-Capillary: 169 mg/dL — ABNORMAL HIGH (ref 70–99)

## 2019-10-30 LAB — CBC
HCT: 31.3 % — ABNORMAL LOW (ref 36.0–46.0)
Hemoglobin: 9.8 g/dL — ABNORMAL LOW (ref 12.0–15.0)
MCH: 28.9 pg (ref 26.0–34.0)
MCHC: 31.3 g/dL (ref 30.0–36.0)
MCV: 92.3 fL (ref 80.0–100.0)
Platelets: 254 10*3/uL (ref 150–400)
RBC: 3.39 MIL/uL — ABNORMAL LOW (ref 3.87–5.11)
RDW: 13.1 % (ref 11.5–15.5)
WBC: 7.7 10*3/uL (ref 4.0–10.5)
nRBC: 0 % (ref 0.0–0.2)

## 2019-10-30 MED ORDER — VITAMIN D 25 MCG (1000 UNIT) PO TABS
1000.0000 [IU] | ORAL_TABLET | Freq: Two times a day (BID) | ORAL | Status: DC
  Administered 2019-10-30 – 2019-11-01 (×5): 1000 [IU] via ORAL
  Filled 2019-10-30 (×5): qty 1

## 2019-10-30 MED ORDER — JUVEN PO PACK
1.0000 | PACK | Freq: Two times a day (BID) | ORAL | Status: DC
  Administered 2019-10-30 – 2019-10-31 (×2): 1 via ORAL
  Filled 2019-10-30 (×3): qty 1

## 2019-10-30 MED ORDER — ADULT MULTIVITAMIN W/MINERALS CH
1.0000 | ORAL_TABLET | Freq: Every day | ORAL | Status: DC
  Administered 2019-10-30 – 2019-11-01 (×3): 1 via ORAL
  Filled 2019-10-30 (×3): qty 1

## 2019-10-30 MED ORDER — ASCORBIC ACID 500 MG PO TABS
500.0000 mg | ORAL_TABLET | Freq: Every day | ORAL | Status: DC
  Administered 2019-10-30 – 2019-11-01 (×3): 500 mg via ORAL
  Filled 2019-10-30 (×3): qty 1

## 2019-10-30 MED ORDER — GLUCERNA SHAKE PO LIQD
237.0000 mL | Freq: Two times a day (BID) | ORAL | Status: DC
  Administered 2019-10-30: 237 mL via ORAL

## 2019-10-30 MED ORDER — TORSEMIDE 20 MG PO TABS
20.0000 mg | ORAL_TABLET | Freq: Every day | ORAL | Status: DC
  Administered 2019-10-30 – 2019-11-01 (×3): 20 mg via ORAL
  Filled 2019-10-30 (×3): qty 1

## 2019-10-30 NOTE — Progress Notes (Signed)
Received pt alert and oriented x 4. Dressing to right foot. Redness towards ankle noted. Oriented pt to room and use of call light.

## 2019-10-30 NOTE — Progress Notes (Signed)
Subjective: 74 year old female presented to emergency department yesterday for cellulitis, superficial abscess to right foot.  This was debrided at bedside and culture was obtained.  She has been on IV antibiotics and she states that today the swelling is much improved and she is having no pain to the foot.  She has no concerns today.  Denies any fevers, chills, nausea, vomiting.  No calf pain, chest pain, shortness of breath.  Objective: NAD-daughter at bedside DP/PT pulses palpable 1/4 bilaterally, CRT less than 3 seconds Significant improved edema and erythema to the right lower extremity.  Superficial wound on the right foot submetatarsal 1 area with a blister, callus was debrided.  There is no purulence identified there is no probing, undermining or tunneling. No pain with calf compression, swelling, warmth, erythema       Assessment: Cellulitis with improvement  Plan: Overall she is doing much better.  Her white blood cell count has returned to normal.  She is afebrile.  For now continue IV antibiotics but I believe that she can likely be discharged on Friday with oral antibiotics.  Would discharge home with oral antibiotics based on wound culture.  Surgical shoe she has at bedside.  Xeroform, dry sterile dressing was applied to the plantar foot wound.  She has arterial studies ordered and likely has been done prior to discharge.  I will follow-up with her in clinic next week  Ovid Curd, DPM O: [phone] C: [phone]

## 2019-10-30 NOTE — Consult Note (Signed)
WOC Nurse Consult Note: Patient receiving care in [facility] 6N30. Podiatry is managing wound on right foot, plantar surface. Please direct all questions pertaining to the wound to Podiatry services. Reason for Consult: Wound type: Pressure Injury POA: Yes/No/NA Measurement: Wound bed: Drainage (amount, consistency, odor)  Periwound: Dressing procedure/placement/frequency: Thank you for the consult. WOC nurse will not follow at this time.  Please re-consult the WOC team if needed.  Helmut Muster, RN, MSN, CWOCN, CNS-BC, pager [phone]

## 2019-10-30 NOTE — Progress Notes (Signed)
Inpatient Diabetes Program Recommendations  AACE/ADA: New Consensus Statement on Inpatient Glycemic Control (2015)  Target Ranges:  Prepandial:   less than 140 mg/dL      Peak postprandial:   less than 180 mg/dL (1-2 hours)      Critically ill patients:  140 - 180 mg/dL   Lab Results  Component Value Date   GLUCAP 115 (H) 10/30/2019   HGBA1C 9.3 (H) 10/29/2019    Review of Glycemic Control Results for [name], [name] (MRN [id number]) as of 10/30/2019 15:25  Ref. Range 10/29/2019 09:43 10/29/2019 21:01 10/30/2019 07:33 10/30/2019 11:51  Glucose-Capillary Latest Ref Range: 70 - 99 mg/dL 884 (H) 166 (H) 063 (H) 115 (H)   Diabetes history: Type 2 DM Outpatient Diabetes medications: Novolin R 5 units BID, NPH 10 units QAM, 20 units QPM Current orders for Inpatient glycemic control:NPH 10 units QAM, 20 units QPM, Novolog 0-15 units TID, Novolog 0-5 units QHS  Inpatient Diabetes Program Recommendations:    Spoke with patient and daughter regarding outpatient diabetes management. Admits to missing doses and not doing as she should. States, "I know I need to do better." Reviewed patient's current A1c of 9.3%. Explained what a A1c is and what it measures. Also reviewed goal A1c with patient, importance of good glucose control @ home, and blood sugar goals. Reviewed patho of DM, need for insulin, role of pancreas, signs and symptoms of hypoglycemia, interventions, vascular changes, impact of infection and commorbidities.  Patient has a meter and testing supplies. Encouraged to check 2-3 times per day. Reviewed when to call MD.  Reviewed plate method, how to read nutritional labels, and encouraged mindfulness. Patient does not have any further questions at this time.   Thanks, Lujean Rave, MSN, RNC-OB Diabetes Coordinator [phone] (8a-5p)

## 2019-10-30 NOTE — Progress Notes (Signed)
Initial Nutrition Assessment  DOCUMENTATION CODES:   Not applicable  INTERVENTION:   -MVI with minerals daily -Glucerna Shake po BID, each supplement provides 220 kcal and 10 grams of protein -1 packet Juven BID, each packet provides 95 calories, 2.5 grams of protein (collagen), and 9.8 grams of carbohydrate (3 grams sugar); also contains 7 grams of L-arginine and L-glutamine, 300 mg vitamin C, 15 mg vitamin E, 1.2 mcg vitamin B-12, 9.5 mg zinc, 200 mg calcium, and 1.5 g  Calcium Beta-hydroxy-Beta-methylbutyrate to support wound healing  NUTRITION DIAGNOSIS:   Increased nutrient needs related to wound healing as evidenced by estimated needs.  GOAL:   Patient will meet greater than or equal to 90% of their needs  MONITOR:   PO intake, Supplement acceptance, Labs, Weight trends, Skin, I & O's  REASON FOR ASSESSMENT:   Consult Wound healing  ASSESSMENT:   [name] is a 74 y.o. female with medical history significant of CAD s/p CABG; AICD placement; afib on Eliquis; hypothyroidism; HTN; HLD; and DM presenting with diabetic foot infection.  Pt admitted with rt diabetic foot infection.   6/23- s/p bedside debridement with podiatry  Reviewed I/O's: +597 ml x 24 hours  Per podiatry notes, awaiting arterial studies and wound cultures to navigate further treatment.   Spoke with pt, who was sitting in recliner chair at time of visit. Pt pleasant and in good spirits, states feeling much better today. She reports she "always has a good appetite- there's nothing wrong with that". Pt reports consuming 100% of her breakfast and asked this RD to assist her in reading nutritional label on her yogurt (RD reviewed this and how to read food labels). Pt usually consumes 2 meals per day, which consist of burgers and fries or a meat, starch and vegetable. Pt explains that she has not been doing a lot of cooking herself, due to her own health problems and due to increased care burden on her  husband, who is bedbound. Meal are generally brought in by friends and family members. Additionally, pt shares she consumes 1 Ensure supplement daily and often snacks on chips, vanilla wafers, and animal crackers.   Pt reports that she has struggled with DM over the past several years 9"it's always been high. The last time I was at the doctor, it got so bad that he didn't even want to look at me"). Pt reports that she has tried to decrease simple carbohydrates, such as chocolate, in her diet and substitute with lower sugar alternatives. She does not have difficulty affording her medications, however, reports inconsistent self-monitoring.   Pt reports that she has decreased sensation in her foot and has had a callous on her right foot for a long time, which she often picks off the skin to decrease the size of. She checks her feet often, but not daily. Discussed importance of self-management and foot care to prevent further complications and to alert MD as soon as possible about any related foot changes, especially given possible neuropathy. Pt shares that bedside debridement was done yesterday by podiatry ("it was gross; it looked like something on Dr. Cristy Hilts"). RD reviewed importance of good glycemic control for support wound healing and how DM is aggressively management during inpatient stay for tighter blood sugar control. Discussed importance of good meal and supplement intake to promote healing.   Reviewed wt hx; wt has been stable over the past year. Pt also denies any weight loss.   Medications reviewed and include demadex.  Lab Results  Component Value Date   HGBA1C 9.3 (H) 10/29/2019  PTA DM medications are Novolin R 5 units BID, NPH 10 units QAM, 20 units QPM.   Labs reviewed: CBGS: 157 (inpatient orders for glycemic control are 0-15 units insulin aspart TID with meals, 0-5 units insulin aspart daily at bedtime, 10 units insulin NPH daily before breakfast, and 20 units insulin NPH  daily before bedtime).   NUTRITION - FOCUSED PHYSICAL EXAM:    Most Recent Value  Orbital Region No depletion  Upper Arm Region No depletion  Thoracic and Lumbar Region No depletion  Buccal Region No depletion  Temple Region No depletion  Clavicle Bone Region No depletion  Clavicle and Acromion Bone Region No depletion  Scapular Bone Region No depletion  Dorsal Hand No depletion  Patellar Region No depletion  Anterior Thigh Region No depletion  Posterior Calf Region No depletion  Edema (RD Assessment) Mild  Hair Reviewed  Eyes Reviewed  Mouth Reviewed  Skin Reviewed  Nails Reviewed       Diet Order:   Diet Order            Diet heart healthy/carb modified Room service appropriate? Yes; Fluid consistency: Thin  Diet effective now                 EDUCATION NEEDS:   Education needs have been addressed  Skin:  Skin Assessment: Skin Integrity Issues: Skin Integrity Issues:: Diabetic Ulcer Diabetic Ulcer: rt foot  Last BM:  10/29/19  Height:   Ht Readings from Last 1 Encounters:  10/29/19 5\' 2"  (1.575 m)    Weight:   Wt Readings from Last 1 Encounters:  10/29/19 64.4 kg    Ideal Body Weight:  50 kg  BMI:  Body mass index is 25.97 kg/m.  Estimated Nutritional Needs:   Kcal:  2993-7169  Protein:  95-110 grams  Fluid:  > 1.7 L    Loistine Chance, RD, LDN, [place] Registered Dietitian II Certified Diabetes Care and Education Specialist Please refer to [facility] for RD and/or RD on-call/weekend/after hours pager

## 2019-10-30 NOTE — Progress Notes (Addendum)
PROGRESS NOTE  [name] VZS:[id number] DOB: [birth year] DOA: 10/28/2019 PCP: Georgianne Fick, MD   LOS: 1 day   Brief narrative: As per HPI,  [name] is a 74 y.o. female with medical history significant for coronary artery disease status post CABG; AICD placement; afib on Eliquis; hypothyroidism; pretension, hyperlipidemia and diabetes mellitus type 2 presented to the hospital with a right foot swelling and pain with redness for last 1 week.  Patient soaked it in Epsom salts but did not get better and she persisted to have  had chills, clammy feeling.  Patient then came to the hospital.  In the ED, patient was noted to have right foot infection.  Temperature of 102 F with lactic of 1.6.  Patient was given IV antibiotics and fluids and podiatry was consulted.  Patient was then admitted to the hospital.   Assessment/Plan:  Principal Problem:   Diabetic infection of right foot (HCC) Active Problems:   CAD- CABG X 4 10/08   Essential hypertension   PAF (paroxysmal atrial fibrillation) (HCC)   Dyslipidemia   Hypothyroid   Chronic systolic CHF (congestive heart failure) (HCC)   Uncontrolled diabetes mellitus type 2 with peripheral artery disease (HCC)  Right diabetic foot infection Dorsal foot erythema with plantar foot callus/hyperkeratotic lesion. Starting to improve with IV antibiotics compared to admission.  X-ray of the foot negative for osteomyelitis.  No signs of sepsis.  Continue IV antibiotics.  Seen by podiatry and local debridement was done with some purulent discharge.Marland Kitchen  ABI studies ordered.  MRI of the foot did not show any evidence of osteomyelitis but soft tissue edema.  Follow wound cultures.  Follow podiatry recommendations.  Improved leukocytosis.  Wound culture was sent.  Will follow wound culture.  Diabetes mellitus type 2.  On NPH insulin at home.  Hemoglobin A1c on 10/29/2019 was 9.3 from 8.8, 2 years back.  Continue with sliding scale, NPH insulin  Accu-Cheks diabetic diet.  POC glucose of 157.  Essential hypertension. -Continue Coreg, Cozaar.  Blood pressure is stable.  Hyperlipidemia -Continue Zocor and Zetia  A. fib. On amiodarone and Coreg.  Eliquis has been resumed.  Chronic combined CHF -Echo in 08/2017 showed EF 25-30% and grade 1 diastolic dysfunction.  Status post AICD.  Will resume diuretics.  Currently compensated.  Hypothyroidism Continue Synthroid.  TSH of 2.5.  CAD s/p CABG No active issues.  Currently on anticoagulation.  Not on aspirin.  Klebsiella UTI.  On Rocephin.  Follow sensitivities.  DVT prophylaxis:  apixaban (ELIQUIS) tablet 5 mg    Code Status: Full code  Family Communication: None today.  Status is: Inpatient  Remains inpatient appropriate because:Inpatient level of care appropriate due to severity of illness and IV antibiotics, follow cultures, podiatry follow-up,  Dispo: The patient is from: Home              Anticipated d/c is to: Home              Anticipated d/c date is: 2 days              Patient currently is not medically stable to d/c.  Consultants:  Podiatry  Procedures:  Debridement of the hyperkeratotic submetatarsal callus  Antibiotics:  Rocephin and metronidazole Anti-infectives (From admission, onward)   Start     Dose/Rate Route Frequency Ordered Stop   10/30/19 1200  vancomycin (VANCOREADY) IVPB 750 mg/150 mL  Status:  Discontinued        750 mg 150 mL/hr over  60 Minutes Intravenous Every 24 hours 10/29/19 1111 10/29/19 1340   10/29/19 1900  cefTRIAXone (ROCEPHIN) 2 g in sodium chloride 0.9 % 100 mL IVPB     Discontinue    "And" Linked Group Details   2 g 200 mL/hr over 30 Minutes Intravenous Every 24 hours 10/29/19 1340     10/29/19 1900  metroNIDAZOLE (FLAGYL) IVPB 500 mg     Discontinue    "And" Linked Group Details   500 mg 100 mL/hr over 60 Minutes Intravenous Every 8 hours 10/29/19 1340     10/29/19 1115  vancomycin (VANCOREADY) IVPB 1250 mg/250  mL        1,250 mg 166.7 mL/hr over 90 Minutes Intravenous  Once 10/29/19 1111 10/29/19 1517   10/29/19 1100  piperacillin-tazobactam (ZOSYN) IVPB 3.375 g  Status:  Discontinued        3.375 g 100 mL/hr over 30 Minutes Intravenous  Once 10/29/19 1058 10/29/19 1503     Subjective: Today, patient was seen and examined at bedside.  Patient denies overt pain.  Denies any fever chills or rigor.  Denies nausea vomiting or diarrhea.  Objective: Vitals:   10/30/19 0116 10/30/19 0540  BP: 114/68 (!) 107/59  Pulse:  73  Resp: 16 16  Temp: 98.7 F (37.1 C) 98.1 F (36.7 C)  SpO2: 95% (!) 89%    Intake/Output Summary (Last 24 hours) at 10/30/2019 0720 Last data filed at 10/30/2019 0400 Gross per 24 hour  Intake 596.95 ml  Output --  Net 596.95 ml   Filed Weights   10/29/19 2134  Weight: 64.4 kg   Body mass index is 25.97 kg/m.   Physical Exam: GENERAL: Patient is alert awake and oriented. Not in obvious distress. HENT: No scleral pallor or icterus. Pupils equally reactive to light. Oral mucosa is moist NECK: is supple, no gross swelling noted. CHEST: Clear to auscultation. No crackles or wheezes.  Diminished breath sounds bilaterally. CVS: S1 and S2 heard, no murmur. Regular rate and rhythm.  ABDOMEN: Soft, non-tender, bowel sounds are present. EXTREMITIES: Erythema of the right dorsal foot and lower leg has improved compared to yesterday.  Callus on the plantar foot on the right, status post debridement and dressing CNS: Cranial nerves are intact. No focal motor deficits. SKIN: warm and dry.  Right foot with erythema, plantar callus.  Data Review: I have personally reviewed the following laboratory data and studies,  CBC: Recent Labs  Lab 10/28/19 2112 10/30/19 0308  WBC 17.1* 7.7  NEUTROABS 14.9*  --   HGB 10.9* 9.8*  HCT 35.5* 31.3*  MCV 93.7 92.3  PLT 287 254   Basic Metabolic Panel: Recent Labs  Lab 10/28/19 2112 10/30/19 0308  NA 134* 140  K 3.6 3.6  CL 98  106  CO2 25 26  GLUCOSE 271* 198*  BUN 21 17  CREATININE 1.41* 1.12*  CALCIUM 8.7* 8.8*   Liver Function Tests: Recent Labs  Lab 10/28/19 2112  AST 49*  ALT 31  ALKPHOS 112  BILITOT 0.6  PROT 7.2  ALBUMIN 3.6   No results for input(s): LIPASE, AMYLASE in the last 168 hours. No results for input(s): AMMONIA in the last 168 hours. Cardiac Enzymes: No results for input(s): CKTOTAL, CKMB, CKMBINDEX, TROPONINI in the last 168 hours. BNP (last 3 results) No results for input(s): BNP in the last 8760 hours.  ProBNP (last 3 results) No results for input(s): PROBNP in the last 8760 hours.  CBG: Recent Labs  Lab 10/29/19 [phone]  10/29/19 2101  GLUCAP 188* 157*   Recent Results (from the past 240 hour(s))  SARS Coronavirus 2 by RT PCR (hospital order, performed in [facility] hospital lab) Nasopharyngeal Nasopharyngeal Swab     Status: None   Collection Time: 10/29/19  1:27 PM   Specimen: Nasopharyngeal Swab  Result Value Ref Range Status   SARS Coronavirus 2 NEGATIVE NEGATIVE Final    Comment: (NOTE) SARS-CoV-2 target nucleic acids are NOT DETECTED.  The SARS-CoV-2 RNA is generally detectable in upper and lower respiratory specimens during the acute phase of infection. The lowest concentration of SARS-CoV-2 viral copies this assay can detect is 250 copies / mL. A negative result does not preclude SARS-CoV-2 infection and should not be used as the sole basis for treatment or other patient management decisions.  A negative result may occur with improper specimen collection / handling, submission of specimen other than nasopharyngeal swab, presence of viral mutation(s) within the areas targeted by this assay, and inadequate number of viral copies (<250 copies / mL). A negative result must be combined with clinical observations, patient history, and epidemiological information.  Fact Sheet for Patients:   StrictlyIdeas.no  Fact Sheet for Healthcare  Providers: BankingDealers.co.za  This test is not yet approved or  cleared by the [place] FDA and has been authorized for detection and/or diagnosis of SARS-CoV-2 by FDA under an Emergency Use Authorization (EUA).  This EUA will remain in effect (meaning this test can be used) for the duration of the COVID-19 declaration under Section 564(b)(1) of the Act, 21 U.S.C. section 360bbb-3(b)(1), unless the authorization is terminated or revoked sooner.  Performed at [facility], [place] [address], [place], [place] [address]   Aerobic Culture (superficial specimen)     Status: None (Preliminary result)   Collection Time: 10/29/19  6:10 PM   Specimen: Foot; Wound  Result Value Ref Range Status   Specimen Description FOOT  Final   Special Requests RIGHT SUBMETATARSAL  Final   Gram Stain   Final    FEW WBC PRESENT, PREDOMINANTLY PMN MODERATE GRAM POSITIVE COCCI IN PAIRS IN CLUSTERS Performed at [facility], [address], [place], [place] [address]    Culture PENDING  Incomplete   Report Status PENDING  Incomplete     Studies: MR FOOT RIGHT WO CONTRAST  Result Date: 10/29/2019 CLINICAL DATA:  Evaluate for osteomyelitis. Diabetic foot infection. Red and swollen. EXAM: MRI OF THE RIGHT FOREFOOT WITHOUT CONTRAST TECHNIQUE: Multiplanar, multisequence MR imaging of the right foot was performed. No intravenous contrast was administered. COMPARISON:  None. FINDINGS: Appear patient motion degrading image quality. Large field of view degrades image quality with significant areas of poor fat saturation. Bones/Joint/Cartilage No fracture or dislocation. Normal alignment. No joint effusion. No areas of bone destruction or periosteal reaction. No aggressive osseous lesion. Chronic tearing signal abnormality in the base of the fifth metatarsal likely related to prior trauma. Ligaments Collateral ligaments are intact.  Lisfranc ligament is intact. Muscles and Tendons  Flexor, peroneal and extensor compartment tendons are intact. Generalized muscle atrophy. Mild T2 hyperintense signal in the plantar musculature likely neurogenic. Soft tissue No fluid collection or hematoma. No soft tissue mass. Soft tissue edema in the subcutaneous fat of the right foot extending into the ankle and lower leg which may be reactive versus secondary to cellulitis. Bandaging material along the plantar aspect of the great toe. IMPRESSION: 1. No evidence of osteomyelitis of right foot. Soft tissue edema of the right foot  extending into the ankle and lower leg concerning for cellulitis versus reactive edema. Electronically Signed   By: Elige Ko   On: 10/29/2019 17:32   DG Foot Complete Right  Result Date: 10/29/2019 CLINICAL DATA:  Dorsal foot redness EXAM: RIGHT FOOT COMPLETE - 3+ VIEW COMPARISON:  09/29/2019 FINDINGS: No acute fracture. No dislocation. Chronic healed fracture at the fifth metatarsal base. Similar degree of arthropathy throughout the foot, most pronounced at the great toe IP joint with unchanged well-defined marginal erosion along the medial surface of the great toe proximal phalanx suggestive of prior crystalline arthropathy such as gout. No new focal erosion or periostitis. Vascular calcifications are noted. Mild diffuse soft tissue swelling. No soft tissue gas. IMPRESSION: 1. No acute findings. No radiographic evidence of acute osteomyelitis. 2. Similar degree of right foot arthropathy, most pronounced at the great toe IP joint. Electronically Signed   By: Duanne Guess D.O.   On: 10/29/2019 11:41     Joycelyn Das, MD  [facility] 10/30/2019

## 2019-10-30 NOTE — TOC Initial Note (Signed)
Transition of Care [facility]) - Initial/Assessment Note    Patient Details  Name: [name] MRN: [id number] Date of Birth: 08/23/1945  Transition of Care [facility]) CM/SW Contact:    Lockie Pares, RN Phone Number: 10/30/2019, 5:29 PM  Clinical Narrative:                 Patient admitted with cellulitis abscess right foot. Debriedment done. On IV antibiotics. Patient eager to get home. 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 Asked if she had help. She has daughter, son in law and grandchildren. She is investigating hiring some help as well.  She denies needs for herself, and feels she can change her own dressings at home. She does not drive, but has several people to call on to drive her to her appointments. She has equipment at home that was her husbands before he was bedridden that she could use if need be. She will let us know if there are any needs she has not thought about.   Expected Discharge Plan: Home/Self Care Barriers to Discharge: No Barriers Identified   Patient Goals and CMS Choice Patient states their goals for this hospitalization and ongoing recovery are:: to get whats needed and et home      Expected Discharge Plan and Services Expected Discharge Plan: Home/Self Care                                              Prior Living Arrangements/Services   Lives with:: Spouse Patient language and need for interpreter reviewed:: Yes Do you feel safe going back to the place where you live?: Yes      Need for Family Participation in Patient Care: Yes (Comment) Care giver support system in place?: Yes (comment)   Criminal Activity/Legal Involvement Pertinent to Current Situation/Hospitalization: No - Comment as needed  Activities of Daily Living      Permission Sought/Granted                  Emotional Assessment Appearance:: Appears stated age Attitude/Demeanor/Rapport: Gracious Affect (typically observed): Pleasant Orientation: : Oriented to  Self, Oriented to Place, Oriented to  Time, Oriented to Situation Alcohol / Substance Use: Not Applicable Psych Involvement: No (comment)  Admission diagnosis:  Diabetic foot infection (HCC) [P32.951, L08.9] Diabetic infection of right foot (HCC) [O84.166, L08.9] Patient Active Problem List   Diagnosis Date Noted  . Diabetic infection of right foot (HCC) 10/29/2019  . Uncontrolled diabetes mellitus type 2 with peripheral artery disease (HCC) 10/29/2019  . CAP (community acquired pneumonia) 02/07/2018  . Ischemic cardiomyopathy 10/05/2017  . Chronic systolic CHF (congestive heart failure) (HCC) 09/03/2017  . Orthostatic hypotension 09/03/2017  . Pleural effusion   . Status post coronary artery stent placement   . Status post thoracentesis   . NSTEMI (non-ST elevated myocardial infarction) (HCC)   . NSVT (nonsustained ventricular tachycardia) (HCC) 02/08/2017  . SOB (shortness of breath)   . Hypertensive heart disease without heart failure   . 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 Diabetes (HCC) 12/17/2012  . Essential hypertension 12/17/2012  . PAF (paroxysmal atrial fibrillation) (HCC) 12/17/2012  . Dyslipidemia 12/17/2012  . Hypothyroid 12/17/2012  . Depression 12/17/2012   PCP:  Georgianne Fick, MD Pharmacy:   [facility] [phone] - [place], [place] - [address] [phone]  [place] [address] Phone: [phone] Fax: [phone]     Social Determinants of Health (SDOH) Interventions    Readmission Risk Interventions No flowsheet data found.

## 2019-10-30 NOTE — Social Work (Signed)
CSW deactivated legal guardian banner as no evidence of court appointed legal guardian.   Octavio Graves, MSW, LCSW [facility] Clinical Social Work

## 2019-10-31 ENCOUNTER — Inpatient Hospital Stay (HOSPITAL_COMMUNITY): Payer: Medicare HMO

## 2019-10-31 DIAGNOSIS — A4902 Methicillin resistant Staphylococcus aureus infection, unspecified site: Secondary | ICD-10-CM | POA: Diagnosis present

## 2019-10-31 DIAGNOSIS — L02611 Cutaneous abscess of right foot: Secondary | ICD-10-CM

## 2019-10-31 DIAGNOSIS — L0390 Cellulitis, unspecified: Secondary | ICD-10-CM

## 2019-10-31 LAB — CBC
HCT: 32.1 % — ABNORMAL LOW (ref 36.0–46.0)
Hemoglobin: 9.7 g/dL — ABNORMAL LOW (ref 12.0–15.0)
MCH: 28.3 pg (ref 26.0–34.0)
MCHC: 30.2 g/dL (ref 30.0–36.0)
MCV: 93.6 fL (ref 80.0–100.0)
Platelets: 256 10*3/uL (ref 150–400)
RBC: 3.43 MIL/uL — ABNORMAL LOW (ref 3.87–5.11)
RDW: 13.2 % (ref 11.5–15.5)
WBC: 7.2 10*3/uL (ref 4.0–10.5)
nRBC: 0 % (ref 0.0–0.2)

## 2019-10-31 LAB — URINE CULTURE: Culture: >=100000 — AB

## 2019-10-31 LAB — BASIC METABOLIC PANEL
Anion gap: 10 (ref 5–15)
BUN: 23 mg/dL (ref 8–23)
CO2: 25 mmol/L (ref 22–32)
Calcium: 8.4 mg/dL — ABNORMAL LOW (ref 8.9–10.3)
Chloride: 105 mmol/L (ref 98–111)
Creatinine, Ser: 1.28 mg/dL — ABNORMAL HIGH (ref 0.44–1.00)
GFR calc Af Amer: 47 mL/min — ABNORMAL LOW (ref >60)
GFR calc non Af Amer: 41 mL/min — ABNORMAL LOW (ref >60)
Glucose, Bld: 62 mg/dL — ABNORMAL LOW (ref 70–99)
Potassium: 3.7 mmol/L (ref 3.5–5.1)
Sodium: 140 mmol/L (ref 135–145)

## 2019-10-31 LAB — GLUCOSE, CAPILLARY
Glucose-Capillary: 45 mg/dL — ABNORMAL LOW (ref 70–99)
Glucose-Capillary: 78 mg/dL (ref 70–99)
Glucose-Capillary: 105 mg/dL — ABNORMAL HIGH (ref 70–99)
Glucose-Capillary: 114 mg/dL — ABNORMAL HIGH (ref 70–99)
Glucose-Capillary: 171 mg/dL — ABNORMAL HIGH (ref 70–99)
Glucose-Capillary: 108 mg/dL — ABNORMAL HIGH (ref 70–99)

## 2019-10-31 LAB — AEROBIC CULTURE W GRAM STAIN (SUPERFICIAL SPECIMEN)

## 2019-10-31 LAB — MAGNESIUM: Magnesium: 1.8 mg/dL (ref 1.7–2.4)

## 2019-10-31 MED ORDER — INSULIN NPH (HUMAN) (ISOPHANE) 100 UNIT/ML ~~LOC~~ SUSP
10.0000 [IU] | Freq: Every day | SUBCUTANEOUS | Status: DC
  Administered 2019-11-01: 10 [IU] via SUBCUTANEOUS
  Filled 2019-10-31: qty 10

## 2019-10-31 MED ORDER — BACID PO TABS
2.0000 | ORAL_TABLET | Freq: Three times a day (TID) | ORAL | Status: DC
  Administered 2019-10-31 – 2019-11-01 (×4): 2 via ORAL
  Filled 2019-10-31 (×4): qty 2

## 2019-10-31 MED ORDER — VANCOMYCIN HCL 1250 MG/250ML IV SOLN
1250.0000 mg | Freq: Once | INTRAVENOUS | Status: DC
  Filled 2019-10-31: qty 250

## 2019-10-31 MED ORDER — DOXYCYCLINE HYCLATE 100 MG PO TABS
100.0000 mg | ORAL_TABLET | Freq: Two times a day (BID) | ORAL | Status: DC
  Administered 2019-10-31 – 2019-11-01 (×3): 100 mg via ORAL
  Filled 2019-10-31 (×3): qty 1

## 2019-10-31 MED ORDER — INSULIN NPH (HUMAN) (ISOPHANE) 100 UNIT/ML ~~LOC~~ SUSP
10.0000 [IU] | Freq: Every day | SUBCUTANEOUS | Status: DC
  Administered 2019-10-31: 10 [IU] via SUBCUTANEOUS
  Filled 2019-10-31: qty 10

## 2019-10-31 NOTE — Progress Notes (Signed)
Hypoglycemic Event  CBG: 45  Treatment: 8 oz juice/soda  Symptoms: Sweaty  Follow-up CBG: Time: 0344 CBG Result:78  Possible Reasons for Event: Inadequate meal intake  Comments/MD notified:NA    Rolland Porter

## 2019-10-31 NOTE — Progress Notes (Signed)
PROGRESS NOTE  [name] [name]:[id number] DOB: 10/16/1945 DOA: 10/28/2019 PCP: Georgianne Fick, MD   LOS: 2 days   Brief narrative: As per HPI,  [name] is a 74 y.o. female with medical history significant for coronary artery disease status post CABG; AICD placement; afib on Eliquis; hypothyroidism; pretension, hyperlipidemia and diabetes mellitus type 2 presented to the hospital with a right foot swelling and pain with redness for last 1 week.  Patient soaked it in Epsom salts but did not get better and she persisted to have  had chills, clammy feeling.  Patient then came to the hospital.  In the ED, patient was noted to have right foot infection.  Temperature of 102 F with lactic of 1.6.  Patient was given IV antibiotics and fluids and podiatry was consulted.  Patient was then admitted to the hospital.  ----------------------------------------------------------------------------------------------------------------------------------------------------------------  Subjective: The patient was seen and examined this morning, remained stable afebrile normotensive.  Has no complaints. No issues overnight with exception of hypoglycemia this morning CBG 45 Responded well to oral or injured/soda CBG improved to 78, Denies any confusion, chest pain or shortness of breath.  Awaiting ABI studies today ------------------------------------------------------------------------------------------------------------------------------------------------------------------    Assessment/Plan:  Principal Problem:   Diabetic infection of right foot (HCC) Active Problems:   CAD- CABG X 4 10/08   Essential hypertension   PAF (paroxysmal atrial fibrillation) (HCC)   Dyslipidemia   Hypothyroid   Chronic systolic CHF (congestive heart failure) (HCC)   Uncontrolled diabetes mellitus type 2 with peripheral artery disease (HCC)  Right diabetic foot infection/ MRSA  Dorsal foot erythema with  plantar foot callus/hyperkeratotic lesion. Starting to improve with IV antibiotics compared to admission.   X-ray of the foot negative for osteomyelitis.  No signs of sepsis.   -Continue IV antibiotics.  Seen by podiatry and local debridement was done with some purulent discharge.Marland Kitchen   ABI studies ordered.... Pending for today  MRI of the foot did not show any evidence of osteomyelitis but soft tissue edema.   Follow wound cultures..... >  MRSA  -Cont. IV Vancomycin -Consulting ID for antibiotic recommendations Follow podiatry recommendations.   Improved leukocytosis, afebrile normotensive  Diabetes mellitus type 2.  Hypoglycemic this morning, CBG 45 responded to oral orange juice, soda CBG improved to 78  On NPH insulin at home.   Hemoglobin A1c on 10/29/2019 was 9.3 from 8.8, 2 years back.   Continue with sliding scale,  NPH insulin --we will titrate dose Accu-Cheks diabetic diet.    Essential hypertension. -Continue Coreg, Cozaar.  Blood pressure is stable.  Hyperlipidemia -Continue Zocor and Zetia  A. fib. On amiodarone and Coreg.  Eliquis has been resumed.  Chronic combined CHF -Echo in 08/2017 showed EF 25-30% and grade 1 diastolic dysfunction.  Status post AICD.  Will resume diuretics.  Currently compensated.  Hypothyroidism Continue Synthroid.  TSH of 2.5.  CAD s/p CABG No active issues.  Currently on anticoagulation.  Not on aspirin.  Klebsiella UTI.  On Rocephin.  Follow sensitivities.  DVT prophylaxis:  apixaban (ELIQUIS) tablet 5 mg    Code Status: Full code  Family Communication: None today.  Status is: Inpatient  Remains inpatient appropriate because:Inpatient level of care appropriate due to severity of illness and IV antibiotics, follow cultures, podiatry follow-up,  Dispo: The patient is from: Home              Anticipated d/c is to: Home  Anticipated d/c date is: 2 days              Patient currently is not medically stable to  d/c.  Consultants:  Podiatry  ID Dr. Cliffton Asters  Procedures:  Debridement of the hyperkeratotic submetatarsal callus  Antibiotics:  Rocephin and metronidazole Anti-infectives (From admission, onward)   Start     Dose/Rate Route Frequency Ordered Stop   10/30/19 1200  vancomycin (VANCOREADY) IVPB 750 mg/150 mL  Status:  Discontinued        750 mg 150 mL/hr over 60 Minutes Intravenous Every 24 hours 10/29/19 1111 10/29/19 1340   10/29/19 1900  cefTRIAXone (ROCEPHIN) 2 g in sodium chloride 0.9 % 100 mL IVPB     Discontinue    "And" Linked Group Details   2 g 200 mL/hr over 30 Minutes Intravenous Every 24 hours 10/29/19 1340     10/29/19 1900  metroNIDAZOLE (FLAGYL) IVPB 500 mg     Discontinue    "And" Linked Group Details   500 mg 100 mL/hr over 60 Minutes Intravenous Every 8 hours 10/29/19 1340     10/29/19 1115  vancomycin (VANCOREADY) IVPB 1250 mg/250 mL        1,250 mg 166.7 mL/hr over 90 Minutes Intravenous  Once 10/29/19 1111 10/29/19 1517   10/29/19 1100  piperacillin-tazobactam (ZOSYN) IVPB 3.375 g  Status:  Discontinued        3.375 g 100 mL/hr over 30 Minutes Intravenous  Once 10/29/19 1058 10/29/19 1503     a.  Objective: Vitals:   10/30/19 2101 10/31/19 0542  BP: (!) 101/59 107/61  Pulse: 66 70  Resp: 18 16  Temp: 97.9 F (36.6 C) (!) 97.5 F (36.4 C)  SpO2: 92% 91%    Intake/Output Summary (Last 24 hours) at 10/31/2019 1041 Last data filed at 10/31/2019 0830 Gross per 24 hour  Intake 1620 ml  Output --  Net 1620 ml   Filed Weights   10/29/19 2134  Weight: 64.4 kg   Body mass index is 25.97 kg/m.     Physical Exam  Constitution:  Alert, cooperative, no distress,  Psychiatric: Normal and stable mood and affect, cognition intact,   HEENT: Normocephalic, PERRL, otherwise with in Normal limits  Chest:Chest symmetric Cardio vascular:  S1/S2, RRR, No murmure, No Rubs or Gallops  pulmonary: Clear to auscultation bilaterally, respirations  unlabored, negative wheezes / crackles Abdomen: Soft, non-tender, non-distended, bowel sounds,no masses, no organomegaly Muscular skeletal: Limited exam - in bed, able to move all 4 extremities, Normal strength,  Neuro: CNII-XII intact. , normal motor and sensation, reflexes intact  Extremities: No pitting edema lower extremities, +2 pulses  Skin: Dry, warm to touch, negative for any Rashes,  open wounds Wounds: Right foot chronic wound, ulcer dressing in place, cellulitic with erythema edema improving   No pain with calf compression, swelling, warmth, erythema          Data Review: I have personally reviewed the following laboratory data and studies,  CBC: Recent Labs  Lab 10/28/19 2112 10/30/19 0308 10/31/19 0225  WBC 17.1* 7.7 7.2  NEUTROABS 14.9*  --   --   HGB 10.9* 9.8* 9.7*  HCT 35.5* 31.3* 32.1*  MCV 93.7 92.3 93.6  PLT 287 254 256   Basic Metabolic Panel: Recent Labs  Lab 10/28/19 2112 10/30/19 0308 10/31/19 0225  NA 134* 140 140  K 3.6 3.6 3.7  CL 98 106 105  CO2 25 26 25   GLUCOSE 271* 198* 62*  BUN 21 17 23   CREATININE 1.41* 1.12* 1.28*  CALCIUM 8.7* 8.8* 8.4*  MG  --   --  1.8   Liver Function Tests: Recent Labs  Lab 10/28/19 2112  AST 49*  ALT 31  ALKPHOS 112  BILITOT 0.6  PROT 7.2  ALBUMIN 3.6   No results for input(s): LIPASE, AMYLASE in the last 168 hours. No results for input(s): AMMONIA in the last 168 hours. Cardiac Enzymes: No results for input(s): CKTOTAL, CKMB, CKMBINDEX, TROPONINI in the last 168 hours. BNP (last 3 results) No results for input(s): BNP in the last 8760 hours.  ProBNP (last 3 results) No results for input(s): PROBNP in the last 8760 hours.  CBG: Recent Labs  Lab 10/30/19 1705 10/30/19 2102 10/31/19 0311 10/31/19 0344 10/31/19 0754  GLUCAP 111* 169* 45* 78 105*   Recent Results (from the past 240 hour(s))  Urine Culture     Status: Abnormal   Collection Time: 10/29/19 10:56 AM   Specimen: Urine,  Clean Catch  Result Value Ref Range Status   Specimen Description URINE, CLEAN CATCH  Final   Special Requests   Final    NONE Performed at [facility], [address] [place], [place] [address]    Culture >=100,000 COLONIES/mL KLEBSIELLA PNEUMONIAE (A)  Final   Report Status 10/31/2019 FINAL  Final   Organism ID, Bacteria KLEBSIELLA PNEUMONIAE (A)  Final      Susceptibility   Klebsiella pneumoniae - MIC*    AMPICILLIN RESISTANT Resistant     CEFAZOLIN <=4 SENSITIVE Sensitive     CEFTRIAXONE <=0.25 SENSITIVE Sensitive     CIPROFLOXACIN <=0.25 SENSITIVE Sensitive     GENTAMICIN <=1 SENSITIVE Sensitive     IMIPENEM <=0.25 SENSITIVE Sensitive     NITROFURANTOIN <=16 SENSITIVE Sensitive     TRIMETH/SULFA <=20 SENSITIVE Sensitive     AMPICILLIN/SULBACTAM 4 SENSITIVE Sensitive     PIP/TAZO <=4 SENSITIVE Sensitive     * >=100,000 COLONIES/mL KLEBSIELLA PNEUMONIAE  Blood culture (routine x 2)     Status: None (Preliminary result)   Collection Time: 10/29/19 12:09 PM   Specimen: BLOOD  Result Value Ref Range Status   Specimen Description BLOOD SITE NOT SPECIFIED  Final   Special Requests AEROBIC BOTTLE ONLY Blood Culture adequate volume  Final   Culture   Final    NO GROWTH < 24 HOURS Performed at [facility], [address] [place], [place] [address]    Report Status PENDING  Incomplete  Blood culture (routine x 2)     Status: None (Preliminary result)   Collection Time: 10/29/19  1:27 PM   Specimen: BLOOD  Result Value Ref Range Status   Specimen Description BLOOD SITE NOT SPECIFIED  Final   Special Requests   Final    BOTTLES DRAWN AEROBIC AND ANAEROBIC Blood Culture adequate volume   Culture   Final    NO GROWTH < 24 HOURS Performed at [facility], [address] [place], [place] [address]    Report Status PENDING  Incomplete  SARS Coronavirus 2 by RT PCR (hospital order, performed in [facility] hospital lab) Nasopharyngeal Nasopharyngeal Swab      Status: None   Collection Time: 10/29/19  1:27 PM   Specimen: Nasopharyngeal Swab  Result Value Ref Range Status   SARS Coronavirus 2 NEGATIVE NEGATIVE Final    Comment: (NOTE) SARS-CoV-2 target nucleic acids are NOT DETECTED.  The SARS-CoV-2 RNA is generally detectable in upper and  lower respiratory specimens during the acute phase of infection. The lowest concentration of SARS-CoV-2 viral copies this assay can detect is 250 copies / mL. A negative result does not preclude SARS-CoV-2 infection and should not be used as the sole basis for treatment or other patient management decisions.  A negative result may occur with improper specimen collection / handling, submission of specimen other than nasopharyngeal swab, presence of viral mutation(s) within the areas targeted by this assay, and inadequate number of viral copies (<250 copies / mL). A negative result must be combined with clinical observations, patient history, and epidemiological information.  Fact Sheet for Patients:   BoilerBrush.com.cy  Fact Sheet for Healthcare Providers: https://pope.com/  This test is not yet approved or  cleared by the [place] FDA and has been authorized for detection and/or diagnosis of SARS-CoV-2 by FDA under an Emergency Use Authorization (EUA).  This EUA will remain in effect (meaning this test can be used) for the duration of the COVID-19 declaration under Section 564(b)(1) of the Act, 21 U.S.C. section 360bbb-3(b)(1), unless the authorization is terminated or revoked sooner.  Performed at [facility], [address] [place], [place] [address]   Aerobic Culture (superficial specimen)     Status: None   Collection Time: 10/29/19  6:10 PM   Specimen: Foot; Wound  Result Value Ref Range Status   Specimen Description FOOT  Final   Special Requests RIGHT SUBMETATARSAL  Final   Gram Stain   Final    FEW WBC PRESENT, PREDOMINANTLY  PMN MODERATE GRAM POSITIVE COCCI IN PAIRS IN CLUSTERS Performed at [facility], [address], [place], [place] [address]    Culture   Final    MODERATE METHICILLIN RESISTANT STAPHYLOCOCCUS AUREUS   Report Status 10/31/2019 FINAL  Final   Organism ID, Bacteria METHICILLIN RESISTANT STAPHYLOCOCCUS AUREUS  Final      Susceptibility   Methicillin resistant staphylococcus aureus - MIC*    CIPROFLOXACIN >=8 RESISTANT Resistant     ERYTHROMYCIN >=8 RESISTANT Resistant     GENTAMICIN <=0.5 SENSITIVE Sensitive     OXACILLIN >=4 RESISTANT Resistant     TETRACYCLINE <=1 SENSITIVE Sensitive     VANCOMYCIN <=0.5 SENSITIVE Sensitive     TRIMETH/SULFA <=10 SENSITIVE Sensitive     CLINDAMYCIN >=8 RESISTANT Resistant     RIFAMPIN <=0.5 SENSITIVE Sensitive     Inducible Clindamycin NEGATIVE Sensitive     * MODERATE METHICILLIN RESISTANT STAPHYLOCOCCUS AUREUS     Studies: MR FOOT RIGHT WO CONTRAST  Result Date: 10/29/2019 CLINICAL DATA:  Evaluate for osteomyelitis. Diabetic foot infection. Red and swollen. EXAM: MRI OF THE RIGHT FOREFOOT WITHOUT CONTRAST TECHNIQUE: Multiplanar, multisequence MR imaging of the right foot was performed. No intravenous contrast was administered. COMPARISON:  None. FINDINGS: Appear patient motion degrading image quality. Large field of view degrades image quality with significant areas of poor fat saturation. Bones/Joint/Cartilage No fracture or dislocation. Normal alignment. No joint effusion. No areas of bone destruction or periosteal reaction. No aggressive osseous lesion. Chronic tearing signal abnormality in the base of the fifth metatarsal likely related to prior trauma. Ligaments Collateral ligaments are intact.  Lisfranc ligament is intact. Muscles and Tendons Flexor, peroneal and extensor compartment tendons are intact. Generalized muscle atrophy. Mild T2 hyperintense signal in the plantar musculature likely neurogenic. Soft tissue No fluid collection or  hematoma. No soft tissue mass. Soft tissue edema in the subcutaneous fat of the right foot extending into the ankle and lower leg which  may be reactive versus secondary to cellulitis. Bandaging material along the plantar aspect of the great toe. IMPRESSION: 1. No evidence of osteomyelitis of right foot. Soft tissue edema of the right foot extending into the ankle and lower leg concerning for cellulitis versus reactive edema. Electronically Signed   By: Kathreen Devoid   On: 10/29/2019 17:32   DG Foot Complete Right  Result Date: 10/29/2019 CLINICAL DATA:  Dorsal foot redness EXAM: RIGHT FOOT COMPLETE - 3+ VIEW COMPARISON:  09/29/2019 FINDINGS: No acute fracture. No dislocation. Chronic healed fracture at the fifth metatarsal base. Similar degree of arthropathy throughout the foot, most pronounced at the great toe IP joint with unchanged well-defined marginal erosion along the medial surface of the great toe proximal phalanx suggestive of prior crystalline arthropathy such as gout. No new focal erosion or periostitis. Vascular calcifications are noted. Mild diffuse soft tissue swelling. No soft tissue gas. IMPRESSION: 1. No acute findings. No radiographic evidence of acute osteomyelitis. 2. Similar degree of right foot arthropathy, most pronounced at the great toe IP joint. Electronically Signed   By: Davina Poke D.O.   On: 10/29/2019 11:41     Deatra James, MD  [facility] 10/31/2019

## 2019-10-31 NOTE — TOC Progression Note (Signed)
Transition of Care [facility]) - Progression Note    Patient Details  Name: [name] MRN: [id number] Date of Birth: 1945-06-17  Transition of Care [facility]) CM/SW Contact  Lockie Pares, RN Phone Number: 10/31/2019, 2:41 PM  Clinical Narrative:     Foot cultured out MRSA< but it is superficial , no osteomyelitis, ID proposing doxycyline PO for 7 days. NO IV ABX needed.  No HH or fDME identified.   Expected Discharge Plan: Home/Self Care Barriers to Discharge: No Barriers Identified  Expected Discharge Plan and Services Expected Discharge Plan: Home/Self Care                                               Social Determinants of Health (SDOH) Interventions    Readmission Risk Interventions No flowsheet data found.

## 2019-10-31 NOTE — Progress Notes (Signed)
Patient ID: [name], female   DOB: 02-06-1946, 74 y.o.   MRN: [id number]         [facility]    Date of Admission:  10/28/2019           Day 3 ceftriaxone        Day 3 metronidazole       Reason for Consult: Diabetic foot infection    Referring Provider: Dr. Nevin Bloodgood   Assessment: [name] has superficial MRSA abscess that is much improved even without specific therapy for MRSA.  I would treat her with 7 days of oral doxycycline.  Plan: 1. Doxycycline 100 mg twice daily for 7 days 2. 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 amiodarone  100 mg Oral Daily  . apixaban  5 mg Oral BID  . vitamin C  500 mg Oral Daily  . carvedilol  6.25 mg Oral BID WC  . cholecalciferol  1,000 Units Oral BID  . ezetimibe  10 mg Oral Daily  . feeding supplement (GLUCERNA SHAKE)  237 mL Oral BID BM  . insulin aspart  0-15 Units Subcutaneous TID WC  . insulin aspart  0-5 Units Subcutaneous QHS  . insulin NPH Human  10 Units Subcutaneous QHS   And  . [START ON 11/01/2019] insulin NPH Human  10 Units Subcutaneous QAC breakfast  . lactobacillus acidophilus  2 tablet Oral TID  . levothyroxine  50 mcg Oral Q0600  . losartan  25 mg Oral Daily  . multivitamin with minerals  1 tablet Oral Daily  . nutrition supplement (JUVEN)  1 packet Oral BID BM  . simvastatin  40 mg Oral QHS  . torsemide  20 mg Oral Daily  . venlafaxine XR  150 mg Oral Daily   Continuous Infusions: . cefTRIAXone (ROCEPHIN)  IV 2 g (10/30/19 1917)   And  . metronidazole 500 mg  (10/31/19 0942)   PRN Meds:.acetaminophen **OR** acetaminophen, morphine injection, ondansetron **OR** ondansetron (ZOFRAN) IV  HPI: [name] is a 74 y.o. female diabetic who had sudden onset of swelling, pain, redness and warmth in her right foot and lower leg about 10 days ago.  [name] had associated fever, chills or sweats leading to admission 3 days ago.  [name] was febrile to 102.2 degrees with a white count of 17,100.  [name] was noted to have a plantar callus under her right first metatarsal head with surrounding blister.  It was debrided in the ED and pus was cultured showing MRSA.  [name] promptly defervesced.   Review of Systems: Review of Systems  Constitutional: Positive for chills, diaphoresis and fever.  Musculoskeletal: Positive for joint pain.    Past Medical History:  Diagnosis Date  . Cholelithiases   . Coronary artery disease 02/2007   CABG x4  . Diabetes (HCC)   . Dyslipidemia   . HTN (hypertension)   . Hypothyroid   . PAF (paroxysmal atrial fibrillation) (HCC)    post op    Social History   Tobacco Use  . Smoking status: Never Smoker  .  Smokeless tobacco: Never Used  Vaping Use  . Vaping Use: Never used  Substance Use Topics  . Alcohol use: No  . Drug use: No    Family History  Problem Relation Age of Onset  . Diabetes Mother 74  . Heart disease Mother   . Hyperlipidemia Mother   . Cancer Father   . Heart attack Father 60  . Cancer Maternal Grandmother   . Cancer Paternal Grandmother   . Thyroid disease Sister   . Diabetes Child    Allergies  Allergen Reactions  . Crestor [Rosuvastatin] Other (See Comments)    unknown  . Lipitor [Atorvastatin] Other (See Comments)    Very bad pain in hips and joints    OBJECTIVE: Blood pressure 107/61, pulse 70, temperature (!) 97.5 F (36.4 C), temperature source Oral, resp. rate 16, height 5\' 2"  (1.575 m), weight 64.4 kg, SpO2 91 %.  Physical Exam Constitutional:      Comments: [name] is currently out of  her room for ABI testing.  I discussed her case with Dr. who reported that [name] is doing much better.  See serial photos below.           Lab Results Lab Results  Component Value Date   WBC 7.2 10/31/2019   HGB 9.7 (L) 10/31/2019   HCT 32.1 (L) 10/31/2019   MCV 93.6 10/31/2019   PLT 256 10/31/2019    Lab Results  Component Value Date   CREATININE 1.28 (H) 10/31/2019   BUN 23 10/31/2019   NA 140 10/31/2019   K 3.7 10/31/2019   CL 105 10/31/2019   CO2 25 10/31/2019    Lab Results  Component Value Date   ALT 31 10/28/2019   AST 49 (H) 10/28/2019   ALKPHOS 112 10/28/2019   BILITOT 0.6 10/28/2019     Microbiology: Recent Results (from the past 240 hour(s))  Urine Culture     Status: Abnormal   Collection Time: 10/29/19 10:56 AM   Specimen: Urine, Clean Catch  Result Value Ref Range Status   Specimen Description URINE, CLEAN CATCH  Final   Special Requests   Final    NONE Performed at [facility], [address], [place], [place]    Culture >=100,000 COLONIES/mL KLEBSIELLA PNEUMONIAE (A)  Final   Report Status 10/31/2019 FINAL  Final   Organism ID, Bacteria KLEBSIELLA PNEUMONIAE (A)  Final      Susceptibility   Klebsiella pneumoniae - MIC*    AMPICILLIN RESISTANT Resistant     CEFAZOLIN <=4 SENSITIVE Sensitive     CEFTRIAXONE <=0.25 SENSITIVE Sensitive     CIPROFLOXACIN <=0.25 SENSITIVE Sensitive     GENTAMICIN <=1 SENSITIVE Sensitive     IMIPENEM <=0.25 SENSITIVE Sensitive     NITROFURANTOIN <=16 SENSITIVE Sensitive     TRIMETH/SULFA <=20 SENSITIVE Sensitive     AMPICILLIN/SULBACTAM 4 SENSITIVE Sensitive     PIP/TAZO <=4 SENSITIVE Sensitive     * >=100,000 COLONIES/mL KLEBSIELLA PNEUMONIAE  Blood culture (routine x 2)     Status: None (Preliminary result)   Collection Time: 10/29/19 12:09 PM   Specimen: BLOOD  Result Value Ref Range Status   Specimen Description BLOOD SITE NOT SPECIFIED  Final   Special Requests AEROBIC BOTTLE  ONLY Blood Culture adequate volume  Final   Culture   Final    NO GROWTH < 24 HOURS Performed at [facility], [address] [place], [place]    Report Status PENDING  Incomplete  Blood culture (routine x 2)     Status: None (Preliminary result)   Collection Time: 10/29/19  1:27 PM   Specimen: BLOOD  Result Value Ref Range Status   Specimen Description BLOOD SITE NOT SPECIFIED  Final   Special Requests   Final    BOTTLES DRAWN AEROBIC AND ANAEROBIC Blood Culture adequate volume   Culture   Final    NO GROWTH < 24 HOURS Performed at [facility], [place] [address] [place], [place] [address]    Report Status PENDING  Incomplete  SARS Coronavirus 2 by RT PCR (hospital order, performed in [facility] hospital lab) Nasopharyngeal Nasopharyngeal Swab     Status: None   Collection Time: 10/29/19  1:27 PM   Specimen: Nasopharyngeal Swab  Result Value Ref Range Status   SARS Coronavirus 2 NEGATIVE NEGATIVE Final    Comment: (NOTE) SARS-CoV-2 target nucleic acids are NOT DETECTED.  The SARS-CoV-2 RNA is generally detectable in upper and lower respiratory specimens during the acute phase of infection. The lowest concentration of SARS-CoV-2 viral copies this assay can detect is 250 copies / mL. A negative result does not preclude SARS-CoV-2 infection and should not be used as the sole basis for treatment or other patient management decisions.  A negative result may occur with improper specimen collection / handling, submission of specimen other than nasopharyngeal swab, presence of viral mutation(s) within the areas targeted by this assay, and inadequate number of viral copies (<250 copies / mL). A negative result must be combined with clinical observations, patient history, and epidemiological information.  Fact Sheet for Patients:   StrictlyIdeas.no  Fact Sheet for Healthcare Providers: BankingDealers.co.za  This  test is not yet approved or  cleared by the [place] FDA and has been authorized for detection and/or diagnosis of SARS-CoV-2 by FDA under an Emergency Use Authorization (EUA).  This EUA will remain in effect (meaning this test can be used) for the duration of the COVID-19 declaration under Section 564(b)(1) of the Act, 21 U.S.C. section 360bbb-3(b)(1), unless the authorization is terminated or revoked sooner.  Performed at [facility], [place] [address] [place], [place] [address]   Aerobic Culture (superficial specimen)     Status: None   Collection Time: 10/29/19  6:10 PM   Specimen: Foot; Wound  Result Value Ref Range Status   Specimen Description FOOT  Final   Special Requests RIGHT SUBMETATARSAL  Final   Gram Stain   Final    FEW WBC PRESENT, PREDOMINANTLY PMN MODERATE GRAM POSITIVE COCCI IN PAIRS IN CLUSTERS Performed at [facility], [address], [place], [place] [address]    Culture   Final    MODERATE METHICILLIN RESISTANT STAPHYLOCOCCUS AUREUS   Report Status 10/31/2019 FINAL  Final   Organism ID, Bacteria METHICILLIN RESISTANT STAPHYLOCOCCUS AUREUS  Final      Susceptibility   Methicillin resistant staphylococcus aureus - MIC*    CIPROFLOXACIN >=8 RESISTANT Resistant     ERYTHROMYCIN >=8 RESISTANT Resistant     GENTAMICIN <=0.5 SENSITIVE Sensitive     OXACILLIN >=4 RESISTANT Resistant     TETRACYCLINE <=1 SENSITIVE Sensitive     VANCOMYCIN <=0.5 SENSITIVE Sensitive     TRIMETH/SULFA <=10 SENSITIVE Sensitive     CLINDAMYCIN >=8 RESISTANT Resistant     RIFAMPIN <=0.5 SENSITIVE Sensitive     Inducible Clindamycin NEGATIVE Sensitive     * MODERATE METHICILLIN RESISTANT STAPHYLOCOCCUS AUREUS    Michel Bickers, MD [facility] [phone] pager   [phone] cell 10/31/2019, 11:30 AM

## 2019-10-31 NOTE — Discharge Instructions (Signed)

## 2019-10-31 NOTE — Progress Notes (Signed)
Vascular ABI w/ TBI study completed.   See Cv Proc for preliminary results.   Virlan Kempker  

## 2019-10-31 NOTE — Progress Notes (Signed)
Inpatient Diabetes Program Recommendations  AACE/ADA: New Consensus Statement on Inpatient Glycemic Control (2015)  Target Ranges:  Prepandial:   less than 140 mg/dL      Peak postprandial:   less than 180 mg/dL (1-2 hours)      Critically ill patients:  140 - 180 mg/dL   Lab Results  Component Value Date   GLUCAP 105 (H) 10/31/2019   HGBA1C 9.3 (H) 10/29/2019    Review of Glycemic Control Results for [name], [name] (MRN [id number]) as of 10/31/2019 11:07  Ref. Range 10/30/2019 21:02 10/31/2019 03:11 10/31/2019 03:44 10/31/2019 07:54  Glucose-Capillary Latest Ref Range: 70 - 99 mg/dL 967 (H) 45 (L) 78 591 (H)   Diabetes history: Type 2 DM Outpatient Diabetes medications: Novolin R 5 units BID, NPH 10 units QAM, 20 units QPM Current orders for Inpatient glycemic control:NPH 10 units QAM, 10 units QPM, Novolog 0-15 units TID, Novolog 0-5 units QHS  Inpatient Diabetes Program Recommendations:    Noted hypoglycemic event this AM of 45 mg/dL and subsequent insulin changes. In agreement.  Thanks, Lujean Rave, MSN, RNC-OB Diabetes Coordinator [phone] (8a-5p)

## 2019-11-01 DIAGNOSIS — I10 Essential (primary) hypertension: Secondary | ICD-10-CM

## 2019-11-01 LAB — GLUCOSE, CAPILLARY: Glucose-Capillary: 89 mg/dL (ref 70–99)

## 2019-11-01 MED ORDER — DOXYCYCLINE HYCLATE 100 MG PO TABS: 100.0000 mg | ORAL_TABLET | Freq: Two times a day (BID) | ORAL | 0 refills | Status: AC

## 2019-11-01 NOTE — Progress Notes (Signed)
Subjective: 74 year old female presented to emergency department for cellulitis, superficial abscess to right foot.  Underwent debridement in the emergency department and drainage of a superficial abscess.  Wound cultures obtained.  ID has been consulted today for antibiotic recommendations.  She states that she is doing well on.  Denies any current fevers, chills, nausea, vomiting.  Had nausea overnight but this has resolved.  Objective: NAD-laying in bed DP/PT pulses palpable 1/4 bilaterally, CRT less than 3 seconds Superficial wound right foot submetatarsal 1 area and there does appear to have some evidence of healing.  There is no other open lesions.  There is no there is no probing or areas of fluctuation crepitation.  Erythema is significantly improved. No pain with calf compression, swelling, warmth, erythema   Assessment: Cellulitis with improvement  Plan: Wound culture growing MRSA and ID has been consulted for antibiotic recommendations.  Overall cellulitis is much improved.  Continue local wound care.  Adaptic was applied followed by dry sterile dressing.  Weight-bear as tolerated in surgical shoe.  From my standpoint can be discharged tomorrow with oral antibiotics.  I will see her back in the office either Wednesday or Thursday of next week.  We will arrange this for her.  Ovid Curd, DPM O: [phone] C: [phone]

## 2019-11-01 NOTE — Discharge Summary (Signed)
Physician Discharge Summary  Tara Barterolly K Echavarria WUJ:[id number] DOB: 1946/01/13 DOA: 10/28/2019  PCP: Georgianne Fickamachandran, Ajith, MD  Admit date: 10/28/2019 Discharge date: 11/01/2019  Admitted From: home Disposition:  home  Recommendations for Outpatient Follow-up:  1. Follow up with PCP in 1-2 weeks 2. Follow-up with Dr. Ardelle AntonWagoner with podiatry next week as scheduled  Home Health: none Equipment/Devices: none  Discharge Condition: stable CODE STATUS: Full code Diet recommendation: regular  HPI: Per admitting MD, [name] is a 74 y.o. female with medical history significant of CAD s/p CABG; AICD placement; afib on Eliquis; hypothyroidism; HTN; HLD; and DM presenting with diabetic foot infection.   Her right foot swelled last weekend.  It has worsened since.  It is painful and hurts to walk on it. It is red and swollen and painful.  Yesterday, she soaked it in Epson salts and she got nauseated and had to go lie down.  They came to the ER last night - had chills, clammy.  Glucose was 269.  Her foot is "black".  Hospital Course / Discharge diagnoses: Right diabetic foot infection/ MRSA  -patient was admitted to the hospital with diabetic foot infection, cellulitis as well as a foot callus/hyperkeratotic lesion.  She was initially placed on broad-spectrum intravenous antibiotics and podiatry and infectious disease were consulted.  She underwent an MRI of the foot which was negative for osteomyelitis.  Her leukocytosis resolved.  She underwent local debridement with some purulent discharge, cultures were sent and speciated MRSA.  She improved with IV antibiotics, and she was transitioned to oral doxycycline per infectious disease and she is to continue that for 7 days.  Clinically improved, she will be discharged home in stable condition with close outpatient podiatry follow-up.  Diabetes mellitus type 2 -resume home regimen Essential hypertension -Continue Coreg, Cozaar.  Blood pressure is  stable. Hyperlipidemia -Continue Zocor and Zetia A. Fib. - On amiodarone and Coreg.  Eliquis has been resumed. Chronic combined CHF -Echo in 08/2017 showed EF 25-30% and grade 1 diastolic dysfunction.  Status post AICD. Currently compensated. Hypothyroidism -Continue Synthroid.  TSH of 2.5. CAD s/p CABG -No active issues. Klebsiella UTI- Received broad spectrum antibiotics while here Chronic kidney disease stage IIIb-Baseline creatinine 1.2-1.5, currently at baseline  Discharge Instructions   Allergies as of 11/01/2019      Reactions   Crestor [rosuvastatin] Other (See Comments)   unknown   Lipitor [atorvastatin] Other (See Comments)   Very bad pain in hips and joints      Medication List    TAKE these medications   amiodarone 200 MG tablet Commonly known as: PACERONE Take 0.5 tablets (100 mg total) by mouth daily.   apixaban 5 MG Tabs tablet Commonly known as: Eliquis Take 1 tablet (5 mg total) by mouth 2 (two) times daily.   carvedilol 6.25 MG tablet Commonly known as: COREG TAKE 1 TABLET (6.25 MG TOTAL) BY MOUTH 2 (TWO) TIMES DAILY WITH A MEAL (DOSE INCREASE) What changed: See the new instructions.   cholecalciferol 1000 units tablet Commonly known as: VITAMIN D Take 1,000 Units by mouth 2 (two) times daily.   doxycycline 100 MG tablet Commonly known as: VIBRA-TABS Take 1 tablet (100 mg total) by mouth every 12 (twelve) hours for 7 days.   ezetimibe 10 MG tablet Commonly known as: ZETIA Take 1 tablet (10 mg total) by mouth daily.   feeding supplement (ENSURE ENLIVE) Liqd Take 237 mLs by mouth daily.   ferrous sulfate 325 (65 FE) MG EC  tablet Take 325 mg by mouth daily.   levothyroxine 50 MCG tablet Commonly known as: SYNTHROID TAKE 1 TABLET EVERY DAY What changed: when to take this   losartan 25 MG tablet Commonly known as: COZAAR TAKE 1 TABLET EVERY EVENING What changed: when to take this   NOVOLIN N RELION Protection Inject 10-20 Units into the skin See  admin instructions. Taking 10 units in the AM and 20 units at bedtime   NovoLIN R 100 units/mL injection Generic drug: insulin regular Inject 5 Units into the skin 2 (two) times daily before a meal.   simvastatin 40 MG tablet Commonly known as: ZOCOR TAKE 1 TABLET AT BEDTIME   torsemide 20 MG tablet Commonly known as: DEMADEX Take 20 mg by mouth daily.   venlafaxine XR 150 MG 24 hr capsule Commonly known as: EFFEXOR-XR Take 1 capsule (150 mg total) by mouth daily.   VITAMIN B-12 PO Take 1 tablet by mouth daily.   vitamin C 500 MG tablet Commonly known as: ASCORBIC ACID Take 500 mg by mouth daily.       Follow-up Information    Trula Slade, DPM. Schedule an appointment as soon as possible for a visit in 1 week(s).   Specialty: Podiatry Contact information: [address] [place] [address] [phone]               Consultations:  Podiatry   ID  Procedures/Studies:  MR FOOT RIGHT WO CONTRAST  Result Date: 10/29/2019 CLINICAL DATA:  Evaluate for osteomyelitis. Diabetic foot infection. Red and swollen. EXAM: MRI OF THE RIGHT FOREFOOT WITHOUT CONTRAST TECHNIQUE: Multiplanar, multisequence MR imaging of the right foot was performed. No intravenous contrast was administered. COMPARISON:  None. FINDINGS: Appear patient motion degrading image quality. Large field of view degrades image quality with significant areas of poor fat saturation. Bones/Joint/Cartilage No fracture or dislocation. Normal alignment. No joint effusion. No areas of bone destruction or periosteal reaction. No aggressive osseous lesion. Chronic tearing signal abnormality in the base of the fifth metatarsal likely related to prior trauma. Ligaments Collateral ligaments are intact.  Lisfranc ligament is intact. Muscles and Tendons Flexor, peroneal and extensor compartment tendons are intact. Generalized muscle atrophy. Mild T2 hyperintense signal in the plantar musculature likely  neurogenic. Soft tissue No fluid collection or hematoma. No soft tissue mass. Soft tissue edema in the subcutaneous fat of the right foot extending into the ankle and lower leg which may be reactive versus secondary to cellulitis. Bandaging material along the plantar aspect of the great toe. IMPRESSION: 1. No evidence of osteomyelitis of right foot. Soft tissue edema of the right foot extending into the ankle and lower leg concerning for cellulitis versus reactive edema. Electronically Signed   By: Kathreen Devoid   On: 10/29/2019 17:32   DG Foot Complete Right  Result Date: 10/29/2019 CLINICAL DATA:  Dorsal foot redness EXAM: RIGHT FOOT COMPLETE - 3+ VIEW COMPARISON:  09/29/2019 FINDINGS: No acute fracture. No dislocation. Chronic healed fracture at the fifth metatarsal base. Similar degree of arthropathy throughout the foot, most pronounced at the great toe IP joint with unchanged well-defined marginal erosion along the medial surface of the great toe proximal phalanx suggestive of prior crystalline arthropathy such as gout. No new focal erosion or periostitis. Vascular calcifications are noted. Mild diffuse soft tissue swelling. No soft tissue gas. IMPRESSION: 1. No acute findings. No radiographic evidence of acute osteomyelitis. 2. Similar degree of right foot arthropathy, most pronounced at the great  toe IP joint. Electronically Signed   By: Duanne Guess D.O.   On: 10/29/2019 11:41   DG Foot Complete Right  Result Date: 10/07/2019 Please see detailed radiograph report in office note.  VAS [place] ABI WITH/WO TBI  Result Date: 11/01/2019 LOWER EXTREMITY DOPPLER STUDY Indications: Ulceration. High Risk         Hypertension, hyperlipidemia, Diabetes, coronary artery Factors:          disease.  Comparison Study: Prior study 09-28-2017 Performing Technologist: Jean Rosenthal  Examination Guidelines: A complete evaluation includes at minimum, Doppler waveform signals and systolic blood pressure reading at the  level of bilateral brachial, anterior tibial, and posterior tibial arteries, when vessel segments are accessible. Bilateral testing is considered an integral part of a complete examination. Photoelectric Plethysmograph (PPG) waveforms and toe systolic pressure readings are included as required and additional duplex testing as needed. Limited examinations for reoccurring indications may be performed as noted.  ABI Findings: +---------+------------------+-----+---------+--------+ Right    Rt Pressure (mmHg)IndexWaveform Comment  +---------+------------------+-----+---------+--------+ Brachial 113                    triphasic         +---------+------------------+-----+---------+--------+ PTA      132               1.13 triphasic         +---------+------------------+-----+---------+--------+ DP       254               2.17 triphasicNC       +---------+------------------+-----+---------+--------+ Great Toe63                0.54 Abnormal          +---------+------------------+-----+---------+--------+ +---------+------------------+-----+---------+-------+ Left     Lt Pressure (mmHg)IndexWaveform Comment +---------+------------------+-----+---------+-------+ Brachial 117                    triphasic        +---------+------------------+-----+---------+-------+ PTA      254               2.17 triphasicNC      +---------+------------------+-----+---------+-------+ DP       254               2.17 triphasicNC      +---------+------------------+-----+---------+-------+ Great Toe45                0.38 Abnormal         +---------+------------------+-----+---------+-------+ +-------+-----------+-----------+------------+------------+ ABI/TBIToday's ABIToday's TBIPrevious ABIPrevious TBI +-------+-----------+-----------+------------+------------+ Right  1.13       0.54       1.32        0.56         +-------+-----------+-----------+------------+------------+  Left   2.17       0.38       2.05        0.41         +-------+-----------+-----------+------------+------------+ Arterial wall calcification precludes accurate ankle pressures and ABIs. Right ABIs appear essentially unchanged. Left ABIs appear essentially unchanged.  Summary: Right: Resting right ankle-brachial index is within normal range. No evidence of significant right lower extremity arterial disease. The right toe-brachial index is abnormal. Left: Resting left ankle-brachial index indicates noncompressible left lower extremity arteries. The left toe-brachial index is abnormal.  *See table(s) above for measurements and observations.  Electronically signed by Waverly Ferrari MD on 11/01/2019 at 6:24:12 AM.    Final      Subjective: - no chest pain,  shortness of breath, no abdominal pain, nausea or vomiting.   Discharge Exam: BP (!) 106/58 (BP Location: Right Arm)   Pulse 71   Temp 97.7 F (36.5 C) (Oral)   Resp 15   Ht 5\' 2"  (1.575 m)   Wt 64.4 kg   SpO2 93%   BMI 25.97 kg/m   General: Pt is alert, awake, not in acute distress Cardiovascular: RRR, S1/S2 +, no rubs, no gallops Respiratory: CTA bilaterally, no wheezing, no rhonchi Abdominal: Soft, NT, ND, bowel sounds + Extremities: no edema, no cyanosis  The results of significant diagnostics from this hospitalization (including imaging, microbiology, ancillary and laboratory) are listed below for reference.     Microbiology: Recent Results (from the past 240 hour(s))  Urine Culture     Status: Abnormal   Collection Time: 10/29/19 10:56 AM   Specimen: Urine, Clean Catch  Result Value Ref Range Status   Specimen Description URINE, CLEAN CATCH  Final   Special Requests   Final    NONE Performed at [facility], [address], [place], [place]    Culture >=100,000 COLONIES/mL KLEBSIELLA PNEUMONIAE (A)  Final   Report Status 10/31/2019 FINAL  Final   Organism ID, Bacteria KLEBSIELLA PNEUMONIAE (A)   Final      Susceptibility   Klebsiella pneumoniae - MIC*    AMPICILLIN RESISTANT Resistant     CEFAZOLIN <=4 SENSITIVE Sensitive     CEFTRIAXONE <=0.25 SENSITIVE Sensitive     CIPROFLOXACIN <=0.25 SENSITIVE Sensitive     GENTAMICIN <=1 SENSITIVE Sensitive     IMIPENEM <=0.25 SENSITIVE Sensitive     NITROFURANTOIN <=16 SENSITIVE Sensitive     TRIMETH/SULFA <=20 SENSITIVE Sensitive     AMPICILLIN/SULBACTAM 4 SENSITIVE Sensitive     PIP/TAZO <=4 SENSITIVE Sensitive     * >=100,000 COLONIES/mL KLEBSIELLA PNEUMONIAE  Blood culture (routine x 2)     Status: None (Preliminary result)   Collection Time: 10/29/19 12:09 PM   Specimen: BLOOD  Result Value Ref Range Status   Specimen Description BLOOD SITE NOT SPECIFIED  Final   Special Requests   Final    BOTTLES DRAWN AEROBIC ONLY Blood Culture adequate volume   Culture   Final    NO GROWTH 3 DAYS Performed at [facility], [address] [place], [place]    Report Status PENDING  Incomplete  Blood culture (routine x 2)     Status: None (Preliminary result)   Collection Time: 10/29/19  1:27 PM   Specimen: BLOOD  Result Value Ref Range Status   Specimen Description BLOOD SITE NOT SPECIFIED  Final   Special Requests   Final    BOTTLES DRAWN AEROBIC AND ANAEROBIC Blood Culture adequate volume   Culture   Final    NO GROWTH 3 DAYS Performed at [facility], [address] [place], [place]    Report Status PENDING  Incomplete  SARS Coronavirus 2 by RT PCR (hospital order, performed in [facility] hospital lab) Nasopharyngeal Nasopharyngeal Swab     Status: None   Collection Time: 10/29/19  1:27 PM   Specimen: Nasopharyngeal Swab  Result Value Ref Range Status   SARS Coronavirus 2 NEGATIVE NEGATIVE Final    Comment: (NOTE) SARS-CoV-2 target nucleic acids are NOT DETECTED.  The SARS-CoV-2 RNA is generally detectable in upper and lower respiratory specimens during the acute phase of infection. The  lowest concentration of SARS-CoV-2 viral copies this assay can detect is  250 copies / mL. A negative result does not preclude SARS-CoV-2 infection and should not be used as the sole basis for treatment or other patient management decisions.  A negative result may occur with improper specimen collection / handling, submission of specimen other than nasopharyngeal swab, presence of viral mutation(s) within the areas targeted by this assay, and inadequate number of viral copies (<250 copies / mL). A negative result must be combined with clinical observations, patient history, and epidemiological information.  Fact Sheet for Patients:   BoilerBrush.com.cy  Fact Sheet for Healthcare Providers: https://pope.com/  This test is not yet approved or  cleared by the [place] FDA and has been authorized for detection and/or diagnosis of SARS-CoV-2 by FDA under an Emergency Use Authorization (EUA).  This EUA will remain in effect (meaning this test can be used) for the duration of the COVID-19 declaration under Section 564(b)(1) of the Act, 21 U.S.C. section 360bbb-3(b)(1), unless the authorization is terminated or revoked sooner.  Performed at [facility], [address] [place], [place] [address]   Aerobic Culture (superficial specimen)     Status: None   Collection Time: 10/29/19  6:10 PM   Specimen: Foot; Wound  Result Value Ref Range Status   Specimen Description FOOT  Final   Special Requests RIGHT SUBMETATARSAL  Final   Gram Stain   Final    FEW WBC PRESENT, PREDOMINANTLY PMN MODERATE GRAM POSITIVE COCCI IN PAIRS IN CLUSTERS Performed at [facility], [address], [place], [place] [address]    Culture   Final    MODERATE METHICILLIN RESISTANT STAPHYLOCOCCUS AUREUS   Report Status 10/31/2019 FINAL  Final   Organism ID, Bacteria METHICILLIN RESISTANT STAPHYLOCOCCUS AUREUS  Final      Susceptibility   Methicillin  resistant staphylococcus aureus - MIC*    CIPROFLOXACIN >=8 RESISTANT Resistant     ERYTHROMYCIN >=8 RESISTANT Resistant     GENTAMICIN <=0.5 SENSITIVE Sensitive     OXACILLIN >=4 RESISTANT Resistant     TETRACYCLINE <=1 SENSITIVE Sensitive     VANCOMYCIN <=0.5 SENSITIVE Sensitive     TRIMETH/SULFA <=10 SENSITIVE Sensitive     CLINDAMYCIN >=8 RESISTANT Resistant     RIFAMPIN <=0.5 SENSITIVE Sensitive     Inducible Clindamycin NEGATIVE Sensitive     * MODERATE METHICILLIN RESISTANT STAPHYLOCOCCUS AUREUS     Labs: Basic Metabolic Panel: Recent Labs  Lab 10/28/19 2112 10/30/19 0308 10/31/19 0225  NA 134* 140 140  K 3.6 3.6 3.7  CL 98 106 105  CO2 25 26 25   GLUCOSE 271* 198* 62*  BUN 21 17 23   CREATININE 1.41* 1.12* 1.28*  CALCIUM 8.7* 8.8* 8.4*  MG  --   --  1.8   Liver Function Tests: Recent Labs  Lab 10/28/19 2112  AST 49*  ALT 31  ALKPHOS 112  BILITOT 0.6  PROT 7.2  ALBUMIN 3.6   CBC: Recent Labs  Lab 10/28/19 2112 10/30/19 0308 10/31/19 0225  WBC 17.1* 7.7 7.2  NEUTROABS 14.9*  --   --   HGB 10.9* 9.8* 9.7*  HCT 35.5* 31.3* 32.1*  MCV 93.7 92.3 93.6  PLT 287 254 256   CBG: Recent Labs  Lab 10/31/19 0754 10/31/19 1217 10/31/19 1717 10/31/19 2049 11/01/19 0823  GLUCAP 105* 114* 171* 108* 89   Hgb A1c Recent Labs    10/29/19 1508  HGBA1C 9.3*   Lipid Profile No results for input(s): CHOL, HDL, LDLCALC, TRIG, CHOLHDL, LDLDIRECT in the last  72 hours. Thyroid function studies Recent Labs    10/29/19 1508  TSH 2.586   Urinalysis    Component Value Date/Time   COLORURINE YELLOW 10/28/2019 2301   APPEARANCEUR HAZY (A) 10/28/2019 2301   LABSPEC 1.009 10/28/2019 2301   PHURINE 5.0 10/28/2019 2301   GLUCOSEU NEGATIVE 10/28/2019 2301   HGBUR SMALL (A) 10/28/2019 2301   BILIRUBINUR NEGATIVE 10/28/2019 2301   BILIRUBINUR moderate 09/12/2011 1359   KETONESUR NEGATIVE 10/28/2019 2301   PROTEINUR NEGATIVE 10/28/2019 2301   UROBILINOGEN 0.2  09/12/2011 1359   UROBILINOGEN 1.0 02/20/2009 0846   NITRITE NEGATIVE 10/28/2019 2301   LEUKOCYTESUR SMALL (A) 10/28/2019 2301    FURTHER DISCHARGE INSTRUCTIONS:   Get Medicines reviewed and adjusted: Please take all your medications with you for your next visit with your Primary MD   Laboratory/radiological data: Please request your Primary MD to go over all hospital tests and procedure/radiological results at the follow up, please ask your Primary MD to get all Hospital records sent to his/her office.   In some cases, they will be blood work, cultures and biopsy results pending at the time of your discharge. Please request that your primary care M.D. goes through all the records of your hospital data and follows up on these results.   Also Note the following: If you experience worsening of your admission symptoms, develop shortness of breath, life threatening emergency, suicidal or homicidal thoughts you must seek medical attention immediately by calling 911 or calling your MD immediately  if symptoms less severe.   You must read complete instructions/literature along with all the possible adverse reactions/side effects for all the Medicines you take and that have been prescribed to you. Take any new Medicines after you have completely understood and accpet all the possible adverse reactions/side effects.    Do not drive when taking Pain medications or sleeping medications (Benzodaizepines)   Do not take more than prescribed Pain, Sleep and Anxiety Medications. It is not advisable to combine anxiety,sleep and pain medications without talking with your primary care practitioner   Special Instructions: If you have smoked or chewed Tobacco  in the last 2 yrs please stop smoking, stop any regular Alcohol  and or any Recreational drug use.   Wear Seat belts while driving.   Please note: You were cared for by a hospitalist during your hospital stay. Once you are discharged, your primary  care physician will handle any further medical issues. Please note that NO REFILLS for any discharge medications will be authorized once you are discharged, as it is imperative that you return to your primary care physician (or establish a relationship with a primary care physician if you do not have one) for your post hospital discharge needs so that they can reassess your need for medications and monitor your lab values.  Time coordinating discharge: 35 minutes  SIGNED:  Pamella Pert, MD, PhD 11/01/2019, 8:58 AM

## 2019-11-01 NOTE — Progress Notes (Signed)
Orthopedic Tech Progress Note Patient Details:  ONDREA DOW Nov 06, 1945 [id number]  Ortho Devices Type of Ortho Device: Postop shoe/boot Ortho Device/Splint Location: Lower Right Extremity Ortho Device/Splint Interventions: Ordered, Application   Post Interventions Patient Tolerated: Well Instructions Provided: Adjustment of device, Poper ambulation with device, Care of device   Delman Goshorn P Harle [place] 11/01/2019, 11:22 AM

## 2019-11-01 NOTE — Plan of Care (Signed)

## 2019-11-01 NOTE — Progress Notes (Signed)
Geraline K Monk to be D/C'd  per MD order. Discussed with the patient and all questions fully answered.  VSS, Skin clean, dry and intact without evidence of skin break down, no evidence of skin tears noted.  IV catheter discontinued intact. Site without signs and symptoms of complications. Dressing and pressure applied.  An After Visit Summary was printed and given to the patient. Patient received prescription.  D/c education completed with patient/family including follow up instructions, medication list, d/c activities limitations if indicated, with other d/c instructions as indicated by MD - patient able to verbalize understanding, all questions fully answered.   Patient instructed to return to ED, call 911, or call MD for any changes in condition.   Patient to be escorted via WC, and D/C home via private auto.

## 2019-11-01 NOTE — Evaluation (Signed)
Physical Therapy Evaluation Patient Details Name: [name] MRN: [id number] DOB: 1945/12/29 Today's Date: 11/01/2019   History of Present Illness  Patient is a 74 year old female admitted with right foot pain. She was found to have an infection in her foot. She underwent and I and D on 10/31/2019 and will be treated further with antibiotics  Clinical Impression  Patient appears to be near baseline for mobility despite I and D. She had no pain for mobility and was mod I with mobility. She was advised to use her device in the house to take as much weight as needed off the foot. 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 She may require education on resources available to her while she is recovering. She is otherwise at her baseline mobility. She has no need for skilled therapy.     Follow Up Recommendations No PT follow up    Equipment Recommendations  None recommended by PT    Recommendations for Other Services       Precautions / Restrictions Precautions Precautions: None Restrictions Weight Bearing Restrictions: Yes RLE Weight Bearing: Weight bearing as tolerated      Mobility  Bed Mobility Overal bed mobility: Independent             General bed mobility comments: patient found in a cvhair. She reports she got out of bed without assistance   Transfers Overall transfer level: Modified independent Equipment used: Rolling walker (2 wheeled)             General transfer comment: stood without difficulty or pain in her foot   Ambulation/Gait Ambulation/Gait assistance: Modified independent (Device/Increase time) Gait Distance (Feet): 50 Feet Assistive device: Rolling walker (2 wheeled)       General Gait Details: ambualted in the room 50' without increased pain or fatigue. She did not have her surgical shoe so did not oput full weight on the foot.   Stairs            Wheelchair Mobility    Modified Rankin (Stroke  Patients Only)       Balance                                             Pertinent Vitals/Pain      Home Living Family/patient expects to be discharged to:: Private residence Living Arrangements: Spouse/significant other Available Help at Discharge: Family Type of Home: House       Home Layout: One level   Additional Comments: 2 steps to get in. Patient reports her husband is total care. She is the primary care giver     Prior Function Level of Independence: Independent with assistive device(s)         Comments: used a walker and a cane outside the house. She reports inside the house is pretty small and she uses furniture and the walls/      Hand Dominance   Dominant Hand: Right    Extremity/Trunk Assessment   Upper Extremity Assessment Upper Extremity Assessment: Overall WFL for tasks assessed    Lower Extremity Assessment Lower Extremity Assessment: Overall WFL for tasks assessed    Cervical / Trunk Assessment Cervical / Trunk Assessment: Normal  Communication   Communication: No difficulties  Cognition Arousal/Alertness: Awake/alert Behavior During Therapy: WFL for tasks assessed/performed Overall Cognitive Status: Within Functional Limits  for tasks assessed                                        General Comments      Exercises     Assessment/Plan    PT Assessment Patent does not need any further PT services  PT Problem List         PT Treatment Interventions      PT Goals (Current goals can be found in the Care Plan section)  Acute Rehab PT Goals Patient Stated Goal: to go home  PT Goal Formulation: With patient Time For Goal Achievement: 11/08/19 Potential to Achieve Goals: Good    Frequency     Barriers to discharge        Co-evaluation               AM-PAC PT "6 Clicks" Mobility  Outcome Measure Help needed turning from your back to your side while in a flat bed without using  bedrails?: None Help needed moving from lying on your back to sitting on the side of a flat bed without using bedrails?: None Help needed moving to and from a bed to a chair (including a wheelchair)?: None Help needed standing up from a chair using your arms (e.g., wheelchair or bedside chair)?: None Help needed to walk in hospital room?: None Help needed climbing 3-5 steps with a railing? : None 6 Click Score: 24    End of Session Equipment Utilized During Treatment: Gait belt Activity Tolerance: Patient tolerated treatment well Patient left: in chair;with call bell/phone within reach Nurse Communication: Mobility status PT Visit Diagnosis: Other abnormalities of gait and mobility (R26.89)    Time: 1010-1025 PT Time Calculation (min) (ACUTE ONLY): 15 min   Charges:   PT Evaluation $PT Eval Low Complexity: 1 Low          Carney Living PT DPT  11/01/2019, 11:13 AM

## 2019-11-03 ENCOUNTER — Other Ambulatory Visit: Payer: Self-pay

## 2019-11-03 LAB — CULTURE, BLOOD (ROUTINE X 2)
Culture: NO GROWTH
Culture: NO GROWTH
Special Requests: ADEQUATE
Special Requests: ADEQUATE

## 2019-11-03 NOTE — Patient Outreach (Signed)
[facility]) Care Management  11/03/2019  [name] 02-25-1946 [id number]     Transition of Care Referral  Referral Date: 10/30/2019 Referral Source: Inpatient Team Date of Discharge: 11/01/2019 Facility: [facility] Insurance: [facility]    Outreach attempt # 1 to patient. No answer after multiple rings and unable to leave message.    Plan: RN CM will make outreach attempt to patient within 3-4 business days. RN CM will send unsuccessful outreach letter to patient.   [name], RN,BSN,CCM [facility] Care Management Telephonic Care Management Coordinator Direct Phone: [phone] Toll Free: [phone] Fax: [phone]

## 2019-11-04 ENCOUNTER — Other Ambulatory Visit: Payer: Self-pay

## 2019-11-04 NOTE — Patient Outreach (Signed)
[facility]) Care Management  11/04/2019  [name] 04-May-1946 [id number]   Transition of Care Referral  Referral Date: 10/30/2019 Referral Source: Inpatient Team Date of Discharge: 11/01/2019 Facility: [facility] Insurance: [facility]   Outreach attempt # 2 to patient. No answer after multiple rings.      Plan: RN CM will make outreach attempt to patient within 3-4 business days.   [name], RN,BSN,CCM [facility] Care Management Telephonic Care Management Coordinator Direct Phone: [phone] Toll Free: [phone] Fax: [phone]

## 2019-11-05 ENCOUNTER — Other Ambulatory Visit: Payer: Self-pay

## 2019-11-05 ENCOUNTER — Ambulatory Visit (INDEPENDENT_AMBULATORY_CARE_PROVIDER_SITE_OTHER): Payer: Medicare HMO | Admitting: Podiatry

## 2019-11-05 DIAGNOSIS — L84 Corns and callosities: Secondary | ICD-10-CM

## 2019-11-05 DIAGNOSIS — E11628 Type 2 diabetes mellitus with other skin complications: Secondary | ICD-10-CM

## 2019-11-05 DIAGNOSIS — L089 Local infection of the skin and subcutaneous tissue, unspecified: Secondary | ICD-10-CM

## 2019-11-05 DIAGNOSIS — L02611 Cutaneous abscess of right foot: Secondary | ICD-10-CM

## 2019-11-05 NOTE — Patient Outreach (Signed)
[facility]) Care Management  11/05/2019  [name] 03-May-1946 [id number]   Transition of Care Referral  Referral Date:10/30/2019 Referral Source:Inpatient Team Date of Discharge:11/01/2019 Facility:[place] Insurance:[employer]   Outreach attempt #3 to patient. No answer at present.    Plan: RN CM will make outreach attempt to patient within 3 weeks if no response from letter mailed to patient.  [name], RN,BSN,CCM [facility] Care Management Telephonic Care Management Coordinator Direct Phone: [phone] Toll Free: [phone] Fax: [phone]

## 2019-11-07 ENCOUNTER — Other Ambulatory Visit: Payer: Self-pay | Admitting: Internal Medicine

## 2019-11-17 NOTE — Progress Notes (Signed)
Subjective: 74 year old female presents the office today by herself for hospital follow-up.  I saw her in the hospital for an infection in her right foot.  She had a bedside debridement, I&D performed significant purulence was identified.  She is on antibiotics currently she feels that the foot is healed.  Overall she feels that is feeling much better but she does get concerned the calluses can come back. Denies any systemic complaints such as fevers, chills, nausea, vomiting. No acute changes since last appointment, and no other complaints at this time.   Objective: AAO x3, NAD DP/PT pulses palpable bilaterally, CRT less than 3 seconds The wound on the plantar aspect the right foot in the area of previous ulceration has healed.  There was some mild callus formation which I debrided today and there is no underlying ulceration identified.  There is no fluctuation or crepitation peer there is no malodor.  There is no swelling or warmth of the foot.  Cellulitis appears to be resolved. No open lesions or pre-ulcerative lesions.  No pain with calf compression, swelling, warmth, erythema  Assessment: Resolved infection right foot  Plan: -All treatment options discussed with the patient including all alternatives, risks, complications.  -There is some minimal hyperkeratotic tissue today.  Appears that there is no wound or signs of infection today.  I will see her back in 3 weeks for check and also offer follow-up with [facility] for inserts to avoid pressure to help prevent the callus when coming back. -Patient encouraged to call the office with any questions, concerns, change in symptoms.   Vivi Barrack DPM

## 2019-11-24 ENCOUNTER — Other Ambulatory Visit: Payer: Self-pay

## 2019-11-24 NOTE — Patient Outreach (Signed)
[facility]) Care Management  11/24/2019  [name] 12-Feb-1946 [id number]   Transition of Care Referral  Referral Date:10/30/2019 Referral Source:Inpatient Team Date of Discharge:11/01/2019 Facility:[place] Insurance:[employer]   Multiple attempts to establish contact with patient without success. No response from letter mailed to patient. Case is being closed at this time.     Plan:  RN CM will close case at this time.   [name], RN,BSN,CCM [facility] Care Management Telephonic Care Management Coordinator Direct Phone: [phone] Toll Free: [phone] Fax: [phone]

## 2019-11-28 ENCOUNTER — Other Ambulatory Visit (HOSPITAL_COMMUNITY): Payer: Self-pay | Admitting: Adult Health

## 2019-12-04 ENCOUNTER — Ambulatory Visit: Payer: Medicare HMO | Admitting: Podiatry

## 2019-12-04 ENCOUNTER — Other Ambulatory Visit: Payer: Medicare HMO | Admitting: Orthotics

## 2019-12-14 ENCOUNTER — Encounter (HOSPITAL_COMMUNITY): Payer: Self-pay | Admitting: Emergency Medicine

## 2019-12-14 ENCOUNTER — Emergency Department (HOSPITAL_COMMUNITY): Payer: Medicare Other

## 2019-12-14 ENCOUNTER — Other Ambulatory Visit: Payer: Self-pay

## 2019-12-14 ENCOUNTER — Inpatient Hospital Stay (HOSPITAL_COMMUNITY)
Admission: EM | Admit: 2019-12-14 | Discharge: 2019-12-25 | DRG: 246 | Disposition: A | Discharge status: Hospice | Payer: Medicare Other | Attending: Internal Medicine | Admitting: Internal Medicine

## 2019-12-14 DIAGNOSIS — E875 Hyperkalemia: Secondary | ICD-10-CM | POA: Diagnosis not present

## 2019-12-14 DIAGNOSIS — I130 Hypertensive heart and chronic kidney disease with heart failure and stage 1 through stage 4 chronic kidney disease, or unspecified chronic kidney disease: Secondary | ICD-10-CM | POA: Diagnosis not present

## 2019-12-14 DIAGNOSIS — E1122 Type 2 diabetes mellitus with diabetic chronic kidney disease: Secondary | ICD-10-CM | POA: Diagnosis not present

## 2019-12-14 DIAGNOSIS — E785 Hyperlipidemia, unspecified: Secondary | ICD-10-CM | POA: Diagnosis present

## 2019-12-14 DIAGNOSIS — J440 Chronic obstructive pulmonary disease with acute lower respiratory infection: Secondary | ICD-10-CM | POA: Diagnosis not present

## 2019-12-14 DIAGNOSIS — Z79899 Other long term (current) drug therapy: Secondary | ICD-10-CM

## 2019-12-14 DIAGNOSIS — Z515 Encounter for palliative care: Secondary | ICD-10-CM | POA: Diagnosis not present

## 2019-12-14 DIAGNOSIS — D6959 Other secondary thrombocytopenia: Secondary | ICD-10-CM | POA: Diagnosis not present

## 2019-12-14 DIAGNOSIS — R0602 Shortness of breath: Secondary | ICD-10-CM

## 2019-12-14 DIAGNOSIS — Y92230 Patient room in hospital as the place of occurrence of the external cause: Secondary | ICD-10-CM | POA: Diagnosis not present

## 2019-12-14 DIAGNOSIS — I110 Hypertensive heart disease with heart failure: Secondary | ICD-10-CM | POA: Diagnosis not present

## 2019-12-14 DIAGNOSIS — E871 Hypo-osmolality and hyponatremia: Secondary | ICD-10-CM | POA: Diagnosis not present

## 2019-12-14 DIAGNOSIS — R079 Chest pain, unspecified: Secondary | ICD-10-CM | POA: Diagnosis not present

## 2019-12-14 DIAGNOSIS — I25110 Atherosclerotic heart disease of native coronary artery with unstable angina pectoris: Secondary | ICD-10-CM | POA: Diagnosis not present

## 2019-12-14 DIAGNOSIS — Z7189 Other specified counseling: Secondary | ICD-10-CM | POA: Diagnosis not present

## 2019-12-14 DIAGNOSIS — N390 Urinary tract infection, site not specified: Secondary | ICD-10-CM | POA: Diagnosis present

## 2019-12-14 DIAGNOSIS — I480 Paroxysmal atrial fibrillation: Secondary | ICD-10-CM | POA: Diagnosis present

## 2019-12-14 DIAGNOSIS — I209 Angina pectoris, unspecified: Secondary | ICD-10-CM | POA: Diagnosis present

## 2019-12-14 DIAGNOSIS — I509 Heart failure, unspecified: Secondary | ICD-10-CM

## 2019-12-14 DIAGNOSIS — J1282 Pneumonia due to coronavirus disease 2019: Secondary | ICD-10-CM | POA: Diagnosis present

## 2019-12-14 DIAGNOSIS — G9341 Metabolic encephalopathy: Secondary | ICD-10-CM | POA: Diagnosis present

## 2019-12-14 DIAGNOSIS — E1159 Type 2 diabetes mellitus with other circulatory complications: Secondary | ICD-10-CM

## 2019-12-14 DIAGNOSIS — R627 Adult failure to thrive: Secondary | ICD-10-CM | POA: Diagnosis present

## 2019-12-14 DIAGNOSIS — T82855A Stenosis of coronary artery stent, initial encounter: Secondary | ICD-10-CM | POA: Diagnosis present

## 2019-12-14 DIAGNOSIS — J849 Interstitial pulmonary disease, unspecified: Secondary | ICD-10-CM | POA: Diagnosis not present

## 2019-12-14 DIAGNOSIS — U071 COVID-19: Secondary | ICD-10-CM | POA: Diagnosis not present

## 2019-12-14 DIAGNOSIS — I447 Left bundle-branch block, unspecified: Secondary | ICD-10-CM | POA: Diagnosis present

## 2019-12-14 DIAGNOSIS — Z8614 Personal history of Methicillin resistant Staphylococcus aureus infection: Secondary | ICD-10-CM

## 2019-12-14 DIAGNOSIS — N1831 Chronic kidney disease, stage 3a: Secondary | ICD-10-CM | POA: Diagnosis present

## 2019-12-14 DIAGNOSIS — Z66 Do not resuscitate: Secondary | ICD-10-CM | POA: Diagnosis not present

## 2019-12-14 DIAGNOSIS — J9601 Acute respiratory failure with hypoxia: Secondary | ICD-10-CM | POA: Diagnosis not present

## 2019-12-14 DIAGNOSIS — Z888 Allergy status to other drugs, medicaments and biological substances status: Secondary | ICD-10-CM

## 2019-12-14 DIAGNOSIS — J9621 Acute and chronic respiratory failure with hypoxia: Secondary | ICD-10-CM | POA: Diagnosis not present

## 2019-12-14 DIAGNOSIS — I5043 Acute on chronic combined systolic (congestive) and diastolic (congestive) heart failure: Secondary | ICD-10-CM | POA: Diagnosis not present

## 2019-12-14 DIAGNOSIS — E039 Hypothyroidism, unspecified: Secondary | ICD-10-CM | POA: Diagnosis not present

## 2019-12-14 DIAGNOSIS — Z7902 Long term (current) use of antithrombotics/antiplatelets: Secondary | ICD-10-CM

## 2019-12-14 DIAGNOSIS — I491 Atrial premature depolarization: Secondary | ICD-10-CM | POA: Diagnosis not present

## 2019-12-14 DIAGNOSIS — I517 Cardiomegaly: Secondary | ICD-10-CM | POA: Diagnosis not present

## 2019-12-14 DIAGNOSIS — I255 Ischemic cardiomyopathy: Secondary | ICD-10-CM | POA: Diagnosis present

## 2019-12-14 DIAGNOSIS — E1165 Type 2 diabetes mellitus with hyperglycemia: Secondary | ICD-10-CM | POA: Diagnosis present

## 2019-12-14 DIAGNOSIS — I5022 Chronic systolic (congestive) heart failure: Secondary | ICD-10-CM | POA: Diagnosis not present

## 2019-12-14 DIAGNOSIS — R0789 Other chest pain: Secondary | ICD-10-CM | POA: Diagnosis not present

## 2019-12-14 DIAGNOSIS — Z8249 Family history of ischemic heart disease and other diseases of the circulatory system: Secondary | ICD-10-CM

## 2019-12-14 DIAGNOSIS — N179 Acute kidney failure, unspecified: Secondary | ICD-10-CM

## 2019-12-14 DIAGNOSIS — I252 Old myocardial infarction: Secondary | ICD-10-CM

## 2019-12-14 DIAGNOSIS — R54 Age-related physical debility: Secondary | ICD-10-CM | POA: Diagnosis not present

## 2019-12-14 DIAGNOSIS — Z794 Long term (current) use of insulin: Secondary | ICD-10-CM

## 2019-12-14 DIAGNOSIS — D72819 Decreased white blood cell count, unspecified: Secondary | ICD-10-CM | POA: Diagnosis not present

## 2019-12-14 DIAGNOSIS — I10 Essential (primary) hypertension: Secondary | ICD-10-CM | POA: Diagnosis present

## 2019-12-14 DIAGNOSIS — I25710 Atherosclerosis of autologous vein coronary artery bypass graft(s) with unstable angina pectoris: Secondary | ICD-10-CM | POA: Diagnosis present

## 2019-12-14 DIAGNOSIS — R509 Fever, unspecified: Secondary | ICD-10-CM

## 2019-12-14 DIAGNOSIS — J159 Unspecified bacterial pneumonia: Secondary | ICD-10-CM | POA: Diagnosis present

## 2019-12-14 DIAGNOSIS — J90 Pleural effusion, not elsewhere classified: Secondary | ICD-10-CM | POA: Diagnosis not present

## 2019-12-14 DIAGNOSIS — R069 Unspecified abnormalities of breathing: Secondary | ICD-10-CM

## 2019-12-14 DIAGNOSIS — Z95810 Presence of automatic (implantable) cardiac defibrillator: Secondary | ICD-10-CM

## 2019-12-14 DIAGNOSIS — Z833 Family history of diabetes mellitus: Secondary | ICD-10-CM

## 2019-12-14 DIAGNOSIS — I5023 Acute on chronic systolic (congestive) heart failure: Secondary | ICD-10-CM | POA: Diagnosis not present

## 2019-12-14 DIAGNOSIS — T380X5A Adverse effect of glucocorticoids and synthetic analogues, initial encounter: Secondary | ICD-10-CM | POA: Diagnosis present

## 2019-12-14 DIAGNOSIS — I5021 Acute systolic (congestive) heart failure: Secondary | ICD-10-CM | POA: Diagnosis not present

## 2019-12-14 DIAGNOSIS — R319 Hematuria, unspecified: Secondary | ICD-10-CM | POA: Diagnosis not present

## 2019-12-14 DIAGNOSIS — E119 Type 2 diabetes mellitus without complications: Secondary | ICD-10-CM

## 2019-12-14 DIAGNOSIS — T45515A Adverse effect of anticoagulants, initial encounter: Secondary | ICD-10-CM | POA: Diagnosis not present

## 2019-12-14 DIAGNOSIS — Z79890 Hormone replacement therapy (postmenopausal): Secondary | ICD-10-CM

## 2019-12-14 LAB — SARS CORONAVIRUS 2 BY RT PCR (HOSPITAL ORDER, PERFORMED IN ~~LOC~~ HOSPITAL LAB): SARS Coronavirus 2: POSITIVE — AB

## 2019-12-14 LAB — URINALYSIS, ROUTINE W REFLEX MICROSCOPIC
Bilirubin Urine: NEGATIVE
Glucose, UA: >=500 mg/dL — AB
Ketones, ur: NEGATIVE mg/dL
Nitrite: NEGATIVE
Protein, ur: 30 mg/dL — AB
Specific Gravity, Urine: 1.015 (ref 1.005–1.030)
pH: 5 (ref 5.0–8.0)

## 2019-12-14 LAB — BASIC METABOLIC PANEL
Anion gap: 10 (ref 5–15)
BUN: 15 mg/dL (ref 8–23)
CO2: 28 mmol/L (ref 22–32)
Calcium: 9.1 mg/dL (ref 8.9–10.3)
Chloride: 95 mmol/L — ABNORMAL LOW (ref 98–111)
Creatinine, Ser: 1.4 mg/dL — ABNORMAL HIGH (ref 0.44–1.00)
GFR calc Af Amer: 42 mL/min — ABNORMAL LOW (ref >60)
GFR calc non Af Amer: 37 mL/min — ABNORMAL LOW (ref >60)
Glucose, Bld: 436 mg/dL — ABNORMAL HIGH (ref 70–99)
Potassium: 5 mmol/L (ref 3.5–5.1)
Sodium: 133 mmol/L — ABNORMAL LOW (ref 135–145)

## 2019-12-14 LAB — CBC
HCT: 37.1 % (ref 36.0–46.0)
Hemoglobin: 11.2 g/dL — ABNORMAL LOW (ref 12.0–15.0)
MCH: 27.3 pg (ref 26.0–34.0)
MCHC: 30.2 g/dL (ref 30.0–36.0)
MCV: 90.5 fL (ref 80.0–100.0)
Platelets: 228 10*3/uL (ref 150–400)
RBC: 4.1 MIL/uL (ref 3.87–5.11)
RDW: 15.2 % (ref 11.5–15.5)
WBC: 4.9 10*3/uL (ref 4.0–10.5)
nRBC: 0 % (ref 0.0–0.2)

## 2019-12-14 LAB — TROPONIN I (HIGH SENSITIVITY)
Troponin I (High Sensitivity): 23 ng/L — ABNORMAL HIGH (ref <18)
Troponin I (High Sensitivity): 23 ng/L — ABNORMAL HIGH (ref <18)

## 2019-12-14 MED ORDER — LEVOTHYROXINE SODIUM 50 MCG PO TABS
50.0000 ug | ORAL_TABLET | Freq: Every day | ORAL | Status: DC
  Administered 2019-12-15 – 2019-12-23 (×8): 50 ug via ORAL
  Filled 2019-12-14 (×9): qty 1

## 2019-12-14 MED ORDER — VENLAFAXINE HCL ER 150 MG PO CP24
150.0000 mg | ORAL_CAPSULE | Freq: Every day | ORAL | Status: DC
  Administered 2019-12-15 – 2019-12-23 (×9): 150 mg via ORAL
  Filled 2019-12-14 (×13): qty 1

## 2019-12-14 MED ORDER — LOSARTAN POTASSIUM 25 MG PO TABS
25.0000 mg | ORAL_TABLET | Freq: Every day | ORAL | Status: DC
  Administered 2019-12-15 – 2019-12-17 (×3): 25 mg via ORAL
  Filled 2019-12-14 (×3): qty 1

## 2019-12-14 MED ORDER — SODIUM CHLORIDE 0.9% FLUSH
3.0000 mL | Freq: Once | INTRAVENOUS | Status: AC
  Administered 2019-12-15: 3 mL via INTRAVENOUS

## 2019-12-14 MED ORDER — EZETIMIBE 10 MG PO TABS
10.0000 mg | ORAL_TABLET | Freq: Every day | ORAL | Status: DC
  Administered 2019-12-15 – 2019-12-24 (×11): 10 mg via ORAL
  Filled 2019-12-14 (×11): qty 1

## 2019-12-14 MED ORDER — FUROSEMIDE 10 MG/ML IJ SOLN
40.0000 mg | Freq: Once | INTRAMUSCULAR | Status: AC
  Administered 2019-12-14: 40 mg via INTRAVENOUS
  Filled 2019-12-14: qty 4

## 2019-12-14 MED ORDER — SODIUM CHLORIDE 0.45 % IV SOLN: INTRAVENOUS | Status: DC

## 2019-12-14 MED ORDER — TRAMADOL HCL 50 MG PO TABS
50.0000 mg | ORAL_TABLET | Freq: Four times a day (QID) | ORAL | Status: DC | PRN
  Administered 2019-12-18 – 2019-12-24 (×3): 50 mg via ORAL
  Filled 2019-12-14 (×4): qty 1

## 2019-12-14 MED ORDER — INSULIN ASPART 100 UNIT/ML ~~LOC~~ SOLN
3.0000 [IU] | Freq: Three times a day (TID) | SUBCUTANEOUS | Status: DC
  Administered 2019-12-15 – 2019-12-16 (×5): 3 [IU] via SUBCUTANEOUS

## 2019-12-14 MED ORDER — OXYCODONE HCL 5 MG PO TABS: 5.0000 mg | ORAL_TABLET | Freq: Once | ORAL | Status: DC

## 2019-12-14 MED ORDER — INSULIN NPH (HUMAN) (ISOPHANE) 100 UNIT/ML ~~LOC~~ SUSP
12.0000 [IU] | Freq: Every day | SUBCUTANEOUS | Status: DC
  Administered 2019-12-15: 12 [IU] via SUBCUTANEOUS
  Filled 2019-12-14: qty 10

## 2019-12-14 MED ORDER — CARVEDILOL 3.125 MG PO TABS
6.2500 mg | ORAL_TABLET | Freq: Two times a day (BID) | ORAL | Status: DC
  Administered 2019-12-15: 6.25 mg via ORAL
  Filled 2019-12-14 (×3): qty 2

## 2019-12-14 MED ORDER — ONDANSETRON HCL 4 MG/2ML IJ SOLN: 4.0000 mg | Freq: Four times a day (QID) | INTRAMUSCULAR | Status: DC | PRN

## 2019-12-14 MED ORDER — FUROSEMIDE 10 MG/ML IJ SOLN
40.0000 mg | Freq: Two times a day (BID) | INTRAMUSCULAR | Status: AC
  Administered 2019-12-15 (×2): 40 mg via INTRAVENOUS
  Filled 2019-12-14 (×2): qty 4

## 2019-12-14 MED ORDER — AMIODARONE HCL 100 MG PO TABS
100.0000 mg | ORAL_TABLET | Freq: Every day | ORAL | Status: DC
  Administered 2019-12-15 – 2019-12-23 (×9): 100 mg via ORAL
  Filled 2019-12-14 (×11): qty 1

## 2019-12-14 MED ORDER — VITAMIN D 25 MCG (1000 UNIT) PO TABS
1000.0000 [IU] | ORAL_TABLET | Freq: Every day | ORAL | Status: DC
  Administered 2019-12-15 – 2019-12-23 (×9): 1000 [IU] via ORAL
  Filled 2019-12-14 (×11): qty 1

## 2019-12-14 MED ORDER — ACETAMINOPHEN 500 MG PO TABS: 1000.0000 mg | ORAL_TABLET | Freq: Four times a day (QID) | ORAL | Status: DC | PRN

## 2019-12-14 MED ORDER — APIXABAN 5 MG PO TABS
5.0000 mg | ORAL_TABLET | Freq: Two times a day (BID) | ORAL | Status: DC
  Administered 2019-12-15 – 2019-12-16 (×4): 5 mg via ORAL
  Filled 2019-12-14 (×5): qty 1

## 2019-12-14 MED ORDER — VITAMIN B-12 1000 MCG PO TABS
1000.0000 ug | ORAL_TABLET | Freq: Every day | ORAL | Status: DC
  Administered 2019-12-15 – 2019-12-23 (×9): 1000 ug via ORAL
  Filled 2019-12-14 (×10): qty 1

## 2019-12-14 MED ORDER — SIMVASTATIN 20 MG PO TABS
40.0000 mg | ORAL_TABLET | Freq: Every day | ORAL | Status: DC
  Administered 2019-12-15 – 2019-12-24 (×11): 40 mg via ORAL
  Filled 2019-12-14 (×11): qty 2

## 2019-12-14 MED ORDER — ENSURE ENLIVE PO LIQD
237.0000 mL | Freq: Every day | ORAL | Status: DC
  Administered 2019-12-15 – 2019-12-22 (×4): 237 mL via ORAL
  Filled 2019-12-14 (×2): qty 237

## 2019-12-14 NOTE — ED Triage Notes (Signed)
Pt to triage via [facility] from home.  Reports CP and SOB since Wednesday.  States pain is tightness in upper chest that radiates to throat.  Reports nausea.  Denies vomiting.  20g IV LFA.  EMS administered ASA 324mg .  CBG 514.  Pt states she is out of insulin and doesn't have transportation to get it.

## 2019-12-14 NOTE — ED Provider Notes (Signed)
[facility] Emergency Department Provider Note MRN:  [id number]  Arrival date & time: 12/14/19     Chief Complaint   Chest Pain   History of Present Illness   [name] is a 74 y.o. year-old female with a history of CAD, hypertension presenting to the ED with chief complaint of chest pain.  Chest pain on and off for the past 4 days, also with worsening shortness of breath and lower extremity edema.  Denies headache or vision change, no fever or cough, no abdominal pain.  Pain in the chest is at times moderate to severe.  Did not take any of her medicines today.  Review of Systems  A complete 10 system review of systems was obtained and all systems are negative except as noted in the HPI and PMH.   Patient's Health History    Past Medical History:  Diagnosis Date  . Cholelithiases   . Coronary artery disease 02/2007   CABG x4  . Diabetes (HCC)   . Dyslipidemia   . HTN (hypertension)   . Hypothyroid   . PAF (paroxysmal atrial fibrillation) (HCC)    post op    Past Surgical History:  Procedure Laterality Date  . ABDOMINAL HYSTERECTOMY    . CARDIAC CATHETERIZATION  03/01/2007   LAD with 99% prox lesion, at birfucation of Cfx there is 99% lesion, at bifurcation of large OM1 there is 99% lesion, 70% prox lesion at ramus intermedius, 90% lesion in prox RCA (Dr. Claudia Desanctis) - susequent CABG  . CORONARY ARTERY BYPASS GRAFT  03/05/2007   LIMA-LAD, SVG-ramus intermediate. SVG-OM, SVG-PDA (Dr. Donata Clay)  . CORONARY ATHERECTOMY N/A 05/17/2017   Procedure: CORONARY ATHERECTOMY;  Surgeon: Lennette Bihari, MD;  Location: [facility] INVASIVE CV LAB;  Service: Cardiovascular;  Laterality: N/A;  . CORONARY STENT INTERVENTION N/A 05/17/2017   Procedure: CORONARY STENT INTERVENTION;  Surgeon: Lennette Bihari, MD;  Location: [facility] INVASIVE CV LAB;  Service: Cardiovascular;  Laterality: N/A;  . ICD IMPLANT N/A 10/05/2017   Procedure: ICD IMPLANT;  Surgeon: Duke Salvia, MD;   Location: [facility] INVASIVE CV LAB;  Service: Cardiovascular;  Laterality: N/A;  . INCONTINENCE SURGERY  2009  . IR ANGIOGRAM SELECTIVE EACH ADDITIONAL VESSEL  01/31/2017  . IR ANGIOGRAM SELECTIVE EACH ADDITIONAL VESSEL  01/31/2017  . IR ANGIOGRAM VISCERAL SELECTIVE  01/31/2017  . IR EMBO ART  VEN HEMORR LYMPH EXTRAV  INC GUIDE ROADMAPPING  01/31/2017  . IR THORACENTESIS ASP PLEURAL SPACE W/IMG GUIDE  05/11/2017  . IR THORACENTESIS ASP PLEURAL SPACE W/IMG GUIDE  05/18/2017  . IR US GUIDE VASC ACCESS RIGHT  01/31/2017  . LEFT HEART CATH AND CORS/GRAFTS ANGIOGRAPHY N/A 05/15/2017   Procedure: LEFT HEART CATH AND CORS/GRAFTS ANGIOGRAPHY;  Surgeon: Lennette Bihari, MD;  Location[facility] INVASIVE CV LAB;  Service: Cardiovascular;  Laterality: N/A;  . LEFT HEART CATH AND CORS/GRAFTS ANGIOGRAPHY N/A 05/17/2017   Procedure: LEFT HEART CATH AND CORS/GRAFTS ANGIOGRAPHY;  Surgeon: Lennette Bihari, MD;  Location: [facility] INVASIVE CV LAB;  Service: Cardiovascular;  Laterality: N/A;  . NM MYOCAR PERF WALL MOTION  07/2011   bruce myoview - mild perfusion defect in basal inferolateral & mid inferolateral regions (attenuation artifact, but prior infarct annote be excluded)' EF 49%; low risk  . TRANSTHORACIC ECHOCARDIOGRAM  07/2011   EF 45-50%, mild septal hypokinesis, mild inferior wall hypokinesis; RV systolc function mildly reduced; borderline LA enlargement; mild MR & TR; AV mildly sclerotic; mild pulm valve regurg  Family History  Problem Relation Age of Onset  . Diabetes Mother 2  . Heart disease Mother   . Hyperlipidemia Mother   . Cancer Father   . Heart attack Father 43  . Cancer Maternal Grandmother   . Cancer Paternal Grandmother   . Thyroid disease Sister   . Diabetes Child     Social History   Socioeconomic History  . Marital status: Married    Spouse name: Not on file  . Number of children: 1  . Years of education: 85  . Highest education level: Not on file  Occupational History  . Occupation: retired     Associate Professor: [employer]  Tobacco Use  . Smoking status: Never Smoker  . Smokeless tobacco: Never Used  Vaping Use  . Vaping Use: Never used  Substance and Sexual Activity  . Alcohol use: No  . Drug use: No  . Sexual activity: Never    Birth control/protection: Abstinence  Other Topics Concern  . Not on file  Social History Narrative  . Not on file   Social Determinants of Health   Financial Resource Strain:   . Difficulty of Paying Living Expenses:   Food Insecurity:   . Worried About Programme researcher, broadcasting/film/video in the Last Year:   . Barista in the Last Year:   Transportation Needs:   . Freight forwarder (Medical):   Marland Kitchen Lack of Transportation (Non-Medical):   Physical Activity:   . Days of Exercise per Week:   . Minutes of Exercise per Session:   Stress:   . Feeling of Stress :   Social Connections:   . Frequency of Communication with Friends and Family:   . Frequency of Social Gatherings with Friends and Family:   . Attends Religious Services:   . Active Member of Clubs or Organizations:   . Attends Banker Meetings:   Marland Kitchen Marital Status:   Intimate Partner Violence:   . Fear of Current or Ex-Partner:   . Emotionally Abused:   Marland Kitchen Physically Abused:   . Sexually Abused:      Physical Exam   Vitals:   12/14/19 1941 12/14/19 1950  BP: (!) 133/92   Pulse: 85   Resp: 17   Temp:  98.7 F (37.1 C)  SpO2: 96%     CONSTITUTIONAL: Chronically ill-appearing, NAD NEURO:  Alert and oriented x 3, no focal deficits EYES:  eyes equal and reactive ENT/NECK:  no LAD, no JVD CARDIO: Regular rate, well-perfused, normal S1 and S2 PULM:  CTAB no wheezing or rhonchi GI/GU:  normal bowel sounds, non-distended, non-tender MSK/SPINE:  No gross deformities, no edema SKIN:  no rash, atraumatic PSYCH:  Appropriate speech and behavior  *Additional and/or pertinent findings included in MDM below  Diagnostic and Interventional Summary    EKG  Interpretation  Date/Time:  Sunday December 14 2019 14:06:09 EDT Ventricular Rate:  84 PR Interval:  188 QRS Duration: 124 QT Interval:  410 QTC Calculation: 484 R Axis:   137 Text Interpretation: Sinus rhythm with occasional Premature ventricular complexes Right axis deviation Anteroseptal infarct , age undetermined ST & T wave abnormality, consider inferior ischemia Abnormal ECG Confirmed by Kennis Carina [phone]) on 12/14/2019 6:15:03 PM      Labs Reviewed  BASIC METABOLIC PANEL - Abnormal; Notable for the following components:      Result Value   Sodium 133 (*)    Chloride 95 (*)    Glucose, Bld 436 (*)  Creatinine, Ser 1.40 (*)    GFR calc non Af Amer 37 (*)    GFR calc Af Amer 42 (*)    All other components within normal limits  CBC - Abnormal; Notable for the following components:   Hemoglobin 11.2 (*)    All other components within normal limits  URINALYSIS, ROUTINE W REFLEX MICROSCOPIC - Abnormal; Notable for the following components:   Color, Urine AMBER (*)    APPearance HAZY (*)    Glucose, UA >=500 (*)    Hgb urine dipstick LARGE (*)    Protein, ur 30 (*)    Leukocytes,Ua SMALL (*)    Bacteria, UA MANY (*)    All other components within normal limits  TROPONIN I (HIGH SENSITIVITY) - Abnormal; Notable for the following components:   Troponin I (High Sensitivity) 23 (*)    All other components within normal limits  BRAIN NATRIURETIC PEPTIDE  TROPONIN I (HIGH SENSITIVITY)    DG Chest 2 View  Final Result      Medications  sodium chloride flush (NS) 0.9 % injection 3 mL (has no administration in time range)  furosemide (LASIX) injection 40 mg (40 mg Intravenous Given 12/14/19 1947)     Procedures  /  Critical Care Procedures  ED Course and Medical Decision Making  I have reviewed the triage vital signs, the nursing notes, and pertinent available records from the EMR.  Listed above are laboratory and imaging tests that I personally ordered, reviewed, and  interpreted and then considered in my medical decision making (see below for details).      Complex cardiac history here with chest pain, first troponin minimally elevated, awaiting second troponin.  EKG with nonspecific changes.  Patient also seems to have mild pulmonary edema and lower extremity edema, likely a mild CHF exacerbation.  Will provide Lasix and reassess.  Discussed case with cardiology fellow, given patient's 4 days of chest pain and now with some nonspecific EKG changes, mild elevated troponin and evidence of mild CHF, there is concern that patient has had a cardiac event that is pushed over into heart failure.  Appropriate for hospitalist admission for trending of troponins and repeat echo and cardiology will follow in consultation.  Will admit to hospital service.  Elmer Sow. Pilar Plate, MD [facility] Emergency Medicine [facility] [email or web address]  Final Clinical Impressions(s) / ED Diagnoses     ICD-10-CM   1. Chest pain, unspecified type  R07.9   2. Acute on chronic congestive heart failure, unspecified heart failure type (HCC)  I50.9     ED Discharge Orders    None       Discharge Instructions Discussed with and Provided to Patient:   Discharge Instructions   None       Sabas Sous, MD 12/14/19 Corky Crafts

## 2019-12-14 NOTE — ED Notes (Signed)
DC O2, will ask lobby tech to check in 5 mins

## 2019-12-14 NOTE — ED Notes (Signed)
Walked to the  br with assistance feet and ankles swollen

## 2019-12-14 NOTE — H&P (Signed)
History and Physical    [name]:[id number] DOB: 05-14-1945 DOA: 12/14/2019  PCP: Georgianne Fick, MD (Confirm with patient/family/NH records and if not entered, this has to be entered at [facility] point of entry) Patient coming from: home  I have personally briefly reviewed patient's old medical records in [facility]  Chief Complaint: chest pain  HPI: [name] is a 74 y.o. female with medical history significant of CAD, ICM with EF 25-30% with pacemaker/defibrillator, PAF, DM who presents for evaluation of a 4 day h/o chest pain described as heavy pressure along with increased SOB, increased peripheral edema. She is followed by Dr. Rennis Golden [facility].   ED Course: Afebrile, 133/92 HR 81, RR 17, O2 sat 96% RA. Lab reveals positive U/A with small LE, many bacteria, 11-20 WBC. CXR read out as mild diffuse opacity centrally c/w pulmonary edema, question of small pleural effusion. Glu 435, Cr 1.40, CBC normal. Troponon #1 23, #2 23. EKG w/o acute STEMI. Covid POSITIVE. TRH called to admit for chest pain r/o observation. Of not the patient has had 1 dose Pfizer vaccine, missed 2nd dose and is w/o covid symptoms.  Review of Systems: As per HPI otherwise 10 point review of systems negative.    Past Medical History:  Diagnosis Date  . Cholelithiases   . Coronary artery disease 02/2007   CABG x4  . Diabetes (HCC)   . Dyslipidemia   . HTN (hypertension)   . Hypothyroid   . PAF (paroxysmal atrial fibrillation) (HCC)    post op    Past Surgical History:  Procedure Laterality Date  . ABDOMINAL HYSTERECTOMY    . CARDIAC CATHETERIZATION  03/01/2007   LAD with 99% prox lesion, at birfucation of Cfx there is 99% lesion, at bifurcation of large OM1 there is 99% lesion, 70% prox lesion at ramus intermedius, 90% lesion in prox RCA (Dr. Claudia Desanctis) - susequent CABG  . CORONARY ARTERY BYPASS GRAFT  03/05/2007   LIMA-LAD, SVG-ramus intermediate. SVG-OM, SVG-PDA (Dr. Donata Clay)  .  CORONARY ATHERECTOMY N/A 05/17/2017   Procedure: CORONARY ATHERECTOMY;  Surgeon: Lennette Bihari, MD;  Location: [facility] INVASIVE CV LAB;  Service: Cardiovascular;  Laterality: N/A;  . CORONARY STENT INTERVENTION N/A 05/17/2017   Procedure: CORONARY STENT INTERVENTION;  Surgeon: Lennette Bihari, MD;  Location: [facility] INVASIVE CV LAB;  Service: Cardiovascular;  Laterality: N/A;  . ICD IMPLANT N/A 10/05/2017   Procedure: ICD IMPLANT;  Surgeon: Duke Salvia, MD;  Location: [facility] INVASIVE CV LAB;  Service: Cardiovascular;  Laterality: N/A;  . INCONTINENCE SURGERY  2009  . IR ANGIOGRAM SELECTIVE EACH ADDITIONAL VESSEL  01/31/2017  . IR ANGIOGRAM SELECTIVE EACH ADDITIONAL VESSEL  01/31/2017  . IR ANGIOGRAM VISCERAL SELECTIVE  01/31/2017  . IR EMBO ART  VEN HEMORR LYMPH EXTRAV  INC GUIDE ROADMAPPING  01/31/2017  . IR THORACENTESIS ASP PLEURAL SPACE W/IMG GUIDE  05/11/2017  . IR THORACENTESIS ASP PLEURAL SPACE W/IMG GUIDE  05/18/2017  . IR US GUIDE VASC ACCESS RIGHT  01/31/2017  . LEFT HEART CATH AND CORS/GRAFTS ANGIOGRAPHY N/A 05/15/2017   Procedure: LEFT HEART CATH AND CORS/GRAFTS ANGIOGRAPHY;  Surgeon: Lennette Bihari, MD;  Location: [facility] INVASIVE CV LAB;  Service: Cardiovascular;  Laterality: N/A;  . LEFT HEART CATH AND CORS/GRAFTS ANGIOGRAPHY N/A 05/17/2017   Procedure: LEFT HEART CATH AND CORS/GRAFTS ANGIOGRAPHY;  Surgeon: Lennette Bihari, MD;  Location: [facility] INVASIVE CV LAB;  Service: Cardiovascular;  Laterality: N/A;  . NM MYOCAR PERF WALL MOTION  07/2011   bruce myoview - mild perfusion defect in basal inferolateral & mid inferolateral regions (attenuation artifact, but prior infarct annote be excluded)' EF 49%; low risk  . TRANSTHORACIC ECHOCARDIOGRAM  07/2011   EF 45-50%, mild septal hypokinesis, mild inferior wall hypokinesis; RV systolc function mildly reduced; borderline LA enlargement; mild MR & TR; AV mildly sclerotic; mild pulm valve regurg    Soc Hx -  Married 55 years, 1 daughter, 2 grandchildren. 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 Her grand-daugter lives with her and her husband who is disabled.   reports that she has never smoked. She has never used smokeless tobacco. She reports that she does not drink alcohol and does not use drugs.  Allergies  Allergen Reactions  . Crestor [Rosuvastatin] Other (See Comments)    Pain in hips and joints  . Lipitor [Atorvastatin] Other (See Comments)    Very bad pain in hips and joints    Family History  Problem Relation Age of Onset  . Diabetes Mother 54  . Heart disease Mother   . Hyperlipidemia Mother   . Cancer Father   . Heart attack Father 88  . Cancer Maternal Grandmother   . Cancer Paternal Grandmother   . Thyroid disease Sister   . Diabetes Child      Prior to Admission medications   Medication Sig Start Date End Date Taking? Authorizing Provider  acetaminophen (TYLENOL) 500 MG tablet Take 1,000 mg by mouth every 6 (six) hours as needed (pain).   Yes [provider]  amiodarone (PACERONE) 200 MG tablet Take 0.5 tablets (100 mg total) by mouth daily. 05/02/18  Yes Laurey Morale, MD  carvedilol (COREG) 6.25 MG tablet TAKE 1 TABLET (6.25 MG TOTAL) BY MOUTH 2 (TWO) TIMES DAILY WITH A MEAL (DOSE INCREASE) Patient taking differently: Take 6.25 mg by mouth 2 (two) times daily with a meal.  03/24/19  Yes Clegg, Amy D, NP  cholecalciferol (VITAMIN D) 1000 UNITS tablet Take 1,000 Units by mouth daily.    Yes [provider]  Cyanocobalamin (VITAMIN B-12 PO) Take 1 tablet by mouth daily.    Yes [provider]  ELIQUIS 5 MG TABS tablet TAKE 1 TABLET (5 MG TOTAL) BY MOUTH 2 (TWO) TIMES DAILY (STOP ASPIRIN, CONTINUE PLAVIX) Patient taking differently: Take 5 mg by mouth 2 (two) times daily.  12/02/19  Yes Laurey Morale, MD  ezetimibe (ZETIA) 10 MG tablet Take 1 tablet (10 mg total) by mouth daily. Patient taking differently: Take 10 mg by mouth at bedtime.  11/26/18 12/30/19 Yes Clegg, Amy D, NP  feeding  supplement, ENSURE ENLIVE, (ENSURE ENLIVE) LIQD Take 237 mLs by mouth daily. 02/10/18  Yes Hongalgi, Maximino Greenland, MD  ferrous sulfate 325 (65 FE) MG EC tablet Take 325 mg by mouth daily.   Yes [provider]  insulin NPH Human (NOVOLIN N) 100 UNIT/ML injection Inject 12 Units into the skin at bedtime.   Yes [provider]  insulin regular (NOVOLIN R) 100 units/mL injection Inject 3-4 Units into the skin See admin instructions. Inject 3-4 units subcutaneously up to three times daily with meals   Yes [provider]  levothyroxine (SYNTHROID) 50 MCG tablet TAKE 1 TABLET EVERY DAY Patient taking differently: Take 50 mcg by mouth daily before breakfast.  11/11/19  Yes Hilty, Lisette Abu, MD  losartan (COZAAR) 25 MG tablet TAKE 1 TABLET EVERY EVENING Patient taking differently: Take 25 mg by mouth daily.  03/26/19  Yes Bensimhon, Bevelyn Buckles, MD  simvastatin (ZOCOR) 40 MG tablet TAKE 1 TABLET AT BEDTIME Patient taking differently: Take 40 mg by mouth at bedtime.  09/02/19  Yes Laurey Morale, MD  torsemide (DEMADEX) 20 MG tablet Take 20 mg by mouth daily.    Yes [provider]  venlafaxine XR (EFFEXOR-XR) 150 MG 24 hr capsule Take 1 capsule (150 mg total) by mouth daily. Patient taking differently: Take 150 mg by mouth at bedtime.  11/26/18  Yes Clegg, Amy D, NP  vitamin C (ASCORBIC ACID) 500 MG tablet Take 500 mg by mouth daily.   Yes [provider]    Physical Exam: Vitals:   12/14/19 1427 12/14/19 1716 12/14/19 1941 12/14/19 1950  BP:  124/70 (!) 133/92   Pulse: 79 80 85   Resp:  16 17   Temp:    98.7 F (37.1 C)  TempSrc:    Oral  SpO2: 98% 97% 96%   Weight:      Height:        Constitutional: NAD, calm, comfortable Vitals:   12/14/19 1427 12/14/19 1716 12/14/19 1941 12/14/19 1950  BP:  124/70 (!) 133/92   Pulse: 79 80 85   Resp:  16 17   Temp:    98.7 F (37.1 C)  TempSrc:    Oral  SpO2: 98% 97% 96%   Weight:      Height:       General:  WNWD woman in no distress, seems comfortable Eyes: PERRL, lids and conjunctivae normal ENMT: Mucous membranes are moist. Posterior pharynx clear of any exudate or lesions.Normal dentition.  Neck: normal, supple, no masses, no thyromegaly Respiratory: clear to auscultation bilaterally, no wheezing, no crackles. Normal respiratory effort. No accessory muscle use.  Cardiovascular: Regular rate and rhythm w/ PVCs, no murmurs / rubs / gallops. 2+ LE extremity edema. 1+ pedal pulses. No carotid bruits.  Abdomen: no tenderness, no masses palpated. No hepatosplenomegaly. Bowel sounds positive.  Musculoskeletal: no clubbing / cyanosis. No joint deformity upper and lower extremities. Good ROM, no contractures. Normal muscle tone.  Skin: no rashes, lesions, ulcers. No induration Neurologic: CN 2-12 grossly intact. Strength 4/5 in all 4.  Psychiatric: Normal judgment and insight. Alert and oriented x 3. Normal mood.     Labs on Admission: I have personally reviewed following labs and imaging studies  CBC: Recent Labs  Lab 12/14/19 1355  WBC 4.9  HGB 11.2*  HCT 37.1  MCV 90.5  PLT 228   Basic Metabolic Panel: Recent Labs  Lab 12/14/19 1355  NA 133*  K 5.0  CL 95*  CO2 28  GLUCOSE 436*  BUN 15  CREATININE 1.40*  CALCIUM 9.1   GFR: Estimated Creatinine Clearance: 30.4 mL/min (A) (by C-G formula based on SCr of 1.4 mg/dL (H)). Liver Function Tests: No results for input(s): AST, ALT, ALKPHOS, BILITOT, PROT, ALBUMIN in the last 168 hours. No results for input(s): LIPASE, AMYLASE in the last 168 hours. No results for input(s): AMMONIA in the last 168 hours. Coagulation Profile: No results for input(s): INR, PROTIME in the last 168 hours. Cardiac Enzymes: No results for input(s): CKTOTAL, CKMB, CKMBINDEX, TROPONINI in the last 168 hours. BNP (last 3 results) No results for input(s): PROBNP in the last 8760 hours. HbA1C: No results for input(s): HGBA1C in the last 72 hours. CBG: No  results for input(s): GLUCAP in the last 168 hours. Lipid Profile: No results for input(s): CHOL, HDL, LDLCALC, TRIG, CHOLHDL, LDLDIRECT in the  last 72 hours. Thyroid Function Tests: No results for input(s): TSH, T4TOTAL, FREET4, T3FREE, THYROIDAB in the last 72 hours. Anemia Panel: No results for input(s): VITAMINB12, FOLATE, FERRITIN, TIBC, IRON, RETICCTPCT in the last 72 hours. Urine analysis:    Component Value Date/Time   COLORURINE AMBER (A) 12/14/2019 1910   APPEARANCEUR HAZY (A) 12/14/2019 1910   LABSPEC 1.015 12/14/2019 1910   PHURINE 5.0 12/14/2019 1910   GLUCOSEU >=500 (A) 12/14/2019 1910   HGBUR LARGE (A) 12/14/2019 1910   BILIRUBINUR NEGATIVE 12/14/2019 1910   BILIRUBINUR moderate 09/12/2011 1359   KETONESUR NEGATIVE 12/14/2019 1910   PROTEINUR 30 (A) 12/14/2019 1910   UROBILINOGEN 0.2 09/12/2011 1359   UROBILINOGEN 1.0 02/20/2009 0846   NITRITE NEGATIVE 12/14/2019 1910   LEUKOCYTESUR SMALL (A) 12/14/2019 1910    Radiological Exams on Admission: DG Chest 2 View  Result Date: 12/14/2019 CLINICAL DATA:  Chest pain EXAM: CHEST - 2 VIEW COMPARISON:  02/07/2018 FINDINGS: Cardiomegaly status post median sternotomy. Left chest multi lead pacer. Mild diffuse interstitial pulmonary opacity. Small bilateral pleural effusions. Disc degenerative disease of the thoracic spine. IMPRESSION: Cardiomegaly with mild diffuse interstitial pulmonary opacity and small bilateral pleural effusions, findings consistent with congestive heart failure. No focal airspace opacity. Electronically Signed   By: Lauralyn PrimesAlex  Bibbey M.D.   On: 12/14/2019 14:56    EKG: Independently reviewed. SR w/ PVCs, right axis deviation, ? Inferior ischemia but not acute STEMI changes  Assessment/Plan Active Problems:   Ischemic chest pain (HCC)   COVID-19 virus infection   Diabetes (HCC)   Essential hypertension   PAF (paroxysmal atrial fibrillation) (HCC)   Hypothyroid   Chronic systolic CHF (congestive heart  failure) (HCC)   Chest pain  (please populate well all problems here in Problem List. (For example, if patient is on BP meds at home and you resume or decide to hold them, it is a problem that needs to be her. Same for CAD, COPD, HLD and so on)   1. Chest pain - patient with significant cardiac h/o and risk factors with 4 day h/o substernal chest pressure. On presentation - mild CHF, EKG w/o acute changes, flat low level troponin x 2. Plan Observation adm for chest pain r/o MI  Continue home meds  Cardiology has been notified and will see patient in AM  2. CHF - patient with chronic systolic HF with last Echo 09/03/17 with LVEF 25-30%, Grade I diastolic dysfxn. Has elevated BNP in past. Now with CHF on CXR, but without clinical findings: no JVD, lungs clear on exam. Plan  Tele admit  BNP ordered  2D echo  Hold PO diuretic and give lasix 40 mg IV q 12 x 2 doses  3. DM - last A1C 10/29/19 9.3%. Admitting glucose 435 w/o AG Plan Continue NPH at HS  Continue low dose insulin with meals  Sliding scale  4. HTN- continue home meds except oral diuretic  5. PAF- currently in SR at controlled rate. ON elqiuis Plan Continue home regime  6. Covid + - patient did have 1st dose Pfizer vaccine. She is asymptomatic. Plan Covid precautions  Family notified to be tested  Pharmacy to investigate possibility of patient receiving monoclonal Ab infustion.  7. Code status - confirmed full code status with patient.  DVT prophylaxis: eliquis  Code Status: full code  Family Communication: spoke with daughter - answered all questions  Disposition Plan: home when medically stable  Consults called: [facility] consult called  Admission status: observation - tele  Illene Regulus MD [facility] Pager [phone]  If 7PM-7AM, please contact night-coverage [email or web address] Password [facility]  12/14/2019, 11:23 PM

## 2019-12-15 ENCOUNTER — Observation Stay (HOSPITAL_COMMUNITY): Payer: Medicare Other

## 2019-12-15 ENCOUNTER — Ambulatory Visit (HOSPITAL_COMMUNITY): Payer: Medicare Other

## 2019-12-15 DIAGNOSIS — I5020 Unspecified systolic (congestive) heart failure: Secondary | ICD-10-CM | POA: Diagnosis not present

## 2019-12-15 DIAGNOSIS — D72819 Decreased white blood cell count, unspecified: Secondary | ICD-10-CM | POA: Diagnosis not present

## 2019-12-15 DIAGNOSIS — E039 Hypothyroidism, unspecified: Secondary | ICD-10-CM | POA: Diagnosis not present

## 2019-12-15 DIAGNOSIS — E871 Hypo-osmolality and hyponatremia: Secondary | ICD-10-CM | POA: Diagnosis not present

## 2019-12-15 DIAGNOSIS — R0602 Shortness of breath: Secondary | ICD-10-CM | POA: Diagnosis not present

## 2019-12-15 DIAGNOSIS — J9601 Acute respiratory failure with hypoxia: Secondary | ICD-10-CM | POA: Diagnosis not present

## 2019-12-15 DIAGNOSIS — I509 Heart failure, unspecified: Secondary | ICD-10-CM | POA: Diagnosis not present

## 2019-12-15 DIAGNOSIS — M2550 Pain in unspecified joint: Secondary | ICD-10-CM | POA: Diagnosis not present

## 2019-12-15 DIAGNOSIS — U071 COVID-19: Secondary | ICD-10-CM

## 2019-12-15 DIAGNOSIS — I2510 Atherosclerotic heart disease of native coronary artery without angina pectoris: Secondary | ICD-10-CM | POA: Diagnosis not present

## 2019-12-15 DIAGNOSIS — R627 Adult failure to thrive: Secondary | ICD-10-CM | POA: Diagnosis present

## 2019-12-15 DIAGNOSIS — J1282 Pneumonia due to coronavirus disease 2019: Secondary | ICD-10-CM | POA: Diagnosis present

## 2019-12-15 DIAGNOSIS — I700 Atherosclerosis of aorta: Secondary | ICD-10-CM | POA: Diagnosis not present

## 2019-12-15 DIAGNOSIS — Z7189 Other specified counseling: Secondary | ICD-10-CM | POA: Diagnosis not present

## 2019-12-15 DIAGNOSIS — J189 Pneumonia, unspecified organism: Secondary | ICD-10-CM | POA: Diagnosis not present

## 2019-12-15 DIAGNOSIS — I208 Other forms of angina pectoris: Secondary | ICD-10-CM | POA: Diagnosis not present

## 2019-12-15 DIAGNOSIS — R570 Cardiogenic shock: Secondary | ICD-10-CM | POA: Diagnosis not present

## 2019-12-15 DIAGNOSIS — I480 Paroxysmal atrial fibrillation: Secondary | ICD-10-CM | POA: Diagnosis not present

## 2019-12-15 DIAGNOSIS — Z7401 Bed confinement status: Secondary | ICD-10-CM | POA: Diagnosis not present

## 2019-12-15 DIAGNOSIS — G9341 Metabolic encephalopathy: Secondary | ICD-10-CM | POA: Diagnosis present

## 2019-12-15 DIAGNOSIS — E1129 Type 2 diabetes mellitus with other diabetic kidney complication: Secondary | ICD-10-CM | POA: Diagnosis not present

## 2019-12-15 DIAGNOSIS — J9621 Acute and chronic respiratory failure with hypoxia: Secondary | ICD-10-CM | POA: Diagnosis present

## 2019-12-15 DIAGNOSIS — I5021 Acute systolic (congestive) heart failure: Secondary | ICD-10-CM | POA: Diagnosis not present

## 2019-12-15 DIAGNOSIS — D6959 Other secondary thrombocytopenia: Secondary | ICD-10-CM | POA: Diagnosis not present

## 2019-12-15 DIAGNOSIS — Z743 Need for continuous supervision: Secondary | ICD-10-CM | POA: Diagnosis not present

## 2019-12-15 DIAGNOSIS — I5022 Chronic systolic (congestive) heart failure: Secondary | ICD-10-CM | POA: Diagnosis not present

## 2019-12-15 DIAGNOSIS — N390 Urinary tract infection, site not specified: Secondary | ICD-10-CM | POA: Diagnosis present

## 2019-12-15 DIAGNOSIS — J90 Pleural effusion, not elsewhere classified: Secondary | ICD-10-CM | POA: Diagnosis not present

## 2019-12-15 DIAGNOSIS — K8020 Calculus of gallbladder without cholecystitis without obstruction: Secondary | ICD-10-CM | POA: Diagnosis not present

## 2019-12-15 DIAGNOSIS — Z515 Encounter for palliative care: Secondary | ICD-10-CM | POA: Diagnosis not present

## 2019-12-15 DIAGNOSIS — E1165 Type 2 diabetes mellitus with hyperglycemia: Secondary | ICD-10-CM | POA: Diagnosis present

## 2019-12-15 DIAGNOSIS — N1831 Chronic kidney disease, stage 3a: Secondary | ICD-10-CM | POA: Diagnosis present

## 2019-12-15 DIAGNOSIS — Z66 Do not resuscitate: Secondary | ICD-10-CM | POA: Diagnosis not present

## 2019-12-15 DIAGNOSIS — I5023 Acute on chronic systolic (congestive) heart failure: Secondary | ICD-10-CM | POA: Diagnosis not present

## 2019-12-15 DIAGNOSIS — Z4901 Encounter for fitting and adjustment of extracorporeal dialysis catheter: Secondary | ICD-10-CM | POA: Diagnosis not present

## 2019-12-15 DIAGNOSIS — I517 Cardiomegaly: Secondary | ICD-10-CM | POA: Diagnosis not present

## 2019-12-15 DIAGNOSIS — Z9861 Coronary angioplasty status: Secondary | ICD-10-CM | POA: Diagnosis not present

## 2019-12-15 DIAGNOSIS — J849 Interstitial pulmonary disease, unspecified: Secondary | ICD-10-CM | POA: Diagnosis not present

## 2019-12-15 DIAGNOSIS — N179 Acute kidney failure, unspecified: Secondary | ICD-10-CM | POA: Diagnosis not present

## 2019-12-15 DIAGNOSIS — J159 Unspecified bacterial pneumonia: Secondary | ICD-10-CM | POA: Diagnosis present

## 2019-12-15 DIAGNOSIS — N3289 Other specified disorders of bladder: Secondary | ICD-10-CM | POA: Diagnosis not present

## 2019-12-15 DIAGNOSIS — I5043 Acute on chronic combined systolic (congestive) and diastolic (congestive) heart failure: Secondary | ICD-10-CM | POA: Diagnosis not present

## 2019-12-15 DIAGNOSIS — I10 Essential (primary) hypertension: Secondary | ICD-10-CM | POA: Diagnosis not present

## 2019-12-15 DIAGNOSIS — D7389 Other diseases of spleen: Secondary | ICD-10-CM | POA: Diagnosis not present

## 2019-12-15 DIAGNOSIS — I25110 Atherosclerotic heart disease of native coronary artery with unstable angina pectoris: Secondary | ICD-10-CM | POA: Diagnosis not present

## 2019-12-15 DIAGNOSIS — R079 Chest pain, unspecified: Secondary | ICD-10-CM | POA: Diagnosis not present

## 2019-12-15 DIAGNOSIS — T82855A Stenosis of coronary artery stent, initial encounter: Secondary | ICD-10-CM | POA: Diagnosis present

## 2019-12-15 DIAGNOSIS — R5381 Other malaise: Secondary | ICD-10-CM | POA: Diagnosis not present

## 2019-12-15 DIAGNOSIS — J440 Chronic obstructive pulmonary disease with acute lower respiratory infection: Secondary | ICD-10-CM | POA: Diagnosis present

## 2019-12-15 DIAGNOSIS — E1122 Type 2 diabetes mellitus with diabetic chronic kidney disease: Secondary | ICD-10-CM | POA: Diagnosis present

## 2019-12-15 DIAGNOSIS — R54 Age-related physical debility: Secondary | ICD-10-CM | POA: Diagnosis present

## 2019-12-15 DIAGNOSIS — I130 Hypertensive heart and chronic kidney disease with heart failure and stage 1 through stage 4 chronic kidney disease, or unspecified chronic kidney disease: Secondary | ICD-10-CM | POA: Diagnosis not present

## 2019-12-15 DIAGNOSIS — Y92230 Patient room in hospital as the place of occurrence of the external cause: Secondary | ICD-10-CM | POA: Diagnosis not present

## 2019-12-15 LAB — CBG MONITORING, ED
Glucose-Capillary: 118 mg/dL — ABNORMAL HIGH (ref 70–99)
Glucose-Capillary: 137 mg/dL — ABNORMAL HIGH (ref 70–99)
Glucose-Capillary: 265 mg/dL — ABNORMAL HIGH (ref 70–99)
Glucose-Capillary: 293 mg/dL — ABNORMAL HIGH (ref 70–99)
Glucose-Capillary: 374 mg/dL — ABNORMAL HIGH (ref 70–99)
Glucose-Capillary: 60 mg/dL — ABNORMAL LOW (ref 70–99)
Glucose-Capillary: 69 mg/dL — ABNORMAL LOW (ref 70–99)
Glucose-Capillary: 81 mg/dL (ref 70–99)

## 2019-12-15 LAB — I-STAT ARTERIAL BLOOD GAS, ED
Acid-Base Excess: 5 mmol/L — ABNORMAL HIGH (ref 0.0–2.0)
Bicarbonate: 27.7 mmol/L (ref 20.0–28.0)
Calcium, Ion: 1.09 mmol/L — ABNORMAL LOW (ref 1.15–1.40)
HCT: 38 % (ref 36.0–46.0)
Hemoglobin: 12.9 g/dL (ref 12.0–15.0)
O2 Saturation: 99 %
Patient temperature: 99.2
Potassium: 3.7 mmol/L (ref 3.5–5.1)
Sodium: 135 mmol/L (ref 135–145)
TCO2: 29 mmol/L (ref 22–32)
pCO2 arterial: 34.5 mmHg (ref 32.0–48.0)
pH, Arterial: 7.514 — ABNORMAL HIGH (ref 7.350–7.450)
pO2, Arterial: 105 mmHg (ref 83.0–108.0)

## 2019-12-15 LAB — ECHOCARDIOGRAM COMPLETE
Area-P 1/2: 4.31 cm2
Calc EF: 19 %
Height: 62 in
MV M vel: 4.78 m/s
MV Peak grad: 91.4 mmHg
Radius: 0.4 cm
S' Lateral: 5.7 cm
Single Plane A2C EF: 28 %
Single Plane A4C EF: 9.7 %
Weight: 2240 oz

## 2019-12-15 LAB — BASIC METABOLIC PANEL
Anion gap: 11 (ref 5–15)
BUN: 15 mg/dL (ref 8–23)
CO2: 29 mmol/L (ref 22–32)
Calcium: 8.7 mg/dL — ABNORMAL LOW (ref 8.9–10.3)
Chloride: 95 mmol/L — ABNORMAL LOW (ref 98–111)
Creatinine, Ser: 1.31 mg/dL — ABNORMAL HIGH (ref 0.44–1.00)
GFR calc Af Amer: 46 mL/min — ABNORMAL LOW (ref >60)
GFR calc non Af Amer: 40 mL/min — ABNORMAL LOW (ref >60)
Glucose, Bld: 366 mg/dL — ABNORMAL HIGH (ref 70–99)
Potassium: 4 mmol/L (ref 3.5–5.1)
Sodium: 135 mmol/L (ref 135–145)

## 2019-12-15 LAB — CBC WITH DIFFERENTIAL/PLATELET
Abs Immature Granulocytes: 0.01 10*3/uL (ref 0.00–0.07)
Basophils Absolute: 0 10*3/uL (ref 0.0–0.1)
Basophils Relative: 1 %
Eosinophils Absolute: 0 10*3/uL (ref 0.0–0.5)
Eosinophils Relative: 0 %
HCT: 38.6 % (ref 36.0–46.0)
Hemoglobin: 11.8 g/dL — ABNORMAL LOW (ref 12.0–15.0)
Immature Granulocytes: 0 %
Lymphocytes Relative: 15 %
Lymphs Abs: 0.8 10*3/uL (ref 0.7–4.0)
MCH: 27.3 pg (ref 26.0–34.0)
MCHC: 30.6 g/dL (ref 30.0–36.0)
MCV: 89.4 fL (ref 80.0–100.0)
Monocytes Absolute: 0.7 10*3/uL (ref 0.1–1.0)
Monocytes Relative: 13 %
Neutro Abs: 3.6 10*3/uL (ref 1.7–7.7)
Neutrophils Relative %: 71 %
Platelets: 225 10*3/uL (ref 150–400)
RBC: 4.32 MIL/uL (ref 3.87–5.11)
RDW: 15.3 % (ref 11.5–15.5)
WBC: 5.1 10*3/uL (ref 4.0–10.5)
nRBC: 0 % (ref 0.0–0.2)

## 2019-12-15 LAB — BRAIN NATRIURETIC PEPTIDE
B Natriuretic Peptide: 4206.6 pg/mL — ABNORMAL HIGH (ref 0.0–100.0)
B Natriuretic Peptide: 3317.2 pg/mL — ABNORMAL HIGH (ref 0.0–100.0)

## 2019-12-15 LAB — HEPATIC FUNCTION PANEL
ALT: 18 U/L (ref 0–44)
AST: 28 U/L (ref 15–41)
Albumin: 3.2 g/dL — ABNORMAL LOW (ref 3.5–5.0)
Alkaline Phosphatase: 65 U/L (ref 38–126)
Bilirubin, Direct: 0.2 mg/dL (ref 0.0–0.2)
Indirect Bilirubin: 0.3 mg/dL (ref 0.3–0.9)
Total Bilirubin: 0.5 mg/dL (ref 0.3–1.2)
Total Protein: 6.5 g/dL (ref 6.5–8.1)

## 2019-12-15 LAB — C-REACTIVE PROTEIN: CRP: 1.6 mg/dL — ABNORMAL HIGH (ref <1.0)

## 2019-12-15 LAB — PROCALCITONIN: Procalcitonin: <0.1 ng/mL

## 2019-12-15 LAB — TROPONIN I (HIGH SENSITIVITY): Troponin I (High Sensitivity): 36 ng/L — ABNORMAL HIGH (ref <18)

## 2019-12-15 LAB — FERRITIN: Ferritin: 72 ng/mL (ref 11–307)

## 2019-12-15 LAB — D-DIMER, QUANTITATIVE: D-Dimer, Quant: 1.24 ug/mL-FEU — ABNORMAL HIGH (ref 0.00–0.50)

## 2019-12-15 MED ORDER — ALBUTEROL SULFATE (2.5 MG/3ML) 0.083% IN NEBU: 2.5000 mg | INHALATION_SOLUTION | Freq: Once | RESPIRATORY_TRACT | Status: DC

## 2019-12-15 MED ORDER — INSULIN ASPART 100 UNIT/ML ~~LOC~~ SOLN
0.0000 [IU] | Freq: Three times a day (TID) | SUBCUTANEOUS | Status: DC
  Administered 2019-12-15: 8 [IU] via SUBCUTANEOUS
  Administered 2019-12-16: 2 [IU] via SUBCUTANEOUS
  Administered 2019-12-18: 3 [IU] via SUBCUTANEOUS
  Administered 2019-12-18: 2 [IU] via SUBCUTANEOUS
  Administered 2019-12-19 (×2): 5 [IU] via SUBCUTANEOUS
  Administered 2019-12-19: 3 [IU] via SUBCUTANEOUS
  Administered 2019-12-20 (×2): 5 [IU] via SUBCUTANEOUS
  Administered 2019-12-20: 3 [IU] via SUBCUTANEOUS
  Administered 2019-12-21: 11 [IU] via SUBCUTANEOUS
  Administered 2019-12-21 – 2019-12-22 (×4): 8 [IU] via SUBCUTANEOUS
  Administered 2019-12-22: 2 [IU] via SUBCUTANEOUS
  Administered 2019-12-23: 3 [IU] via SUBCUTANEOUS
  Administered 2019-12-25: 2 [IU] via SUBCUTANEOUS

## 2019-12-15 MED ORDER — FUROSEMIDE 10 MG/ML IJ SOLN
40.0000 mg | Freq: Two times a day (BID) | INTRAMUSCULAR | Status: DC
  Administered 2019-12-15 – 2019-12-16 (×2): 40 mg via INTRAVENOUS
  Filled 2019-12-15 (×2): qty 4

## 2019-12-15 MED ORDER — SODIUM CHLORIDE 0.9 % IV SOLN
1.0000 g | INTRAVENOUS | Status: DC
  Administered 2019-12-17: 1 g via INTRAVENOUS
  Filled 2019-12-15: qty 10

## 2019-12-15 MED ORDER — FUROSEMIDE 10 MG/ML IJ SOLN: 40.0000 mg | Freq: Two times a day (BID) | INTRAMUSCULAR | Status: DC

## 2019-12-15 MED ORDER — INSULIN GLARGINE 100 UNIT/ML ~~LOC~~ SOLN
15.0000 [IU] | Freq: Every day | SUBCUTANEOUS | Status: DC
  Administered 2019-12-15 – 2019-12-16 (×2): 15 [IU] via SUBCUTANEOUS
  Filled 2019-12-15 (×2): qty 0.15

## 2019-12-15 MED ORDER — IPRATROPIUM-ALBUTEROL 0.5-2.5 (3) MG/3ML IN SOLN: 3.0000 mL | Freq: Once | RESPIRATORY_TRACT | Status: DC

## 2019-12-15 MED ORDER — ALBUTEROL SULFATE HFA 108 (90 BASE) MCG/ACT IN AERS
4.0000 | INHALATION_SPRAY | Freq: Once | RESPIRATORY_TRACT | Status: AC
  Administered 2019-12-15: 4 via RESPIRATORY_TRACT
  Filled 2019-12-15: qty 6.7

## 2019-12-15 MED ORDER — CIPROFLOXACIN HCL 500 MG PO TABS
250.0000 mg | ORAL_TABLET | Freq: Two times a day (BID) | ORAL | Status: DC
  Administered 2019-12-15 (×2): 250 mg via ORAL
  Filled 2019-12-15 (×2): qty 1

## 2019-12-15 MED ORDER — SPIRONOLACTONE 25 MG PO TABS
25.0000 mg | ORAL_TABLET | Freq: Every day | ORAL | Status: DC
  Administered 2019-12-15 – 2019-12-17 (×3): 25 mg via ORAL
  Filled 2019-12-15 (×4): qty 1

## 2019-12-15 MED ORDER — PERFLUTREN LIPID MICROSPHERE
1.0000 mL | INTRAVENOUS | Status: AC | PRN
  Administered 2019-12-15: 2 mL via INTRAVENOUS
  Filled 2019-12-15: qty 10

## 2019-12-15 MED ORDER — IPRATROPIUM-ALBUTEROL 0.5-2.5 (3) MG/3ML IN SOLN: 3.0000 mL | Freq: Four times a day (QID) | RESPIRATORY_TRACT | Status: DC

## 2019-12-15 MED ORDER — PANTOPRAZOLE SODIUM 40 MG PO TBEC
40.0000 mg | DELAYED_RELEASE_TABLET | Freq: Every day | ORAL | Status: DC
  Administered 2019-12-15 – 2019-12-23 (×9): 40 mg via ORAL
  Filled 2019-12-15 (×11): qty 1

## 2019-12-15 MED ORDER — NITROGLYCERIN 0.4 MG SL SUBL: 0.4000 mg | SUBLINGUAL_TABLET | SUBLINGUAL | Status: DC | PRN

## 2019-12-15 NOTE — ED Notes (Signed)
Lunch Tray Ordered @ 1033. 

## 2019-12-15 NOTE — Progress Notes (Signed)
PROGRESS NOTE                                                                                                                                                                                                             Patient Demographics:    [name], is a 74 y.o. female, DOB - 12-18-1945, UQJ:[id number]  Outpatient Primary MD for the patient is Georgianne Fick, MD   Admit date - 12/14/2019   LOS - 0  Chief Complaint  Patient presents with  . Chest Pain       Brief Narrative: Patient is a 74 y.o. female with PMHx of chronic systolic heart failure-s/p AICD in place, PAF on Eliquis, DM-2 who is partially vaccinated against Covid ( received 1 dose of Moderna vaccine sometime in June) presented with 4-5-day history of worsening shortness of breath, leg swelling, chest pressure radiating to the neck.  She was thought to have decompensated systolic heart failure-and subsequently admitted to the hospitalist service.    She also tested positive for Covid-but thought to be mostly asymptomatic as her presenting symptoms are most consistent with decompensated heart failure.  Significant Events: 8/8>> Admit to [facility] for chest pressure/decompensated heart failure  Significant studies: 8/8>>Chest x-ray: Cardiomegaly with mild diffuse interstitial pulmonary opacities/bilateral pleural effusions. 8/9>> chest x-ray: CHF with small bilateral pleural effusions.  COVID-19 medications: None  Antibiotics: None  Microbiology data: None  Procedures: None  Consults: Cardiology  DVT prophylaxis: apixaban (ELIQUIS) tablet 5 mg     Subjective:    [name] today continues to have significant lower extremity edema-she denies any cough-she was on 3 L of oxygen when I saw her-I removed her O2-however gradually her O2 saturations decreased down to the low 80s.  She was placed back on 3 L of oxygen with stable O2 saturations.    Assessment  & Plan :   Acute hypoxic respiratory failure secondary to decompensated systolic heart failure: Grossly volume overloaded-even though she tested positive for COVID-19-her clinical features are more consistent with decompensated heart failure.  Her inflammatory markers are not significantly elevated to suggest she has active Covid 19 pneumonitis.  Continue IV Lasix-add Aldactone-await updated 2D echo and cardiology input.  Remains on beta-blocker and losartan.  Follow weights/electrolytes/intake and output.  Chest pain: Has mild intermittent chest pain which she describes as pressure going into  her neck.  Have added sublingual nitroglycerin to see if this relieves her pain.  Has history of CAD-is s/p CABG in 2013 and PCI in 2019.  Troponins are minimally elevated and flat-however EKG is almost consistent with a LBBB.  Await cardiology input.  DM-2 (A1c 9.3 on 10/29/2019): She ran out of her insulin a few days back-starting Lantus 15 units daily, 3 units of NovoLog with meals-and moderate SSI-follow and optimize.  Recent Labs    12/15/19 0023 12/15/19 0741 12/15/19 1104  GLUCAP 374* 293* 265*   PAF: Continue telemetry monitoring-on amiodarone/Coreg for rate control-anticoagulated with Eliquis  HTN: Controlled-continue losartan and Coreg  UTI: Complains of dysuria-UA somewhat suggestive of UTI-cultures not sent-stop Cipro-start Rocephin x2 more days to complete a 3-day course.  Hypothyroidism: Continue with Synthroid  COVID-19 infection: As noted above-current clinical situation is more consistent with decompensated heart failure-inflammatory markers are not significantly elevated-plan is to observe-diurese-if hypoxia does not improve with diuresis-or worsen significantly-we can then contemplate treating with steroids/remdesivir.   COVID-19 Labs: Recent Labs    12/15/19 0522  DDIMER 1.24*  FERRITIN 72  CRP 1.6*  32123     Component Value Date/Time   BNP 3,317.2 (H)  12/14/2019 2302    No results for input(s): PROCALCITON in the last 168 hours.  Lab Results  Component Value Date   SARSCOV2NAA POSITIVE (A) 12/14/2019   SARSCOV2NAA NEGATIVE 10/29/2019     ABG:    Component Value Date/Time   PHART 7.514 (H) 12/15/2019 0416   PCO2ART 34.5 12/15/2019 0416   PO2ART 105 12/15/2019 0416   HCO3 27.7 12/15/2019 0416   TCO2 29 12/15/2019 0416   ACIDBASEDEF 2.0 02/13/2017 2359   O2SAT 99.0 12/15/2019 0416    Vent Settings: N/A   Condition - Stable  Family Communication  :  Daughter updated over the phone 8/9  Code Status :  Full Code  Diet :  Diet Order            Diet heart healthy/carb modified Room service appropriate? Yes; Fluid consistency: Thin  Diet effective now                  Disposition Plan  :   Status is: Observation  The patient will require care spanning > 2 midnights and should be moved to inpatient because: Hypoxemia requiring IV Lasix and oxygen supplementation  Dispo: The patient is from: Home              Anticipated d/c is to: Home              Anticipated d/c date is: 2 days              Patient currently is not medically stable to d/c.   Barriers to discharge: Hypoxia requiring O2 supplementation/IV Lasix  Antimicorbials  :    Anti-infectives (From admission, onward)   Start     Dose/Rate Route Frequency Ordered Stop   12/15/19 0015  ciprofloxacin (CIPRO) tablet 250 mg     Discontinue     250 mg Oral 2 times daily 12/15/19 0012 12/19/19 1959      Inpatient Medications  Scheduled Meds: . amiodarone  100 mg Oral Daily  . apixaban  5 mg Oral BID  . carvedilol  6.25 mg Oral BID WC  . cholecalciferol  1,000 Units Oral Daily  . ciprofloxacin  250 mg Oral BID  . ezetimibe  10 mg Oral QHS  . feeding supplement (ENSURE ENLIVE)  237  mL Oral Daily  . insulin aspart  0-15 Units Subcutaneous TID WC  . insulin aspart  3 Units Subcutaneous TID WC  . insulin glargine  15 Units Subcutaneous Daily  .  levothyroxine  50 mcg Oral Q0600  . losartan  25 mg Oral Daily  . simvastatin  40 mg Oral QHS  . spironolactone  25 mg Oral Daily  . venlafaxine XR  150 mg Oral QHS  . vitamin B-12  1,000 mcg Oral Daily   Continuous Infusions: . sodium chloride 10 mL/hr at 12/15/19 0038   PRN Meds:.acetaminophen, nitroGLYCERIN, ondansetron (ZOFRAN) IV, traMADol   Time Spent in minutes  35   See all Orders from today for further details   Jeoffrey Massed M.D on 12/15/2019 at 11:41 AM  To page go to [email or web address] - use universal password  [facility] -  Office  [phone]    Objective:   Vitals:   12/15/19 0930 12/15/19 1000 12/15/19 1030 12/15/19 1130  BP: 106/79 (!) 124/98 107/61 111/64  Pulse: 80 76 72 66  Resp: (!) 21 19 (!) 24 18  Temp:      TempSrc:      SpO2: 96% 96% 94% 100%  Weight:      Height:        Wt Readings from Last 3 Encounters:  12/14/19 63.5 kg  10/29/19 64.4 kg  02/20/19 60.5 kg     Intake/Output Summary (Last 24 hours) at 12/15/2019 1141 Last data filed at 12/15/2019 3254 Gross per 24 hour  Intake --  Output 800 ml  Net -800 ml     Physical Exam Gen Exam:Alert awake-not in any distress HEENT:atraumatic, normocephalic Chest: Bibasilar rales CVS:S1S2 regular Abdomen:soft non tender, non distended Extremities:+++ edema Neurology: Non focal Skin: no rash   Data Review:    CBC Recent Labs  Lab 12/14/19 1355 12/15/19 0416 12/15/19 0522  WBC 4.9  --  5.1  HGB 11.2* 12.9 11.8*  HCT 37.1 38.0 38.6  PLT 228  --  225  MCV 90.5  --  89.4  MCH 27.3  --  27.3  MCHC 30.2  --  30.6  RDW 15.2  --  15.3  LYMPHSABS  --   --  0.8  MONOABS  --   --  0.7  EOSABS  --   --  0.0  BASOSABS  --   --  0.0    Chemistries  Recent Labs  Lab 12/14/19 1355 12/15/19 0416 12/15/19 0522  NA 133* 135 135  K 5.0 3.7 4.0  CL 95*  --  95*  CO2 28  --  29  GLUCOSE 436*  --  366*  BUN 15  --  15  CREATININE 1.40*  --  1.31*  CALCIUM 9.1  --  8.7*  AST   --   --  28  ALT  --   --  18  ALKPHOS  --   --  65  BILITOT  --   --  0.5   ------------------------------------------------------------------------------------------------------------------ No results for input(s): CHOL, HDL, LDLCALC, TRIG, CHOLHDL, LDLDIRECT in the last 72 hours.  Lab Results  Component Value Date   HGBA1C 9.3 (H) 10/29/2019   ------------------------------------------------------------------------------------------------------------------ No results for input(s): TSH, T4TOTAL, T3FREE, THYROIDAB in the last 72 hours.  Invalid input(s): FREET3 ------------------------------------------------------------------------------------------------------------------ Recent Labs    12/15/19 0522  FERRITIN 72    Coagulation profile No results for input(s): INR, PROTIME in the last 168 hours.  Recent Labs    12/15/19 0522  DDIMER 1.24*    Cardiac Enzymes No results for input(s): CKMB, TROPONINI, MYOGLOBIN in the last 168 hours.  Invalid input(s): CK ------------------------------------------------------------------------------------------------------------------    Component Value Date/Time   BNP 3,317.2 (H) 12/14/2019 2302    Micro Results Recent Results (from the past 240 hour(s))  SARS Coronavirus 2 by RT PCR (hospital order, performed in [facility] hospital lab) Nasopharyngeal Nasopharyngeal Swab     Status: Abnormal   Collection Time: 12/14/19  7:59 PM   Specimen: Nasopharyngeal Swab  Result Value Ref Range Status   SARS Coronavirus 2 POSITIVE (A) NEGATIVE Final    Comment: RESULT CALLED TO, READ BACK BY AND VERIFIED WITH: RN C PRICE @2203  12/14/19 BY S GEZAHEGN (NOTE) SARS-CoV-2 target nucleic acids are DETECTED  SARS-CoV-2 RNA is generally detectable in upper respiratory specimens  during the acute phase of infection.  Positive results are indicative  of the presence of the identified virus, but do not rule out bacterial infection or co-infection  with other pathogens not detected by the test.  Clinical correlation with patient history and  other diagnostic information is necessary to determine patient infection status.  The expected result is negative.  Fact Sheet for Patients:   BoilerBrush.com.cyhttps://www.fda.gov/media/136312/download   Fact Sheet for Healthcare Providers:   https://pope.com/https://www.fda.gov/media/136313/download    This test is not yet approved or cleared by the [place] FDA and  has been authorized for detection and/or diagnosis of SARS-CoV-2 by FDA under an Emergency Use Authorization (EUA).  This EUA will remain in effect (meaning this  test can be used) for the duration of  the COVID-19 declaration under Section 564(b)(1) of the Act, 21 U.S.C. section 360-bbb-3(b)(1), unless the authorization is terminated or revoked sooner.  Performed at [facility], [address] [place], [place] [address]     Radiology Reports DG Chest 2 View  Result Date: 12/14/2019 CLINICAL DATA:  Chest pain EXAM: CHEST - 2 VIEW COMPARISON:  02/07/2018 FINDINGS: Cardiomegaly status post median sternotomy. Left chest multi lead pacer. Mild diffuse interstitial pulmonary opacity. Small bilateral pleural effusions. Disc degenerative disease of the thoracic spine. IMPRESSION: Cardiomegaly with mild diffuse interstitial pulmonary opacity and small bilateral pleural effusions, findings consistent with congestive heart failure. No focal airspace opacity. Electronically Signed   By: Lauralyn PrimesAlex  Bibbey M.D.   On: 12/14/2019 14:56   DG Chest Port 1V same Day  Result Date: 12/15/2019 CLINICAL DATA:  Shortness of breath. EXAM: PORTABLE CHEST 1 VIEW COMPARISON:  12/14/2019 FINDINGS: The heart is enlarged but appears stable. Moderate central vascular congestion and mild interstitial edema along with small bilateral pleural effusions all suggesting CHF. No definite infiltrates or pneumothorax. IMPRESSION: CHF with small bilateral pleural effusions. Electronically Signed    By: Rudie MeyerP.  Gallerani M.D.   On: 12/15/2019 07:40

## 2019-12-15 NOTE — ED Notes (Signed)
Pt cbg 60, hypoglycemic protocol implemented, pt taking PO, juice provided, recheck cbg 69, hypoglycemic protocol implemented, pt given juice, recheck cbg 81, Pt remains asymptomatic. Dinner provided. Will continue to monitor.

## 2019-12-15 NOTE — ED Notes (Signed)
Dinner ordered 

## 2019-12-15 NOTE — Progress Notes (Signed)
  Echocardiogram 2D Echocardiogram with definity has been performed.  Leta Jungling M 12/15/2019, 12:55 PM

## 2019-12-15 NOTE — Consult Note (Addendum)
Advanced Heart Failure Team Consult Note   Primary Physician: Georgianne Fick, MD PCP-Cardiologist:  Chrystie Nose, MD  [facility]: Dr. Shirlee Latch   Reason for Consultation: Acute on chronic systolic heart failure CHF   HPI:    [name] is seen today for evaluation of acute on chronic systolic heart failure at the request of Dr. Jerral Ralph, Internal Medicine   74 yo with history of CAD, ischemic cardiomyopathy, poorly controlled T2DM and PAF on Eliquis. Followed by Dr. Shirlee Latch but not seen in clinic since early 2020.   She had CABG x 4 in 2008.  Echo in 3/13 was 40-45%.  By 10/18, EF had dropped to 20% by echo.    She was admitted in 1/19 with CHF exacerbation/NSTEMI.  LHC showed occluded SVG-PDA and serial 70% and 90% stenoses in the proximal to mid RCA.  She had atherectomy with DES x 2 to RCA.  Echo in 1/19 with EF 20-25%.  She was diuresed and had left thoracentesis for pleural effusion.   She developed lightheadedness/orthostasis and AKI with hyperkalemia. Entresto and spironolactone were stopped.   In 5/19, [employer]onic ICD was placed. Followed by Dr. Graciela Husbands. Also w/ h/o PVCs, felt to be contributing to her CM. holter monitor 6/19 showed 20% PVC burden. She is now on amiodarone for suppression.   She presented to the [facility] ED yesterday, 8/8, w/ complaint of a 2 day h/o chest pain, described as chest pressure w/ associated increased SOB and LEE.   In ED, she was found to be COVID positive (only received 1 out of 2 Pfizer vaccine, missed 2nd dose). CXR w/ cardiomegaly w/ mild diffuse interstitial pulmonary opacity and small bilateral pleural effusions, findings c/w CHF. BNP also markedly elevated at 4,206. Hs Troponin low level and flat, 23>>23>>36. EKG shows NSR w/ LBBB (new). BMP shows renal insuffiencey, SCr 1.40 on admit (baseline ~1.1-1.2). K 5.0. NA 133. UA also suggestive of UTI. CRP mildly elevated at 1.6. PCT <0.10.   She has been admitted by IM and started on abx for UTI and IV  Lasix for CHF.   Remains in ED awaiting bed. Getting 40 mg IV lasix bid. Strict I/Os currently not being recorded. BNP however trending down, from 4206>>3,317. Repeat CXR today still shows moderate central vascular congestion and mild interstitial edema w/ small pleural effusions. No definite infiltrates. Repeat 2D echo has been performed. Full Interpretation pending, however reviewed by Dr. Gala Romney and EF appears much lower than previous study, now <10%. SCr improving, down to 1.31 today. K 4.0. BP normotensive. On Spiro, losartan and Coreg.   Continues w/ intermittent CP radiating to jaw. Currently pain free.    Echo 09/03/2017  Study Conclusions   - Left ventricle: The cavity size was mildly dilated. Wall  thickness was normal. Systolic function was severely reduced. The  estimated ejection fraction was in the range of 25% to 30%.  Diffuse hypokinesis. Doppler parameters are consistent with  abnormal left ventricular relaxation (grade 1 diastolic  dysfunction).  - Aortic valve: There was no stenosis.  - Mitral valve: Mildly calcified annulus. There was trivial  regurgitation.  - Left atrium: The atrium was mildly dilated.  - Right ventricle: The cavity size was normal. Systolic function  was mildly reduced.  - Tricuspid valve: Peak RV-RA gradient (S): 21 mm Hg.  - Pulmonary arteries: PA peak pressure: 24 mm Hg (S).  - Inferior vena cava: The vessel was normal in size. The  respirophasic diameter changes were in  the normal range (>= 50%),  consistent with normal central venous pressure.   Impressions:   - Mildly dilated LV with EF 25-30%. Diffuse hypokinesis. Normal RV  size with mildly decreased systolic function. No significant  valvular abnormalities.   Repeat Echo 12/15/19  pending  Review of Systems: [y] = yes, [ ]  = no   . General: Weight gain [ ] ; Weight loss [ ] ; Anorexia [ ] ; Fatigue [ ] ; Fever [ ] ; Chills [ ] ; Weakness [ ]   . Cardiac: Chest  pain/pressure [Y ]; Resting SOB [ ] ; Exertional SOB [ Y]; Orthopnea [ ] ; Pedal Edema [Y ]; Palpitations [ ] ; Syncope [ ] ; Presyncope [ ] ; Paroxysmal nocturnal dyspnea[ ]   . Pulmonary: Cough [ ] ; Wheezing[ ] ; Hemoptysis[ ] ; Sputum [ ] ; Snoring [ ]   . GI: Vomiting[ ] ; Dysphagia[ ] ; Melena[ ] ; Hematochezia [ ] ; Heartburn[ ] ; Abdominal pain [ ] ; Constipation [ ] ; Diarrhea [ ] ; BRBPR [ ]   . GU: Hematuria[ ] ; Dysuria [ ] ; Nocturia[ ]   . Vascular: Pain in legs with walking [ ] ; Pain in feet with lying flat [ ] ; Non-healing sores [ ] ; Stroke [ ] ; TIA [ ] ; Slurred speech [ ] ;  . Neuro: Headaches[ ] ; Vertigo[ ] ; Seizures[ ] ; Paresthesias[ ] ;Blurred vision [ ] ; Diplopia [ ] ; Vision changes [ ]   . Ortho/Skin: Arthritis [ ] ; Joint pain [ ] ; Muscle pain [ ] ; Joint swelling [ ] ; Back Pain [ ] ; Rash [ ]   . Psych: Depression[ ] ; Anxiety[ ]   . Heme: Bleeding problems [ ] ; Clotting disorders [ ] ; Anemia [ ]   . Endocrine: Diabetes [ ] ; Thyroid dysfunction[ ]   Home Medications Prior to Admission medications   Medication Sig Start Date End Date Taking? Authorizing Provider  acetaminophen (TYLENOL) 500 MG tablet Take 1,000 mg by mouth every 6 (six) hours as needed (pain).   Yes [provider]  amiodarone (PACERONE) 200 MG tablet Take 0.5 tablets (100 mg total) by mouth daily. 05/02/18  Yes Laurey MoraleMcLean, Dalton S, MD  carvedilol (COREG) 6.25 MG tablet TAKE 1 TABLET (6.25 MG TOTAL) BY MOUTH 2 (TWO) TIMES DAILY WITH A MEAL (DOSE INCREASE) Patient taking differently: Take 6.25 mg by mouth 2 (two) times daily with a meal.  03/24/19  Yes Clegg, Amy D, NP  cholecalciferol (VITAMIN D) 1000 UNITS tablet Take 1,000 Units by mouth daily.    Yes [provider]  Cyanocobalamin (VITAMIN B-12 PO) Take 1 tablet by mouth daily.    Yes [provider]  ELIQUIS 5 MG TABS tablet TAKE 1 TABLET (5 MG TOTAL) BY MOUTH 2 (TWO) TIMES DAILY (STOP ASPIRIN, CONTINUE PLAVIX) Patient taking differently: Take 5 mg by mouth 2  (two) times daily.  12/02/19  Yes Laurey MoraleMcLean, Dalton S, MD  ezetimibe (ZETIA) 10 MG tablet Take 1 tablet (10 mg total) by mouth daily. Patient taking differently: Take 10 mg by mouth at bedtime.  11/26/18 12/30/19 Yes Clegg, Amy D, NP  feeding supplement, ENSURE ENLIVE, (ENSURE ENLIVE) LIQD Take 237 mLs by mouth daily. 02/10/18  Yes Hongalgi, Maximino GreenlandAnand D, MD  ferrous sulfate 325 (65 FE) MG EC tablet Take 325 mg by mouth daily.   Yes [provider]  insulin NPH Human (NOVOLIN N) 100 UNIT/ML injection Inject 12 Units into the skin at bedtime.   Yes [provider]  insulin regular (NOVOLIN R) 100 units/mL injection Inject 3-4 Units into the skin See admin instructions. Inject 3-4 units subcutaneously up to three times daily with meals  Yes [provider]  levothyroxine (SYNTHROID) 50 MCG tablet TAKE 1 TABLET EVERY DAY Patient taking differently: Take 50 mcg by mouth daily before breakfast.  11/11/19  Yes Hilty, Lisette Abu, MD  losartan (COZAAR) 25 MG tablet TAKE 1 TABLET EVERY EVENING Patient taking differently: Take 25 mg by mouth daily.  03/26/19  Yes Shakyla Nolley, Bevelyn Buckles, MD  simvastatin (ZOCOR) 40 MG tablet TAKE 1 TABLET AT BEDTIME Patient taking differently: Take 40 mg by mouth at bedtime.  09/02/19  Yes Laurey Morale, MD  torsemide (DEMADEX) 20 MG tablet Take 20 mg by mouth daily.    Yes [provider]  venlafaxine XR (EFFEXOR-XR) 150 MG 24 hr capsule Take 1 capsule (150 mg total) by mouth daily. Patient taking differently: Take 150 mg by mouth at bedtime.  11/26/18  Yes Clegg, Amy D, NP  vitamin C (ASCORBIC ACID) 500 MG tablet Take 500 mg by mouth daily.   Yes [provider]    Past Medical History: Past Medical History:  Diagnosis Date  . Cholelithiases   . Coronary artery disease 02/2007   CABG x4  . Diabetes (HCC)   . Dyslipidemia   . HTN (hypertension)   . Hypothyroid   . PAF (paroxysmal atrial fibrillation) (HCC)    post op    Past  Surgical History: Past Surgical History:  Procedure Laterality Date  . ABDOMINAL HYSTERECTOMY    . CARDIAC CATHETERIZATION  03/01/2007   LAD with 99% prox lesion, at birfucation of Cfx there is 99% lesion, at bifurcation of large OM1 there is 99% lesion, 70% prox lesion at ramus intermedius, 90% lesion in prox RCA (Dr. Claudia Desanctis) - susequent CABG  . CORONARY ARTERY BYPASS GRAFT  03/05/2007   LIMA-LAD, SVG-ramus intermediate. SVG-OM, SVG-PDA (Dr. Donata Clay)  . CORONARY ATHERECTOMY N/A 05/17/2017   Procedure: CORONARY ATHERECTOMY;  Surgeon: Lennette Bihari, MD;  Location: [facility] INVASIVE CV LAB;  Service: Cardiovascular;  Laterality: N/A;  . CORONARY STENT INTERVENTION N/A 05/17/2017   Procedure: CORONARY STENT INTERVENTION;  Surgeon: Lennette Bihari, MD;  Location: [facility] INVASIVE CV LAB;  Service: Cardiovascular;  Laterality: N/A;  . ICD IMPLANT N/A 10/05/2017   Procedure: ICD IMPLANT;  Surgeon: Duke Salvia, MD;  Location: [facility] INVASIVE CV LAB;  Service: Cardiovascular;  Laterality: N/A;  . INCONTINENCE SURGERY  2009  . IR ANGIOGRAM SELECTIVE EACH ADDITIONAL VESSEL  01/31/2017  . IR ANGIOGRAM SELECTIVE EACH ADDITIONAL VESSEL  01/31/2017  . IR ANGIOGRAM VISCERAL SELECTIVE  01/31/2017  . IR EMBO ART  VEN HEMORR LYMPH EXTRAV  INC GUIDE ROADMAPPING  01/31/2017  . IR THORACENTESIS ASP PLEURAL SPACE W/IMG GUIDE  05/11/2017  . IR THORACENTESIS ASP PLEURAL SPACE W/IMG GUIDE  05/18/2017  . IR US GUIDE VASC ACCESS RIGHT  01/31/2017  . LEFT HEART CATH AND CORS/GRAFTS ANGIOGRAPHY N/A 05/15/2017   Procedure: LEFT HEART CATH AND CORS/GRAFTS ANGIOGRAPHY;  Surgeon: Lennette Bihari, MD;  Location: [facility] INVASIVE CV LAB;  Service: Cardiovascular;  Laterality: N/A;  . LEFT HEART CATH AND CORS/GRAFTS ANGIOGRAPHY N/A 05/17/2017   Procedure: LEFT HEART CATH AND CORS/GRAFTS ANGIOGRAPHY;  Surgeon: Lennette Bihari, MD;  Location: [facility] INVASIVE CV LAB;  Service: Cardiovascular;  Laterality: N/A;  . NM MYOCAR PERF WALL MOTION  07/2011    bruce myoview - mild perfusion defect in basal inferolateral & mid inferolateral regions (attenuation artifact, but prior infarct annote be excluded)' EF 49%; low risk  . TRANSTHORACIC ECHOCARDIOGRAM  07/2011   EF 45-50%, mild  septal hypokinesis, mild inferior wall hypokinesis; RV systolc function mildly reduced; borderline LA enlargement; mild MR & TR; AV mildly sclerotic; mild pulm valve regurg     Family History: Family History  Problem Relation Age of Onset  . Diabetes Mother 58  . Heart disease Mother   . Hyperlipidemia Mother   . Cancer Father   . Heart attack Father 27  . Cancer Maternal Grandmother   . Cancer Paternal Grandmother   . Thyroid disease Sister   . Diabetes Child     Social History: Social History   Socioeconomic History  . Marital status: Married    Spouse name: Not on file  . Number of children: 1  . Years of education: 37  . Highest education level: Not on file  Occupational History  . Occupation: retired    Associate Professor: [employer]  Tobacco Use  . Smoking status: Never Smoker  . Smokeless tobacco: Never Used  Vaping Use  . Vaping Use: Never used  Substance and Sexual Activity  . Alcohol use: No  . Drug use: No  . Sexual activity: Never    Birth control/protection: Abstinence  Other Topics Concern  . Not on file  Social History Narrative  . Not on file   Social Determinants of Health   Financial Resource Strain:   . Difficulty of Paying Living Expenses:   Food Insecurity:   . Worried About Programme researcher, broadcasting/film/video in the Last Year:   . Barista in the Last Year:   Transportation Needs:   . Freight forwarder (Medical):   Marland Kitchen Lack of Transportation (Non-Medical):   Physical Activity:   . Days of Exercise per Week:   . Minutes of Exercise per Session:   Stress:   . Feeling of Stress :   Social Connections:   . Frequency of Communication with Friends and Family:   . Frequency of Social Gatherings with Friends and Family:   .  Attends Religious Services:   . Active Member of Clubs or Organizations:   . Attends Banker Meetings:   Marland Kitchen Marital Status:     Allergies:  Allergies  Allergen Reactions  . Crestor [Rosuvastatin] Other (See Comments)    Pain in hips and joints  . Lipitor [Atorvastatin] Other (See Comments)    Very bad pain in hips and joints    Objective:    Vital Signs:   Temp:  [98.7 F (37.1 C)-99.2 F (37.3 C)] 98.9 F (37.2 C) (08/09 0616) Pulse Rate:  [65-95] 71 (08/09 1401) Resp:  [16-25] 19 (08/09 1401) BP: (106-147)/(61-98) 110/93 (08/09 1400) SpO2:  [88 %-100 %] 98 % (08/09 1401)    Weight change: Filed Weights   12/14/19 1418  Weight: 63.5 kg    Intake/Output:   Intake/Output Summary (Last 24 hours) at 12/15/2019 1438 Last data filed at 12/15/2019 2376 Gross per 24 hour  Intake --  Output 800 ml  Net -800 ml      Physical Exam    General:  Elderly appearing. No resp difficulty HEENT: normal Neck: supple. Neck veins distended, JVD elevated to jaw . Carotids 2+ bilat; no bruits. No lymphadenopathy or thyromegaly appreciated. Cor: PMI nondisplaced. Regular rate & rhythm. No rubs, gallops or murmurs. Lungs: clear Abdomen: soft, nontender, nondistended. No hepatosplenomegaly. No bruits or masses. Good bowel sounds. Extremities: no cyanosis, clubbing, rash, 1+ bilateral LE edema Neuro: alert & orientedx3, cranial nerves grossly intact. moves all 4 extremities w/o difficulty. Affect pleasant  Telemetry   NSR 90s   EKG    NSR w/ LBBB   Labs   Basic Metabolic Panel: Recent Labs  Lab 12/14/19 1355 12/15/19 0416 12/15/19 0522  NA 133* 135 135  K 5.0 3.7 4.0  CL 95*  --  95*  CO2 28  --  29  GLUCOSE 436*  --  366*  BUN 15  --  15  CREATININE 1.40*  --  1.31*  CALCIUM 9.1  --  8.7*    Liver Function Tests: Recent Labs  Lab 12/15/19 0522  AST 28  ALT 18  ALKPHOS 65  BILITOT 0.5  PROT 6.5  ALBUMIN 3.2*   No results for input(s):  LIPASE, AMYLASE in the last 168 hours. No results for input(s): AMMONIA in the last 168 hours.  CBC: Recent Labs  Lab 12/14/19 1355 12/15/19 0416 12/15/19 0522  WBC 4.9  --  5.1  NEUTROABS  --   --  3.6  HGB 11.2* 12.9 11.8*  HCT 37.1 38.0 38.6  MCV 90.5  --  89.4  PLT 228  --  225    Cardiac Enzymes: No results for input(s): CKTOTAL, CKMB, CKMBINDEX, TROPONINI in the last 168 hours.  BNP: BNP (last 3 results) Recent Labs    12/14/19 1355 12/14/19 2302  BNP 4,206.6* 3,317.2*    ProBNP (last 3 results) No results for input(s): PROBNP in the last 8760 hours.   CBG: Recent Labs  Lab 12/15/19 0023 12/15/19 0741 12/15/19 1104  GLUCAP 374* 293* 265*    Coagulation Studies: No results for input(s): LABPROT, INR in the last 72 hours.   Imaging   DG Chest Port 1V same Day  Result Date: 12/15/2019 CLINICAL DATA:  Shortness of breath. EXAM: PORTABLE CHEST 1 VIEW COMPARISON:  12/14/2019 FINDINGS: The heart is enlarged but appears stable. Moderate central vascular congestion and mild interstitial edema along with small bilateral pleural effusions all suggesting CHF. No definite infiltrates or pneumothorax. IMPRESSION: CHF with small bilateral pleural effusions. Electronically Signed   By: Rudie Meyer M.D.   On: 12/15/2019 07:40      Medications:     Current Medications: . amiodarone  100 mg Oral Daily  . apixaban  5 mg Oral BID  . carvedilol  6.25 mg Oral BID WC  . cholecalciferol  1,000 Units Oral Daily  . ezetimibe  10 mg Oral QHS  . feeding supplement (ENSURE ENLIVE)  237 mL Oral Daily  . furosemide  40 mg Intravenous BID  . insulin aspart  0-15 Units Subcutaneous TID WC  . insulin aspart  3 Units Subcutaneous TID WC  . insulin glargine  15 Units Subcutaneous Daily  . levothyroxine  50 mcg Oral Q0600  . losartan  25 mg Oral Daily  . pantoprazole  40 mg Oral Q1200  . simvastatin  40 mg Oral QHS  . spironolactone  25 mg Oral Daily  . venlafaxine XR  150  mg Oral QHS  . vitamin B-12  1,000 mcg Oral Daily     Infusions: . sodium chloride 10 mL/hr at 12/15/19 0038  . [START ON 12/16/2019] cefTRIAXone (ROCEPHIN)  IV        Assessment/Plan   1. COVID Infection  - not fully vaccinated. Only completed 1 out of 2 of Pfizer vaccine (skiped 2nd dose) - CXR c/w CHF. No infiltrates - CRP only mildly elevated at 1.6. Level not c/w active pneumonitis  - per IM, awaiting to see if hypoxia improves w/ diuresis/ management  of CHF. If no improvement or worsens, will treat w/ steroids/remdesivir   2. Acute on Chronic Systolic Heart Failure - Echo in 3/13 was 40-45%.  By 10/18, EF had dropped to 20% by echo. Most recent echo 4/19 EF 25-30%. RV ok. Ischemic CM - now w/ volume overload. CXR c/w edema. Admit BNP 4200 - Continue IV Lasix 40 mg bid  - strict I/Os, daily wts and daily BMPs to follow renal function/ electrolytes  - continue losartan 25 mg (Did not tolerate Entresto previously given orthostatic hypotension) - spironolactone 25 mg added today by IM. Will need to monitor K (previously discontinued in the past for hyperkalemia. K 5.0 on admit) - stop Coreg w/ worsening LV function  - consider future addition of a SGLT2i (hold off for now given active UTI) - has MDT ICD followed by Dr. Graciela Husbands. May benefit from CRT-D upgrade given LBBB once infection clears     3. Chest Pain/CAD - s/p CABG x 4 in 2008  - NSTEMI 05/2017>>LHC w/ occluded SVG-PDA and serial 70% and 90% stenoses in the proximal to mid RCA, treated w/ DES x 2 to RCA. - HS troponin this admit low level and flat, 23>>23>>36 - however given symptomatology and new LBBB, will need repeat LHC prior to d/c. She is currently CP free  - continue medical management w/ statin. No ASA given Eliquis for PAF. Hold  blocker w/ severely reduced LVEF   4. UTI  + UA, cultures pending  - treating w/ Rocephin  5. Renal Insuffiencey  - baseline SCr ~1.1-1.2 - SCr 1.4 on admit - improving w/  diuresis, 1.3 today  - monitor w/ Lasix   6. T2DM - poorly controlled. Most recent hgb A1c 9.3  - insulin per PPM - consider future addition of SGLT2i given concomitant systolic heart failure   7. PAF - NSR on EKG - continue amiodarone 100 mg daily  - on Eliquis for a/c  8. PVCs - Holter 10/2017 w/ 20% PVC burden - on amiodarone 100 mg daily for suppression    9. LBBB - consider CRT-D upgrade once infection clears   Length of Stay: 0  Robbie Lis, PA-C  12/15/2019, 2:38 PM  Advanced Heart Failure Team Pager [phone] (M-F; 7a - 4p)  Please contact [facility] for night-coverage after hours (4p -7a ) and weekends on [email or web address]  Patient seen and examined with the above-signed Advanced Practice Provider and/or Housestaff. I personally reviewed laboratory data, imaging studies and relevant notes. I independently examined the patient and formulated the important aspects of the plan. I have edited the note to reflect any of my changes or salient points. I have personally discussed the plan with the patient and/or family.  74 y/o woman with CAD s/p CABG 2008, DM2, NSTEMI 2019 with occluded SVG to PDA, ischemic CM EF 25-30% in 4/19.   Admitted with 2 weeks of increasing SOB and chest pressure radiating to neck. + cough. Subjective fevers but every time she checks temp ok. In ER found to be COVID +. BNP 4,200. Hs trop 23 -> 23.  CXR c/w CHF and bilateral effusions.   Echo done today and reviewed personally EF 15-20% with moderate to severe RV dysfunction  Responding well to lasix 40 IV bid but having incontinence  General:  Elderly . No resp difficulty HEENT: normal Neck: supple. JVP to jaw  Carotids 2+ bilat; no bruits. No lymphadenopathy or thryomegaly appreciated. Cor: PMI nondisplaced. Regular rate & rhythm. + crackles Lungs: clear Abdomen:  soft, nontender, nondistended. No hepatosplenomegaly. No bruits or masses. Good bowel sounds. Extremities: no cyanosis, clubbing,  rash, 1-2+ edema Neuro: alert & orientedx3, cranial nerves grossly intact. moves all 4 extremities w/o difficulty. Affect pleasant  She has a/c systolic HF and AKI in setting of COVID + status (had 1st dose of vaccine but skipped #2). Most of her symptoms seem to be related to HF and not COVID. Echo shows worsening EF. hstrop is negative so not COVID myocarditis. Having frequent episodes of CP radiating to neck. ECG with LBBB (129 ms). No evidence of low output.   Agree with IV lasix. Have adjusted HF meds. (did not tolerate spiro in past due to hyperkalemia) Will need R/L cath this admit when COVID permits. Will need to hold Eqius and switch to heparin in anticipation of cath.   Arvilla Meres, MD  4:23 PM

## 2019-12-15 NOTE — Progress Notes (Signed)
Floor coverage  Patient with history of CAD, ICM with EF 25 to 30% with pacemaker/defibrillator, paroxysmal A. fib, diabetes admitted 8/8 for complaints of chest pain, shortness of breath, and peripheral edema.  Troponin mildly elevated but flat.  EKG without acute changes.  BNP significantly elevated at 3317.  Chest x-ray with findings consistent with CHF.  IV Lasix 40 mg every 12 hrs ordered.  Patient also positive for COVID-19.  She has received her first dose of Pfizer vaccine and was not started on treatment as her shortness of breath was felt to be related to decompensated CHF and she did not endorse any other Covid symptoms.  Afebrile.  Nursing staff stated that the patient was previously on room air but now her oxygen saturation dropped to 79%.  She was placed on 4 L supplemental oxygen.  Patient was seen and examined at bedside.  Resting comfortably and no signs of distress.  No tachypnea or increased work of breathing.  Satting 100% on 4 L supplemental oxygen.  Not tachycardic.  Continues to complain of cough, shortness of breath, and substernal chest pain for several days.  Heart: RRR Lungs: Bibasilar rales, scattered wheezing Bilateral lower extremities: 4+ pitting edema  Plan -Continue Lasix -Stat ABG -Albuterol inhaler -Repeat troponin and EKG -Covid inflammatory markers pending

## 2019-12-16 DIAGNOSIS — I10 Essential (primary) hypertension: Secondary | ICD-10-CM | POA: Diagnosis not present

## 2019-12-16 DIAGNOSIS — I509 Heart failure, unspecified: Secondary | ICD-10-CM | POA: Diagnosis not present

## 2019-12-16 DIAGNOSIS — R079 Chest pain, unspecified: Secondary | ICD-10-CM | POA: Diagnosis not present

## 2019-12-16 DIAGNOSIS — I5043 Acute on chronic combined systolic (congestive) and diastolic (congestive) heart failure: Secondary | ICD-10-CM | POA: Diagnosis not present

## 2019-12-16 DIAGNOSIS — U071 COVID-19: Secondary | ICD-10-CM | POA: Diagnosis not present

## 2019-12-16 LAB — COMPREHENSIVE METABOLIC PANEL
ALT: 18 U/L (ref 0–44)
AST: 30 U/L (ref 15–41)
Albumin: 3 g/dL — ABNORMAL LOW (ref 3.5–5.0)
Alkaline Phosphatase: 63 U/L (ref 38–126)
Anion gap: 13 (ref 5–15)
BUN: 17 mg/dL (ref 8–23)
CO2: 27 mmol/L (ref 22–32)
Calcium: 8.4 mg/dL — ABNORMAL LOW (ref 8.9–10.3)
Chloride: 94 mmol/L — ABNORMAL LOW (ref 98–111)
Creatinine, Ser: 1.15 mg/dL — ABNORMAL HIGH (ref 0.44–1.00)
GFR calc Af Amer: 54 mL/min — ABNORMAL LOW (ref >60)
GFR calc non Af Amer: 47 mL/min — ABNORMAL LOW (ref >60)
Glucose, Bld: 84 mg/dL (ref 70–99)
Potassium: 3.8 mmol/L (ref 3.5–5.1)
Sodium: 134 mmol/L — ABNORMAL LOW (ref 135–145)
Total Bilirubin: 0.7 mg/dL (ref 0.3–1.2)
Total Protein: 6.1 g/dL — ABNORMAL LOW (ref 6.5–8.1)

## 2019-12-16 LAB — CBC WITH DIFFERENTIAL/PLATELET
Abs Immature Granulocytes: 0.01 10*3/uL (ref 0.00–0.07)
Basophils Absolute: 0 10*3/uL (ref 0.0–0.1)
Basophils Relative: 0 %
Eosinophils Absolute: 0 10*3/uL (ref 0.0–0.5)
Eosinophils Relative: 0 %
HCT: 39 % (ref 36.0–46.0)
Hemoglobin: 11.8 g/dL — ABNORMAL LOW (ref 12.0–15.0)
Immature Granulocytes: 0 %
Lymphocytes Relative: 23 %
Lymphs Abs: 1.1 10*3/uL (ref 0.7–4.0)
MCH: 26.8 pg (ref 26.0–34.0)
MCHC: 30.3 g/dL (ref 30.0–36.0)
MCV: 88.6 fL (ref 80.0–100.0)
Monocytes Absolute: 0.8 10*3/uL (ref 0.1–1.0)
Monocytes Relative: 16 %
Neutro Abs: 2.8 10*3/uL (ref 1.7–7.7)
Neutrophils Relative %: 61 %
Platelets: 196 10*3/uL (ref 150–400)
RBC: 4.4 MIL/uL (ref 3.87–5.11)
RDW: 15.7 % — ABNORMAL HIGH (ref 11.5–15.5)
WBC: 4.6 10*3/uL (ref 4.0–10.5)
nRBC: 0 % (ref 0.0–0.2)

## 2019-12-16 LAB — D-DIMER, QUANTITATIVE: D-Dimer, Quant: 0.96 ug/mL-FEU — ABNORMAL HIGH (ref 0.00–0.50)

## 2019-12-16 LAB — FERRITIN: Ferritin: 78 ng/mL (ref 11–307)

## 2019-12-16 LAB — GLUCOSE, CAPILLARY
Glucose-Capillary: 79 mg/dL (ref 70–99)
Glucose-Capillary: 110 mg/dL — ABNORMAL HIGH (ref 70–99)
Glucose-Capillary: 121 mg/dL — ABNORMAL HIGH (ref 70–99)
Glucose-Capillary: 59 mg/dL — ABNORMAL LOW (ref 70–99)
Glucose-Capillary: 68 mg/dL — ABNORMAL LOW (ref 70–99)

## 2019-12-16 LAB — C-REACTIVE PROTEIN: CRP: 1.4 mg/dL — ABNORMAL HIGH (ref <1.0)

## 2019-12-16 MED ORDER — FUROSEMIDE 10 MG/ML IJ SOLN
80.0000 mg | Freq: Two times a day (BID) | INTRAMUSCULAR | Status: DC
  Administered 2019-12-16 – 2019-12-17 (×3): 80 mg via INTRAVENOUS
  Filled 2019-12-16 (×3): qty 8

## 2019-12-16 MED ORDER — INSULIN GLARGINE 100 UNIT/ML ~~LOC~~ SOLN
10.0000 [IU] | Freq: Every day | SUBCUTANEOUS | Status: DC
  Filled 2019-12-16: qty 0.1

## 2019-12-16 MED ORDER — HEPARIN (PORCINE) 25000 UT/250ML-% IV SOLN
700.0000 [IU]/h | INTRAVENOUS | Status: DC
  Administered 2019-12-16: 850 [IU]/h via INTRAVENOUS
  Filled 2019-12-16: qty 250

## 2019-12-16 MED ORDER — ASPIRIN 81 MG PO CHEW
81.0000 mg | CHEWABLE_TABLET | Freq: Every day | ORAL | Status: DC
  Administered 2019-12-16 – 2019-12-23 (×8): 81 mg via ORAL
  Filled 2019-12-16 (×10): qty 1

## 2019-12-16 MED ORDER — ACETAMINOPHEN 325 MG PO TABS
650.0000 mg | ORAL_TABLET | Freq: Four times a day (QID) | ORAL | Status: DC | PRN
  Administered 2019-12-17 – 2019-12-18 (×3): 650 mg via ORAL
  Filled 2019-12-16 (×4): qty 2

## 2019-12-16 MED ORDER — SODIUM CHLORIDE 0.9% FLUSH
3.0000 mL | Freq: Two times a day (BID) | INTRAVENOUS | Status: DC
  Administered 2019-12-16 – 2019-12-20 (×5): 3 mL via INTRAVENOUS

## 2019-12-16 MED ORDER — POTASSIUM CHLORIDE CRYS ER 20 MEQ PO TBCR
40.0000 meq | EXTENDED_RELEASE_TABLET | Freq: Once | ORAL | Status: AC
  Administered 2019-12-16: 40 meq via ORAL
  Filled 2019-12-16: qty 2

## 2019-12-16 NOTE — Progress Notes (Signed)
ANTICOAGULATION CONSULT NOTE - Initial Consult  Pharmacy Consult for apixaban>heparin Indication: chest pain/ACS  Allergies  Allergen Reactions  . Crestor [Rosuvastatin] Other (See Comments)    Pain in hips and joints  . Lipitor [Atorvastatin] Other (See Comments)    Very bad pain in hips and joints    Patient Measurements: Height: 5\' 2"  (157.5 cm) Weight: 63.5 kg (140 lb) IBW/kg (Calculated) : 50.1 Heparin Dosing Weight: 62.9 kg  Vital Signs: Temp: 99.1 F (37.3 C) (08/10 0710) Temp Source: Oral (08/10 0710) BP: 118/73 (08/10 0710) Pulse Rate: 90 (08/10 0710)  Labs: Recent Labs    12/14/19 1355 12/14/19 1355 12/14/19 1946 12/15/19 0416 12/15/19 0416 12/15/19 0522 12/16/19 0811  HGB 11.2*   < >  --  12.9   < > 11.8* 11.8*  HCT 37.1   < >  --  38.0  --  38.6 39.0  PLT 228  --   --   --   --  225 196  CREATININE 1.40*  --   --   --   --  1.31* 1.15*  TROPONINIHS 23*  --  23*  --   --  36*  --    < > = values in this interval not displayed.    Estimated Creatinine Clearance: 37 mL/min (A) (by C-G formula based on SCr of 1.15 mg/dL (H)).   Medical History: Past Medical History:  Diagnosis Date  . Cholelithiases   . Coronary artery disease 02/2007   CABG x4  . Diabetes (HCC)   . Dyslipidemia   . HTN (hypertension)   . Hypothyroid   . PAF (paroxysmal atrial fibrillation) (HCC)    post op    Medications:  Scheduled:  . amiodarone  100 mg Oral Daily  . aspirin  81 mg Oral Daily  . cholecalciferol  1,000 Units Oral Daily  . ezetimibe  10 mg Oral QHS  . feeding supplement (ENSURE ENLIVE)  237 mL Oral Daily  . furosemide  80 mg Intravenous BID  . insulin aspart  0-15 Units Subcutaneous TID WC  . insulin aspart  3 Units Subcutaneous TID WC  . insulin glargine  15 Units Subcutaneous Daily  . levothyroxine  50 mcg Oral Q0600  . losartan  25 mg Oral Daily  . pantoprazole  40 mg Oral Q1200  . simvastatin  40 mg Oral QHS  . spironolactone  25 mg Oral Daily   . venlafaxine XR  150 mg Oral QHS  . vitamin B-12  1,000 mcg Oral Daily   Infusions:  . sodium chloride Stopped (12/16/19 0640)  . cefTRIAXone (ROCEPHIN)  IV      Assessment: 73 yof found to have COVID-19, now presenting CHF with possible plans for [facility] On apixaban for Afib (LD 8/6 PTA)- received dose this AM on 8/10@0900 .   Hgb 11.8, plt 196. D-dimer 1.24>0.96. No s/sx of bleeding. Will start heparin infusion 12 hours after dose this morning and monitor aPTT and heparin level until correlate.   Goal of Therapy:  Heparin level 0.3-0.7 units/ml aPTT 66-102 seconds Monitor platelets by anticoagulation protocol: Yes   Plan:  Start heparin infusion at 850 units/hr on 8/10@2100  Check aPTT/HL in 8 hours  Monitor daily HL, aPTT until correlate, CBC, and for s/sx of bleeding   10/10, PharmD, BCCCP Clinical Pharmacist  Phone: [phone] 12/16/2019 12:06 PM  Please check AMION for all [facility] phone numbers After 10:00 PM, call Main Pharmacy [phone]

## 2019-12-16 NOTE — Progress Notes (Addendum)
PROGRESS NOTE                                                                                                                                                                                                             Patient Demographics:    [name], is a 74 y.o. female, DOB - [birth year], ZOX:[id number]  Outpatient Primary MD for the patient is Georgianne Fick, MD   Admit date - 12/14/2019   LOS - 1  Chief Complaint  Patient presents with  . Chest Pain       Brief Narrative: Patient is a 74 y.o. female with PMHx of chronic systolic heart failure-s/p AICD in place, PAF on Eliquis, DM-2 who is partially vaccinated against Covid ( received 1 dose of Pfizer vaccine sometime in June) presented with 4-5-day history of worsening shortness of breath, leg swelling, chest pressure radiating to the neck.  She was thought to have decompensated systolic heart failure-and subsequently admitted to the hospitalist service.    She also tested positive for Covid-but thought to be mostly asymptomatic as her presenting symptoms are most consistent with decompensated heart failure.  Significant Events: 8/8>> Admit to [facility] for chest pressure/decompensated heart failure  Significant studies: 12/14/19>>Chest x-ray: Cardiomegaly with mild diffuse interstitial pulmonary opacities/bilateral pleural effusions. 12/15/19>> chest x-ray: CHF with small bilateral pleural effusions. 12/15/19>> echo: EF 15-20%, grade 2 diastolic dysfunction, RV systolic function is severely reduced.  Severe MR, moderate to severe TR 09/03/17>> Echo: EF 25-30%, grade 1 diastolic dysfunction, RV systolic function was mildly reduced  COVID-19 medications: None  Antibiotics: Ciprofloxacin: 8/8>> 8/9 Rocephin: 8/10>> x2 days  Microbiology data: None  Procedures: None  Consults: Advanced heart failure team.  DVT prophylaxis: Eliquis>>IV heparin in anticipation of  [facility]    Subjective:   Feels somewhat better-Down to 1 L of oxygen this morning.  Still with significant lower extremity edema.   Assessment  & Plan :   Acute hypoxic respiratory failure secondary to decompensated systolic heart failure: Some improvement in the volume status-however still significantly overloaded.  Although she tested positive for COVID-19-clinical features are more consistent with decompensated heart failure.  Inflammatory markers are not significantly elevated as well.  Remains on diuretic regimen per advanced CHF team.  Cardiology planning [facility] tomorrow.   Chest pain-history of CAD: Hardly any chest pain this morning-had more chest  pain yesterday-radiating to her neck.  Has history of CAD s/p CABG in subsequent PCI to RCA in 2019.  Troponins are essentially flat and very minimally elevated.  Cardiology planning [facility] tomorrow.    DM-2 (A1c 9.3 on 10/29/2019): She ran out of her insulin a few days back-CBG borderline low this morning-decrease Lantus to 10 units-continue 3 units of NovoLog with meals and SSI.  Follow and optimize.    Recent Labs    12/15/19 2204 12/16/19 0744 12/16/19 1141  GLUCAP 137* 79 110*   PAF: Continue telemetry monitoring-on amiodarone/Coreg for rate control-anticoagulated with Eliquis  HTN: Controlled-continue losartan and Coreg  CKD stage IIIa: Creatinine close to baseline-follow.  UTI: Complains of dysuria-UA somewhat suggestive of UTI-cultures not sent-on Cipro started on admission-switch to Rocephin-x2 additional days  Hypothyroidism: Continue with Synthroid  COVID-19 infection: As noted above-current clinical situation is more consistent with decompensated heart failure-inflammatory markers are not significantly elevated-plan is to observe-diurese-if hypoxia does not improve with diuresis-or worsen significantly-we can then contemplate treating with steroids/remdesivir.   COVID-19 Labs: Recent Labs    12/15/19 0522  12/16/19 0811  DDIMER 1.24* 0.96*  FERRITIN 72 78  CRP 1.6* 1.4*  32123     Component Value Date/Time   BNP 3,317.2 (H) 12/14/2019 2302    Recent Labs  Lab 12/15/19 0522  PROCALCITON <0.10    Lab Results  Component Value Date   SARSCOV2NAA POSITIVE (A) 12/14/2019   SARSCOV2NAA NEGATIVE 10/29/2019     ABG:    Component Value Date/Time   PHART 7.514 (H) 12/15/2019 0416   PCO2ART 34.5 12/15/2019 0416   PO2ART 105 12/15/2019 0416   HCO3 27.7 12/15/2019 0416   TCO2 29 12/15/2019 0416   ACIDBASEDEF 2.0 02/13/2017 2359   O2SAT 99.0 12/15/2019 0416    Vent Settings: N/A   Condition - Stable  Family Communication  :  Daughter-Cindi ( [phone]) updated over the phone 8/10  Code Status :  Full Code  Diet :  Diet Order            Diet heart healthy/carb modified Room service appropriate? Yes; Fluid consistency: Thin  Diet effective now                  Disposition Plan  :   Status is: Inpatient  The patient will require care spanning > 2 midnights and should be moved to inpatient because: Hypoxemia requiring IV Lasix and oxygen supplementation  Dispo: The patient is from: Home              Anticipated d/c is to: Home              Anticipated d/c date is: 2 days              Patient currently is not medically stable to d/c.   Barriers to discharge: Hypoxia requiring O2 supplementation/IV Lasix  Antimicorbials  :    Anti-infectives (From admission, onward)   Start     Dose/Rate Route Frequency Ordered Stop   12/16/19 0600  cefTRIAXone (ROCEPHIN) 1 g in sodium chloride 0.9 % 100 mL IVPB     Discontinue     1 g 200 mL/hr over 30 Minutes Intravenous Every 24 hours 12/15/19 1159 12/18/19 0559   12/15/19 0015  ciprofloxacin (CIPRO) tablet 250 mg  Status:  Discontinued        250 mg Oral 2 times daily 12/15/19 0012 12/15/19 1158      Inpatient Medications  Scheduled Meds: .  amiodarone  100 mg Oral Daily  . aspirin  81 mg Oral Daily  . cholecalciferol   1,000 Units Oral Daily  . ezetimibe  10 mg Oral QHS  . feeding supplement (ENSURE ENLIVE)  237 mL Oral Daily  . furosemide  80 mg Intravenous BID  . insulin aspart  0-15 Units Subcutaneous TID WC  . insulin aspart  3 Units Subcutaneous TID WC  . insulin glargine  15 Units Subcutaneous Daily  . levothyroxine  50 mcg Oral Q0600  . losartan  25 mg Oral Daily  . pantoprazole  40 mg Oral Q1200  . simvastatin  40 mg Oral QHS  . spironolactone  25 mg Oral Daily  . venlafaxine XR  150 mg Oral QHS  . vitamin B-12  1,000 mcg Oral Daily   Continuous Infusions: . sodium chloride Stopped (12/16/19 0640)  . cefTRIAXone (ROCEPHIN)  IV    . heparin     PRN Meds:.acetaminophen, nitroGLYCERIN, ondansetron (ZOFRAN) IV, traMADol   Time Spent in minutes  35   See all Orders from today for further details   Jeoffrey Massed M.D on 12/16/2019 at 2:20 PM  To page go to [email or web address] - use universal password  [facility] -  Office  [phone]    Objective:   Vitals:   12/16/19 0610 12/16/19 0710 12/16/19 0900 12/16/19 1243  BP: 117/70 118/73  109/68  Pulse:  90  85  Resp: (!) 21 (!) 25  19  Temp:  99.1 F (37.3 C)  99 F (37.2 C)  TempSrc:  Oral  Oral  SpO2:  97%  93%  Weight:   63.5 kg   Height:   5\' 2"  (1.575 m)     Wt Readings from Last 3 Encounters:  12/16/19 63.5 kg  10/29/19 64.4 kg  02/20/19 60.5 kg    No intake or output data in the 24 hours ending 12/16/19 1420   Physical Exam Gen Exam:Alert awake-not in any distress HEENT:atraumatic, normocephalic Chest: B/L clear to auscultation anteriorly CVS:S1S2 regular Abdomen:soft non tender, non distended Extremities:++ edema Neurology: Non focal Skin: no rash   Data Review:    CBC Recent Labs  Lab 12/14/19 1355 12/15/19 0416 12/15/19 0522 12/16/19 0811  WBC 4.9  --  5.1 4.6  HGB 11.2* 12.9 11.8* 11.8*  HCT 37.1 38.0 38.6 39.0  PLT 228  --  225 196  MCV 90.5  --  89.4 88.6  MCH 27.3  --  27.3 26.8   MCHC 30.2  --  30.6 30.3  RDW 15.2  --  15.3 15.7*  LYMPHSABS  --   --  0.8 1.1  MONOABS  --   --  0.7 0.8  EOSABS  --   --  0.0 0.0  BASOSABS  --   --  0.0 0.0    Chemistries  Recent Labs  Lab 12/14/19 1355 12/15/19 0416 12/15/19 0522 12/16/19 0811  NA 133* 135 135 134*  K 5.0 3.7 4.0 3.8  CL 95*  --  95* 94*  CO2 28  --  29 27  GLUCOSE 436*  --  366* 84  BUN 15  --  15 17  CREATININE 1.40*  --  1.31* 1.15*  CALCIUM 9.1  --  8.7* 8.4*  AST  --   --  28 30  ALT  --   --  18 18  ALKPHOS  --   --  65 63  BILITOT  --   --  0.5 0.7   ------------------------------------------------------------------------------------------------------------------  No results for input(s): CHOL, HDL, LDLCALC, TRIG, CHOLHDL, LDLDIRECT in the last 72 hours.  Lab Results  Component Value Date   HGBA1C 9.3 (H) 10/29/2019   ------------------------------------------------------------------------------------------------------------------ No results for input(s): TSH, T4TOTAL, T3FREE, THYROIDAB in the last 72 hours.  Invalid input(s): FREET3 ------------------------------------------------------------------------------------------------------------------ Recent Labs    12/15/19 0522 12/16/19 0811  FERRITIN 72 78    Coagulation profile No results for input(s): INR, PROTIME in the last 168 hours.  Recent Labs    12/15/19 0522 12/16/19 0811  DDIMER 1.24* 0.96*    Cardiac Enzymes No results for input(s): CKMB, TROPONINI, MYOGLOBIN in the last 168 hours.  Invalid input(s): CK ------------------------------------------------------------------------------------------------------------------    Component Value Date/Time   BNP 3,317.2 (H) 12/14/2019 2302    Micro Results Recent Results (from the past 240 hour(s))  SARS Coronavirus 2 by RT PCR (hospital order, performed in [facility] hospital lab) Nasopharyngeal Nasopharyngeal Swab     Status: Abnormal   Collection Time: 12/14/19   7:59 PM   Specimen: Nasopharyngeal Swab  Result Value Ref Range Status   SARS Coronavirus 2 POSITIVE (A) NEGATIVE Final    Comment: RESULT CALLED TO, READ BACK BY AND VERIFIED WITH: RN C PRICE @2203  12/14/19 BY S GEZAHEGN (NOTE) SARS-CoV-2 target nucleic acids are DETECTED  SARS-CoV-2 RNA is generally detectable in upper respiratory specimens  during the acute phase of infection.  Positive results are indicative  of the presence of the identified virus, but do not rule out bacterial infection or co-infection with other pathogens not detected by the test.  Clinical correlation with patient history and  other diagnostic information is necessary to determine patient infection status.  The expected result is negative.  Fact Sheet for Patients:   02/13/20   Fact Sheet for Healthcare Providers:   BoilerBrush.com.cy    This test is not yet approved or cleared by the https://pope.com/ FDA and  has been authorized for detection and/or diagnosis of SARS-CoV-2 by FDA under an Emergency Use Authorization (EUA).  This EUA will remain in effect (meaning this  test can be used) for the duration of  the COVID-19 declaration under Section 564(b)(1) of the Act, 21 U.S.C. section 360-bbb-3(b)(1), unless the authorization is terminated or revoked sooner.  Performed at [facility], [address] [place], [place]     Radiology Reports DG Chest 2 View  Result Date: 12/14/2019 CLINICAL DATA:  Chest pain EXAM: CHEST - 2 VIEW COMPARISON:  02/07/2018 FINDINGS: Cardiomegaly status post median sternotomy. Left chest multi lead pacer. Mild diffuse interstitial pulmonary opacity. Small bilateral pleural effusions. Disc degenerative disease of the thoracic spine. IMPRESSION: Cardiomegaly with mild diffuse interstitial pulmonary opacity and small bilateral pleural effusions, findings consistent with congestive heart failure. No focal airspace  opacity. Electronically Signed   By: 04/09/2018 M.D.   On: 12/14/2019 14:56   DG Chest Port 1V same Day  Result Date: 12/15/2019 CLINICAL DATA:  Shortness of breath. EXAM: PORTABLE CHEST 1 VIEW COMPARISON:  12/14/2019 FINDINGS: The heart is enlarged but appears stable. Moderate central vascular congestion and mild interstitial edema along with small bilateral pleural effusions all suggesting CHF. No definite infiltrates or pneumothorax. IMPRESSION: CHF with small bilateral pleural effusions. Electronically Signed   By: 02/13/2020 M.D.   On: 12/15/2019 07:40   ECHOCARDIOGRAM COMPLETE  Result Date: 12/15/2019    ECHOCARDIOGRAM REPORT   Patient Name:   EMILIJA BOHMAN Marquard Date of Exam: 12/15/2019 Medical Rec #:  02/14/2020  Height:       62.0 in Accession #:    [id number]    Weight:       140.0 lb Date of Birth:  [birth year]    BSA:          1.643 m Patient Age:    73 years      BP:           110/79 mmHg Patient Gender: F             HR:           66 bpm. Exam Location:  Inpatient Procedure: 2D Echo and Intracardiac Opacification Agent Indications:    CHF-Acute Systolic 428.21 / I50.21  History:        Patient has prior history of Echocardiogram examinations, most                 recent 09/03/2017. CAD, Prior CABG; Risk Factors:Hypertension and                 Dyslipidemia. Thyroid disease. History of Afib.  Sonographer:    Leta Jungling RDCS Referring Phys: [id number] MICHAEL E NORINS IMPRESSIONS  1. Left ventricular ejection fraction, by estimation, is 15-20%. The left ventricle has severely decreased function. The left ventricle demonstrates global hypokinesis. The left ventricular internal cavity size was moderately dilated. Left ventricular diastolic parameters are consistent with Grade II diastolic dysfunction (pseudonormalization). Elevated left atrial pressure.  2. Right ventricular systolic function is severely reduced. The right ventricular size is mildly enlarged. There is normal pulmonary artery systolic  pressure. The estimated right ventricular systolic pressure is 33.4 mmHg.  3. Left atrial size was moderately dilated.  4. Right atrial size was mildly dilated.  5. Moderate pleural effusion in the left lateral region.  6. The mitral valve is grossly normal. Severe mitral valve regurgitation.  7. The tricuspid valve is abnormal. Tricuspid valve regurgitation is moderate to severe.  8. The aortic valve is tricuspid. Aortic valve regurgitation is not visualized. Mild aortic valve sclerosis is present, with no evidence of aortic valve stenosis.  9. The inferior vena cava is normal in size with <50% respiratory variability, suggesting right atrial pressure of 8 mmHg. Comparison(s): Changes from prior study are noted. EF now 15-20%. Severe MR. Moderate to severe TR. FINDINGS  Left Ventricle: Left ventricular ejection fraction, by estimation, is 15-20%. The left ventricle has severely decreased function. The left ventricle demonstrates global hypokinesis. Definity contrast agent was given IV to delineate the left ventricular endocardial borders. The left ventricular internal cavity size was moderately dilated. There is no left ventricular hypertrophy. Left ventricular diastolic parameters are consistent with Grade II diastolic dysfunction (pseudonormalization). The ratio of pulmonic flow to systemic flow (Qp/Qs ratio) is 1.30. Elevated left atrial pressure. Right Ventricle: The right ventricular size is mildly enlarged. No increase in right ventricular wall thickness. Right ventricular systolic function is severely reduced. There is normal pulmonary artery systolic pressure. The tricuspid regurgitant velocity is 2.52 m/s, and with an assumed right atrial pressure of 8 mmHg, the estimated right ventricular systolic pressure is 33.4 mmHg. Left Atrium: Left atrial size was moderately dilated. Right Atrium: Right atrial size was mildly dilated. Pericardium: There is no evidence of pericardial effusion. Mitral Valve: The  mitral valve is grossly normal. Severe mitral valve regurgitation. MV peak gradient, 8.3 mmHg. The mean mitral valve gradient is 2.0 mmHg. Tricuspid Valve: The tricuspid valve is abnormal. Tricuspid valve regurgitation is moderate to severe. No evidence of tricuspid stenosis.  Aortic Valve: The aortic valve is tricuspid. . There is mild thickening and mild calcification of the aortic valve. Aortic valve regurgitation is not visualized. Mild aortic valve sclerosis is present, with no evidence of aortic valve stenosis. There is mild thickening of the aortic valve. There is mild calcification of the aortic valve. Pulmonic Valve: The pulmonic valve was grossly normal. Pulmonic valve regurgitation is mild. No evidence of pulmonic stenosis. Aorta: The aortic root and ascending aorta are structurally normal, with no evidence of dilitation. Venous: The inferior vena cava is normal in size with less than 50% respiratory variability, suggesting right atrial pressure of 8 mmHg. The atrial septum is grossly normal. Additional Comments: A pacer wire is visualized in the right atrium and right ventricle. There is a moderate pleural effusion in the left lateral region.  LEFT VENTRICLE PLAX 2D LVIDd:         6.40 cm      Diastology LVIDs:         5.70 cm      LV e' lateral:   5.63 cm/s LV PW:         0.60 cm      LV E/e' lateral: 22.0 LV IVS:        0.90 cm      LV e' medial:    3.02 cm/s LVOT diam:     1.70 cm      LV E/e' medial:  41.1 LV SV:         23 LV SV Index:   14 LVOT Area:     2.27 cm  LV Volumes (MOD) LV vol d, MOD A2C: 161.0 ml LV vol d, MOD A4C: 176.0 ml LV vol s, MOD A2C: 116.0 ml LV vol s, MOD A4C: 159.0 ml LV SV MOD A2C:     45.0 ml LV SV MOD A4C:     176.0 ml LV SV MOD BP:      32.0 ml RIGHT VENTRICLE RV S prime:     7.90 cm/s RVOT diam:      2.10 cm TAPSE (M-mode): 1.3 cm LEFT ATRIUM             Index       RIGHT ATRIUM           Index LA diam:        5.20 cm 3.17 cm/m  RA Area:     19.20 cm LA Vol (A2C):    79.7 ml 48.51 ml/m RA Volume:   57.10 ml  34.76 ml/m LA Vol (A4C):   54.6 ml 33.24 ml/m LA Biplane Vol: 65.9 ml 40.11 ml/m  AORTIC VALVE             PULMONIC VALVE LVOT Vmax:   49.40 cm/s  RVOT Peak grad: 1 mmHg LVOT Vmean:  31.200 cm/s LVOT VTI:    0.100 m  AORTA Ao Root diam: 3.40 cm MITRAL VALVE                 TRICUSPID VALVE MV Area (PHT): 4.31 cm      TR Peak grad:   25.4 mmHg MV Peak grad:  8.3 mmHg      TR Mean grad:   13.0 mmHg MV Mean grad:  2.0 mmHg      TR Vmax:        252.00 cm/s MV Vmax:       1.44 m/s      TR Vmean:       174.0  cm/s MV Vmean:      66.5 cm/s MV Decel Time: 176 msec      SHUNTS MR Peak grad:    91.4 mmHg   Systemic VTI:  0.10 m MR Mean grad:    56.0 mmHg   Systemic Diam: 1.70 cm MR Vmax:         478.00 cm/s Pulmonic VTI:  0.087 m MR Vmean:        348.0 cm/s  Pulmonic Diam: 2.10 cm MR PISA:         1.01 cm    Qp/Qs:         1.32 MR PISA Eff ROA: 8 mm MR PISA Radius:  0.40 cm MV E velocity: 124.00 cm/s MV A velocity: 69.40 cm/s MV E/A ratio:  1.79 Lennie Odor MD Electronically signed by Lennie Odor MD Signature Date/Time: 12/15/2019/2:52:46 PM    Final

## 2019-12-16 NOTE — ED Notes (Signed)
Patient has continued to take off Nasal canula and desat down to the 70's; Pt has been asked numerous times to keep nasal canula in but continues to take it off; RN has replaced and patient's sats is 99%-Monqiue,RN

## 2019-12-16 NOTE — Progress Notes (Signed)
Orthopedic Tech Progress Note Patient Details:  [name] Nov 22, 1945 [id number]  Ortho Devices Type of Ortho Device: Radio broadcast assistant Ortho Device/Splint Location: RLE, LLE Ortho Device/Splint Interventions: Ordered, Application   Post Interventions Patient Tolerated: Well Instructions Provided: Care of device   Kerry Fort 12/16/2019, 4:59 PM

## 2019-12-16 NOTE — Progress Notes (Signed)
Patient ID: [name], female   DOB: [birth year], 74 y.o.   MRN: [id number]     Advanced Heart Failure Rounding Note  PCP-Cardiologist: Chrystie Nose, MD   Subjective:    Still short of breath with mild persistent tightness across her chest. Mildly elevated HS-TnI with no trend.    Objective:   Weight Range: 63.5 kg Body mass index is 25.6 kg/m.   Vital Signs:   Temp:  [99.1 F (37.3 C)-99.5 F (37.5 C)] 99.1 F (37.3 C) (08/10 0710) Pulse Rate:  [62-90] 90 (08/10 0710) Resp:  [15-25] 25 (08/10 0710) BP: (107-134)/(61-110) 118/73 (08/10 0710) SpO2:  [92 %-100 %] 97 % (08/10 0710) Weight:  [63.5 kg] 63.5 kg (08/10 0900)    Weight change: Filed Weights   12/14/19 1418 12/16/19 0900  Weight: 63.5 kg 63.5 kg    Intake/Output:  No intake or output data in the 24 hours ending 12/16/19 1002    Physical Exam    General:  Well appearing. No resp difficulty HEENT: Normal Neck: Supple. JVP 14+ cm. Carotids 2+ bilat; no bruits. No lymphadenopathy or thyromegaly appreciated. Cor: PMI Lateral. Regular rate & rhythm. No rubs, gallops.  2/6 HSM LLSB/apex. Lungs: Clear Abdomen: Soft, nontender, nondistended. No hepatosplenomegaly. No bruits or masses. Good bowel sounds. Extremities: No cyanosis, clubbing, rash. 1+ edema to knees bilaterally.  Neuro: Alert & orientedx3, cranial nerves grossly intact. moves all 4 extremities w/o difficulty. Affect pleasant   Telemetry   NSR, personally reviewed.   Labs    CBC Recent Labs    12/15/19 0522 12/16/19 0811  WBC 5.1 4.6  NEUTROABS 3.6 2.8  HGB 11.8* 11.8*  HCT 38.6 39.0  MCV 89.4 88.6  PLT 225 196   Basic Metabolic Panel Recent Labs    92/42/68 0522 12/16/19 0811  NA 135 134*  K 4.0 3.8  CL 95* 94*  CO2 29 27  GLUCOSE 366* 84  BUN 15 17  CREATININE 1.31* 1.15*  CALCIUM 8.7* 8.4*   Liver Function Tests Recent Labs    12/15/19 0522 12/16/19 0811  AST 28 30  ALT 18 18  ALKPHOS 65 63  BILITOT 0.5 0.7   PROT 6.5 6.1*  ALBUMIN 3.2* 3.0*   No results for input(s): LIPASE, AMYLASE in the last 72 hours. Cardiac Enzymes No results for input(s): CKTOTAL, CKMB, CKMBINDEX, TROPONINI in the last 72 hours.  BNP: BNP (last 3 results) Recent Labs    12/14/19 1355 12/14/19 2302  BNP 4,206.6* 3,317.2*    ProBNP (last 3 results) No results for input(s): PROBNP in the last 8760 hours.   D-Dimer Recent Labs    12/15/19 0522 12/16/19 0811  DDIMER 1.24* 0.96*   Hemoglobin A1C No results for input(s): HGBA1C in the last 72 hours. Fasting Lipid Panel No results for input(s): CHOL, HDL, LDLCALC, TRIG, CHOLHDL, LDLDIRECT in the last 72 hours. Thyroid Function Tests No results for input(s): TSH, T4TOTAL, T3FREE, THYROIDAB in the last 72 hours.  Invalid input(s): FREET3  Other results:   Imaging    ECHOCARDIOGRAM COMPLETE  Result Date: 12/15/2019    ECHOCARDIOGRAM REPORT   Patient Name:   [name] Date of Exam: 12/15/2019 Medical Rec #:  [id number]     Height:       62.0 in Accession #:    [id number]    Weight:       140.0 lb Date of Birth:  1946-05-04    BSA:  1.643 m Patient Age:    73 years      BP:           110/79 mmHg Patient Gender: F             HR:           66 bpm. Exam Location:  Inpatient Procedure: 2D Echo and Intracardiac Opacification Agent Indications:    CHF-Acute Systolic 428.21 / I50.21  History:        Patient has prior history of Echocardiogram examinations, most                 recent 09/03/2017. CAD, Prior CABG; Risk Factors:Hypertension and                 Dyslipidemia. Thyroid disease. History of Afib.  Sonographer:    Leta Jungling RDCS Referring Phys: [id number] MICHAEL E NORINS IMPRESSIONS  1. Left ventricular ejection fraction, by estimation, is 15-20%. The left ventricle has severely decreased function. The left ventricle demonstrates global hypokinesis. The left ventricular internal cavity size was moderately dilated. Left ventricular diastolic parameters are  consistent with Grade II diastolic dysfunction (pseudonormalization). Elevated left atrial pressure.  2. Right ventricular systolic function is severely reduced. The right ventricular size is mildly enlarged. There is normal pulmonary artery systolic pressure. The estimated right ventricular systolic pressure is 33.4 mmHg.  3. Left atrial size was moderately dilated.  4. Right atrial size was mildly dilated.  5. Moderate pleural effusion in the left lateral region.  6. The mitral valve is grossly normal. Severe mitral valve regurgitation.  7. The tricuspid valve is abnormal. Tricuspid valve regurgitation is moderate to severe.  8. The aortic valve is tricuspid. Aortic valve regurgitation is not visualized. Mild aortic valve sclerosis is present, with no evidence of aortic valve stenosis.  9. The inferior vena cava is normal in size with <50% respiratory variability, suggesting right atrial pressure of 8 mmHg. Comparison(s): Changes from prior study are noted. EF now 15-20%. Severe MR. Moderate to severe TR. FINDINGS  Left Ventricle: Left ventricular ejection fraction, by estimation, is 15-20%. The left ventricle has severely decreased function. The left ventricle demonstrates global hypokinesis. Definity contrast agent was given IV to delineate the left ventricular endocardial borders. The left ventricular internal cavity size was moderately dilated. There is no left ventricular hypertrophy. Left ventricular diastolic parameters are consistent with Grade II diastolic dysfunction (pseudonormalization). The ratio of pulmonic flow to systemic flow (Qp/Qs ratio) is 1.30. Elevated left atrial pressure. Right Ventricle: The right ventricular size is mildly enlarged. No increase in right ventricular wall thickness. Right ventricular systolic function is severely reduced. There is normal pulmonary artery systolic pressure. The tricuspid regurgitant velocity is 2.52 m/s, and with an assumed right atrial pressure of 8 mmHg,  the estimated right ventricular systolic pressure is 33.4 mmHg. Left Atrium: Left atrial size was moderately dilated. Right Atrium: Right atrial size was mildly dilated. Pericardium: There is no evidence of pericardial effusion. Mitral Valve: The mitral valve is grossly normal. Severe mitral valve regurgitation. MV peak gradient, 8.3 mmHg. The mean mitral valve gradient is 2.0 mmHg. Tricuspid Valve: The tricuspid valve is abnormal. Tricuspid valve regurgitation is moderate to severe. No evidence of tricuspid stenosis. Aortic Valve: The aortic valve is tricuspid. . There is mild thickening and mild calcification of the aortic valve. Aortic valve regurgitation is not visualized. Mild aortic valve sclerosis is present, with no evidence of aortic valve stenosis. There is mild thickening of the aortic  valve. There is mild calcification of the aortic valve. Pulmonic Valve: The pulmonic valve was grossly normal. Pulmonic valve regurgitation is mild. No evidence of pulmonic stenosis. Aorta: The aortic root and ascending aorta are structurally normal, with no evidence of dilitation. Venous: The inferior vena cava is normal in size with less than 50% respiratory variability, suggesting right atrial pressure of 8 mmHg. The atrial septum is grossly normal. Additional Comments: A pacer wire is visualized in the right atrium and right ventricle. There is a moderate pleural effusion in the left lateral region.  LEFT VENTRICLE PLAX 2D LVIDd:         6.40 cm      Diastology LVIDs:         5.70 cm      LV e' lateral:   5.63 cm/s LV PW:         0.60 cm      LV E/e' lateral: 22.0 LV IVS:        0.90 cm      LV e' medial:    3.02 cm/s LVOT diam:     1.70 cm      LV E/e' medial:  41.1 LV SV:         23 LV SV Index:   14 LVOT Area:     2.27 cm  LV Volumes (MOD) LV vol d, MOD A2C: 161.0 ml LV vol d, MOD A4C: 176.0 ml LV vol s, MOD A2C: 116.0 ml LV vol s, MOD A4C: 159.0 ml LV SV MOD A2C:     45.0 ml LV SV MOD A4C:     176.0 ml LV SV MOD  BP:      32.0 ml RIGHT VENTRICLE RV S prime:     7.90 cm/s RVOT diam:      2.10 cm TAPSE (M-mode): 1.3 cm LEFT ATRIUM             Index       RIGHT ATRIUM           Index LA diam:        5.20 cm 3.17 cm/m  RA Area:     19.20 cm LA Vol (A2C):   79.7 ml 48.51 ml/m RA Volume:   57.10 ml  34.76 ml/m LA Vol (A4C):   54.6 ml 33.24 ml/m LA Biplane Vol: 65.9 ml 40.11 ml/m  AORTIC VALVE             PULMONIC VALVE LVOT Vmax:   49.40 cm/s  RVOT Peak grad: 1 mmHg LVOT Vmean:  31.200 cm/s LVOT VTI:    0.100 m  AORTA Ao Root diam: 3.40 cm MITRAL VALVE                 TRICUSPID VALVE MV Area (PHT): 4.31 cm      TR Peak grad:   25.4 mmHg MV Peak grad:  8.3 mmHg      TR Mean grad:   13.0 mmHg MV Mean grad:  2.0 mmHg      TR Vmax:        252.00 cm/s MV Vmax:       1.44 m/s      TR Vmean:       174.0 cm/s MV Vmean:      66.5 cm/s MV Decel Time: 176 msec      SHUNTS MR Peak grad:    91.4 mmHg   Systemic VTI:  0.10 m MR Mean grad:    56.0 mmHg  Systemic Diam: 1.70 cm MR Vmax:         478.00 cm/s Pulmonic VTI:  0.087 m MR Vmean:        348.0 cm/s  Pulmonic Diam: 2.10 cm MR PISA:         1.01 cm    Qp/Qs:         1.32 MR PISA Eff ROA: 8 mm MR PISA Radius:  0.40 cm MV E velocity: 124.00 cm/s MV A velocity: 69.40 cm/s MV E/A ratio:  1.79 Lennie OdorWesley O'Neal MD Electronically signed by Lennie OdorWesley O'Neal MD Signature Date/Time: 12/15/2019/2:52:46 PM    Final       Medications:     Scheduled Medications: . amiodarone  100 mg Oral Daily  . cholecalciferol  1,000 Units Oral Daily  . ezetimibe  10 mg Oral QHS  . feeding supplement (ENSURE ENLIVE)  237 mL Oral Daily  . furosemide  80 mg Intravenous BID  . insulin aspart  0-15 Units Subcutaneous TID WC  . insulin aspart  3 Units Subcutaneous TID WC  . insulin glargine  15 Units Subcutaneous Daily  . levothyroxine  50 mcg Oral Q0600  . losartan  25 mg Oral Daily  . pantoprazole  40 mg Oral Q1200  . simvastatin  40 mg Oral QHS  . spironolactone  25 mg Oral Daily  . venlafaxine XR   150 mg Oral QHS  . vitamin B-12  1,000 mcg Oral Daily     Infusions: . sodium chloride Stopped (12/16/19 0640)  . cefTRIAXone (ROCEPHIN)  IV       PRN Medications:  acetaminophen, nitroGLYCERIN, ondansetron (ZOFRAN) IV, traMADol    Assessment/Plan   1. COVID-19 infection: Only got 1 dose of Pfizer vaccine, missed 2nd dose. Suspect main issues here is CHF, not COVID PNA.  2. Acute on chronic systolic CHF: History of ischemic cardiomyopathy.  MDT ICD.  Echo this admission with fall in EF to 15-20%, global hypokinesis, moderate LV dilation, severely decreased RV function, severe MR, moderate-severe TR.  On exam, she is significantly volume overloaded.  - Continue losartan 25 mg daily (did not tolerate Entresto in the past).  - Continus spironolactone 25 mg daily.  - Increase Lasix to 80 mg IV bid.  - Will need to try to get strict I/Os and daily weights.  - Patient has LBBB but probably not wide enough (129 msec) to benefit much from CRT.  3. CAD: CABG x 4 2008. NSTEMI 05/2017>>LHC w/ occluded SVG-PDA and serial 70% and 90% stenoses in the proximal to mid RCA, treated w/ DES x 2 to RCA.  HS troponin this admit low level and flat, 23>>23>>36. Chest tightness, and EF is lower on echo this admission.  - Continue statin, check lipids.  - ASA 81 - Holding apixaban, will start IV heparin.  - She will need right/left heart cath with chest pain and worsening LV function, ?tomorrow if volume looks better. Discussed risks/benefits with patient and she agrees. 4. UTI: Treating with ceftriaxone.  5. Type 2 diabetes: Would benefit from dapagliflozin eventually.  6. 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  7. Atrial fibrillation: Paroxysmal.  NSR today.  She is on amiodarone.  - Continue amiodarone.  - Hold Eliquis for cath and cover with heparin gtt.   Length of Stay: 1  Marca Anconaalton Thanh Pomerleau, MD  12/16/2019, 10:02 AM  Advanced Heart Failure Team Pager [phone]  (M-F; 7a - 4p)  Please contact [facility] for night-coverage after  hours (4p -7a ) and weekends on [email or web address]

## 2019-12-17 ENCOUNTER — Inpatient Hospital Stay (HOSPITAL_COMMUNITY): Payer: Medicare Other

## 2019-12-17 ENCOUNTER — Encounter (HOSPITAL_COMMUNITY)
Admission: EM | Disposition: A | Discharge status: Hospice | Payer: Self-pay | Source: Home / Self Care | Attending: Internal Medicine

## 2019-12-17 DIAGNOSIS — I5043 Acute on chronic combined systolic (congestive) and diastolic (congestive) heart failure: Secondary | ICD-10-CM | POA: Diagnosis not present

## 2019-12-17 DIAGNOSIS — I509 Heart failure, unspecified: Secondary | ICD-10-CM | POA: Diagnosis not present

## 2019-12-17 LAB — COMPREHENSIVE METABOLIC PANEL
ALT: 16 U/L (ref 0–44)
AST: 31 U/L (ref 15–41)
Albumin: 2.8 g/dL — ABNORMAL LOW (ref 3.5–5.0)
Alkaline Phosphatase: 61 U/L (ref 38–126)
Anion gap: 11 (ref 5–15)
BUN: 21 mg/dL (ref 8–23)
CO2: 28 mmol/L (ref 22–32)
Calcium: 8 mg/dL — ABNORMAL LOW (ref 8.9–10.3)
Chloride: 95 mmol/L — ABNORMAL LOW (ref 98–111)
Creatinine, Ser: 1.31 mg/dL — ABNORMAL HIGH (ref 0.44–1.00)
GFR calc Af Amer: 46 mL/min — ABNORMAL LOW (ref >60)
GFR calc non Af Amer: 40 mL/min — ABNORMAL LOW (ref >60)
Glucose, Bld: 92 mg/dL (ref 70–99)
Potassium: 3.9 mmol/L (ref 3.5–5.1)
Sodium: 134 mmol/L — ABNORMAL LOW (ref 135–145)
Total Bilirubin: 0.4 mg/dL (ref 0.3–1.2)
Total Protein: 5.9 g/dL — ABNORMAL LOW (ref 6.5–8.1)

## 2019-12-17 LAB — GLUCOSE, CAPILLARY
Glucose-Capillary: 103 mg/dL — ABNORMAL HIGH (ref 70–99)
Glucose-Capillary: 139 mg/dL — ABNORMAL HIGH (ref 70–99)
Glucose-Capillary: 143 mg/dL — ABNORMAL HIGH (ref 70–99)
Glucose-Capillary: 145 mg/dL — ABNORMAL HIGH (ref 70–99)
Glucose-Capillary: 67 mg/dL — ABNORMAL LOW (ref 70–99)
Glucose-Capillary: 99 mg/dL (ref 70–99)

## 2019-12-17 LAB — CBC WITH DIFFERENTIAL/PLATELET
Abs Immature Granulocytes: 0.02 10*3/uL (ref 0.00–0.07)
Basophils Absolute: 0 10*3/uL (ref 0.0–0.1)
Basophils Relative: 1 %
Eosinophils Absolute: 0 10*3/uL (ref 0.0–0.5)
Eosinophils Relative: 0 %
HCT: 36.1 % (ref 36.0–46.0)
Hemoglobin: 11 g/dL — ABNORMAL LOW (ref 12.0–15.0)
Immature Granulocytes: 1 %
Lymphocytes Relative: 21 %
Lymphs Abs: 0.9 10*3/uL (ref 0.7–4.0)
MCH: 26.9 pg (ref 26.0–34.0)
MCHC: 30.5 g/dL (ref 30.0–36.0)
MCV: 88.3 fL (ref 80.0–100.0)
Monocytes Absolute: 0.5 10*3/uL (ref 0.1–1.0)
Monocytes Relative: 12 %
Neutro Abs: 2.8 10*3/uL (ref 1.7–7.7)
Neutrophils Relative %: 65 %
Platelets: 190 10*3/uL (ref 150–400)
RBC: 4.09 MIL/uL (ref 3.87–5.11)
RDW: 15.8 % — ABNORMAL HIGH (ref 11.5–15.5)
WBC: 4.2 10*3/uL (ref 4.0–10.5)
nRBC: 0 % (ref 0.0–0.2)

## 2019-12-17 LAB — MAGNESIUM: Magnesium: 1.7 mg/dL (ref 1.7–2.4)

## 2019-12-17 LAB — BRAIN NATRIURETIC PEPTIDE: B Natriuretic Peptide: 1726.9 pg/mL — ABNORMAL HIGH (ref 0.0–100.0)

## 2019-12-17 LAB — URINALYSIS, ROUTINE W REFLEX MICROSCOPIC
Bilirubin Urine: NEGATIVE
Glucose, UA: NEGATIVE mg/dL
Ketones, ur: NEGATIVE mg/dL
Leukocytes,Ua: NEGATIVE
Nitrite: NEGATIVE
Protein, ur: 30 mg/dL — AB
RBC / HPF: >50 RBC/hpf — ABNORMAL HIGH (ref 0–5)
Specific Gravity, Urine: 1.01 (ref 1.005–1.030)
pH: 6 (ref 5.0–8.0)

## 2019-12-17 LAB — PROCALCITONIN: Procalcitonin: 0.18 ng/mL

## 2019-12-17 LAB — LIPID PANEL
Cholesterol: 56 mg/dL (ref 0–200)
HDL: 26 mg/dL — ABNORMAL LOW (ref >40)
LDL Cholesterol: 21 mg/dL (ref 0–99)
Total CHOL/HDL Ratio: 2.2 RATIO
Triglycerides: 44 mg/dL (ref <150)
VLDL: 9 mg/dL (ref 0–40)

## 2019-12-17 LAB — D-DIMER, QUANTITATIVE: D-Dimer, Quant: 1 ug/mL-FEU — ABNORMAL HIGH (ref 0.00–0.50)

## 2019-12-17 LAB — APTT
aPTT: 148 seconds — ABNORMAL HIGH (ref 24–36)
aPTT: >200 seconds (ref 24–36)

## 2019-12-17 LAB — LACTIC ACID, PLASMA: Lactic Acid, Venous: 1.2 mmol/L (ref 0.5–1.9)

## 2019-12-17 LAB — FERRITIN: Ferritin: 93 ng/mL (ref 11–307)

## 2019-12-17 LAB — C-REACTIVE PROTEIN: CRP: 1.2 mg/dL — ABNORMAL HIGH (ref <1.0)

## 2019-12-17 LAB — HEPARIN LEVEL (UNFRACTIONATED): Heparin Unfractionated: >2.2 IU/mL — ABNORMAL HIGH (ref 0.30–0.70)

## 2019-12-17 SURGERY — RIGHT/LEFT HEART CATH AND CORONARY/GRAFT ANGIOGRAPHY
Anesthesia: LOCAL

## 2019-12-17 MED ORDER — SODIUM CHLORIDE 0.9 % IV SOLN: INTRAVENOUS | Status: DC

## 2019-12-17 MED ORDER — GUAIFENESIN-DM 100-10 MG/5ML PO SYRP: 10.0000 mL | ORAL_SOLUTION | ORAL | Status: DC | PRN

## 2019-12-17 MED ORDER — POTASSIUM CHLORIDE CRYS ER 20 MEQ PO TBCR
40.0000 meq | EXTENDED_RELEASE_TABLET | Freq: Once | ORAL | Status: AC
  Administered 2019-12-17: 40 meq via ORAL
  Filled 2019-12-17: qty 2

## 2019-12-17 MED ORDER — ASPIRIN 81 MG PO CHEW
81.0000 mg | CHEWABLE_TABLET | ORAL | Status: AC
  Administered 2019-12-18: 81 mg via ORAL
  Filled 2019-12-17: qty 1

## 2019-12-17 MED ORDER — ASCORBIC ACID 500 MG PO TABS
500.0000 mg | ORAL_TABLET | Freq: Every day | ORAL | Status: DC
  Administered 2019-12-17 – 2019-12-23 (×7): 500 mg via ORAL
  Filled 2019-12-17 (×8): qty 1

## 2019-12-17 MED ORDER — ASPIRIN 81 MG PO CHEW: 81.0000 mg | CHEWABLE_TABLET | ORAL | Status: DC

## 2019-12-17 MED ORDER — MAGNESIUM SULFATE 2 GM/50ML IV SOLN
2.0000 g | Freq: Once | INTRAVENOUS | Status: AC
  Administered 2019-12-17: 2 g via INTRAVENOUS
  Filled 2019-12-17: qty 50

## 2019-12-17 MED ORDER — DEXTROSE 50 % IV SOLN
INTRAVENOUS | Status: AC
  Administered 2019-12-17: 25 mL
  Filled 2019-12-17: qty 50

## 2019-12-17 MED ORDER — ZINC SULFATE 220 (50 ZN) MG PO CAPS
220.0000 mg | ORAL_CAPSULE | Freq: Every day | ORAL | Status: DC
  Administered 2019-12-17 – 2019-12-23 (×7): 220 mg via ORAL
  Filled 2019-12-17 (×9): qty 1

## 2019-12-17 MED ORDER — SODIUM CHLORIDE 0.9% FLUSH: 3.0000 mL | INTRAVENOUS | Status: DC | PRN

## 2019-12-17 MED ORDER — ALBUTEROL SULFATE HFA 108 (90 BASE) MCG/ACT IN AERS
2.0000 | INHALATION_SPRAY | Freq: Four times a day (QID) | RESPIRATORY_TRACT | Status: DC
  Administered 2019-12-17 – 2019-12-18 (×4): 2 via RESPIRATORY_TRACT
  Filled 2019-12-17: qty 6.7

## 2019-12-17 MED ORDER — DEXTROSE 10 % IV SOLN: INTRAVENOUS | Status: DC

## 2019-12-17 MED ORDER — SODIUM CHLORIDE 0.9 % IV SOLN: 250.0000 mL | INTRAVENOUS | Status: DC | PRN

## 2019-12-17 MED ORDER — SODIUM CHLORIDE 0.9% FLUSH
3.0000 mL | Freq: Two times a day (BID) | INTRAVENOUS | Status: DC
  Administered 2019-12-17 – 2019-12-20 (×4): 3 mL via INTRAVENOUS

## 2019-12-17 MED ORDER — METOLAZONE 5 MG PO TABS
2.5000 mg | ORAL_TABLET | Freq: Once | ORAL | Status: AC
  Administered 2019-12-17: 2.5 mg via ORAL
  Filled 2019-12-17: qty 1

## 2019-12-17 NOTE — Progress Notes (Signed)
Patient began running a fever when transferred to 6E, temperature of 100.7 oral, BP 104/69, HR 80, RR 23, O2 100% on 3L [place]- MEWS went to yellow. Loney Loh, MD paged @ 2200. Vital signs rechecked @ 2300- Temperature of 102.9 rectal, BP 111/67, HR 82, RR 20, O2 99% on 3L [place]. Loney Loh, MD paged. Awaiting new orders.   Bari Edward, RN

## 2019-12-17 NOTE — Progress Notes (Addendum)
Patient ID: [name], female   DOB: April 21, 1946, 74 y.o.   MRN: [id number]     Advanced Heart Failure Rounding Note  PCP-Cardiologist: Chrystie Nose, MD   Subjective:    Still with dyspnea.  I/Os not well-recorded.    She had a fever to 102.9 this morning, cath postponed.  CXR with pulmonary edema, possible LLL infiltrate.  Ferritin level normal, CRP mildly elevated at 1.2.  Creatinine stable at 1.3.  BP stable.    Objective:   Weight Range: 66.2 kg Body mass index is 26.69 kg/m.   Vital Signs:   Temp:  [97.8 F (36.6 C)-102.9 F (39.4 C)] 98.2 F (36.8 C) (08/11 1106) Pulse Rate:  [72-82] 74 (08/11 1106) Resp:  [15-25] 25 (08/11 1106) BP: (104-118)/(65-70) 109/65 (08/11 1106) SpO2:  [96 %-100 %] 98 % (08/11 1106) Weight:  [66.2 kg] 66.2 kg (08/11 0404)    Weight change: Filed Weights   12/14/19 1418 12/16/19 0900 12/17/19 0404  Weight: 63.5 kg 63.5 kg 66.2 kg    Intake/Output:   Intake/Output Summary (Last 24 hours) at 12/17/2019 1422 Last data filed at 12/17/2019 1400 Gross per 24 hour  Intake 460.17 ml  Output 1700 ml  Net -1239.83 ml      Physical Exam    General: NAD Neck: JVP 14+, no thyromegaly or thyroid nodule.  Lungs: Crackles at bases.  CV: Lateral PMI.  Heart regular S1/S2, no S3/S4, 2/6 HSM LLSB/apex.  No peripheral edema.   Abdomen: Soft, nontender, no hepatosplenomegaly, no distention.  Skin: Intact without lesions or rashes.  Neurologic: Alert and oriented x 3.  Psych: Normal affect. Extremities: No clubbing or cyanosis.  HEENT: Normal.    Telemetry   NSR, personally reviewed.   Labs    CBC Recent Labs    12/16/19 0811 12/17/19 0312  WBC 4.6 4.2  NEUTROABS 2.8 2.8  HGB 11.8* 11.0*  HCT 39.0 36.1  MCV 88.6 88.3  PLT 196 190   Basic Metabolic Panel Recent Labs    67/34/19 0811 12/17/19 0312  NA 134* 134*  K 3.8 3.9  CL 94* 95*  CO2 27 28  GLUCOSE 84 92  BUN 17 21  CREATININE 1.15* 1.31*  CALCIUM 8.4* 8.0*    MG  --  1.7   Liver Function Tests Recent Labs    12/16/19 0811 12/17/19 0312  AST 30 31  ALT 18 16  ALKPHOS 63 61  BILITOT 0.7 0.4  PROT 6.1* 5.9*  ALBUMIN 3.0* 2.8*   No results for input(s): LIPASE, AMYLASE in the last 72 hours. Cardiac Enzymes No results for input(s): CKTOTAL, CKMB, CKMBINDEX, TROPONINI in the last 72 hours.  BNP: BNP (last 3 results) Recent Labs    12/14/19 1355 12/14/19 2302 12/17/19 0255  BNP 4,206.6* 3,317.2* 1,726.9*    ProBNP (last 3 results) No results for input(s): PROBNP in the last 8760 hours.   D-Dimer Recent Labs    12/16/19 0811 12/17/19 0312  DDIMER 0.96* 1.00*   Hemoglobin A1C No results for input(s): HGBA1C in the last 72 hours. Fasting Lipid Panel Recent Labs    12/17/19 0312  CHOL 56  HDL 26*  LDLCALC 21  TRIG 44  CHOLHDL 2.2   Thyroid Function Tests No results for input(s): TSH, T4TOTAL, T3FREE, THYROIDAB in the last 72 hours.  Invalid input(s): FREET3  Other results:   Imaging    DG CHEST PORT 1 VIEW  Result Date: 12/17/2019 CLINICAL DATA:  Congestive heart failure. EXAM:  PORTABLE CHEST 1 VIEW COMPARISON:  12/15/2019 FINDINGS: Patient is rotated to the left. Cardiomegaly stable. Diffuse interstitial infiltrates are again seen, consistent with interstitial edema. Bilateral pleural effusions are seen, likely increased on the left, with increased left lower lobe atelectasis versus consolidation. Prior CABG again noted. Single lead transvenous pacemaker remains in appropriate position. IMPRESSION: Congestive heart failure, with bilateral pleural effusions, likely increased on the left. Increased left lower lobe atelectasis versus consolidation. Electronically Signed   By: Danae Orleans M.D.   On: 12/17/2019 09:27     Medications:     Scheduled Medications: . albuterol  2 puff Inhalation Q6H  . amiodarone  100 mg Oral Daily  . vitamin C  500 mg Oral Daily  . aspirin  81 mg Oral Daily  . cholecalciferol   1,000 Units Oral Daily  . ezetimibe  10 mg Oral QHS  . feeding supplement (ENSURE ENLIVE)  237 mL Oral Daily  . furosemide  80 mg Intravenous BID  . insulin aspart  0-15 Units Subcutaneous TID WC  . levothyroxine  50 mcg Oral Q0600  . losartan  25 mg Oral Daily  . metolazone  2.5 mg Oral Once  . pantoprazole  40 mg Oral Q1200  . potassium chloride  40 mEq Oral Once  . simvastatin  40 mg Oral QHS  . sodium chloride flush  3 mL Intravenous Q12H  . spironolactone  25 mg Oral Daily  . venlafaxine XR  150 mg Oral QHS  . vitamin B-12  1,000 mcg Oral Daily  . zinc sulfate  220 mg Oral Daily    Infusions: . heparin 700 Units/hr (12/17/19 0810)  . magnesium sulfate bolus IVPB      PRN Medications: acetaminophen, guaiFENesin-dextromethorphan, nitroGLYCERIN, ondansetron (ZOFRAN) IV, traMADol    Assessment/Plan   1. COVID-19 infection: Only got 1 dose of Pfizer vaccine, missed 2nd dose. Suspect main issues here is CHF, not COVID PNA. However, she had a fever to 102.9 this morning.  CXR with pulmonary edema, possible LLL infiltrate.  2. Acute on chronic systolic CHF: History of ischemic cardiomyopathy.  MDT ICD.  Echo this admission with fall in EF to 15-20%, global hypokinesis, moderate LV dilation, severely decreased RV function, severe MR, moderate-severe TR.  On exam, she is volume overloaded still. Difficult to tell how much diuresis she has had, I/Os not fully recorded.   - Continue losartan 25 mg daily (did not tolerate Entresto in the past).  - Continus spironolactone 25 mg daily.  - Continue Lasix 80 mg IV bid, will give a dose of metolazone 2.5 x 1 today. Creatinine mildly higher at 1.3, follow closely.  - Will need to try to get strict I/Os and daily weights.  - Patient has LBBB but probably not wide enough (129 msec) to benefit much from CRT.  3. CAD: CABG x 4 2008. NSTEMI 05/2017>>LHC w/ occluded SVG-PDA and serial 70% and 90% stenoses in the proximal to mid RCA, treated w/ DES x  2 to RCA.  HS troponin this admit low level and flat, 23>>23>>36. Chest tightness on and off, and EF is lower on echo this admission.  - Continue statin, good LDL 21.  - ASA 81 - Holding apixaban, on IV heparin.  - She will need right/left heart cath with chest pain and worsening LV function, cath cancelled today due to high fever.  Possibly tomorrow or Friday depending on clinical course. Discussed risks/benefits with patient and she agrees. 4. UTI: Treating with ceftriaxone.  5.  Type 2 diabetes: Would benefit from dapagliflozin eventually.  6. 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  7. Atrial fibrillation: Paroxysmal.  NSR today.  She is on amiodarone.  - Continue amiodarone.  - Hold Eliquis for cath and cover with heparin gtt.   Length of Stay: 2  Marca Ancona, MD  12/17/2019, 2:22 PM  Advanced Heart Failure Team Pager [phone] (M-F; 7a - 4p)  Please contact [facility] for night-coverage after hours (4p -7a ) and weekends on [email or web address]

## 2019-12-17 NOTE — Progress Notes (Signed)
PROGRESS NOTE                                                                                                                                                                                                             Patient Demographics:    [name], is a 74 y.o. female, DOB - Nov 28, 1945, ZOX:[id number]  Outpatient Primary MD for the patient is Georgianne Fick, MD   Admit date - 12/14/2019   LOS - 2  Chief Complaint  Patient presents with  . Chest Pain       Brief Narrative: Patient is a 74 y.o. female with PMHx of chronic systolic heart failure-s/p AICD in place, PAF on Eliquis, DM-2 who is partially vaccinated against Covid ( received 1 dose of Pfizer vaccine sometime in June) presented with 4-5-day history of worsening shortness of breath, leg swelling, chest pressure radiating to the neck.  She was thought to have decompensated systolic heart failure-and subsequently admitted to the hospitalist service.    She also tested positive for Covid-but thought to be mostly asymptomatic as her presenting symptoms are most consistent with decompensated heart failure.  Significant Events: 8/8>> Admit to [facility] for chest pressure/decompensated heart failure  Significant studies: 12/14/19>>Chest x-ray: Cardiomegaly with mild diffuse interstitial pulmonary opacities/bilateral pleural effusions. 12/15/19>> chest x-ray: CHF with small bilateral pleural effusions. 12/15/19>> echo: EF 15-20%, grade 2 diastolic dysfunction, RV systolic function is severely reduced.  Severe MR, moderate to severe TR 09/03/17>> Echo: EF 25-30%, grade 1 diastolic dysfunction, RV systolic function was mildly reduced  COVID-19 medications: None  Antibiotics: Ciprofloxacin: 8/8>> 8/9 Rocephin: 8/10>> x2 days  Microbiology data: None  Procedures: None  Consults: Advanced heart failure team.  DVT prophylaxis: Eliquis>>IV heparin in anticipation of  LCH/RHC    Subjective:   No nausea no vomiting.  No fever no chills.  Had fever last night.   Assessment  & Plan :   Acute hypoxic respiratory failure secondary to decompensated systolic heart failure: Some improvement in the volume status-however still significantly overloaded.  Although she tested positive for COVID-19-clinical features are more consistent with decompensated heart failure.  Inflammatory markers are not significantly elevated as well.  Remains on diuretic regimen per advanced CHF team.  Cardiology planning LHC/RHC currently on hold due to fever  Chest pain-history of CAD: Hardly any chest pain this morning-had more  chest pain yesterday-radiating to her neck.  Has history of CAD s/p CABG in subsequent PCI to RCA in 2019.  Troponins are essentially flat and very minimally elevated.  Cardiology planning [facility] tomorrow.    DM-2 (A1c 9.3 on 10/29/2019): She ran out of her insulin a few days back-CBG borderline low this morning-decrease Lantus to 10 units-continue 3 units of NovoLog with meals and SSI.  Follow and optimize.    Recent Labs    12/17/19 0852 12/17/19 1108 12/17/19 1701  GLUCAP 145* 103* 99   PAF: Continue telemetry monitoring-on amiodarone/Coreg for rate control-anticoagulated with Eliquis  HTN: Controlled-continue losartan and Coreg  CKD stage IIIa: Creatinine close to baseline-follow.  UTI: Complains of dysuria-UA somewhat suggestive of UTI-cultures not sent-on Cipro started on admission-switch to Rocephin-x2 additional days  Hypothyroidism: Continue with Synthroid  COVID-19 infection: As noted above-current clinical situation is more consistent with decompensated heart failure-inflammatory markers are not significantly elevated-plan is to observe-diurese-if hypoxia does not improve with diuresis-or worsen significantly-we can then contemplate treating with steroids/remdesivir. Overnight on 12/17/2019 patient had fever with temp of 102.  Chest x-ray no  significant difference and clinically patient is about the same from what was described therefore we will continue to monitor for now.   COVID-19 Labs: Recent Labs    12/15/19 0522 12/16/19 0811 12/17/19 0312  DDIMER 1.24* 0.96* 1.00*  FERRITIN 72 78 93  CRP 1.6* 1.4* 1.2*  32123     Component Value Date/Time   BNP 1,726.9 (H) 12/17/2019 0255    Recent Labs  Lab 12/15/19 0522 12/17/19 0312  PROCALCITON <0.10 0.18    Lab Results  Component Value Date   SARSCOV2NAA POSITIVE (A) 12/14/2019   SARSCOV2NAA NEGATIVE 10/29/2019     ABG:    Component Value Date/Time   PHART 7.514 (H) 12/15/2019 0416   PCO2ART 34.5 12/15/2019 0416   PO2ART 105 12/15/2019 0416   HCO3 27.7 12/15/2019 0416   TCO2 29 12/15/2019 0416   ACIDBASEDEF 2.0 02/13/2017 2359   O2SAT 99.0 12/15/2019 0416    Vent Settings: N/A   Condition - Stable  Family Communication  :  Daughter-Cindi ( [phone]) updated over the phone 8/10  Code Status :  Full Code  Diet :  Diet Order            Diet heart healthy/carb modified Room service appropriate? Yes; Fluid consistency: Thin  Diet effective now                  Disposition Plan  :   Status is: Inpatient  The patient will require care spanning > 2 midnights and should be moved to inpatient because: Hypoxemia requiring IV Lasix and oxygen supplementation  Dispo: The patient is from: Home              Anticipated d/c is to: Home              Anticipated d/c date is: 2 days              Patient currently is not medically stable to d/c.   Barriers to discharge: Hypoxia requiring O2 supplementation/IV Lasix  Antimicorbials  :    Anti-infectives (From admission, onward)   Start     Dose/Rate Route Frequency Ordered Stop   12/16/19 0600  cefTRIAXone (ROCEPHIN) 1 g in sodium chloride 0.9 % 100 mL IVPB  Status:  Discontinued        1 g 200 mL/hr over 30 Minutes Intravenous Every 24 hours  12/15/19 1159 12/17/19 1005   12/15/19 0015   ciprofloxacin (CIPRO) tablet 250 mg  Status:  Discontinued        250 mg Oral 2 times daily 12/15/19 0012 12/15/19 1158      Inpatient Medications  Scheduled Meds: . albuterol  2 puff Inhalation Q6H  . amiodarone  100 mg Oral Daily  . vitamin C  500 mg Oral Daily  . aspirin  81 mg Oral Daily  . cholecalciferol  1,000 Units Oral Daily  . ezetimibe  10 mg Oral QHS  . feeding supplement (ENSURE ENLIVE)  237 mL Oral Daily  . furosemide  80 mg Intravenous BID  . insulin aspart  0-15 Units Subcutaneous TID WC  . levothyroxine  50 mcg Oral Q0600  . losartan  25 mg Oral Daily  . pantoprazole  40 mg Oral Q1200  . simvastatin  40 mg Oral QHS  . sodium chloride flush  3 mL Intravenous Q12H  . sodium chloride flush  3 mL Intravenous Q12H  . spironolactone  25 mg Oral Daily  . venlafaxine XR  150 mg Oral QHS  . vitamin B-12  1,000 mcg Oral Daily  . zinc sulfate  220 mg Oral Daily   Continuous Infusions:  PRN Meds:.acetaminophen, guaiFENesin-dextromethorphan, nitroGLYCERIN, ondansetron (ZOFRAN) IV, traMADol   Time Spent in minutes  35   See all Orders from today for further details   Lynden Oxford M.D on 12/17/2019 at 8:18 PM  To page go to [email or web address] - use universal password  [facility] -  Office  [phone]    Objective:   Vitals:   12/17/19 1106 12/17/19 1206 12/17/19 1306 12/17/19 1506  BP: 109/65 119/69 117/68 112/71  Pulse: 74  78   Resp: (!) 25 20 19  (!) 22  Temp: 98.2 F (36.8 C)   98.8 F (37.1 C)  TempSrc: Oral   Oral  SpO2: 98%  99% 100%  Weight:      Height:        Wt Readings from Last 3 Encounters:  12/17/19 66.2 kg  10/29/19 64.4 kg  02/20/19 60.5 kg     Intake/Output Summary (Last 24 hours) at 12/17/2019 2018 Last data filed at 12/17/2019 1900 Gross per 24 hour  Intake 714.17 ml  Output 1800 ml  Net -1085.83 ml     Physical Exam Gen Exam:Alert awake-not in any distress HEENT:atraumatic, normocephalic Chest: B/L clear to  auscultation anteriorly CVS:S1S2 regular Abdomen:soft non tender, non distended Extremities:++ edema Neurology: Non focal Skin: no rash   Data Review:    CBC Recent Labs  Lab 12/14/19 1355 12/15/19 0416 12/15/19 0522 12/16/19 0811 12/17/19 0312  WBC 4.9  --  5.1 4.6 4.2  HGB 11.2* 12.9 11.8* 11.8* 11.0*  HCT 37.1 38.0 38.6 39.0 36.1  PLT 228  --  225 196 190  MCV 90.5  --  89.4 88.6 88.3  MCH 27.3  --  27.3 26.8 26.9  MCHC 30.2  --  30.6 30.3 30.5  RDW 15.2  --  15.3 15.7* 15.8*  LYMPHSABS  --   --  0.8 1.1 0.9  MONOABS  --   --  0.7 0.8 0.5  EOSABS  --   --  0.0 0.0 0.0  BASOSABS  --   --  0.0 0.0 0.0    Chemistries  Recent Labs  Lab 12/14/19 1355 12/15/19 0416 12/15/19 0522 12/16/19 0811 12/17/19 0312  NA 133* 135 135 134* 134*  K 5.0 3.7 4.0 3.8 3.9  CL 95*  --  95* 94* 95*  CO2 28  --  29 27 28   GLUCOSE 436*  --  366* 84 92  BUN 15  --  15 17 21   CREATININE 1.40*  --  1.31* 1.15* 1.31*  CALCIUM 9.1  --  8.7* 8.4* 8.0*  MG  --   --   --   --  1.7  AST  --   --  28 30 31   ALT  --   --  18 18 16   ALKPHOS  --   --  65 63 61  BILITOT  --   --  0.5 0.7 0.4   ------------------------------------------------------------------------------------------------------------------ Recent Labs    12/17/19 0312  CHOL 56  HDL 26*  LDLCALC 21  TRIG 44  CHOLHDL 2.2    Lab Results  Component Value Date   HGBA1C 9.3 (H) 10/29/2019   ------------------------------------------------------------------------------------------------------------------ No results for input(s): TSH, T4TOTAL, T3FREE, THYROIDAB in the last 72 hours.  Invalid input(s): FREET3 ------------------------------------------------------------------------------------------------------------------ Recent Labs    12/16/19 0811 12/17/19 0312  FERRITIN 78 93    Coagulation profile No results for input(s): INR, PROTIME in the last 168 hours.  Recent Labs    12/16/19 0811 12/17/19 0312    DDIMER 0.96* 1.00*    Cardiac Enzymes No results for input(s): CKMB, TROPONINI, MYOGLOBIN in the last 168 hours.  Invalid input(s): CK ------------------------------------------------------------------------------------------------------------------    Component Value Date/Time   BNP 1,726.9 (H) 12/17/2019 0255    Micro Results Recent Results (from the past 240 hour(s))  SARS Coronavirus 2 by RT PCR (hospital order, performed in [facility] hospital lab) Nasopharyngeal Nasopharyngeal Swab     Status: Abnormal   Collection Time: 12/14/19  7:59 PM   Specimen: Nasopharyngeal Swab  Result Value Ref Range Status   SARS Coronavirus 2 POSITIVE (A) NEGATIVE Final    Comment: RESULT CALLED TO, READ BACK BY AND VERIFIED WITH: RN C PRICE @2203  12/14/19 BY S GEZAHEGN (NOTE) SARS-CoV-2 target nucleic acids are DETECTED  SARS-CoV-2 RNA is generally detectable in upper respiratory specimens  during the acute phase of infection.  Positive results are indicative  of the presence of the identified virus, but do not rule out bacterial infection or co-infection with other pathogens not detected by the test.  Clinical correlation with patient history and  other diagnostic information is necessary to determine patient infection status.  The expected result is negative.  Fact Sheet for Patients:   BoilerBrush.com.cyhttps://www.fda.gov/media/136312/download   Fact Sheet for Healthcare Providers:   https://pope.com/https://www.fda.gov/media/136313/download    This test is not yet approved or cleared by the [place] FDA and  has been authorized for detection and/or diagnosis of SARS-CoV-2 by FDA under an Emergency Use Authorization (EUA).  This EUA will remain in effect (meaning this  test can be used) for the duration of  the COVID-19 declaration under Section 564(b)(1) of the Act, 21 U.S.C. section 360-bbb-3(b)(1), unless the authorization is terminated or revoked sooner.  Performed at [facility], [address] [place], [place] [address]     Radiology Reports DG Chest 2 View  Result Date: 12/14/2019 CLINICAL DATA:  Chest pain EXAM: CHEST - 2 VIEW COMPARISON:  02/07/2018 FINDINGS: Cardiomegaly status post median sternotomy. Left chest multi lead pacer. Mild diffuse interstitial pulmonary opacity. Small bilateral pleural effusions. Disc degenerative disease of the thoracic spine. IMPRESSION: Cardiomegaly with mild diffuse interstitial pulmonary opacity and small bilateral pleural effusions, findings consistent with congestive heart failure. No focal airspace  opacity. Electronically Signed   By: Lauralyn Primes M.D.   On: 12/14/2019 14:56   DG CHEST PORT 1 VIEW  Result Date: 12/17/2019 CLINICAL DATA:  Congestive heart failure. EXAM: PORTABLE CHEST 1 VIEW COMPARISON:  12/15/2019 FINDINGS: Patient is rotated to the left. Cardiomegaly stable. Diffuse interstitial infiltrates are again seen, consistent with interstitial edema. Bilateral pleural effusions are seen, likely increased on the left, with increased left lower lobe atelectasis versus consolidation. Prior CABG again noted. Single lead transvenous pacemaker remains in appropriate position. IMPRESSION: Congestive heart failure, with bilateral pleural effusions, likely increased on the left. Increased left lower lobe atelectasis versus consolidation. Electronically Signed   By: Danae Orleans M.D.   On: 12/17/2019 09:27   DG Chest Port 1V same Day  Result Date: 12/15/2019 CLINICAL DATA:  Shortness of breath. EXAM: PORTABLE CHEST 1 VIEW COMPARISON:  12/14/2019 FINDINGS: The heart is enlarged but appears stable. Moderate central vascular congestion and mild interstitial edema along with small bilateral pleural effusions all suggesting CHF. No definite infiltrates or pneumothorax. IMPRESSION: CHF with small bilateral pleural effusions. Electronically Signed   By: Rudie Meyer M.D.   On: 12/15/2019 07:40   ECHOCARDIOGRAM COMPLETE  Result Date: 12/15/2019     ECHOCARDIOGRAM REPORT   Patient Name:   FRANCOISE CHOJNOWSKI Bence Date of Exam: 12/15/2019 Medical Rec #:  [id number]     Height:       62.0 in Accession #:    [id number]    Weight:       140.0 lb Date of Birth:  31-Aug-1945    BSA:          1.643 m Patient Age:    73 years      BP:           110/79 mmHg Patient Gender: F             HR:           66 bpm. Exam Location:  Inpatient Procedure: 2D Echo and Intracardiac Opacification Agent Indications:    CHF-Acute Systolic 428.21 / I50.21  History:        Patient has prior history of Echocardiogram examinations, most                 recent 09/03/2017. CAD, Prior CABG; Risk Factors:Hypertension and                 Dyslipidemia. Thyroid disease. History of Afib.  Sonographer:    Leta Jungling RDCS Referring Phys: 23 MICHAEL E NORINS IMPRESSIONS  1. Left ventricular ejection fraction, by estimation, is 15-20%. The left ventricle has severely decreased function. The left ventricle demonstrates global hypokinesis. The left ventricular internal cavity size was moderately dilated. Left ventricular diastolic parameters are consistent with Grade II diastolic dysfunction (pseudonormalization). Elevated left atrial pressure.  2. Right ventricular systolic function is severely reduced. The right ventricular size is mildly enlarged. There is normal pulmonary artery systolic pressure. The estimated right ventricular systolic pressure is 33.4 mmHg.  3. Left atrial size was moderately dilated.  4. Right atrial size was mildly dilated.  5. Moderate pleural effusion in the left lateral region.  6. The mitral valve is grossly normal. Severe mitral valve regurgitation.  7. The tricuspid valve is abnormal. Tricuspid valve regurgitation is moderate to severe.  8. The aortic valve is tricuspid. Aortic valve regurgitation is not visualized. Mild aortic valve sclerosis is present, with no evidence of aortic valve stenosis.  9. The inferior vena cava is normal  in size with <50% respiratory variability,  suggesting right atrial pressure of 8 mmHg. Comparison(s): Changes from prior study are noted. EF now 15-20%. Severe MR. Moderate to severe TR. FINDINGS  Left Ventricle: Left ventricular ejection fraction, by estimation, is 15-20%. The left ventricle has severely decreased function. The left ventricle demonstrates global hypokinesis. Definity contrast agent was given IV to delineate the left ventricular endocardial borders. The left ventricular internal cavity size was moderately dilated. There is no left ventricular hypertrophy. Left ventricular diastolic parameters are consistent with Grade II diastolic dysfunction (pseudonormalization). The ratio of pulmonic flow to systemic flow (Qp/Qs ratio) is 1.30. Elevated left atrial pressure. Right Ventricle: The right ventricular size is mildly enlarged. No increase in right ventricular wall thickness. Right ventricular systolic function is severely reduced. There is normal pulmonary artery systolic pressure. The tricuspid regurgitant velocity is 2.52 m/s, and with an assumed right atrial pressure of 8 mmHg, the estimated right ventricular systolic pressure is 33.4 mmHg. Left Atrium: Left atrial size was moderately dilated. Right Atrium: Right atrial size was mildly dilated. Pericardium: There is no evidence of pericardial effusion. Mitral Valve: The mitral valve is grossly normal. Severe mitral valve regurgitation. MV peak gradient, 8.3 mmHg. The mean mitral valve gradient is 2.0 mmHg. Tricuspid Valve: The tricuspid valve is abnormal. Tricuspid valve regurgitation is moderate to severe. No evidence of tricuspid stenosis. Aortic Valve: The aortic valve is tricuspid. . There is mild thickening and mild calcification of the aortic valve. Aortic valve regurgitation is not visualized. Mild aortic valve sclerosis is present, with no evidence of aortic valve stenosis. There is mild thickening of the aortic valve. There is mild calcification of the aortic valve. Pulmonic Valve:  The pulmonic valve was grossly normal. Pulmonic valve regurgitation is mild. No evidence of pulmonic stenosis. Aorta: The aortic root and ascending aorta are structurally normal, with no evidence of dilitation. Venous: The inferior vena cava is normal in size with less than 50% respiratory variability, suggesting right atrial pressure of 8 mmHg. The atrial septum is grossly normal. Additional Comments: A pacer wire is visualized in the right atrium and right ventricle. There is a moderate pleural effusion in the left lateral region.  LEFT VENTRICLE PLAX 2D LVIDd:         6.40 cm      Diastology LVIDs:         5.70 cm      LV e' lateral:   5.63 cm/s LV PW:         0.60 cm      LV E/e' lateral: 22.0 LV IVS:        0.90 cm      LV e' medial:    3.02 cm/s LVOT diam:     1.70 cm      LV E/e' medial:  41.1 LV SV:         23 LV SV Index:   14 LVOT Area:     2.27 cm  LV Volumes (MOD) LV vol d, MOD A2C: 161.0 ml LV vol d, MOD A4C: 176.0 ml LV vol s, MOD A2C: 116.0 ml LV vol s, MOD A4C: 159.0 ml LV SV MOD A2C:     45.0 ml LV SV MOD A4C:     176.0 ml LV SV MOD BP:      32.0 ml RIGHT VENTRICLE RV S prime:     7.90 cm/s RVOT diam:      2.10 cm TAPSE (M-mode): 1.3 cm LEFT ATRIUM  Index       RIGHT ATRIUM           Index LA diam:        5.20 cm 3.17 cm/m  RA Area:     19.20 cm LA Vol (A2C):   79.7 ml 48.51 ml/m RA Volume:   57.10 ml  34.76 ml/m LA Vol (A4C):   54.6 ml 33.24 ml/m LA Biplane Vol: 65.9 ml 40.11 ml/m  AORTIC VALVE             PULMONIC VALVE LVOT Vmax:   49.40 cm/s  RVOT Peak grad: 1 mmHg LVOT Vmean:  31.200 cm/s LVOT VTI:    0.100 m  AORTA Ao Root diam: 3.40 cm MITRAL VALVE                 TRICUSPID VALVE MV Area (PHT): 4.31 cm      TR Peak grad:   25.4 mmHg MV Peak grad:  8.3 mmHg      TR Mean grad:   13.0 mmHg MV Mean grad:  2.0 mmHg      TR Vmax:        252.00 cm/s MV Vmax:       1.44 m/s      TR Vmean:       174.0 cm/s MV Vmean:      66.5 cm/s MV Decel Time: 176 msec      SHUNTS MR Peak grad:     91.4 mmHg   Systemic VTI:  0.10 m MR Mean grad:    56.0 mmHg   Systemic Diam: 1.70 cm MR Vmax:         478.00 cm/s Pulmonic VTI:  0.087 m MR Vmean:        348.0 cm/s  Pulmonic Diam: 2.10 cm MR PISA:         1.01 cm    Qp/Qs:         1.32 MR PISA Eff ROA: 8 mm MR PISA Radius:  0.40 cm MV E velocity: 124.00 cm/s MV A velocity: 69.40 cm/s MV E/A ratio:  1.79 Lennie Odor MD Electronically signed by Lennie Odor MD Signature Date/Time: 12/15/2019/2:52:46 PM    Final

## 2019-12-17 NOTE — Progress Notes (Signed)
Floor coverage  Patient with a past medical history of chronic systolic CHF status post AICD, paroxysmal A. fib on Eliquis, diabetes, partially vaccinated against Covid (received 1 dose of Pfizer vaccine sometime in June) presented on 8/8 with complaints of worsening shortness of breath, leg swelling, and chest pressure.  She also tested positive for Covid but her symptoms were felt to be due to decompensated CHF.  Her troponins have remained mildly elevated but flat and cardiology planning on doing LHC/RHC tomorrow.  Now febrile with Tmax 102.9. Remainder of vitals signs stable. No change in supplemental O2 requirement. Stable on 3L.   -Tylenol prn fevers -CBC, lactate, PCT, UA, urine culture, blood cx x2 -Repeat ferritin, d-dimer, CRP --> if inflammatory markers significantly elevated or hypoxia worsens, consider starting treatment for COVID infection

## 2019-12-17 NOTE — Progress Notes (Signed)
ANTICOAGULATION CONSULT NOTE - Follow Up Consult  Pharmacy Consult for heparin Indication: chest pain/ACS and atrial fibrillation  Labs: Recent Labs     0000 12/14/19 1355 12/14/19 1946 12/15/19 0416 12/15/19 0522 12/15/19 0522 12/16/19 0811 12/17/19 0312  HGB  --  11.2*  --    < > 11.8*   < > 11.8* 11.0*  HCT  --  37.1  --    < > 38.6  --  39.0 36.1  PLT   < > 228  --   --  225  --  196 190  APTT  --   --   --   --   --   --   --  148*  HEPARINUNFRC  --   --   --   --   --   --   --  >2.20*  CREATININE   < > 1.40*  --   --  1.31*  --  1.15* 1.31*  TROPONINIHS  --  23* 23*  --  36*  --   --   --    < > = values in this interval not displayed.    Assessment: 74yo female supratherapeutic on heparin with initial dosing while Eliquis on hold.  Goal of Therapy:  aPTT 66-102 seconds   Plan:  Will decrease heparin gtt by 2-3 units/kg/hr to 700 units/hr and check level in 8 hours.    Vernard Gambles, PharmD, BCPS  12/17/2019,6:14 AM

## 2019-12-17 NOTE — Progress Notes (Signed)
   12/16/19 2144  Assess: MEWS Score  Temp (!) 100.7 F (38.2 C)  BP 104/69  Pulse Rate 80  ECG Heart Rate 80  Resp (!) 23  Level of Consciousness Alert  SpO2 100 %  O2 Device Nasal Cannula  O2 Flow Rate (L/min) 3 L/min  Assess: MEWS Score  MEWS Temp 1  MEWS Systolic 0  MEWS Pulse 0  MEWS RR 1  MEWS LOC 0  MEWS Score 2  MEWS Score Color Yellow  Assess: if the MEWS score is Yellow or Red  Were vital signs taken at a resting state? Yes  Focused Assessment Change from prior assessment (see assessment flowsheet)  Early Detection of Sepsis Score *See Row Information* Low  MEWS guidelines implemented *See Row Information* Yes  Treat  Pain Scale 0-10  Pain Score 0  Patients Stated Pain Goal 0  Take Vital Signs  Increase Vital Sign Frequency  Yellow: Q 2hr X 2 then Q 4hr X 2, if remains yellow, continue Q 4hrs  Escalate  MEWS: Escalate Yellow: discuss with charge nurse/RN and consider discussing with provider and RRT  Notify: Charge Nurse/RN  Name of Charge Nurse/RN Notified Angie, RN  Date Charge Nurse/RN Notified 12/16/19  Time Charge Nurse/RN Notified 2211  Notify: Provider  Provider Name/Title Rathore, MD  Date Provider Notified 12/16/19  Time Provider Notified 2214  Notification Type Page  Notification Reason Change in status  Response See new orders  Date of Provider Response 12/17/19  Time of Provider Response 0001  Document  Patient Outcome Not stable and remains on department  Progress note created (see row info) Yes   Bari Edward, RN

## 2019-12-17 NOTE — Progress Notes (Signed)
ANTICOAGULATION CONSULT NOTE - Follow Up Consult  Pharmacy Consult for apixaban>heparin Indication: chest pain/ACS  Allergies  Allergen Reactions  . Crestor [Rosuvastatin] Other (See Comments)    Pain in hips and joints  . Lipitor [Atorvastatin] Other (See Comments)    Very bad pain in hips and joints    Patient Measurements: Height: 5\' 2"  (157.5 cm) Weight: 66.2 kg (145 lb 15.1 oz) IBW/kg (Calculated) : 50.1 Heparin Dosing Weight: 62.9 kg  Vital Signs: Temp: 98.8 F (37.1 C) (08/11 1506) Temp Source: Oral (08/11 1506) BP: 112/71 (08/11 1506) Pulse Rate: 78 (08/11 1306)  Labs: Recent Labs    12/15/19 0522 12/15/19 0522 12/16/19 0811 12/17/19 0312 12/17/19 1521  HGB 11.8*   < > 11.8* 11.0*  --   HCT 38.6  --  39.0 36.1  --   PLT 225  --  196 190  --   APTT  --   --   --  148* >200*  HEPARINUNFRC  --   --   --  >2.20*  --   CREATININE 1.31*  --  1.15* 1.31*  --   TROPONINIHS 36*  --   --   --   --    < > = values in this interval not displayed.    Estimated Creatinine Clearance: 33.1 mL/min (A) (by C-G formula based on SCr of 1.31 mg/dL (H)).   Medical History: Past Medical History:  Diagnosis Date  . Cholelithiases   . Coronary artery disease 02/2007   CABG x4  . Diabetes (HCC)   . Dyslipidemia   . HTN (hypertension)   . Hypothyroid   . PAF (paroxysmal atrial fibrillation) (HCC)    post op    Medications:  Scheduled:  . albuterol  2 puff Inhalation Q6H  . amiodarone  100 mg Oral Daily  . vitamin C  500 mg Oral Daily  . aspirin  81 mg Oral Daily  . cholecalciferol  1,000 Units Oral Daily  . ezetimibe  10 mg Oral QHS  . feeding supplement (ENSURE ENLIVE)  237 mL Oral Daily  . furosemide  80 mg Intravenous BID  . insulin aspart  0-15 Units Subcutaneous TID WC  . levothyroxine  50 mcg Oral Q0600  . losartan  25 mg Oral Daily  . pantoprazole  40 mg Oral Q1200  . simvastatin  40 mg Oral QHS  . sodium chloride flush  3 mL Intravenous Q12H  . sodium  chloride flush  3 mL Intravenous Q12H  . spironolactone  25 mg Oral Daily  . venlafaxine XR  150 mg Oral QHS  . vitamin B-12  1,000 mcg Oral Daily  . zinc sulfate  220 mg Oral Daily   Infusions:    Assessment: 73 yof found to have COVID-19, now presenting CHF with possible plans for [facility]. On apixaban for Afib (LD 8/6 PTA)- received dose this AM on 8/10@0900 .   Repeat aPTT is > 200 despite earlier rate decreased. RN confirmed that lab drew the level from the opposite arm that heparin was infusing into. Now with new hematuria. Heparin was paused for 4 hours but hematuria persists. Discussed case with night MD who recommends holding IV heparin until AM since patient remains chest free and EKG is in sinus rhythm.   Goal of Therapy:  Heparin level 0.3-0.7 units/ml aPTT 66-102 seconds Monitor platelets by anticoagulation protocol: Yes   Plan:  Hold heparin infusion overnight  F/u plan for [facility] and further anticoag in AM   [place]  Jayde Daffin, PharmD., BCPS, BCCCP Clinical Pharmacist

## 2019-12-17 NOTE — Progress Notes (Signed)
   12/17/19 2300  Assess: MEWS Score  Pulse Rate 80  ECG Heart Rate 80  Resp (!) 21  SpO2 91 %  Assess: MEWS Score  MEWS Temp 1  MEWS Systolic 0  MEWS Pulse 0  MEWS RR 1  MEWS LOC 0  MEWS Score 2  MEWS Score Color Yellow  Assess: if the MEWS score is Yellow or Red  Were vital signs taken at a resting state? Yes  Focused Assessment No change from prior assessment  Early Detection of Sepsis Score *See Row Information* Low  MEWS guidelines implemented *See Row Information* No, vital signs rechecked (RR sl. elevated @ 21/min, will recheck)

## 2019-12-17 NOTE — Progress Notes (Signed)
   12/17/19 0906  Assess: MEWS Score  Level of Consciousness Responds to Voice  Assess: MEWS Score  MEWS Temp 0  MEWS Systolic 0  MEWS Pulse 0  MEWS RR 1  MEWS LOC 1  MEWS Score 2  MEWS Score Color Yellow  Assess: if the MEWS score is Yellow or Red  Were vital signs taken at a resting state? Yes  Focused Assessment Change from prior assessment (see assessment flowsheet)  Early Detection of Sepsis Score *See Row Information* Low  MEWS guidelines implemented *See Row Information* No, previously yellow, continue vital signs every 4 hours  Treat  Pain Scale 0-10  Pain Score 0  Take Vital Signs  Increase Vital Sign Frequency  Yellow: Q 2hr X 2 then Q 4hr X 2, if remains yellow, continue Q 4hrs  Escalate  MEWS: Escalate Yellow: discuss with charge nurse/RN and consider discussing with provider and RRT  Notify: Charge Nurse/RN  Name of Charge Nurse/RN Notified Abigal, RN  Date Charge Nurse/RN Notified 12/17/19  Time Charge Nurse/RN Notified [phone]  Notify: Provider  Provider Name/Title Patel  Date Provider Notified 12/17/19  Time Provider Notified [phone]  Notification Type Page  Notification Reason Change in status  Response See new orders  Date of Provider Response 12/17/19  Time of Provider Response [phone]   Patient confused and a change from yesterday baseline. MD aware. Will continue to monitor closely.

## 2019-12-18 ENCOUNTER — Inpatient Hospital Stay (HOSPITAL_COMMUNITY): Payer: Medicare Other

## 2019-12-18 DIAGNOSIS — I5043 Acute on chronic combined systolic (congestive) and diastolic (congestive) heart failure: Secondary | ICD-10-CM | POA: Diagnosis not present

## 2019-12-18 LAB — CBC
HCT: 42.8 % (ref 36.0–46.0)
Hemoglobin: 12.6 g/dL (ref 12.0–15.0)
MCH: 26.5 pg (ref 26.0–34.0)
MCHC: 29.4 g/dL — ABNORMAL LOW (ref 30.0–36.0)
MCV: 90.1 fL (ref 80.0–100.0)
Platelets: 144 10*3/uL — ABNORMAL LOW (ref 150–400)
RBC: 4.75 MIL/uL (ref 3.87–5.11)
RDW: 16.2 % — ABNORMAL HIGH (ref 11.5–15.5)
WBC: 4.4 10*3/uL (ref 4.0–10.5)
nRBC: 0 % (ref 0.0–0.2)

## 2019-12-18 LAB — BASIC METABOLIC PANEL
Anion gap: 13 (ref 5–15)
Anion gap: 11 (ref 5–15)
BUN: 24 mg/dL — ABNORMAL HIGH (ref 8–23)
BUN: 29 mg/dL — ABNORMAL HIGH (ref 8–23)
CO2: 28 mmol/L (ref 22–32)
CO2: 28 mmol/L (ref 22–32)
Calcium: 7.8 mg/dL — ABNORMAL LOW (ref 8.9–10.3)
Calcium: 8.1 mg/dL — ABNORMAL LOW (ref 8.9–10.3)
Chloride: 94 mmol/L — ABNORMAL LOW (ref 98–111)
Chloride: 93 mmol/L — ABNORMAL LOW (ref 98–111)
Creatinine, Ser: 1.59 mg/dL — ABNORMAL HIGH (ref 0.44–1.00)
Creatinine, Ser: 1.79 mg/dL — ABNORMAL HIGH (ref 0.44–1.00)
GFR calc Af Amer: 36 mL/min — ABNORMAL LOW (ref >60)
GFR calc Af Amer: 32 mL/min — ABNORMAL LOW (ref >60)
GFR calc non Af Amer: 31 mL/min — ABNORMAL LOW (ref >60)
GFR calc non Af Amer: 27 mL/min — ABNORMAL LOW (ref >60)
Glucose, Bld: 102 mg/dL — ABNORMAL HIGH (ref 70–99)
Glucose, Bld: 140 mg/dL — ABNORMAL HIGH (ref 70–99)
Potassium: 4.2 mmol/L (ref 3.5–5.1)
Potassium: 4.6 mmol/L (ref 3.5–5.1)
Sodium: 135 mmol/L (ref 135–145)
Sodium: 132 mmol/L — ABNORMAL LOW (ref 135–145)

## 2019-12-18 LAB — COMPREHENSIVE METABOLIC PANEL
ALT: 16 U/L (ref 0–44)
AST: 40 U/L (ref 15–41)
Albumin: 2.9 g/dL — ABNORMAL LOW (ref 3.5–5.0)
Alkaline Phosphatase: 58 U/L (ref 38–126)
Anion gap: 13 (ref 5–15)
BUN: 23 mg/dL (ref 8–23)
CO2: 28 mmol/L (ref 22–32)
Calcium: 8.2 mg/dL — ABNORMAL LOW (ref 8.9–10.3)
Chloride: 93 mmol/L — ABNORMAL LOW (ref 98–111)
Creatinine, Ser: 1.63 mg/dL — ABNORMAL HIGH (ref 0.44–1.00)
GFR calc Af Amer: 35 mL/min — ABNORMAL LOW (ref >60)
GFR calc non Af Amer: 31 mL/min — ABNORMAL LOW (ref >60)
Glucose, Bld: 108 mg/dL — ABNORMAL HIGH (ref 70–99)
Potassium: 5.9 mmol/L — ABNORMAL HIGH (ref 3.5–5.1)
Sodium: 134 mmol/L — ABNORMAL LOW (ref 135–145)
Total Bilirubin: 0.8 mg/dL (ref 0.3–1.2)
Total Protein: 6.1 g/dL — ABNORMAL LOW (ref 6.5–8.1)

## 2019-12-18 LAB — CBC WITH DIFFERENTIAL/PLATELET
Abs Immature Granulocytes: 0.02 10*3/uL (ref 0.00–0.07)
Basophils Absolute: 0 10*3/uL (ref 0.0–0.1)
Basophils Relative: 1 %
Eosinophils Absolute: 0 10*3/uL (ref 0.0–0.5)
Eosinophils Relative: 0 %
HCT: 42.3 % (ref 36.0–46.0)
Hemoglobin: 12.9 g/dL (ref 12.0–15.0)
Immature Granulocytes: 1 %
Lymphocytes Relative: 30 %
Lymphs Abs: 1.3 10*3/uL (ref 0.7–4.0)
MCH: 27.7 pg (ref 26.0–34.0)
MCHC: 30.5 g/dL (ref 30.0–36.0)
MCV: 90.8 fL (ref 80.0–100.0)
Monocytes Absolute: 0.4 10*3/uL (ref 0.1–1.0)
Monocytes Relative: 10 %
Neutro Abs: 2.5 10*3/uL (ref 1.7–7.7)
Neutrophils Relative %: 58 %
Platelets: 155 10*3/uL (ref 150–400)
RBC: 4.66 MIL/uL (ref 3.87–5.11)
RDW: 16.3 % — ABNORMAL HIGH (ref 11.5–15.5)
WBC: 4.3 10*3/uL (ref 4.0–10.5)
nRBC: 0 % (ref 0.0–0.2)

## 2019-12-18 LAB — URINE CULTURE: Culture: <10000 — AB

## 2019-12-18 LAB — POTASSIUM: Potassium: 4.3 mmol/L (ref 3.5–5.1)

## 2019-12-18 LAB — GLUCOSE, CAPILLARY
Glucose-Capillary: 98 mg/dL (ref 70–99)
Glucose-Capillary: 178 mg/dL — ABNORMAL HIGH (ref 70–99)
Glucose-Capillary: 128 mg/dL — ABNORMAL HIGH (ref 70–99)

## 2019-12-18 LAB — C-REACTIVE PROTEIN: CRP: 1.9 mg/dL — ABNORMAL HIGH (ref <1.0)

## 2019-12-18 LAB — FERRITIN: Ferritin: 209 ng/mL (ref 11–307)

## 2019-12-18 LAB — D-DIMER, QUANTITATIVE: D-Dimer, Quant: 0.93 ug/mL-FEU — ABNORMAL HIGH (ref 0.00–0.50)

## 2019-12-18 MED ORDER — SODIUM CHLORIDE 0.9% FLUSH
3.0000 mL | Freq: Two times a day (BID) | INTRAVENOUS | Status: DC
  Administered 2019-12-18 – 2019-12-21 (×4): 3 mL via INTRAVENOUS

## 2019-12-18 MED ORDER — SODIUM CHLORIDE 0.9 % IV SOLN
200.0000 mg | Freq: Once | INTRAVENOUS | Status: DC
Start: 2019-12-18 — End: 2019-12-18

## 2019-12-18 MED ORDER — SODIUM CHLORIDE 0.9 % IV SOLN: 100.0000 mg | Freq: Every day | INTRAVENOUS | Status: DC

## 2019-12-18 MED ORDER — SPIRONOLACTONE 12.5 MG HALF TABLET
12.5000 mg | ORAL_TABLET | Freq: Every day | ORAL | Status: DC
  Administered 2019-12-18 – 2019-12-21 (×4): 12.5 mg via ORAL
  Filled 2019-12-18 (×4): qty 1

## 2019-12-18 MED ORDER — FUROSEMIDE 10 MG/ML IJ SOLN
80.0000 mg | Freq: Once | INTRAMUSCULAR | Status: AC
  Administered 2019-12-18: 80 mg via INTRAVENOUS
  Filled 2019-12-18: qty 8

## 2019-12-18 MED ORDER — SODIUM CHLORIDE 0.9 % IV SOLN: 250.0000 mL | INTRAVENOUS | Status: DC | PRN

## 2019-12-18 MED ORDER — SODIUM CHLORIDE 0.9 % IV SOLN
200.0000 mg | Freq: Once | INTRAVENOUS | Status: AC
  Administered 2019-12-18: 200 mg via INTRAVENOUS
  Filled 2019-12-18: qty 200

## 2019-12-18 MED ORDER — SODIUM ZIRCONIUM CYCLOSILICATE 10 G PO PACK
10.0000 g | PACK | Freq: Once | ORAL | Status: AC
  Administered 2019-12-18: 10 g via ORAL
  Filled 2019-12-18: qty 1

## 2019-12-18 MED ORDER — SODIUM CHLORIDE 0.9% FLUSH: 3.0000 mL | INTRAVENOUS | Status: DC | PRN

## 2019-12-18 MED ORDER — SODIUM CHLORIDE 0.9 % IV SOLN
100.0000 mg | Freq: Every day | INTRAVENOUS | Status: AC
  Administered 2019-12-19 – 2019-12-22 (×4): 100 mg via INTRAVENOUS
  Filled 2019-12-18 (×4): qty 20

## 2019-12-18 NOTE — Progress Notes (Signed)
   12/18/19 0408  Assess: MEWS Score  Temp (!) 100.5 F (38.1 C)  BP 108/64  Pulse Rate 79  ECG Heart Rate 80  Resp 10  SpO2 95 %  O2 Device Nasal Cannula  Assess: MEWS Score  MEWS Temp 1  MEWS Systolic 0  MEWS Pulse 0  MEWS RR 1  MEWS LOC 0  MEWS Score 2  MEWS Score Color Yellow  Assess: if the MEWS score is Yellow or Red  Were vital signs taken at a resting state? Yes  Focused Assessment No change from prior assessment  Early Detection of Sepsis Score *See Row Information* Low  MEWS guidelines implemented *See Row Information* No, vital signs rechecked (Pt temp is elevated, will give tylenol & recheck)

## 2019-12-18 NOTE — H&P (View-Only) (Signed)
Patient ID: [name], female   DOB: 02/03/1946, 73 y.o.   MRN: [id number]     Advanced Heart Failure Rounding Note  PCP-Cardiologist: Kenneth C Hilty, MD   Subjective:    I/Os negative.  Tm 101.  Breathing is better.  Creatinine up to 1.6.    Objective:   Weight Range: 66.2 kg Body mass index is 26.69 kg/m.   Vital Signs:   Temp:  [98 F (36.7 C)-101 F (38.3 C)] 98 F (36.7 C) (08/12 0600) Pulse Rate:  [75-87] 77 (08/12 0600) Resp:  [10-26] 14 (08/12 0641) BP: (101-121)/(61-71) 108/64 (08/12 0408) SpO2:  [80 %-100 %] 91 % (08/12 0641)    Weight change: Filed Weights   12/14/19 1418 12/16/19 0900 12/17/19 0404  Weight: 63.5 kg 63.5 kg 66.2 kg    Intake/Output:   Intake/Output Summary (Last 24 hours) at 12/18/2019 1415 Last data filed at 12/18/2019 0600 Gross per 24 hour  Intake 259.8 ml  Output 1775 ml  Net -1515.2 ml      Physical Exam    General: NAD Neck: JVP 12 cm, no thyromegaly or thyroid nodule.  Lungs: Clear to auscultation bilaterally with normal respiratory effort. CV: Nondisplaced PMI.  Heart regular S1/S2, no S3/S4, 2/6 HSM apex/LLSB.  No peripheral edema.    Abdomen: Soft, nontender, no hepatosplenomegaly, no distention.  Skin: Intact without lesions or rashes.  Neurologic: Alert and oriented x 3.  Psych: Normal affect. Extremities: No clubbing or cyanosis.  HEENT: Normal.    Telemetry   NSR, personally reviewed.   Labs    CBC Recent Labs    12/17/19 0312 12/18/19 0346  WBC 4.2 4.3  NEUTROABS 2.8 2.5  HGB 11.0* 12.9  HCT 36.1 42.3  MCV 88.3 90.8  PLT 190 155   Basic Metabolic Panel Recent Labs    12/17/19 0312 12/17/19 0312 12/18/19 0346 12/18/19 0346 12/18/19 0812 12/18/19 1235  NA 134*   < > 134*  --  135  --   K 3.9   < > 5.9*   < > 4.2 4.3  CL 95*   < > 93*  --  94*  --   CO2 28   < > 28  --  28  --   GLUCOSE 92   < > 108*  --  102*  --   BUN 21   < > 23  --  24*  --   CREATININE 1.31*   < > 1.63*  --   1.59*  --   CALCIUM 8.0*   < > 8.2*  --  7.8*  --   MG 1.7  --   --   --   --   --    < > = values in this interval not displayed.   Liver Function Tests Recent Labs    12/17/19 0312 12/18/19 0346  AST 31 40  ALT 16 16  ALKPHOS 61 58  BILITOT 0.4 0.8  PROT 5.9* 6.1*  ALBUMIN 2.8* 2.9*   No results for input(s): LIPASE, AMYLASE in the last 72 hours. Cardiac Enzymes No results for input(s): CKTOTAL, CKMB, CKMBINDEX, TROPONINI in the last 72 hours.  BNP: BNP (last 3 results) Recent Labs    12/14/19 1355 12/14/19 2302 12/17/19 0255  BNP 4,206.6* 3,317.2* 1,726.9*    ProBNP (last 3 results) No results for input(s): PROBNP in the last 8760 hours.   D-Dimer Recent Labs    12/17/19 0312 12/18/19 0346  DDIMER 1.00* 0.93*     Hemoglobin A1C No results for input(s): HGBA1C in the last 72 hours. Fasting Lipid Panel Recent Labs    12/17/19 0312  CHOL 56  HDL 26*  LDLCALC 21  TRIG 44  CHOLHDL 2.2   Thyroid Function Tests No results for input(s): TSH, T4TOTAL, T3FREE, THYROIDAB in the last 72 hours.  Invalid input(s): FREET3  Other results:   Imaging    No results found.   Medications:     Scheduled Medications: . albuterol  2 puff Inhalation Q6H  . amiodarone  100 mg Oral Daily  . vitamin C  500 mg Oral Daily  . aspirin  81 mg Oral Daily  . cholecalciferol  1,000 Units Oral Daily  . ezetimibe  10 mg Oral QHS  . feeding supplement (ENSURE ENLIVE)  237 mL Oral Daily  . furosemide  80 mg Intravenous Once  . insulin aspart  0-15 Units Subcutaneous TID WC  . levothyroxine  50 mcg Oral Q0600  . pantoprazole  40 mg Oral Q1200  . simvastatin  40 mg Oral QHS  . sodium chloride flush  3 mL Intravenous Q12H  . sodium chloride flush  3 mL Intravenous Q12H  . spironolactone  12.5 mg Oral Daily  . venlafaxine XR  150 mg Oral QHS  . vitamin B-12  1,000 mcg Oral Daily  . zinc sulfate  220 mg Oral Daily    Infusions: . sodium chloride    . sodium chloride     . sodium chloride 10 mL/hr at 12/18/19 0525  . [START ON 12/19/2019] remdesivir 100 mg in NS 100 mL      PRN Medications: sodium chloride, sodium chloride, acetaminophen, guaiFENesin-dextromethorphan, nitroGLYCERIN, ondansetron (ZOFRAN) IV, sodium chloride flush, sodium chloride flush, traMADol    Assessment/Plan   1. COVID-19 infection: Only got 1 dose of Pfizer vaccine, missed 2nd dose. Suspect main issues here is CHF, not COVID PNA. However, she has had periodic fevers, most 101 last night.  CXR with pulmonary edema, possible LLL infiltrate.  2. Acute on chronic systolic CHF: History of ischemic cardiomyopathy.  MDT ICD.  Echo this admission with fall in EF to 15-20%, global hypokinesis, moderate LV dilation, severely decreased RV function, severe MR, moderate-severe TR.  Volume status improving, still with JVD. Creatinine up to 1.6.  - Hold losartan with elevated creatinine.  - Can decrease spironolactone to 12.5 daily.  - Held am Lasix, will give 1 dose of Lasix 80 mg IV this afternoon and no metolazone.  - Patient has LBBB but probably not wide enough (129 msec) to benefit much from CRT.  3. CAD: CABG x 4 2008. NSTEMI 05/2017>>LHC w/ occluded SVG-PDA and serial 70% and 90% stenoses in the proximal to mid RCA, treated w/ DES x 2 to RCA.  HS troponin this admit low level and flat, 23>>23>>36. Chest tightness on and off but now resolved, EF is lower on echo this admission.  - Continue statin, good LDL 21.  - ASA 81 - She will need right/left heart cath with chest pain and worsening LV function, cath cancelled today again with rising creatinine and ongoing fevers. Possibly Friday if creatinine stable/improved with the above medication changes. Discussed risks/benefits with patient and she agrees. 4. UTI: Treating with ceftriaxone.  5. Type 2 diabetes: Would benefit from dapagliflozin eventually.  6. 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  7. Atrial  fibrillation: Paroxysmal.  NSR today.  She is on amiodarone.  - Continue amiodarone.  - Hold  Eliquis for cath, now off heparin gtt with hematuria.  Will leave off heparin for now, restart Eliquis after cath.   Length of Stay: 3  Marca Ancona, MD  12/18/2019, 2:15 PM  Advanced Heart Failure Team Pager [phone] (M-F; 7a - 4p)  Please contact [facility] for night-coverage after hours (4p -7a ) and weekends on [email or web address]

## 2019-12-18 NOTE — Progress Notes (Addendum)
Patient ID: [name], female   DOB: 06/14/1945, 74 y.o.   MRN: [id number]     Advanced Heart Failure Rounding Note  PCP-Cardiologist: Chrystie Nose, MD   Subjective:    I/Os negative.  Tm 101.  Breathing is better.  Creatinine up to 1.6.    Objective:   Weight Range: 66.2 kg Body mass index is 26.69 kg/m.   Vital Signs:   Temp:  [98 F (36.7 C)-101 F (38.3 C)] 98 F (36.7 C) (08/12 0600) Pulse Rate:  [75-87] 77 (08/12 0600) Resp:  [10-26] 14 (08/12 0641) BP: (101-121)/(61-71) 108/64 (08/12 0408) SpO2:  [80 %-100 %] 91 % (08/12 0641)    Weight change: Filed Weights   12/14/19 1418 12/16/19 0900 12/17/19 0404  Weight: 63.5 kg 63.5 kg 66.2 kg    Intake/Output:   Intake/Output Summary (Last 24 hours) at 12/18/2019 1415 Last data filed at 12/18/2019 0600 Gross per 24 hour  Intake 259.8 ml  Output 1775 ml  Net -1515.2 ml      Physical Exam    General: NAD Neck: JVP 12 cm, no thyromegaly or thyroid nodule.  Lungs: Clear to auscultation bilaterally with normal respiratory effort. CV: Nondisplaced PMI.  Heart regular S1/S2, no S3/S4, 2/6 HSM apex/LLSB.  No peripheral edema.    Abdomen: Soft, nontender, no hepatosplenomegaly, no distention.  Skin: Intact without lesions or rashes.  Neurologic: Alert and oriented x 3.  Psych: Normal affect. Extremities: No clubbing or cyanosis.  HEENT: Normal.    Telemetry   NSR, personally reviewed.   Labs    CBC Recent Labs    12/17/19 0312 12/18/19 0346  WBC 4.2 4.3  NEUTROABS 2.8 2.5  HGB 11.0* 12.9  HCT 36.1 42.3  MCV 88.3 90.8  PLT 190 155   Basic Metabolic Panel Recent Labs    48/54/62 0312 12/17/19 0312 12/18/19 0346 12/18/19 0346 12/18/19 0812 12/18/19 1235  NA 134*   < > 134*  --  135  --   K 3.9   < > 5.9*   < > 4.2 4.3  CL 95*   < > 93*  --  94*  --   CO2 28   < > 28  --  28  --   GLUCOSE 92   < > 108*  --  102*  --   BUN 21   < > 23  --  24*  --   CREATININE 1.31*   < > 1.63*  --   1.59*  --   CALCIUM 8.0*   < > 8.2*  --  7.8*  --   MG 1.7  --   --   --   --   --    < > = values in this interval not displayed.   Liver Function Tests Recent Labs    12/17/19 0312 12/18/19 0346  AST 31 40  ALT 16 16  ALKPHOS 61 58  BILITOT 0.4 0.8  PROT 5.9* 6.1*  ALBUMIN 2.8* 2.9*   No results for input(s): LIPASE, AMYLASE in the last 72 hours. Cardiac Enzymes No results for input(s): CKTOTAL, CKMB, CKMBINDEX, TROPONINI in the last 72 hours.  BNP: BNP (last 3 results) Recent Labs    12/14/19 1355 12/14/19 2302 12/17/19 0255  BNP 4,206.6* 3,317.2* 1,726.9*    ProBNP (last 3 results) No results for input(s): PROBNP in the last 8760 hours.   D-Dimer Recent Labs    12/17/19 0312 12/18/19 0346  DDIMER 1.00* 0.93*  Hemoglobin A1C No results for input(s): HGBA1C in the last 72 hours. Fasting Lipid Panel Recent Labs    12/17/19 0312  CHOL 56  HDL 26*  LDLCALC 21  TRIG 44  CHOLHDL 2.2   Thyroid Function Tests No results for input(s): TSH, T4TOTAL, T3FREE, THYROIDAB in the last 72 hours.  Invalid input(s): FREET3  Other results:   Imaging    No results found.   Medications:     Scheduled Medications: . albuterol  2 puff Inhalation Q6H  . amiodarone  100 mg Oral Daily  . vitamin C  500 mg Oral Daily  . aspirin  81 mg Oral Daily  . cholecalciferol  1,000 Units Oral Daily  . ezetimibe  10 mg Oral QHS  . feeding supplement (ENSURE ENLIVE)  237 mL Oral Daily  . furosemide  80 mg Intravenous Once  . insulin aspart  0-15 Units Subcutaneous TID WC  . levothyroxine  50 mcg Oral Q0600  . pantoprazole  40 mg Oral Q1200  . simvastatin  40 mg Oral QHS  . sodium chloride flush  3 mL Intravenous Q12H  . sodium chloride flush  3 mL Intravenous Q12H  . spironolactone  12.5 mg Oral Daily  . venlafaxine XR  150 mg Oral QHS  . vitamin B-12  1,000 mcg Oral Daily  . zinc sulfate  220 mg Oral Daily    Infusions: . sodium chloride    . sodium chloride     . sodium chloride 10 mL/hr at 12/18/19 0525  . [START ON 12/19/2019] remdesivir 100 mg in NS 100 mL      PRN Medications: sodium chloride, sodium chloride, acetaminophen, guaiFENesin-dextromethorphan, nitroGLYCERIN, ondansetron (ZOFRAN) IV, sodium chloride flush, sodium chloride flush, traMADol    Assessment/Plan   1. COVID-19 infection: Only got 1 dose of Pfizer vaccine, missed 2nd dose. Suspect main issues here is CHF, not COVID PNA. However, she has had periodic fevers, most 101 last night.  CXR with pulmonary edema, possible LLL infiltrate.  2. Acute on chronic systolic CHF: History of ischemic cardiomyopathy.  MDT ICD.  Echo this admission with fall in EF to 15-20%, global hypokinesis, moderate LV dilation, severely decreased RV function, severe MR, moderate-severe TR.  Volume status improving, still with JVD. Creatinine up to 1.6.  - Hold losartan with elevated creatinine.  - Can decrease spironolactone to 12.5 daily.  - Held am Lasix, will give 1 dose of Lasix 80 mg IV this afternoon and no metolazone.  - Patient has LBBB but probably not wide enough (129 msec) to benefit much from CRT.  3. CAD: CABG x 4 2008. NSTEMI 05/2017>>LHC w/ occluded SVG-PDA and serial 70% and 90% stenoses in the proximal to mid RCA, treated w/ DES x 2 to RCA.  HS troponin this admit low level and flat, 23>>23>>36. Chest tightness on and off but now resolved, EF is lower on echo this admission.  - Continue statin, good LDL 21.  - ASA 81 - She will need right/left heart cath with chest pain and worsening LV function, cath cancelled today again with rising creatinine and ongoing fevers. Possibly Friday if creatinine stable/improved with the above medication changes. Discussed risks/benefits with patient and she agrees. 4. UTI: Treating with ceftriaxone.  5. Type 2 diabetes: Would benefit from dapagliflozin eventually.  6. 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  7. Atrial  fibrillation: Paroxysmal.  NSR today.  She is on amiodarone.  - Continue amiodarone.  - Hold  Eliquis for cath, now off heparin gtt with hematuria.  Will leave off heparin for now, restart Eliquis after cath.   Length of Stay: 3  Marca Ancona, MD  12/18/2019, 2:15 PM  Advanced Heart Failure Team Pager [phone] (M-F; 7a - 4p)  Please contact [facility] for night-coverage after hours (4p -7a ) and weekends on [email or web address]

## 2019-12-18 NOTE — Progress Notes (Signed)
   12/18/19 0000  Assess: MEWS Score  Pulse Rate 78  ECG Heart Rate 79  Resp (!) 26  SpO2 (!) 84 %  Assess: MEWS Score  MEWS Temp 0  MEWS Systolic 0  MEWS Pulse 0  MEWS RR 2  MEWS LOC 0  MEWS Score 2  MEWS Score Color Yellow  Assess: if the MEWS score is Yellow or Red  Were vital signs taken at a resting state? Yes  Focused Assessment No change from prior assessment  Early Detection of Sepsis Score *See Row Information* Low  MEWS guidelines implemented *See Row Information* No, vital signs rechecked (will recheck RR)

## 2019-12-18 NOTE — Progress Notes (Signed)
[facility] Progress Note  Patient: [name]    INO:[id number]  DOA: 12/14/2019     Date of Service: the patient was seen and examined on 12/18/2019  Brief hospital course: Past medical history of chronic systolic CHF, SP AICD implant, PAF on Eliquis, type II DM, partially vaccinated.  Presents with shortness of breath and swelling in the leg with chest pain found to have acute decompensated systolic CHF.  Tested positive for Covid.  Started having fever on 8/11//2021.  Currently plan is continue treating Covid and further work-up for CHF per cardiology.  Assessment and Plan: 1.  Acute hypoxic respiratory failure Acute on chronic systolic CHF SP AICD placement. History of CAD Chest pain/angina Presents with shortness of breath and cough.  Also had chest pain. Found to have significant volume overload consistent with decompensated systolic CHF. Cardiology was consulted. Patient was started on aggressive IV diuresis. Currently volume significantly better. Oxygenation stable. Cardiology planning left and right heart cath likely tomorrow. Continue to monitor on telemetry. Currently ruled out for ACS given flat troponin and no chest pain. Patient was on IV heparin for ACS protocol which is currently discontinued due to hematuria.  2.  COVID-19 pneumonia Acute COVID-19 Viral illness Lab Results  Component Value Date   SARSCOV2NAA POSITIVE (A) 12/14/2019   SARSCOV2NAA NEGATIVE 10/29/2019   CXR: hazy bilateral peripheral opacities  Recent Labs    12/16/19 0811 12/17/19 0312 12/18/19 0346  DDIMER 0.96* 1.00* 0.93*  FERRITIN 78 93 209  CRP 1.4* 1.2* 1.9*   Tmax last 24 hours:  Temp (24hrs), Avg:99.5 F (37.5 C), Min:98 F (36.7 C), Max:101 F (38.3 C)  Oxygen requirements: Currently on 2 LPM Diuretics: Resuming Lasix Vitamin C and Zinc: Continue DVT Prophylaxis: SCD, pharmacological prophylaxis contraindicated due to Due to GI bleed  remdesivir: Started on  12/17/2019  Prone positioning: Patient encouraged to stay in prone position as much as possible.  PPE During this encounter: Patient Isolation: Airborne + Droplet + Contact HCP PPE: Surgical mask, face shield, gown, gloves Patient PPE: None  The treatment plan and use of medications and known side effects were discussed with patient/family. It was clearly explained that there is no proven definitive treatment for COVID-19 infection yet. Any medications used here are based on case reports/anecdotal data which are not peer-reviewed and has not been studied using randomized control trials.  Complete risks and long-term side effects are unknown, however in the best clinical judgment they seem to be of some clinical benefit rather than medical risks.  Patient/family agree with the treatment plan and want to receive these treatments as indicated.   3.  Hematuria In the setting of being on IV heparin. Check CT renal protocol to rule out any acute abnormality. Currently H&H stable and no further active bleeding.  Monitor.  4.  Acute metabolic encephalopathy from hypoglycemia From poor p.o. intake due to his n.p.o. status. Currently resolved.  5.  Type 2 diabetes mellitus with uncontrolled hypoglycemia and renal complication Blood sugar relatively stable for now. Continue sliding scale insulin.  Monitor.  6.  Paroxysmal A. Fib Essential hypertension Currently rate controlled. Continue amiodarone and Coreg. Initially anticoagulated with Eliquis currently on due to hematuria. Continue losartan and Coreg.  7.  CKD stage IIIa. Serum creatinine mildly elevated. Continue to monitor closely.  8.  UTI. Treated with Rocephin. Currently no symptoms.  9.  Hypothyroidism. TSH stable. Continue Synthroid.  10.  Mild hyperkalemia. Repeat potassium stable. Continue to monitor.  Diet: Cardiac  diet DVT Prophylaxis: SCD, pharmacological prophylaxis contraindicated due to Hematuria      Advance  goals of care discussion: Full code  Family Communication: no family was present at bedside, at the time of interview.  Discussed with daughter on the phone. The pt provided permission to discuss medical plan with the family. Opportunity was given to ask question and all questions were answered satisfactorily.   Disposition:  Status is: Inpatient  Remains inpatient appropriate because:Ongoing diagnostic testing needed not appropriate for outpatient work up and IV treatments appropriate due to intensity of illness or inability to take PO   Dispo: The patient is from: Home              Anticipated d/c is to: SNF              Anticipated d/c date is: 2 days              Patient currently is not medically stable to d/c.  Subjective: Reported some hematuria last night.  No nausea no vomiting.  Continues to have some shortness of breath and dry cough.  No fever no chills.  Reports some back pain.  Physical Exam:  General: Appear in mild distress, no Rash; Oral Mucosa Clear, moist. no Abnormal Neck Mass Or lumps, Conjunctiva normal  Cardiovascular: S1 and S2 Present, no Murmur, Respiratory: increased respiratory effort, Bilateral Air entry present and bilateral  Crackles, no wheezes Abdomen: Bowel Sound present, Soft and no tenderness Extremities: bilateral Pedal edema, no calf tenderness Neurology: alert and oriented to time, place, and person affect anxious. no new focal deficit Gait not checked due to patient safety concerns  Vitals:   12/18/19 0408 12/18/19 0500 12/18/19 0600 12/18/19 0641  BP: 108/64     Pulse: 79 78 77   Resp: 10 17 20 14   Temp: (!) 100.5 F (38.1 C)  98 F (36.7 C)   TempSrc: Oral  Oral   SpO2: 95% 92% (!) 80% 91%  Weight:      Height:        Intake/Output Summary (Last 24 hours) at 12/18/2019 1853 Last data filed at 12/18/2019 0600 Gross per 24 hour  Intake 5.8 ml  Output 1075 ml  Net -1069.2 ml   Filed Weights   12/14/19 1418 12/16/19 0900 12/17/19  0404  Weight: 63.5 kg 63.5 kg 66.2 kg    Data Reviewed: I have personally reviewed and interpreted daily labs, tele strips, imagings as discussed above. I reviewed all nursing notes, pharmacy notes, vitals, pertinent old records I have discussed plan of care as described above with RN and patient/family.  CBC: Recent Labs  Lab 12/14/19 1355 12/14/19 1355 12/15/19 0416 12/15/19 0522 12/16/19 0811 12/17/19 0312 12/18/19 0346  WBC 4.9  --   --  5.1 4.6 4.2 4.3  NEUTROABS  --   --   --  3.6 2.8 2.8 2.5  HGB 11.2*   < > 12.9 11.8* 11.8* 11.0* 12.9  HCT 37.1   < > 38.0 38.6 39.0 36.1 42.3  MCV 90.5  --   --  89.4 88.6 88.3 90.8  PLT 228  --   --  225 196 190 155   < > = values in this interval not displayed.   Basic Metabolic Panel: Recent Labs  Lab 12/15/19 0522 12/15/19 0522 12/16/19 02/15/20 12/17/19 0312 12/18/19 0346 12/18/19 0812 12/18/19 1235  NA 135  --  134* 134* 134* 135  --   K 4.0   < >  3.8 3.9 5.9* 4.2 4.3  CL 95*  --  94* 95* 93* 94*  --   CO2 29  --  27 28 28 28   --   GLUCOSE 366*  --  84 92 108* 102*  --   BUN 15  --  17 21 23  24*  --   CREATININE 1.31*  --  1.15* 1.31* 1.63* 1.59*  --   CALCIUM 8.7*  --  8.4* 8.0* 8.2* 7.8*  --   MG  --   --   --  1.7  --   --   --    < > = values in this interval not displayed.    Studies: No results found.  Scheduled Meds:  albuterol  2 puff Inhalation Q6H   amiodarone  100 mg Oral Daily   vitamin C  500 mg Oral Daily   aspirin  81 mg Oral Daily   cholecalciferol  1,000 Units Oral Daily   ezetimibe  10 mg Oral QHS   feeding supplement (ENSURE ENLIVE)  237 mL Oral Daily   insulin aspart  0-15 Units Subcutaneous TID WC   levothyroxine  50 mcg Oral Q0600   pantoprazole  40 mg Oral Q1200   simvastatin  40 mg Oral QHS   sodium chloride flush  3 mL Intravenous Q12H   sodium chloride flush  3 mL Intravenous Q12H   sodium chloride flush  3 mL Intravenous Q12H   spironolactone  12.5 mg Oral Daily    venlafaxine XR  150 mg Oral QHS   vitamin B-12  1,000 mcg Oral Daily   zinc sulfate  220 mg Oral Daily   Continuous Infusions:  sodium chloride     sodium chloride     sodium chloride 10 mL/hr at 12/18/19 0525   [START ON 12/19/2019] remdesivir 100 mg in NS 100 mL     PRN Meds: sodium chloride, sodium chloride, acetaminophen, guaiFENesin-dextromethorphan, nitroGLYCERIN, ondansetron (ZOFRAN) IV, sodium chloride flush, sodium chloride flush, traMADol  Time spent: 35 minutes  Author: 02/17/20, MD [facility] 12/18/2019 6:53 PM  To reach On-call, see care teams to locate the attending and reach out via [email or web address]. Between 7PM-7AM, please contact night-coverage If you still have difficulty reaching the attending provider, please page the [facility] (Director on Call) for [facility] on amion for assistance.

## 2019-12-19 ENCOUNTER — Inpatient Hospital Stay: Payer: Self-pay

## 2019-12-19 ENCOUNTER — Encounter (HOSPITAL_COMMUNITY)
Admission: EM | Disposition: A | Discharge status: Hospice | Payer: Self-pay | Source: Home / Self Care | Attending: Internal Medicine

## 2019-12-19 ENCOUNTER — Inpatient Hospital Stay (HOSPITAL_COMMUNITY): Payer: Medicare Other

## 2019-12-19 DIAGNOSIS — I25110 Atherosclerotic heart disease of native coronary artery with unstable angina pectoris: Secondary | ICD-10-CM

## 2019-12-19 DIAGNOSIS — I5043 Acute on chronic combined systolic (congestive) and diastolic (congestive) heart failure: Secondary | ICD-10-CM | POA: Diagnosis not present

## 2019-12-19 DIAGNOSIS — I509 Heart failure, unspecified: Secondary | ICD-10-CM | POA: Diagnosis not present

## 2019-12-19 DIAGNOSIS — I480 Paroxysmal atrial fibrillation: Secondary | ICD-10-CM

## 2019-12-19 DIAGNOSIS — U071 COVID-19: Secondary | ICD-10-CM

## 2019-12-19 DIAGNOSIS — I208 Other forms of angina pectoris: Secondary | ICD-10-CM

## 2019-12-19 DIAGNOSIS — I209 Angina pectoris, unspecified: Secondary | ICD-10-CM

## 2019-12-19 HISTORY — PX: RIGHT/LEFT HEART CATH AND CORONARY/GRAFT ANGIOGRAPHY: CATH118267

## 2019-12-19 HISTORY — PX: CORONARY STENT INTERVENTION: CATH118234

## 2019-12-19 LAB — COMPREHENSIVE METABOLIC PANEL
ALT: 17 U/L (ref 0–44)
AST: 43 U/L — ABNORMAL HIGH (ref 15–41)
Albumin: 2.8 g/dL — ABNORMAL LOW (ref 3.5–5.0)
Alkaline Phosphatase: 66 U/L (ref 38–126)
Anion gap: 14 (ref 5–15)
BUN: 29 mg/dL — ABNORMAL HIGH (ref 8–23)
CO2: 30 mmol/L (ref 22–32)
Calcium: 8.1 mg/dL — ABNORMAL LOW (ref 8.9–10.3)
Chloride: 94 mmol/L — ABNORMAL LOW (ref 98–111)
Creatinine, Ser: 1.62 mg/dL — ABNORMAL HIGH (ref 0.44–1.00)
GFR calc Af Amer: 36 mL/min — ABNORMAL LOW (ref >60)
GFR calc non Af Amer: 31 mL/min — ABNORMAL LOW (ref >60)
Glucose, Bld: 90 mg/dL (ref 70–99)
Potassium: 4.9 mmol/L (ref 3.5–5.1)
Sodium: 138 mmol/L (ref 135–145)
Total Bilirubin: 0.8 mg/dL (ref 0.3–1.2)
Total Protein: 6.2 g/dL — ABNORMAL LOW (ref 6.5–8.1)

## 2019-12-19 LAB — GLUCOSE, CAPILLARY
Glucose-Capillary: 134 mg/dL — ABNORMAL HIGH (ref 70–99)
Glucose-Capillary: 69 mg/dL — ABNORMAL LOW (ref 70–99)
Glucose-Capillary: 115 mg/dL — ABNORMAL HIGH (ref 70–99)
Glucose-Capillary: 227 mg/dL — ABNORMAL HIGH (ref 70–99)
Glucose-Capillary: 209 mg/dL — ABNORMAL HIGH (ref 70–99)
Glucose-Capillary: 174 mg/dL — ABNORMAL HIGH (ref 70–99)

## 2019-12-19 LAB — CBC WITH DIFFERENTIAL/PLATELET
Abs Immature Granulocytes: 0.04 10*3/uL (ref 0.00–0.07)
Basophils Absolute: 0 10*3/uL (ref 0.0–0.1)
Basophils Relative: 0 %
Eosinophils Absolute: 0 10*3/uL (ref 0.0–0.5)
Eosinophils Relative: 0 %
HCT: 39.1 % (ref 36.0–46.0)
Hemoglobin: 11.9 g/dL — ABNORMAL LOW (ref 12.0–15.0)
Immature Granulocytes: 1 %
Lymphocytes Relative: 30 %
Lymphs Abs: 1 10*3/uL (ref 0.7–4.0)
MCH: 27.3 pg (ref 26.0–34.0)
MCHC: 30.4 g/dL (ref 30.0–36.0)
MCV: 89.7 fL (ref 80.0–100.0)
Monocytes Absolute: 0.3 10*3/uL (ref 0.1–1.0)
Monocytes Relative: 10 %
Neutro Abs: 2 10*3/uL (ref 1.7–7.7)
Neutrophils Relative %: 59 %
Platelets: 128 10*3/uL — ABNORMAL LOW (ref 150–400)
RBC: 4.36 MIL/uL (ref 3.87–5.11)
RDW: 16 % — ABNORMAL HIGH (ref 11.5–15.5)
WBC: 3.3 10*3/uL — ABNORMAL LOW (ref 4.0–10.5)
nRBC: 0 % (ref 0.0–0.2)

## 2019-12-19 LAB — POCT I-STAT EG7
Acid-Base Excess: 5 mmol/L — ABNORMAL HIGH (ref 0.0–2.0)
Acid-Base Excess: 6 mmol/L — ABNORMAL HIGH (ref 0.0–2.0)
Bicarbonate: 31.3 mmol/L — ABNORMAL HIGH (ref 20.0–28.0)
Bicarbonate: 32.2 mmol/L — ABNORMAL HIGH (ref 20.0–28.0)
Calcium, Ion: 1.02 mmol/L — ABNORMAL LOW (ref 1.15–1.40)
Calcium, Ion: 1.06 mmol/L — ABNORMAL LOW (ref 1.15–1.40)
HCT: 37 % (ref 36.0–46.0)
HCT: 37 % (ref 36.0–46.0)
Hemoglobin: 12.6 g/dL (ref 12.0–15.0)
Hemoglobin: 12.6 g/dL (ref 12.0–15.0)
O2 Saturation: 52 %
O2 Saturation: 54 %
Potassium: 4 mmol/L (ref 3.5–5.1)
Potassium: 4.2 mmol/L (ref 3.5–5.1)
Sodium: 135 mmol/L (ref 135–145)
Sodium: 136 mmol/L (ref 135–145)
TCO2: 33 mmol/L — ABNORMAL HIGH (ref 22–32)
TCO2: 34 mmol/L — ABNORMAL HIGH (ref 22–32)
pCO2, Ven: 52.9 mmHg (ref 44.0–60.0)
pCO2, Ven: 53.9 mmHg (ref 44.0–60.0)
pH, Ven: 7.38 (ref 7.250–7.430)
pH, Ven: 7.384 (ref 7.250–7.430)
pO2, Ven: 29 mmHg — CL (ref 32.0–45.0)
pO2, Ven: 30 mmHg — CL (ref 32.0–45.0)

## 2019-12-19 LAB — BASIC METABOLIC PANEL
Anion gap: 7 (ref 5–15)
BUN: 30 mg/dL — ABNORMAL HIGH (ref 8–23)
CO2: 30 mmol/L (ref 22–32)
Calcium: 7.7 mg/dL — ABNORMAL LOW (ref 8.9–10.3)
Chloride: 95 mmol/L — ABNORMAL LOW (ref 98–111)
Creatinine, Ser: 1.41 mg/dL — ABNORMAL HIGH (ref 0.44–1.00)
GFR calc Af Amer: 42 mL/min — ABNORMAL LOW (ref >60)
GFR calc non Af Amer: 36 mL/min — ABNORMAL LOW (ref >60)
Glucose, Bld: 180 mg/dL — ABNORMAL HIGH (ref 70–99)
Potassium: 4 mmol/L (ref 3.5–5.1)
Sodium: 132 mmol/L — ABNORMAL LOW (ref 135–145)

## 2019-12-19 LAB — CBC
HCT: 38.2 % (ref 36.0–46.0)
Hemoglobin: 11.5 g/dL — ABNORMAL LOW (ref 12.0–15.0)
MCH: 27.1 pg (ref 26.0–34.0)
MCHC: 30.1 g/dL (ref 30.0–36.0)
MCV: 89.9 fL (ref 80.0–100.0)
Platelets: 126 10*3/uL — ABNORMAL LOW (ref 150–400)
RBC: 4.25 MIL/uL (ref 3.87–5.11)
RDW: 15.9 % — ABNORMAL HIGH (ref 11.5–15.5)
WBC: 1.6 10*3/uL — ABNORMAL LOW (ref 4.0–10.5)
nRBC: 0 % (ref 0.0–0.2)

## 2019-12-19 LAB — FERRITIN: Ferritin: 226 ng/mL (ref 11–307)

## 2019-12-19 LAB — POCT ACTIVATED CLOTTING TIME
Activated Clotting Time: 252 seconds
Activated Clotting Time: 285 seconds
Activated Clotting Time: 279 seconds

## 2019-12-19 LAB — C-REACTIVE PROTEIN: CRP: 4.4 mg/dL — ABNORMAL HIGH (ref <1.0)

## 2019-12-19 LAB — D-DIMER, QUANTITATIVE: D-Dimer, Quant: 0.86 ug/mL-FEU — ABNORMAL HIGH (ref 0.00–0.50)

## 2019-12-19 SURGERY — RIGHT/LEFT HEART CATH AND CORONARY/GRAFT ANGIOGRAPHY
Anesthesia: LOCAL

## 2019-12-19 MED ORDER — SODIUM CHLORIDE 0.9% FLUSH
10.0000 mL | INTRAVENOUS | Status: DC | PRN
  Administered 2019-12-20 – 2019-12-23 (×4): 10 mL

## 2019-12-19 MED ORDER — ASPIRIN 81 MG PO CHEW
81.0000 mg | CHEWABLE_TABLET | ORAL | Status: AC
  Administered 2019-12-19: 81 mg via ORAL
  Filled 2019-12-19: qty 1

## 2019-12-19 MED ORDER — MIDAZOLAM HCL 2 MG/2ML IJ SOLN
INTRAMUSCULAR | Status: DC | PRN
  Administered 2019-12-19: 1 mg via INTRAVENOUS

## 2019-12-19 MED ORDER — HEPARIN (PORCINE) IN NACL 1000-0.9 UT/500ML-% IV SOLN
INTRAVENOUS | Status: DC | PRN
  Administered 2019-12-19 (×2): 500 mL

## 2019-12-19 MED ORDER — CHLORHEXIDINE GLUCONATE CLOTH 2 % EX PADS
6.0000 | MEDICATED_PAD | Freq: Every day | CUTANEOUS | Status: DC
  Administered 2019-12-19 – 2019-12-23 (×4): 6 via TOPICAL

## 2019-12-19 MED ORDER — IOHEXOL 350 MG/ML SOLN
INTRAVENOUS | Status: DC | PRN
  Administered 2019-12-19: 145 mL via INTRA_ARTERIAL

## 2019-12-19 MED ORDER — CLOPIDOGREL BISULFATE 300 MG PO TABS
ORAL_TABLET | ORAL | Status: AC
  Filled 2019-12-19: qty 2

## 2019-12-19 MED ORDER — DEXAMETHASONE SODIUM PHOSPHATE 10 MG/ML IJ SOLN
6.0000 mg | Freq: Every day | INTRAMUSCULAR | Status: DC
  Administered 2019-12-19 – 2019-12-20 (×2): 6 mg via INTRAVENOUS
  Filled 2019-12-19 (×2): qty 0.6

## 2019-12-19 MED ORDER — HEPARIN SODIUM (PORCINE) 1000 UNIT/ML IJ SOLN
INTRAMUSCULAR | Status: DC | PRN
  Administered 2019-12-19: 6000 [IU] via INTRAVENOUS
  Administered 2019-12-19: 3000 [IU] via INTRAVENOUS

## 2019-12-19 MED ORDER — SODIUM CHLORIDE 0.9 % IV SOLN: 250.0000 mL | INTRAVENOUS | Status: DC | PRN

## 2019-12-19 MED ORDER — HYDRALAZINE HCL 20 MG/ML IJ SOLN: 10.0000 mg | INTRAMUSCULAR | Status: AC | PRN

## 2019-12-19 MED ORDER — LIDOCAINE HCL (PF) 1 % IJ SOLN
INTRAMUSCULAR | Status: DC | PRN
  Administered 2019-12-19: 10 mL via SUBCUTANEOUS

## 2019-12-19 MED ORDER — APIXABAN 5 MG PO TABS
5.0000 mg | ORAL_TABLET | Freq: Two times a day (BID) | ORAL | Status: DC
  Administered 2019-12-20 – 2019-12-21 (×3): 5 mg via ORAL
  Filled 2019-12-19 (×3): qty 1

## 2019-12-19 MED ORDER — SODIUM CHLORIDE 0.9% FLUSH
10.0000 mL | Freq: Two times a day (BID) | INTRAVENOUS | Status: DC
  Administered 2019-12-19 – 2019-12-20 (×2): 10 mL
  Administered 2019-12-20: 20 mL
  Administered 2019-12-21: 10 mL
  Administered 2019-12-21: 20 mL
  Administered 2019-12-22 – 2019-12-23 (×4): 10 mL

## 2019-12-19 MED ORDER — CLOPIDOGREL BISULFATE 75 MG PO TABS
75.0000 mg | ORAL_TABLET | Freq: Every day | ORAL | Status: DC
  Administered 2019-12-20 – 2019-12-21 (×2): 75 mg via ORAL
  Filled 2019-12-19 (×2): qty 1

## 2019-12-19 MED ORDER — CLOPIDOGREL BISULFATE 300 MG PO TABS
ORAL_TABLET | ORAL | Status: AC
  Filled 2019-12-19: qty 1

## 2019-12-19 MED ORDER — CLOPIDOGREL BISULFATE 300 MG PO TABS
ORAL_TABLET | ORAL | Status: DC | PRN
  Administered 2019-12-19: 600 mg via ORAL

## 2019-12-19 MED ORDER — LABETALOL HCL 5 MG/ML IV SOLN: 10.0000 mg | INTRAVENOUS | Status: AC | PRN

## 2019-12-19 MED ORDER — HEPARIN (PORCINE) IN NACL 1000-0.9 UT/500ML-% IV SOLN
INTRAVENOUS | Status: AC
  Filled 2019-12-19: qty 1000

## 2019-12-19 MED ORDER — LIDOCAINE HCL (PF) 1 % IJ SOLN
INTRAMUSCULAR | Status: AC
  Filled 2019-12-19: qty 30

## 2019-12-19 MED ORDER — SODIUM CHLORIDE 0.9% FLUSH: 3.0000 mL | INTRAVENOUS | Status: DC | PRN

## 2019-12-19 MED ORDER — SODIUM CHLORIDE 0.9 % IV SOLN: INTRAVENOUS | Status: DC

## 2019-12-19 MED ORDER — ATROPINE SULFATE 1 MG/10ML IJ SOSY
PREFILLED_SYRINGE | INTRAMUSCULAR | Status: AC
  Filled 2019-12-19: qty 10

## 2019-12-19 MED ORDER — FENTANYL CITRATE (PF) 100 MCG/2ML IJ SOLN
INTRAMUSCULAR | Status: AC
  Filled 2019-12-19: qty 2

## 2019-12-19 MED ORDER — ALBUTEROL SULFATE HFA 108 (90 BASE) MCG/ACT IN AERS
2.0000 | INHALATION_SPRAY | Freq: Two times a day (BID) | RESPIRATORY_TRACT | Status: DC
  Administered 2019-12-19: 2 via RESPIRATORY_TRACT

## 2019-12-19 MED ORDER — MIDAZOLAM HCL 2 MG/2ML IJ SOLN
INTRAMUSCULAR | Status: AC
  Filled 2019-12-19: qty 2

## 2019-12-19 MED ORDER — HEPARIN SODIUM (PORCINE) 1000 UNIT/ML IJ SOLN
INTRAMUSCULAR | Status: AC
  Filled 2019-12-19: qty 1

## 2019-12-19 MED ORDER — FENTANYL CITRATE (PF) 100 MCG/2ML IJ SOLN
INTRAMUSCULAR | Status: DC | PRN
  Administered 2019-12-19: 25 ug via INTRAVENOUS

## 2019-12-19 MED ORDER — SODIUM CHLORIDE 0.9% FLUSH
3.0000 mL | INTRAVENOUS | Status: DC | PRN
  Administered 2019-12-20: 3 mL via INTRAVENOUS

## 2019-12-19 MED ORDER — MILRINONE LACTATE IN DEXTROSE 20-5 MG/100ML-% IV SOLN
0.2500 ug/kg/min | INTRAVENOUS | Status: DC
  Administered 2019-12-19 – 2019-12-21 (×3): 0.25 ug/kg/min via INTRAVENOUS
  Filled 2019-12-19 (×3): qty 100

## 2019-12-19 MED ORDER — IOHEXOL 350 MG/ML SOLN
INTRAVENOUS | Status: AC
  Filled 2019-12-19: qty 1

## 2019-12-19 MED ORDER — SODIUM CHLORIDE 0.9% FLUSH
3.0000 mL | Freq: Two times a day (BID) | INTRAVENOUS | Status: DC
  Administered 2019-12-20 – 2019-12-21 (×2): 3 mL via INTRAVENOUS

## 2019-12-19 MED ORDER — NITROGLYCERIN 1 MG/10 ML FOR IR/CATH LAB
INTRA_ARTERIAL | Status: AC
  Filled 2019-12-19: qty 10

## 2019-12-19 SURGICAL SUPPLY — 24 items
BALLN SAPPHIRE 2.0X10 (BALLOONS) ×2
BALLN SAPPHIRE ~~LOC~~ 3.0X12 (BALLOONS) ×1 IMPLANT
BALLN WOLVERINE 2.50X10 (BALLOONS) ×2
BALLOON SAPPHIRE 2.0X10 (BALLOONS) IMPLANT
BALLOON WOLVERINE 2.50X10 (BALLOONS) IMPLANT
CATH INFINITI 5 FR JL3.5 (CATHETERS) ×1 IMPLANT
CATH INFINITI 5FR MULTPACK ANG (CATHETERS) ×1 IMPLANT
CATH LAUNCHER 6FR JR4 (CATHETERS) ×1 IMPLANT
CATH SWAN GANZ 7F STRAIGHT (CATHETERS) ×1 IMPLANT
GUIDEWIRE ANGLED .035X150CM (WIRE) ×1 IMPLANT
KIT ENCORE 26 ADVANTAGE (KITS) ×1 IMPLANT
KIT HEART LEFT (KITS) ×3 IMPLANT
PACK CARDIAC CATHETERIZATION (CUSTOM PROCEDURE TRAY) ×3 IMPLANT
SHEATH PINNACLE 5F 10CM (SHEATH) ×1 IMPLANT
SHEATH PINNACLE 6F 10CM (SHEATH) ×1 IMPLANT
SHEATH PINNACLE 7F 10CM (SHEATH) ×1 IMPLANT
SHEATH PROBE COVER 6X72 (BAG) ×1 IMPLANT
STENT RESOLUTE ONYX 2.0X12 (Permanent Stent) ×1 IMPLANT
TRANSDUCER W/STOPCOCK (MISCELLANEOUS) ×3 IMPLANT
TUBING CIL FLEX 10 FLL-RA (TUBING) ×1 IMPLANT
WIRE ASAHI PROWATER 180CM (WIRE) ×1 IMPLANT
WIRE EMERALD 3MM-J .025X260CM (WIRE) ×1 IMPLANT
WIRE EMERALD 3MM-J .035X150CM (WIRE) ×1 IMPLANT
WIRE EMERALD 3MM-J .035X260CM (WIRE) ×1 IMPLANT

## 2019-12-19 NOTE — Brief Op Note (Addendum)
12/19/2019;  5:13 PM  PATIENT:  [name]  74 y.o. female with history of known CAD-CABG times 08/2006 (NSTEMI in January 2019-cath showed occluded SVG-PDA with serial 70 and 90% calcified RCA lesions treated with staged atherectomy PCI and DES x2) who presented to [facility] with worsening dyspnea and cough, found to have Covid positive COVID-19, but was suspected that most of her symptoms are related to acute on chronic combined CHF-EF 15 to 20% with global hypokinesis).  She has been stabilized medically with diuresis and Covid treatment.  Initial plans for right left heart cath were delayed due to worsening renal function and that was scheduled for today.  Diagnostic Right Left Heart Cath performed by Dr. Aundra Dubin --> Interventional Cardiology Consultation called to discuss RCA lesions plan PCI.  PRE-OPERATIVE DIAGNOSIS: Acute on chronic combined CHF  Known occlusion of SVG-RCA status post atherectomy PCI of the proximal to mid RCA with placement of 2.7 mm DES stent --> at the original significant lesion site in the mid RCA there is a short 60% in-stent restenosis followed by focal in-stent restenosis segment of 80%.  Downstream the RPDA, RP AV, RPL 1 and distal RPL V are relatively free of disease, but the proximal RPL 2 has a focal 90% lesion that is likely the culprit lesion.  POST-OPERATIVE DIAGNOSIS:    Successful DES PCI of 90% proximal RPL 2 stenosis reduced to 0% using Resolute Onyx DES 2.0 mm at 12 mm postdilated to 2.2 mm  Successful 2.5 mm Wolverine scoring balloon and high-pressure 3.0 mm Hewitt balloon PTCA of distal half of the stented mid RCA segment reducing to 0-5%  PROCEDURE:  Procedure(s): RIGHT/LEFT HEART CATH AND CORONARY/GRAFT ANGIOGRAPHY (N/A) CORONARY STENT INTERVENTION (N/A)  SURGEON:  Surgeon(s) and Role: Panel 1:    * Larey Dresser, MD - Primary Panel 2:    * Leonie Man, MD - Primary  ANESTHESIA:   IV sedation -> 1 mg Versed -25 mcg fentanyl  given by Dr. Aundra Dubin.  No additional sedation given for PCI.  EBL:  <20 mL  Time Out: Verified patient identification, verified procedure, site/side was marked, verified correct patient position, special equipment/implants available, medications/allergies/relevent history reviewed, required imaging and test results available. Performed.  Access: Performed by Dr. Aundra Dubin . *Right Common Femoral Artery: 5 Fr Sheath -direct ultrasound, and fluoroscopically guided modified Seldinger Technique  . *Right Common Femoral Vein: 7 Fr Sheath - Seldinger Technique.--Direct ultrasound, and fluoroscopically guided Seldinger technique  Right Heart Catheterization: Performed by Dr. Aundra Dubin  7 Fr Gordy Councilman catheter advanced under fluoroscopy with balloon inflated to the RA, RV, then PCWP-PA for hemodynamic measurement.  . Data collected for Dr. Aundra Dubin, see separate note  Left Heart Catheterization: Performed by Dr. Aundra Dubin  Standard Judkins right and left  5 Fr Catheters advanced or exchanged over a J-wire under direct fluoroscopic guidance into the ascending aorta; JL4 catheter advanced first.  . * LV Hemodynamics (LV Gram): JR4 guide during PCI, no LV gram . * Left and Right coronary Artery Cineangiography: Standard Judkins catheters, . SVG-RCA & SVG-OM Cineangiography: JR4 catheter  . * LIMA-LAD Cineangiography: Not performed, unable to reach the LIMA due to right aortic archr.  Upon completion of Angiogaphy, the catheter was removed completely out of the body over a wire, without complication.  Consultation to Interventional Cardiology: Review of diagnostic images with Dr. Almon Register revealed to target segments in the RCA-RPL distribution: Distal portion of the stented segment of the RCA had  a severe 80% stenosis (in-stent restenosis) at the original pre-PCI stenotic site.  There is also a 90% proximal RPL 2 lesion and a relatively significant branch.  I discussed the images with Dr. Aundra Dubin, we decided to  proceed with PCI of the RPL 2 lesion and scoring balloon PTCA of the stented segment of the RCA.  RCA-RPL 2 PCI:  Heparin IV 6000 units administered followed by another 3000 units to maintain ACT btw 275 & 300 sec; clopidogrel 60 mg administered p.o.  5 French sheath exchanged for 6 French sheath in the RFA  PCI: 6 [place] JR4 guide catheter (initially advanced across aortic valve for hemodynamics) was used to engage the RCA.  Once a stable ACT was achieved, a Prowater wire was advanced across the lesion in the RPL2 RPL 2: 90% lesion reduced to 0%  A 2.0 mm x 72m compliant balloon was used to predilate the RPL2 lesion (after initial attempt to advance the stent failed, the balloon was reinserted for additional PTCA -10 ATM x26 sec)  A 2.0 mm x 12 mm Resolute Onyx DES stent was placed just distal to the ostium of the RPL2 and deployed at 16 ATM x30 sec --> the stent balloon was then used to post dilate to 18 ATM for 20 seconds (2.2 mm) Mid RCA ISR: 80 % in-stent restenosis lesion reduced to 0-5 %  With the wire already in place, first the 2.0 mm X  12 mm stent balloon was used to predilate the most focal lesion--12 ATM x30 seconds  Next a 2.5 mm x 10 mm Wolverine scoring balloon was inflated in 3 separate inflations to 10 and 12 ATM for 30 seconds each  The target area was then postdilated using a 3.0 mm x 12 mm Ridott balloon inflated to 18 ATM x30 seconds ranging from the distal stent edge up to the proximal third of the stented segment. --  MEDICATIONS * Isovue Contrast: 45 mL contrast used for diagnostic, additional 100 mL for PCI * Heparin: Total of 9000 units * Oral clopidogrel 60 mg x 1  COUNTS:  YES  DICTATION: .Note written in EPIC  PLAN OF CARE: Transfer back to nursing unit for ongoing care; per Dr. MAundra Dubin plan will be to start milrinone infusion.  She was loaded with clopidogrel 600 mg pre-PCI, will start 75 mg daily.  Is currently on aspirin and Eliquis, okay to stop  aspirin if necessary for bleeding concerns, but restart Eliquis tomorrow morning  PATIENT DISPOSITION:  PACU - hemodynamically stable.   Delay start of Pharmacological VTE agent (>24hrs) due to surgical blood loss or risk of bleeding: not applicable   DGlenetta Hew M.D., M.S. Interventional Cardiologist   Pager # [phone] # [phone] [address] [place] [id number]

## 2019-12-19 NOTE — H&P (View-Only) (Signed)
Patient ID: [name], female   DOB: 09/07/1945, 73 y.o.   MRN: [id number]     Advanced Heart Failure Rounding Note  PCP-Cardiologist: Kenneth C Hilty, MD   Subjective:    Afebrile today, creatinine down to 1.6.  Feeling better. No chest pain.   RHC/LHC today:  Coronary Findings  Diagnostic Dominance: Right Left Main  Short, no severe disease.  Left Anterior Descending  95% mid LAD with competitive flow from LIMA. LIMA not engaged => right-sided aortic arch, unable to cannulate left subclavian (same problem on prior cath). Radial artery not used given very weak/thready pulse and small arm.  Left Circumflex  Diffusely disease proximally up to 95%. SVG-OM1 and SVG-OM2 patent.  Right Coronary Artery  Known occlusion of the SVG-RCA. Long stented segment in proximal to mid RCA. 80% in-stent restenosis in the mid RCA. 99% stenosis mid PLV branch (possible culprit for unstable angina).  Intervention  No interventions have been documented. Right Heart  Right Heart Pressures RHC Procedural Findings: Hemodynamics (mmHg) RA 9 RV 37/10 PA 43/12, mean 25 PCWP mean 19 LV 96/20 AO 97/52  Oxygen saturations: PA 53% AO 98%  Cardiac Output (Fick) 3.05  Cardiac Index (Fick) 1.83  Cardiac Output (thermo) 2.48 Cardiac Index (thermo) 1.49      Objective:   Weight Range: 70.5 kg Body mass index is 28.43 kg/m.   Vital Signs:   Temp:  [97.6 F (36.4 C)-100.7 F (38.2 C)] 98.7 F (37.1 C) (08/13 0800) Pulse Rate:  [69-75] 74 (08/13 0800) Resp:  [19-21] 20 (08/13 0800) BP: (101-116)/(62-83) 116/69 (08/13 0800) SpO2:  [90 %-95 %] 95 % (08/13 0800) Weight:  [70.5 kg] 70.5 kg (08/13 0500) Last BM Date: 12/18/19  Weight change: Filed Weights   12/16/19 0900 12/17/19 0404 12/19/19 0500  Weight: 63.5 kg 66.2 kg 70.5 kg    Intake/Output:   Intake/Output Summary (Last 24 hours) at 12/19/2019 1626 Last data filed at 12/18/2019 2359 Gross per 24 hour  Intake 637.81 ml    Output 200 ml  Net 437.81 ml      Physical Exam    General: NAD Neck: JVP 8-9 cm, no thyromegaly or thyroid nodule.  Lungs: Mild crackles at bases.  CV: Nondisplaced PMI.  Heart regular S1/S2, no S3/S4, no murmur.  No peripheral edema.  Abdomen: Soft, nontender, no hepatosplenomegaly, no distention.  Skin: Intact without lesions or rashes.  Neurologic: Alert and oriented x 3.  Psych: Normal affect. Extremities: No clubbing or cyanosis.  HEENT: Normal.    Telemetry   NSR, personally reviewed.   Labs    CBC Recent Labs    12/18/19 0346 12/18/19 0346 12/18/19 1829 12/19/19 0348  WBC 4.3   < > 4.4 3.3*  NEUTROABS 2.5  --   --  2.0  HGB 12.9   < > 12.6 11.9*  HCT 42.3   < > 42.8 39.1  MCV 90.8   < > 90.1 89.7  PLT 155   < > 144* 128*   < > = values in this interval not displayed.   Basic Metabolic Panel Recent Labs    12/17/19 0312 12/18/19 0346 12/18/19 1829 12/19/19 0348  NA 134*   < > 132* 138  K 3.9   < > 4.6 4.9  CL 95*   < > 93* 94*  CO2 28   < > 28 30  GLUCOSE 92   < > 140* 90  BUN 21   < > 29* 29*  CREATININE   1.31*   < > 1.79* 1.62*  CALCIUM 8.0*   < > 8.1* 8.1*  MG 1.7  --   --   --    < > = values in this interval not displayed.   Liver Function Tests Recent Labs    12/18/19 0346 12/19/19 0348  AST 40 43*  ALT 16 17  ALKPHOS 58 66  BILITOT 0.8 0.8  PROT 6.1* 6.2*  ALBUMIN 2.9* 2.8*   No results for input(s): LIPASE, AMYLASE in the last 72 hours. Cardiac Enzymes No results for input(s): CKTOTAL, CKMB, CKMBINDEX, TROPONINI in the last 72 hours.  BNP: BNP (last 3 results) Recent Labs    12/14/19 1355 12/14/19 2302 12/17/19 0255  BNP 4,206.6* 3,317.2* 1,726.9*    ProBNP (last 3 results) No results for input(s): PROBNP in the last 8760 hours.   D-Dimer Recent Labs    12/18/19 0346 12/19/19 0348  DDIMER 0.93* 0.86*   Hemoglobin A1C No results for input(s): HGBA1C in the last 72 hours. Fasting Lipid Panel Recent Labs     12/17/19 0312  CHOL 56  HDL 26*  LDLCALC 21  TRIG 44  CHOLHDL 2.2   Thyroid Function Tests No results for input(s): TSH, T4TOTAL, T3FREE, THYROIDAB in the last 72 hours.  Invalid input(s): FREET3  Other results:   Imaging    CARDIAC CATHETERIZATION  Result Date: 12/19/2019 1. Mildly elevated right and left heart filling pressures. 2. Low cardiac output. 3. Stable anatomy from prior except for 80% mRCA stenosis and likely culprit 99% mid PLV stenosis. Discussed with Dr. Harding, plan intervention to PLV and mRCA.  Will start on milrinone 0.25 mcg/kg/min for now post-PCI to optimize cardiac output.  Will give very gentle IV fluid post-procedure today, will likely need some diuresis tomorrow.  Will place PICC.   CT RENAL STONE STUDY  Result Date: 12/18/2019 CLINICAL DATA:  Hematuria EXAM: CT ABDOMEN AND PELVIS WITHOUT CONTRAST TECHNIQUE: Multidetector CT imaging of the abdomen and pelvis was performed following the standard protocol without IV contrast. COMPARISON:  CT abdomen and pelvis 02/20/2009, chest radiograph 12/16/2019, CTA chest 05/10/2017 FINDINGS: Lower chest: Moderate bilateral pleural effusions with adjacent areas of passive atelectasis. Some additional multifocal areas patchy ground-[place] and consolidative opacity present throughout the lung bases. Additional perifissural nodule measuring 6 mm in the right middle lobe along the major fissure (4/10). Cardiomegaly with pacer lead at the cardiac apex. Trace pericardial effusion. Extensive coronary artery calcifications. Hepatobiliary: No visible focal liver lesions on this unenhanced CT. Smooth liver surface contour. Normal liver attenuation. Mild periportal edema. Gallbladder is mildly distended containing dependently layering hypoattenuating and calcified material, possibly a mixture of calcified and noncalcified gallstones. No pericholecystic fluid or inflammation nor significant gallbladder wall thickening is present. No biliary  ductal dilatation. Pancreas: Unremarkable. No pancreatic ductal dilatation or surrounding inflammatory changes. Spleen: Extensive vascular coil packs seen in the left upper quadrant with associated streak artifact. Heterogeneous appearance of the splenic parenchyma may reflect sequela of prior splenic embolization. Adrenals/Urinary Tract: Normal adrenal glands. Kidneys symmetrically located. No visible concerning renal lesion. Extensive vascular calcifications without urolithiasis or hydronephrosis. Circumferential bladder wall thickening with faint perivesicular hazy stranding. Stomach/Bowel: Distal esophagus, stomach and duodenal sweep are unremarkable. No small bowel wall thickening or dilatation. No evidence of obstruction. A normal appendix is visualized. No colonic dilatation or wall thickening. Pancolonic diverticula without focal inflammation to suggest diverticulitis. Vascular/Lymphatic: Atherosclerotic calcifications within the abdominal aorta and branch vessels. No aneurysm or ectasia. No enlarged abdominopelvic lymph   nodes. Reproductive: Uterus is surgically absent. No concerning adnexal lesions. Other: Moderate body wall edema most pronounced over the flanks and posterior soft tissues. Small volume perihepatic ascites as well as trace free fluid in the deep pelvis. No abdominopelvic free air. No bowel containing hernias. Musculoskeletal: Diffuse osseous demineralization, similar to comparison. There are remote appearing posttraumatic deformities of the right inferior and superior pubic ramus, left SI joint, and multiple lateral left ribs. No convincing acute fracture or suspicious osseous lesions. IMPRESSION: 1. Circumferential bladder wall thickening with faint perivesicular hazy stranding, suggestive of cystitis. Correlate with urinalysis. No urolithiasis or hydronephrosis. 2. Features suggestive of CHF and anasarca with moderate bilateral effusions, periportal edema, small volume ascites, and  circumferential body wall edema. 3. Cholelithiasis without evidence of acute cholecystitis. 4. Pancolonic diverticula without evidence of diverticulitis. 5. Remote appearing posttraumatic deformities of the right inferior and superior pubic ramus, left SI joint, and multiple lateral left ribs. 6. Aortic Atherosclerosis (ICD10-I70.0). Electronically Signed   By: Price  DeHay M.D.   On: 12/18/2019 23:22   US EKG SITE RITE  Result Date: 12/19/2019 If Site Rite image not attached, placement could not be confirmed due to current cardiac rhythm.    Medications:     Scheduled Medications: . [MAR Hold] albuterol  2 puff Inhalation BID  . [MAR Hold] amiodarone  100 mg Oral Daily  . [MAR Hold] vitamin C  500 mg Oral Daily  . [MAR Hold] aspirin  81 mg Oral Daily  . [MAR Hold] cholecalciferol  1,000 Units Oral Daily  . [MAR Hold] dexamethasone (DECADRON) injection  6 mg Intravenous Daily  . [MAR Hold] ezetimibe  10 mg Oral QHS  . [MAR Hold] feeding supplement (ENSURE ENLIVE)  237 mL Oral Daily  . [MAR Hold] insulin aspart  0-15 Units Subcutaneous TID WC  . [MAR Hold] levothyroxine  50 mcg Oral Q0600  . [MAR Hold] pantoprazole  40 mg Oral Q1200  . [MAR Hold] simvastatin  40 mg Oral QHS  . [MAR Hold] sodium chloride flush  3 mL Intravenous Q12H  . [MAR Hold] sodium chloride flush  3 mL Intravenous Q12H  . [MAR Hold] sodium chloride flush  3 mL Intravenous Q12H  . [MAR Hold] spironolactone  12.5 mg Oral Daily  . [MAR Hold] venlafaxine XR  150 mg Oral QHS  . [MAR Hold] vitamin B-12  1,000 mcg Oral Daily  . [MAR Hold] zinc sulfate  220 mg Oral Daily    Infusions: . sodium chloride    . sodium chloride    . sodium chloride 10 mL/hr at 12/18/19 0525  . sodium chloride    . [START ON 12/20/2019] sodium chloride    . milrinone    . [MAR Hold] remdesivir 100 mg in NS 100 mL 100 mg (12/19/19 0857)    PRN Medications: sodium chloride, sodium chloride, sodium chloride, [MAR Hold] acetaminophen,  clopidogrel, fentaNYL, [MAR Hold] guaiFENesin-dextromethorphan, Heparin (Porcine) in NaCl, heparin sodium (porcine), lidocaine (PF), midazolam, [MAR Hold] nitroGLYCERIN, [MAR Hold] ondansetron (ZOFRAN) IV, sodium chloride flush, sodium chloride flush, sodium chloride flush, [MAR Hold] traMADol    Assessment/Plan   1. COVID-19 infection: Only got 1 dose of Pfizer vaccine, missed 2nd dose. Suspect main issues here is CHF, not COVID PNA. However, she has had periodic fevers and was started on remdesivir.  CXR with pulmonary edema, possible LLL infiltrate.  - Remdesivir per [facility].  2. Acute on chronic systolic CHF: History of ischemic cardiomyopathy.  MDT ICD.  Echo this admission   with fall in EF to 15-20%, global hypokinesis, moderate LV dilation, severely decreased RV function, severe MR, moderate-severe TR.  RHC today with mildly elevated filling pressures, cardiac output low.  Creatinine lower at 1.6.  - Will start on milrinone 0.25 mcg/kg/min for now post-PCI to optimize cardiac output.  Will give very gentle IV fluid post-cath today given contrast, will likely need some diuresis tomorrow.  Will place PICC for CVP and co-ox.  - Hold losartan with elevated creatinine.  - Continue spironolactone 12.5 daily.  - Patient has LBBB but probably not wide enough (129 msec) to benefit much from CRT.  - With frailty, think she would not be a good candidate for advanced therapies.  3. CAD: CABG x 4 2008. NSTEMI 05/2017>>LHC w/ occluded SVG-PDA and serial 70% and 90% stenoses in the proximal to mid RCA, treated w/ DES x 2 to RCA.  HS troponin this admit low level and flat, 23>>23>>36. Chest tightness on and off but now resolved, EF is lower on echo this admission.  Concern for unstable angina/small NSTEMI, coronary angiography today with stable anatomy except for 80% ISR mid RCA, 99% mid PLV stenosis (likely culprit).  - PCI today to RCA with Dr. Harding.  - Will need Plavix and a short course of ASA 81  post-procedure.  Careful given PAF and need for Eliquis.  - Continue statin, good LDL 21.  4. UTI: Treating with ceftriaxone.  5. Type 2 diabetes: Would benefit from dapagliflozin eventually.  6. 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  7. Atrial fibrillation: Paroxysmal.  NSR today.  She is on amiodarone.  - Continue amiodarone.  - Will need to restart Eliquis eventually, likely tomorrow morning if stable.   Length of Stay: 4  Aki Abalos, MD  12/19/2019, 4:26 PM  Advanced Heart Failure Team Pager [phone] (M-F; 7a - 4p)  Please contact [facility] for night-coverage after hours (4p -7a ) and weekends on [email or web address]  

## 2019-12-19 NOTE — Progress Notes (Signed)
Peripherally Inserted Central Catheter Placement  The IV Nurse has discussed with the patient and/or persons authorized to consent for the patient, the purpose of this procedure and the potential benefits and risks involved with this procedure.  The benefits include less needle sticks, lab draws from the catheter, and the patient may be discharged home with the catheter. Risks include, but not limited to, infection, bleeding, blood clot (thrombus formation), and puncture of an artery; nerve damage and irregular heartbeat and possibility to perform a PICC exchange if needed/ordered by physician.  Alternatives to this procedure were also discussed.  Bard Power PICC patient education guide, fact sheet on infection prevention and patient information card has been provided to patient /or left at bedside.  Telephone consent obtained from daughter due to patient being given sedatives during cardiac cath, explained procedure to the patient prior to performing.  Patient aware that daughter gave consent.   PICC Placement Documentation  PICC Double Lumen 12/19/19 PICC Right Basilic 37 cm 1 cm (Active)  Indication for Insertion or Continuance of Line Vasoactive infusions;Chronic illness with exacerbations (CF, Sickle Cell, etc.);Prolonged intravenous therapies 12/19/19 1915  Exposed Catheter (cm) 1 cm 12/19/19 1915  Site Assessment Clean;Dry;Intact 12/19/19 1915  Lumen #1 Status Flushed;Saline locked;Blood return noted 12/19/19 1915  Lumen #2 Status Flushed;Saline locked;Blood return noted 12/19/19 1915  Dressing Type Transparent 12/19/19 1915  Dressing Status Clean;Dry;Intact;Antimicrobial disc in place 12/19/19 1915  Dressing Intervention New dressing 12/19/19 1915  Dressing Change Due 12/26/19 12/19/19 1915       [name], [name] 12/19/2019, 7:29 PM

## 2019-12-19 NOTE — Progress Notes (Signed)
[facility] Progress Note  Patient: [name]    FAO:[id number]  DOA: 12/14/2019     Date of Service: the patient was seen and examined on 12/19/2019  Brief hospital course: Past medical history of chronic systolic CHF, SP AICD implant, PAF on Eliquis, type II DM, partially vaccinated.  Presents with shortness of breath and swelling in the leg with chest pain found to have acute decompensated systolic CHF.  Tested positive for Covid.  Started having fever on 8/11//2021.  Currently plan is continue treating Covid and further work-up for CHF per cardiology.  Assessment and Plan: 1.  Acute hypoxic respiratory failure Acute on chronic systolic CHF SP AICD placement. History of CAD Chest pain/angina Presents with shortness of breath and cough.  Also had chest pain. Found to have significant volume overload consistent with decompensated systolic CHF. Cardiology was consulted. Patient was started on aggressive IV diuresis. Currently volume significantly better. Oxygenation stable. Underwent left heart cath with DES placement on 12/19/2019 as well as balloon angioplasty. Patient will be on Primacor after right heart cath.  PICC line ordered by cardiology. Continue to monitor on telemetry. Currently ruled out for ACS given flat troponin and no chest pain. Patient was on IV heparin for ACS protocol which is currently discontinued due to hematuria.  2.  COVID-19 pneumonia Acute COVID-19 Viral illness Lab Results  Component Value Date   SARSCOV2NAA POSITIVE (A) 12/14/2019   SARSCOV2NAA NEGATIVE 10/29/2019   CXR: hazy bilateral peripheral opacities  Recent Labs    12/17/19 0312 12/18/19 0346 12/19/19 0348  DDIMER 1.00* 0.93* 0.86*  FERRITIN 93 209 226  CRP 1.2* 1.9* 4.4*   Tmax last 24 hours:  Temp (24hrs), Avg:98.7 F (37.1 C), Min:97.6 F (36.4 C), Max:100.7 F (38.2 C)  Oxygen requirements: Currently on 2 LPM Diuretics: Resuming Lasix Vitamin C and Zinc: Continue DVT  Prophylaxis: SCD, pharmacological prophylaxis contraindicated due to Due to GI bleed  remdesivir: Started on 12/17/2019  Prone positioning: Patient encouraged to stay in prone position as much as possible.  PPE During this encounter: Patient Isolation: Airborne + Droplet + Contact HCP PPE: Surgical mask, face shield, gown, gloves Patient PPE: None  The treatment plan and use of medications and known side effects were discussed with patient/family. It was clearly explained that there is no proven definitive treatment for COVID-19 infection yet. Any medications used here are based on case reports/anecdotal data which are not peer-reviewed and has not been studied using randomized control trials.  Complete risks and long-term side effects are unknown, however in the best clinical judgment they seem to be of some clinical benefit rather than medical risks.  Patient/family agree with the treatment plan and want to receive these treatments as indicated.   3.  Hematuria In the setting of being on IV heparin. CT renal protocol negative for any acute abnormality.  H&H stable.  4.  Acute metabolic encephalopathy from hypoglycemia From poor p.o. intake due to his n.p.o. status. Currently resolved.  5.  Type 2 diabetes mellitus with uncontrolled hypoglycemia and renal complication Blood sugar relatively stable for now. Continue sliding scale insulin.  Monitor.  6.  Paroxysmal A. Fib Essential hypertension Currently rate controlled. Continue amiodarone and Coreg. Initially anticoagulated with Eliquis currently on due to hematuria. Continue losartan and Coreg.  7.  CKD stage IIIa. Serum creatinine mildly elevated. Continue to monitor closely.  8.  UTI. Treated with Rocephin. Currently no symptoms.  9.  Hypothyroidism. TSH stable. Continue Synthroid.  10.  Mild hyperkalemia. Repeat potassium stable. Continue to monitor.  Diet: Cardiac diet DVT Prophylaxis: SCD, pharmacological  prophylaxis contraindicated due to Hematuria   apixaban (ELIQUIS) tablet 5 mg    Advance goals of care discussion: Full code  Family Communication: no family was present at bedside, at the time of interview.  Discussed with daughter on the phone. The pt provided permission to discuss medical plan with the family. Opportunity was given to ask question and all questions were answered satisfactorily.   Disposition:  Status is: Inpatient  Remains inpatient appropriate because:Ongoing diagnostic testing needed not appropriate for outpatient work up and IV treatments appropriate due to intensity of illness or inability to take PO   Dispo: The patient is from: Home              Anticipated d/c is to: SNF              Anticipated d/c date is: 2 days              Patient currently is not medically stable to d/c.  Subjective: No acute complaint.  No nausea no vomiting.  Tells me that she is actually seeing somebody sitting in the room when nobody is in the room.  No nausea no vomiting.  Physical Exam:  General: Appear in mild distress, no Rash; Oral Mucosa Clear, moist. no Abnormal Neck Mass Or lumps, Conjunctiva normal  Cardiovascular: S1 and S2 Present, no Murmur, Respiratory: increased respiratory effort, Bilateral Air entry present and bilateral  Crackles, no wheezes Abdomen: Bowel Sound present, Soft and no tenderness Extremities: bilateral Pedal edema, no calf tenderness Neurology: alert and oriented to time, place, and person, mildly delirious affect anxious. no new focal deficit Gait not checked due to patient safety concerns  Vitals:   12/19/19 0215 12/19/19 0500 12/19/19 0600 12/19/19 0800  BP:   101/83 116/69  Pulse:   69 74  Resp:   20 20  Temp: 98.1 F (36.7 C)  98.4 F (36.9 C) 98.7 F (37.1 C)  TempSrc:   Oral Oral  SpO2:   94% 95%  Weight:  70.5 kg    Height:        Intake/Output Summary (Last 24 hours) at 12/19/2019 2013 Last data filed at 12/18/2019 2359 Gross  per 24 hour  Intake 537.81 ml  Output --  Net 537.81 ml   Filed Weights   12/16/19 0900 12/17/19 0404 12/19/19 0500  Weight: 63.5 kg 66.2 kg 70.5 kg    Data Reviewed: I have personally reviewed and interpreted daily labs, tele strips, imagings as discussed above. I reviewed all nursing notes, pharmacy notes, vitals, pertinent old records I have discussed plan of care as described above with RN and patient/family.  CBC: Recent Labs  Lab 12/15/19 0522 12/15/19 0522 12/16/19 7741 12/16/19 2878 12/17/19 0312 12/17/19 0312 12/18/19 0346 12/18/19 1829 12/19/19 0348 12/19/19 1511 12/19/19 1512  WBC 5.1   < > 4.6  --  4.2  --  4.3 4.4 3.3*  --   --   NEUTROABS 3.6  --  2.8  --  2.8  --  2.5  --  2.0  --   --   HGB 11.8*   < > 11.8*   < > 11.0*   < > 12.9 12.6 11.9* 12.6 12.6  HCT 38.6   < > 39.0   < > 36.1   < > 42.3 42.8 39.1 37.0 37.0  MCV 89.4   < > 88.6  --  88.3  --  90.8 90.1 89.7  --   --   PLT 225   < > 196  --  190  --  155 144* 128*  --   --    < > = values in this interval not displayed.   Basic Metabolic Panel: Recent Labs  Lab 12/17/19 0312 12/17/19 0312 12/18/19 0346 12/18/19 0346 12/18/19 0812 12/18/19 0812 12/18/19 1235 12/18/19 1829 12/19/19 0348 12/19/19 1511 12/19/19 1512  NA 134*   < > 134*   < > 135  --   --  132* 138 135 136  K 3.9   < > 5.9*   < > 4.2   < > 4.3 4.6 4.9 4.2 4.0  CL 95*  --  93*  --  94*  --   --  93* 94*  --   --   CO2 28  --  28  --  28  --   --  28 30  --   --   GLUCOSE 92  --  108*  --  102*  --   --  140* 90  --   --   BUN 21  --  23  --  24*  --   --  29* 29*  --   --   CREATININE 1.31*  --  1.63*  --  1.59*  --   --  1.79* 1.62*  --   --   CALCIUM 8.0*  --  8.2*  --  7.8*  --   --  8.1* 8.1*  --   --   MG 1.7  --   --   --   --   --   --   --   --   --   --    < > = values in this interval not displayed.    Studies: CARDIAC CATHETERIZATION  Result Date: 12/19/2019  DIAGNOSTIC IMAGING: Dr. Shirlee LatchMcLean  Lesion #2:  Previously placed Mid RCA-2 stent (DES) is patent with exception of a: Mid RCA-3 lesion is 85% focal in-stent restenosis  Lesion #3: Previously placed Prox RCA stent (DES) is widely patent up until the focal Mid RCA-1 lesion is 60% stenosed-in-stent restenosis:.  Lesion #1: 2nd RPL lesion is 99% stenosed.  --RCA  Distal RPAV lesion is 100% stenosed - know occlusion.  --LCA  Ost LAD to Prox LAD lesion is 80% stenosed. Prox LAD to Mid LAD lesion is 95% stenosed. Distal competitive flow from LIMA (not visualized)  Ost Cx to Prox Cx lesion is 95% stenosed.  Ramus lesion is 80% stenosed.  Mid Cx lesion is 90% stenosed.  -- GRAFTS  SVG-RI & SVG-OM grafts were visualized by angiography. Both grafts are normal in caliber and exhibit no disease.  LIMA-LAD graft was not visualized due to inability to cannulate.  SVG-rPDA Graft was not visualized due to known occlusion. Origin lesion is 100% stenosed.  --------------------------------------------------------------------------- -------  ----------------- PCI: CULPRIT LESIONS in native RCA (ISR) and RPL 2 (new lesion) -----Dr. Herbie BaltimoreHarding ------------------------------  Lesion #3: Previously placed Prox RCA stent (DES) is widely patent up until the focal Mid RCA-1 lesion is 60% stenosed-in-stent restenosis:.  Lesion #2: Previously placed Mid RCA-2 stent (DES) is patent with exception of a: Mid RCA-3 lesion is 85% focal in-stent restenosis  Scoring balloon angioplasty was performed on both in-stent stenosis lesions, using a BALLOON [place]OL[place] 2.50X10. Followed by high-pressure [place] balloon angioplasty, using a BALLOON SAPPHIRE [place] 3.0X12.  Post intervention, there is a  0% residual stenosis for both lesions #2 and 3 as well as the the intervening stented segment.  ---  Lesion #1: 2nd RPL lesion is 99% stenosed.  A drug-eluting stent was successfully placed using a STENT RESOLUTE ONYX 2.0X12. Postdilated to 2.2 mm  Post intervention, there is a 0% residual stenosis.    Successful DES PCI of 90% proximal RPL 2 stenosis reduced to 0% using Resolute Onyx DES 2.0 mm at 12 mm postdilated to 2.2 mm  Successful 2.5 mm Wolverine scoring balloon and high-pressure 3.0 mm Lead balloon PTCA of distal half of the stented mid RCA segment reducing to 0-5%--lesions #2 and #3 with intervening stented segment treated as well. PLAN OF CARE: Transfer back to nursing unit for ongoing care; per Dr. Shirlee Latch, plan will be to start milrinone infusion.  She was loaded with clopidogrel 600 mg pre-PCI, will start 75 mg daily.  Is currently on aspirin and Eliquis, okay to stop aspirin if necessary for bleeding concerns, but restart Eliquis tomorrow morning  Bryan Lemma, MD  CARDIAC CATHETERIZATION  Result Date: 12/19/2019 1. Mildly elevated right and left heart filling pressures. 2. Low cardiac output. 3. Stable anatomy from prior except for 80% mRCA stenosis and likely culprit 99% mid PLV stenosis. Discussed with Dr. Herbie Baltimore, plan intervention to PLV and mRCA.  Will start on milrinone 0.25 mcg/kg/min for now post-PCI to optimize cardiac output.  Will give very gentle IV fluid post-procedure today, will likely need some diuresis tomorrow.  Will place PICC.   CT RENAL STONE STUDY  Result Date: 12/18/2019 CLINICAL DATA:  Hematuria EXAM: CT ABDOMEN AND PELVIS WITHOUT CONTRAST TECHNIQUE: Multidetector CT imaging of the abdomen and pelvis was performed following the standard protocol without IV contrast. COMPARISON:  CT abdomen and pelvis 02/20/2009, chest radiograph 12/16/2019, CTA chest 05/10/2017 FINDINGS: Lower chest: Moderate bilateral pleural effusions with adjacent areas of passive atelectasis. Some additional multifocal areas patchy ground-[place]inwald and consolidative opacity present throughout the lung bases. Additional perifissural nodule measuring 6 mm in the right middle lobe along the major fissure (4/10). Cardiomegaly with pacer lead at the cardiac apex. Trace pericardial effusion. Extensive  coronary artery calcifications. Hepatobiliary: No visible focal liver lesions on this unenhanced CT. Smooth liver surface contour. Normal liver attenuation. Mild periportal edema. Gallbladder is mildly distended containing dependently layering hypoattenuating and calcified material, possibly a mixture of calcified and noncalcified gallstones. No pericholecystic fluid or inflammation nor significant gallbladder wall thickening is present. No biliary ductal dilatation. Pancreas: Unremarkable. No pancreatic ductal dilatation or surrounding inflammatory changes. Spleen: Extensive vascular coil packs seen in the left upper quadrant with associated streak artifact. Heterogeneous appearance of the splenic parenchyma may reflect sequela of prior splenic embolization. Adrenals/Urinary Tract: Normal adrenal glands. Kidneys symmetrically located. No visible concerning renal lesion. Extensive vascular calcifications without urolithiasis or hydronephrosis. Circumferential bladder wall thickening with faint perivesicular hazy stranding. Stomach/Bowel: Distal esophagus, stomach and duodenal sweep are unremarkable. No small bowel wall thickening or dilatation. No evidence of obstruction. A normal appendix is visualized. No colonic dilatation or wall thickening. Pancolonic diverticula without focal inflammation to suggest diverticulitis. Vascular/Lymphatic: Atherosclerotic calcifications within the abdominal aorta and branch vessels. No aneurysm or ectasia. No enlarged abdominopelvic lymph nodes. Reproductive: Uterus is surgically absent. No concerning adnexal lesions. Other: Moderate body wall edema most pronounced over the flanks and posterior soft tissues. Small volume perihepatic ascites as well as trace free fluid in the deep pelvis. No abdominopelvic free air. No bowel containing hernias. Musculoskeletal: Diffuse osseous demineralization, similar to  comparison. There are remote appearing posttraumatic deformities of the right  inferior and superior pubic ramus, left SI joint, and multiple lateral left ribs. No convincing acute fracture or suspicious osseous lesions. IMPRESSION: 1. Circumferential bladder wall thickening with faint perivesicular hazy stranding, suggestive of cystitis. Correlate with urinalysis. No urolithiasis or hydronephrosis. 2. Features suggestive of CHF and anasarca with moderate bilateral effusions, periportal edema, small volume ascites, and circumferential body wall edema. 3. Cholelithiasis without evidence of acute cholecystitis. 4. Pancolonic diverticula without evidence of diverticulitis. 5. Remote appearing posttraumatic deformities of the right inferior and superior pubic ramus, left SI joint, and multiple lateral left ribs. 6. Aortic Atherosclerosis (ICD10-I70.0). Electronically Signed   By: Kreg Shropshire M.D.   On: 12/18/2019 23:22   [place] EKG SITE RITE  Result Date: 12/19/2019 If Site Rite image not attached, placement could not be confirmed due to current cardiac rhythm.   Scheduled Meds: . albuterol  2 puff Inhalation BID  . amiodarone  100 mg Oral Daily  . [START ON 12/20/2019] apixaban  5 mg Oral BID  . vitamin C  500 mg Oral Daily  . aspirin  81 mg Oral Daily  . Chlorhexidine Gluconate Cloth  6 each Topical Daily  . cholecalciferol  1,000 Units Oral Daily  . [START ON 12/20/2019] clopidogrel  75 mg Oral Q breakfast  . dexamethasone (DECADRON) injection  6 mg Intravenous Daily  . ezetimibe  10 mg Oral QHS  . feeding supplement (ENSURE ENLIVE)  237 mL Oral Daily  . insulin aspart  0-15 Units Subcutaneous TID WC  . levothyroxine  50 mcg Oral Q0600  . pantoprazole  40 mg Oral Q1200  . simvastatin  40 mg Oral QHS  . sodium chloride flush  10-40 mL Intracatheter Q12H  . sodium chloride flush  3 mL Intravenous Q12H  . sodium chloride flush  3 mL Intravenous Q12H  . sodium chloride flush  3 mL Intravenous Q12H  . sodium chloride flush  3 mL Intravenous Q12H  . spironolactone  12.5 mg Oral  Daily  . venlafaxine XR  150 mg Oral QHS  . vitamin B-12  1,000 mcg Oral Daily  . zinc sulfate  220 mg Oral Daily   Continuous Infusions: . sodium chloride    . sodium chloride    . milrinone    . remdesivir 100 mg in NS 100 mL 100 mg (12/19/19 0857)   PRN Meds: sodium chloride, acetaminophen, guaiFENesin-dextromethorphan, hydrALAZINE, labetalol, nitroGLYCERIN, ondansetron (ZOFRAN) IV, sodium chloride flush, sodium chloride flush, traMADol  Time spent: 35 minutes  Author: Lynden Oxford, MD [facility] 12/19/2019 8:13 PM  To reach On-call, see care teams to locate the attending and reach out via [email or web address]. Between 7PM-7AM, please contact night-coverage If you still have difficulty reaching the attending provider, please page the [facility] (Director on Call) for [facility] on amion for assistance.

## 2019-12-19 NOTE — Interval H&P Note (Signed)
History and Physical Interval Note:  12/19/2019 2:42 PM  [name]  has presented today for surgery, with the diagnosis of heart failure.  The various methods of treatment have been discussed with the patient and family. After consideration of risks, benefits and other options for treatment, the patient has consented to  Procedure(s): RIGHT/LEFT HEART CATH AND CORONARY/GRAFT ANGIOGRAPHY (N/A) as a surgical intervention.  The patient's history has been reviewed, patient examined, no change in status, stable for surgery.  I have reviewed the patient's chart and labs.  Questions were answered to the patient's satisfaction.     [name] [employer]

## 2019-12-19 NOTE — Progress Notes (Signed)
Patient ID: [name], female   DOB: Mar 04, 1946, 74 y.o.   MRN: [id number]     Advanced Heart Failure Rounding Note  PCP-Cardiologist: Chrystie Nose, MD   Subjective:    Afebrile today, creatinine down to 1.6.  Feeling better. No chest pain.   RHC/LHC today:  Coronary Findings  Diagnostic Dominance: Right Left Main  Short, no severe disease.  Left Anterior Descending  95% mid LAD with competitive flow from LIMA. LIMA not engaged => right-sided aortic arch, unable to cannulate left subclavian (same problem on prior cath). Radial artery not used given very weak/thready pulse and small arm.  Left Circumflex  Diffusely disease proximally up to 95%. SVG-OM1 and SVG-OM2 patent.  Right Coronary Artery  Known occlusion of the SVG-RCA. Long stented segment in proximal to mid RCA. 80% in-stent restenosis in the mid RCA. 99% stenosis mid PLV branch (possible culprit for unstable angina).  Intervention  No interventions have been documented. Right Heart  Right Heart Pressures RHC Procedural Findings: Hemodynamics (mmHg) RA 9 RV 37/10 PA 43/12, mean 25 PCWP mean 19 LV 96/20 AO 97/52  Oxygen saturations: PA 53% AO 98%  Cardiac Output (Fick) 3.05  Cardiac Index (Fick) 1.83  Cardiac Output (thermo) 2.48 Cardiac Index (thermo) 1.49      Objective:   Weight Range: 70.5 kg Body mass index is 28.43 kg/m.   Vital Signs:   Temp:  [97.6 F (36.4 C)-100.7 F (38.2 C)] 98.7 F (37.1 C) (08/13 0800) Pulse Rate:  [69-75] 74 (08/13 0800) Resp:  [19-21] 20 (08/13 0800) BP: (101-116)/(62-83) 116/69 (08/13 0800) SpO2:  [90 %-95 %] 95 % (08/13 0800) Weight:  [70.5 kg] 70.5 kg (08/13 0500) Last BM Date: 12/18/19  Weight change: Filed Weights   12/16/19 0900 12/17/19 0404 12/19/19 0500  Weight: 63.5 kg 66.2 kg 70.5 kg    Intake/Output:   Intake/Output Summary (Last 24 hours) at 12/19/2019 1626 Last data filed at 12/18/2019 2359 Gross per 24 hour  Intake 637.81 ml    Output 200 ml  Net 437.81 ml      Physical Exam    General: NAD Neck: JVP 8-9 cm, no thyromegaly or thyroid nodule.  Lungs: Mild crackles at bases.  CV: Nondisplaced PMI.  Heart regular S1/S2, no S3/S4, no murmur.  No peripheral edema.  Abdomen: Soft, nontender, no hepatosplenomegaly, no distention.  Skin: Intact without lesions or rashes.  Neurologic: Alert and oriented x 3.  Psych: Normal affect. Extremities: No clubbing or cyanosis.  HEENT: Normal.    Telemetry   NSR, personally reviewed.   Labs    CBC Recent Labs    12/18/19 0346 12/18/19 0346 12/18/19 1829 12/19/19 0348  WBC 4.3   < > 4.4 3.3*  NEUTROABS 2.5  --   --  2.0  HGB 12.9   < > 12.6 11.9*  HCT 42.3   < > 42.8 39.1  MCV 90.8   < > 90.1 89.7  PLT 155   < > 144* 128*   < > = values in this interval not displayed.   Basic Metabolic Panel Recent Labs    08/65/78 0312 12/18/19 0346 12/18/19 1829 12/19/19 0348  NA 134*   < > 132* 138  K 3.9   < > 4.6 4.9  CL 95*   < > 93* 94*  CO2 28   < > 28 30  GLUCOSE 92   < > 140* 90  BUN 21   < > 29* 29*  CREATININE  1.31*   < > 1.79* 1.62*  CALCIUM 8.0*   < > 8.1* 8.1*  MG 1.7  --   --   --    < > = values in this interval not displayed.   Liver Function Tests Recent Labs    12/18/19 0346 12/19/19 0348  AST 40 43*  ALT 16 17  ALKPHOS 58 66  BILITOT 0.8 0.8  PROT 6.1* 6.2*  ALBUMIN 2.9* 2.8*   No results for input(s): LIPASE, AMYLASE in the last 72 hours. Cardiac Enzymes No results for input(s): CKTOTAL, CKMB, CKMBINDEX, TROPONINI in the last 72 hours.  BNP: BNP (last 3 results) Recent Labs    12/14/19 1355 12/14/19 2302 12/17/19 0255  BNP 4,206.6* 3,317.2* 1,726.9*    ProBNP (last 3 results) No results for input(s): PROBNP in the last 8760 hours.   D-Dimer Recent Labs    12/18/19 0346 12/19/19 0348  DDIMER 0.93* 0.86*   Hemoglobin A1C No results for input(s): HGBA1C in the last 72 hours. Fasting Lipid Panel Recent Labs     12/17/19 0312  CHOL 56  HDL 26*  LDLCALC 21  TRIG 44  CHOLHDL 2.2   Thyroid Function Tests No results for input(s): TSH, T4TOTAL, T3FREE, THYROIDAB in the last 72 hours.  Invalid input(s): FREET3  Other results:   Imaging    CARDIAC CATHETERIZATION  Result Date: 12/19/2019 1. Mildly elevated right and left heart filling pressures. 2. Low cardiac output. 3. Stable anatomy from prior except for 80% mRCA stenosis and likely culprit 99% mid PLV stenosis. Discussed with Dr. Herbie Baltimore, plan intervention to PLV and mRCA.  Will start on milrinone 0.25 mcg/kg/min for now post-PCI to optimize cardiac output.  Will give very gentle IV fluid post-procedure today, will likely need some diuresis tomorrow.  Will place PICC.   CT RENAL STONE STUDY  Result Date: 12/18/2019 CLINICAL DATA:  Hematuria EXAM: CT ABDOMEN AND PELVIS WITHOUT CONTRAST TECHNIQUE: Multidetector CT imaging of the abdomen and pelvis was performed following the standard protocol without IV contrast. COMPARISON:  CT abdomen and pelvis 02/20/2009, chest radiograph 12/16/2019, CTA chest 05/10/2017 FINDINGS: Lower chest: Moderate bilateral pleural effusions with adjacent areas of passive atelectasis. Some additional multifocal areas patchy ground-Dierks and consolidative opacity present throughout the lung bases. Additional perifissural nodule measuring 6 mm in the right middle lobe along the major fissure (4/10). Cardiomegaly with pacer lead at the cardiac apex. Trace pericardial effusion. Extensive coronary artery calcifications. Hepatobiliary: No visible focal liver lesions on this unenhanced CT. Smooth liver surface contour. Normal liver attenuation. Mild periportal edema. Gallbladder is mildly distended containing dependently layering hypoattenuating and calcified material, possibly a mixture of calcified and noncalcified gallstones. No pericholecystic fluid or inflammation nor significant gallbladder wall thickening is present. No biliary  ductal dilatation. Pancreas: Unremarkable. No pancreatic ductal dilatation or surrounding inflammatory changes. Spleen: Extensive vascular coil packs seen in the left upper quadrant with associated streak artifact. Heterogeneous appearance of the splenic parenchyma may reflect sequela of prior splenic embolization. Adrenals/Urinary Tract: Normal adrenal glands. Kidneys symmetrically located. No visible concerning renal lesion. Extensive vascular calcifications without urolithiasis or hydronephrosis. Circumferential bladder wall thickening with faint perivesicular hazy stranding. Stomach/Bowel: Distal esophagus, stomach and duodenal sweep are unremarkable. No small bowel wall thickening or dilatation. No evidence of obstruction. A normal appendix is visualized. No colonic dilatation or wall thickening. Pancolonic diverticula without focal inflammation to suggest diverticulitis. Vascular/Lymphatic: Atherosclerotic calcifications within the abdominal aorta and branch vessels. No aneurysm or ectasia. No enlarged abdominopelvic lymph  nodes. Reproductive: Uterus is surgically absent. No concerning adnexal lesions. Other: Moderate body wall edema most pronounced over the flanks and posterior soft tissues. Small volume perihepatic ascites as well as trace free fluid in the deep pelvis. No abdominopelvic free air. No bowel containing hernias. Musculoskeletal: Diffuse osseous demineralization, similar to comparison. There are remote appearing posttraumatic deformities of the right inferior and superior pubic ramus, left SI joint, and multiple lateral left ribs. No convincing acute fracture or suspicious osseous lesions. IMPRESSION: 1. Circumferential bladder wall thickening with faint perivesicular hazy stranding, suggestive of cystitis. Correlate with urinalysis. No urolithiasis or hydronephrosis. 2. Features suggestive of CHF and anasarca with moderate bilateral effusions, periportal edema, small volume ascites, and  circumferential body wall edema. 3. Cholelithiasis without evidence of acute cholecystitis. 4. Pancolonic diverticula without evidence of diverticulitis. 5. Remote appearing posttraumatic deformities of the right inferior and superior pubic ramus, left SI joint, and multiple lateral left ribs. 6. Aortic Atherosclerosis (ICD10-I70.0). Electronically Signed   By: Kreg Shropshire M.D.   On: 12/18/2019 23:22   [place] EKG SITE RITE  Result Date: 12/19/2019 If Site Rite image not attached, placement could not be confirmed due to current cardiac rhythm.    Medications:     Scheduled Medications: . [MAR Hold] albuterol  2 puff Inhalation BID  . [MAR Hold] amiodarone  100 mg Oral Daily  . [MAR Hold] vitamin C  500 mg Oral Daily  . [MAR Hold] aspirin  81 mg Oral Daily  . [MAR Hold] cholecalciferol  1,000 Units Oral Daily  . [MAR Hold] dexamethasone (DECADRON) injection  6 mg Intravenous Daily  . [MAR Hold] ezetimibe  10 mg Oral QHS  . [MAR Hold] feeding supplement (ENSURE ENLIVE)  237 mL Oral Daily  . [MAR Hold] insulin aspart  0-15 Units Subcutaneous TID WC  . [MAR Hold] levothyroxine  50 mcg Oral Q0600  . [MAR Hold] pantoprazole  40 mg Oral Q1200  . [MAR Hold] simvastatin  40 mg Oral QHS  . [MAR Hold] sodium chloride flush  3 mL Intravenous Q12H  . [MAR Hold] sodium chloride flush  3 mL Intravenous Q12H  . [MAR Hold] sodium chloride flush  3 mL Intravenous Q12H  . [MAR Hold] spironolactone  12.5 mg Oral Daily  . [MAR Hold] venlafaxine XR  150 mg Oral QHS  . [MAR Hold] vitamin B-12  1,000 mcg Oral Daily  . [MAR Hold] zinc sulfate  220 mg Oral Daily    Infusions: . sodium chloride    . sodium chloride    . sodium chloride 10 mL/hr at 12/18/19 0525  . sodium chloride    . [START ON 12/20/2019] sodium chloride    . milrinone    . [MAR Hold] remdesivir 100 mg in NS 100 mL 100 mg (12/19/19 0857)    PRN Medications: sodium chloride, sodium chloride, sodium chloride, [MAR Hold] acetaminophen,  clopidogrel, fentaNYL, [MAR Hold] guaiFENesin-dextromethorphan, Heparin (Porcine) in NaCl, heparin sodium (porcine), lidocaine (PF), midazolam, [MAR Hold] nitroGLYCERIN, [MAR Hold] ondansetron (ZOFRAN) IV, sodium chloride flush, sodium chloride flush, sodium chloride flush, [MAR Hold] traMADol    Assessment/Plan   1. COVID-19 infection: Only got 1 dose of Pfizer vaccine, missed 2nd dose. Suspect main issues here is CHF, not COVID PNA. However, she has had periodic fevers and was started on remdesivir.  CXR with pulmonary edema, possible LLL infiltrate.  - Remdesivir per [facility].  2. Acute on chronic systolic CHF: History of ischemic cardiomyopathy.  MDT ICD.  Echo this admission  with fall in EF to 15-20%, global hypokinesis, moderate LV dilation, severely decreased RV function, severe MR, moderate-severe TR.  RHC today with mildly elevated filling pressures, cardiac output low.  Creatinine lower at 1.6.  - Will start on milrinone 0.25 mcg/kg/min for now post-PCI to optimize cardiac output.  Will give very gentle IV fluid post-cath today given contrast, will likely need some diuresis tomorrow.  Will place PICC for CVP and co-ox.  - Hold losartan with elevated creatinine.  - Continue spironolactone 12.5 daily.  - Patient has LBBB but probably not wide enough (129 msec) to benefit much from CRT.  - With frailty, think she would not be a good candidate for advanced therapies.  3. CAD: CABG x 4 2008. NSTEMI 05/2017>>LHC w/ occluded SVG-PDA and serial 70% and 90% stenoses in the proximal to mid RCA, treated w/ DES x 2 to RCA.  HS troponin this admit low level and flat, 23>>23>>36. Chest tightness on and off but now resolved, EF is lower on echo this admission.  Concern for unstable angina/small NSTEMI, coronary angiography today with stable anatomy except for 80% ISR mid RCA, 99% mid PLV stenosis (likely culprit).  - PCI today to RCA with Dr. Herbie BaltimoreHarding.  - Will need Plavix and a short course of ASA 81  post-procedure.  Careful given PAF and need for Eliquis.  - Continue statin, good LDL 21.  4. UTI: Treating with ceftriaxone.  5. Type 2 diabetes: Would benefit from dapagliflozin eventually.  6. 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  7. Atrial fibrillation: Paroxysmal.  NSR today.  She is on amiodarone.  - Continue amiodarone.  - Will need to restart Eliquis eventually, likely tomorrow morning if stable.   Length of Stay: 4  Marca Anconaalton Sireen Halk, MD  12/19/2019, 4:26 PM  Advanced Heart Failure Team Pager [phone] (M-F; 7a - 4p)  Please contact [facility] for night-coverage after hours (4p -7a ) and weekends on [email or web address]

## 2019-12-19 NOTE — Interval H&P Note (Signed)
History and Physical Interval Note:  12/19/2019 5:01 PM  [name]  has presented today for surgery, with the diagnosis of heart failure.  The various methods of treatment have been discussed with the patient and family. After consideration of risks, benefits and other options for treatment, the patient has consented to  Procedure(s): RIGHT/LEFT HEART CATH AND CORONARY/GRAFT ANGIOGRAPHY (N/A) CORONARY STENT INTERVENTION (N/A) as a surgical intervention.  The patient's history has been reviewed, patient examined, no change in status, stable for surgery.  I have reviewed the patient's chart and labs.  Questions were answered to the patient's satisfaction.     Cath Lab Visit (complete for each Cath Lab visit)  Clinical Evaluation Leading to the Procedure:   ACS: No.  Non-ACS:    Anginal Classification: CCS III - NYHA CLASS III CHF  Anti-ischemic medical therapy: Maximal Therapy (2 or more classes of medications)  Non-Invasive Test Results: No non-invasive testing performed  Prior CABG: Previous CABG   Bryan Lemma

## 2019-12-20 DIAGNOSIS — I5043 Acute on chronic combined systolic (congestive) and diastolic (congestive) heart failure: Secondary | ICD-10-CM

## 2019-12-20 DIAGNOSIS — R570 Cardiogenic shock: Secondary | ICD-10-CM

## 2019-12-20 DIAGNOSIS — I208 Other forms of angina pectoris: Secondary | ICD-10-CM | POA: Diagnosis not present

## 2019-12-20 DIAGNOSIS — Z9861 Coronary angioplasty status: Secondary | ICD-10-CM

## 2019-12-20 DIAGNOSIS — I509 Heart failure, unspecified: Secondary | ICD-10-CM | POA: Diagnosis not present

## 2019-12-20 LAB — GLUCOSE, CAPILLARY
Glucose-Capillary: 203 mg/dL — ABNORMAL HIGH (ref 70–99)
Glucose-Capillary: 197 mg/dL — ABNORMAL HIGH (ref 70–99)
Glucose-Capillary: 236 mg/dL — ABNORMAL HIGH (ref 70–99)
Glucose-Capillary: 261 mg/dL — ABNORMAL HIGH (ref 70–99)

## 2019-12-20 LAB — BASIC METABOLIC PANEL
Anion gap: 12 (ref 5–15)
BUN: 39 mg/dL — ABNORMAL HIGH (ref 8–23)
CO2: 27 mmol/L (ref 22–32)
Calcium: 7.7 mg/dL — ABNORMAL LOW (ref 8.9–10.3)
Chloride: 95 mmol/L — ABNORMAL LOW (ref 98–111)
Creatinine, Ser: 2.01 mg/dL — ABNORMAL HIGH (ref 0.44–1.00)
GFR calc Af Amer: 27 mL/min — ABNORMAL LOW (ref >60)
GFR calc non Af Amer: 24 mL/min — ABNORMAL LOW (ref >60)
Glucose, Bld: 231 mg/dL — ABNORMAL HIGH (ref 70–99)
Potassium: 3.7 mmol/L (ref 3.5–5.1)
Sodium: 134 mmol/L — ABNORMAL LOW (ref 135–145)

## 2019-12-20 LAB — COMPREHENSIVE METABOLIC PANEL
ALT: 19 U/L (ref 0–44)
AST: 41 U/L (ref 15–41)
Albumin: 2.4 g/dL — ABNORMAL LOW (ref 3.5–5.0)
Alkaline Phosphatase: 56 U/L (ref 38–126)
Anion gap: 10 (ref 5–15)
BUN: 34 mg/dL — ABNORMAL HIGH (ref 8–23)
CO2: 27 mmol/L (ref 22–32)
Calcium: 7.5 mg/dL — ABNORMAL LOW (ref 8.9–10.3)
Chloride: 95 mmol/L — ABNORMAL LOW (ref 98–111)
Creatinine, Ser: 1.65 mg/dL — ABNORMAL HIGH (ref 0.44–1.00)
GFR calc Af Amer: 35 mL/min — ABNORMAL LOW (ref >60)
GFR calc non Af Amer: 30 mL/min — ABNORMAL LOW (ref >60)
Glucose, Bld: 196 mg/dL — ABNORMAL HIGH (ref 70–99)
Potassium: 3.9 mmol/L (ref 3.5–5.1)
Sodium: 132 mmol/L — ABNORMAL LOW (ref 135–145)
Total Bilirubin: 0.8 mg/dL (ref 0.3–1.2)
Total Protein: 5.3 g/dL — ABNORMAL LOW (ref 6.5–8.1)

## 2019-12-20 LAB — COOXEMETRY PANEL
Carboxyhemoglobin: 1.5 % (ref 0.5–1.5)
Methemoglobin: 0.9 % (ref 0.0–1.5)
O2 Saturation: 68.8 %
Total hemoglobin: 11.8 g/dL — ABNORMAL LOW (ref 12.0–16.0)

## 2019-12-20 LAB — CBC WITH DIFFERENTIAL/PLATELET
Abs Immature Granulocytes: 0.03 10*3/uL (ref 0.00–0.07)
Basophils Absolute: 0 10*3/uL (ref 0.0–0.1)
Basophils Relative: 0 %
Eosinophils Absolute: 0 10*3/uL (ref 0.0–0.5)
Eosinophils Relative: 0 %
HCT: 36.8 % (ref 36.0–46.0)
Hemoglobin: 11.2 g/dL — ABNORMAL LOW (ref 12.0–15.0)
Immature Granulocytes: 1 %
Lymphocytes Relative: 10 %
Lymphs Abs: 0.2 10*3/uL — ABNORMAL LOW (ref 0.7–4.0)
MCH: 27.1 pg (ref 26.0–34.0)
MCHC: 30.4 g/dL (ref 30.0–36.0)
MCV: 89.1 fL (ref 80.0–100.0)
Monocytes Absolute: 0.3 10*3/uL (ref 0.1–1.0)
Monocytes Relative: 11 %
Neutro Abs: 1.8 10*3/uL (ref 1.7–7.7)
Neutrophils Relative %: 78 %
Platelets: 112 10*3/uL — ABNORMAL LOW (ref 150–400)
RBC: 4.13 MIL/uL (ref 3.87–5.11)
RDW: 15.9 % — ABNORMAL HIGH (ref 11.5–15.5)
WBC: 2.3 10*3/uL — ABNORMAL LOW (ref 4.0–10.5)
nRBC: 0 % (ref 0.0–0.2)

## 2019-12-20 LAB — FERRITIN: Ferritin: 288 ng/mL (ref 11–307)

## 2019-12-20 LAB — POCT ACTIVATED CLOTTING TIME
Activated Clotting Time: 197 seconds
Activated Clotting Time: 202 seconds
Activated Clotting Time: 158 seconds

## 2019-12-20 LAB — C-REACTIVE PROTEIN: CRP: 4.2 mg/dL — ABNORMAL HIGH (ref <1.0)

## 2019-12-20 LAB — PROCALCITONIN: Procalcitonin: 0.67 ng/mL

## 2019-12-20 LAB — D-DIMER, QUANTITATIVE: D-Dimer, Quant: 0.9 ug/mL-FEU — ABNORMAL HIGH (ref 0.00–0.50)

## 2019-12-20 MED ORDER — IPRATROPIUM-ALBUTEROL 20-100 MCG/ACT IN AERS
1.0000 | INHALATION_SPRAY | Freq: Four times a day (QID) | RESPIRATORY_TRACT | Status: DC
  Administered 2019-12-20 – 2019-12-25 (×17): 1 via RESPIRATORY_TRACT
  Filled 2019-12-20: qty 4

## 2019-12-20 MED ORDER — CEFDINIR 300 MG PO CAPS
300.0000 mg | ORAL_CAPSULE | Freq: Two times a day (BID) | ORAL | Status: DC
  Administered 2019-12-20 – 2019-12-21 (×3): 300 mg via ORAL
  Filled 2019-12-20 (×5): qty 1

## 2019-12-20 MED ORDER — ALUM & MAG HYDROXIDE-SIMETH 200-200-20 MG/5ML PO SUSP
30.0000 mL | Freq: Four times a day (QID) | ORAL | Status: DC | PRN
  Administered 2019-12-20: 30 mL via ORAL
  Filled 2019-12-20: qty 30

## 2019-12-20 MED ORDER — METHYLPREDNISOLONE SODIUM SUCC 40 MG IJ SOLR
40.0000 mg | Freq: Two times a day (BID) | INTRAMUSCULAR | Status: DC
  Administered 2019-12-20 – 2019-12-24 (×8): 40 mg via INTRAVENOUS
  Filled 2019-12-20 (×8): qty 1

## 2019-12-20 MED ORDER — AZITHROMYCIN 250 MG PO TABS
500.0000 mg | ORAL_TABLET | Freq: Every day | ORAL | Status: DC
  Administered 2019-12-20 – 2019-12-22 (×3): 500 mg via ORAL
  Filled 2019-12-20 (×3): qty 2

## 2019-12-20 NOTE — Progress Notes (Signed)
Pt c/o of chest pressure after eating her spagetti . Pt indicated it started right after she had eaten a few bites. She did not want to continue  eating her food. Maalox 30 cc given as PRN medication. Oncoming RN aware of pt complaint  and will reevaluate as needed.

## 2019-12-20 NOTE — Progress Notes (Signed)
Arterial sheath removed per order. Pressure held for 20 minutes. Site is a level 0. Patient remained hemodynamically stable throughout pull. Followed by a venous sheath removal.  per order. Pressure held for 10 minutes. Site is a level 0. Patient remained hemodynamically stable throughout pull.

## 2019-12-20 NOTE — Progress Notes (Signed)
[facility] Progress Note  Patient: [name]    NOB:[id number]  DOA: 12/14/2019     Date of Service: the patient was seen and examined on 12/20/2019  Brief hospital course: Past medical history of chronic systolic CHF, SP AICD implant, PAF on Eliquis, type II DM, partially vaccinated.  Presents with shortness of breath and swelling in the leg with chest pain found to have acute decompensated systolic CHF.  Tested positive for Covid.  Started having fever on 8/11//2021.  Currently plan is continue treating Covid and further work-up for CHF per cardiology.  Assessment and Plan: 1.  Acute hypoxic respiratory failure CAD SP DES of proximal RPL 2 PTCA of the mid RCA on 12/19/2019 History of CABG 2008 Acute on chronic combined systolic and diastolic CHF Moderate bilateral pleural effusion Med[employer]onic ICD Chest pain/angina Presents with shortness of breath and cough.  Also had chest pain. Found to have significant volume overload consistent with decompensated systolic CHF. Cardiology was consulted. Patient was started on aggressive IV diuresis. Currently volume significantly better. Oxygenation stable. Underwent left heart cath with DES placement on 12/19/2019 as well as balloon angioplasty. Patient will be on Primacor after right heart cath.  PICC line ordered by cardiology. Continue to monitor on telemetry. Currently ruled out for ACS given flat troponin and no chest pain. Patient was on IV heparin for ACS protocol which is currently discontinued due to hematuria.  2.  COVID-19 pneumonia Acute COVID-19 Viral illness  Lab Results  Component Value Date   SARSCOV2NAA POSITIVE (A) 12/14/2019   SARSCOV2NAA NEGATIVE 10/29/2019   CXR: hazy bilateral peripheral opacities  Recent Labs    12/18/19 0346 12/19/19 0348 12/20/19 0631  DDIMER 0.93* 0.86* 0.90*  FERRITIN 209 226 288  CRP 1.9* 4.4* 4.2*   Tmax last 24 hours:  Temp (24hrs), Avg:98 F (36.7 C), Min:97.9 F (36.6 C),  Max:98 F (36.7 C)  Oxygen requirements: Currently on 2 LPM Diuretics: Resuming Lasix Vitamin C and Zinc: Continue DVT Prophylaxis: SCD, pharmacological prophylaxis contraindicated due to Due to GI bleed  remdesivir: Started on 12/17/2019 Antibiotic started for total of 5 days on 12/20/2019.  Prone positioning: Patient encouraged to stay in prone position as much as possible.  PPE During this encounter: Patient Isolation: Airborne + Droplet + Contact HCP PPE: Surgical mask, face shield, gown, gloves Patient PPE: None  The treatment plan and use of medications and known side effects were discussed with patient/family. It was clearly explained that there is no proven definitive treatment for COVID-19 infection yet. Any medications used here are based on case reports/anecdotal data which are not peer-reviewed and has not been studied using randomized control trials.  Complete risks and long-term side effects are unknown, however in the best clinical judgment they seem to be of some clinical benefit rather than medical risks.  Patient/family agree with the treatment plan and want to receive these treatments as indicated.   3.  Hematuria In the setting of being on IV heparin. CT renal protocol negative for any acute abnormality.  H&H stable.  4.  Acute metabolic encephalopathy from hypoglycemia From poor p.o. intake due to his n.p.o. status. Currently resolved.  5.  Type 2 diabetes mellitus with uncontrolled hypoglycemia and renal complication Blood sugar relatively stable for now. Continue sliding scale insulin.  Monitor.  6.  Paroxysmal A. Fib Essential hypertension Currently rate controlled. Continue amiodarone and Coreg. Initially anticoagulated with Eliquis currently on due to hematuria. Continue losartan and Coreg.  7.  Acute  kidney injury on CKD stage IIIa. Serum creatinine mildly elevated. Continue to monitor closely.  8.  UTI. Treated with Rocephin. Currently no  symptoms.  9.  Hypothyroidism. TSH stable. Continue Synthroid.  10.  Mild hyperkalemia. Repeat potassium stable. Continue to monitor.  11. Leukopenia Acute thrombocytopenia In the setting of COVID-19.  Monitor.   Diet: Cardiac diet DVT Prophylaxis:    apixaban (ELIQUIS) tablet 5 mg    Advance goals of care discussion: Full code  Family Communication: no family was present at bedside, at the time of interview.  Discussed with daughter on the phone. The pt provided permission to discuss medical plan with the family. Opportunity was given to ask question and all questions were answered satisfactorily.   Disposition:  Status is: Inpatient  Remains inpatient appropriate because:Ongoing diagnostic testing needed not appropriate for outpatient work up and IV treatments appropriate due to intensity of illness or inability to take PO   Dispo: The patient is from: Home              Anticipated d/c is to: SNF              Anticipated d/c date is: 2 days              Patient currently is not medically stable to d/c.  Subjective: Continues to have shortness of breath and cough.  No more fever.  No further confusion.  No nausea no vomiting.  No fever no chills.  Physical Exam:  General: Appear in mild distress, no Rash; Oral Mucosa Clear, moist. no Abnormal Neck Mass Or lumps, Conjunctiva normal  Cardiovascular: S1 and S2 Present, no Murmur, Respiratory: increased respiratory effort, Bilateral Air entry present and bilateral  Crackles, bilateral expiratory wheezes Abdomen: Bowel Sound present, Soft and no tenderness Extremities: bilateral Pedal edema, no calf tenderness Neurology: alert and oriented to time, place, and person, mildly delirious affect anxious. no new focal deficit Gait not checked due to patient safety concerns  Vitals:   12/19/19 1647 12/19/19 1652 12/20/19 0015 12/20/19 1723  BP: 116/69 121/73 115/61   Pulse: 73 70 79   Resp: 19 18 18    Temp:   98 F (36.7  C) 97.9 F (36.6 C)  TempSrc:   Oral Oral  SpO2: 97% 95% 93% 92%  Weight:      Height:        Intake/Output Summary (Last 24 hours) at 12/20/2019 1935 Last data filed at 12/20/2019 1700 Gross per 24 hour  Intake 750.39 ml  Output 430 ml  Net 320.39 ml   Filed Weights   12/16/19 0900 12/17/19 0404 12/19/19 0500  Weight: 63.5 kg 66.2 kg 70.5 kg    Data Reviewed: I have personally reviewed and interpreted daily labs, tele strips, imagings as discussed above. I reviewed all nursing notes, pharmacy notes, vitals, pertinent old records I have discussed plan of care as described above with RN and patient/family.  CBC: Recent Labs  Lab 12/16/19 0811 12/16/19 0811 12/17/19 0312 12/17/19 0312 12/18/19 0346 12/18/19 0346 12/18/19 1829 12/18/19 1829 12/19/19 0348 12/19/19 1511 12/19/19 1512 12/19/19 2120 12/20/19 0631  WBC 4.6   < > 4.2   < > 4.3  --  4.4  --  3.3*  --   --  1.6* 2.3*  NEUTROABS 2.8  --  2.8  --  2.5  --   --   --  2.0  --   --   --  1.8  HGB 11.8*   < >  11.0*   < > 12.9   < > 12.6   < > 11.9* 12.6 12.6 11.5* 11.2*  HCT 39.0   < > 36.1   < > 42.3   < > 42.8   < > 39.1 37.0 37.0 38.2 36.8  MCV 88.6   < > 88.3   < > 90.8  --  90.1  --  89.7  --   --  89.9 89.1  PLT 196   < > 190   < > 155  --  144*  --  128*  --   --  126* 112*   < > = values in this interval not displayed.   Basic Metabolic Panel: Recent Labs  Lab 12/17/19 0312 12/18/19 0346 12/18/19 1829 12/18/19 1829 12/19/19 0348 12/19/19 0348 12/19/19 1511 12/19/19 1512 12/19/19 2120 12/20/19 0631 12/20/19 1356  NA 134*   < > 132*   < > 138   < > 135 136 132* 132* 134*  K 3.9   < > 4.6   < > 4.9   < > 4.2 4.0 4.0 3.9 3.7  CL 95*   < > 93*  --  94*  --   --   --  95* 95* 95*  CO2 28   < > 28  --  30  --   --   --  30 27 27   GLUCOSE 92   < > 140*  --  90  --   --   --  180* 196* 231*  BUN 21   < > 29*  --  29*  --   --   --  30* 34* 39*  CREATININE 1.31*   < > 1.79*  --  1.62*  --   --   --   1.41* 1.65* 2.01*  CALCIUM 8.0*   < > 8.1*  --  8.1*  --   --   --  7.7* 7.5* 7.7*  MG 1.7  --   --   --   --   --   --   --   --   --   --    < > = values in this interval not displayed.    Studies: DG CHEST PORT 1 VIEW  Result Date: 12/19/2019 CLINICAL DATA:  Shortness of breath EXAM: PORTABLE CHEST 1 VIEW COMPARISON:  12/17/2019 FINDINGS: Right PICC line has been placed with the tip at the cavoatrial junction. Left AICD is unchanged. Prior CABG. Cardiomegaly. Bilateral airspace opacities, most confluent in the right upper lobe, worsening since prior study. Continued left lower lobe airspace opacity, unchanged. No effusions. IMPRESSION: Right PICC line tip at the cavoatrial junction. Bilateral airspace opacities could reflect edema or infection. Electronically Signed   By: 02/16/2020 M.D.   On: 12/19/2019 20:13    Scheduled Meds:  amiodarone  100 mg Oral Daily   apixaban  5 mg Oral BID   vitamin C  500 mg Oral Daily   aspirin  81 mg Oral Daily   azithromycin  500 mg Oral Daily   cefdinir  300 mg Oral Q12H   Chlorhexidine Gluconate Cloth  6 each Topical Daily   cholecalciferol  1,000 Units Oral Daily   clopidogrel  75 mg Oral Q breakfast   ezetimibe  10 mg Oral QHS   feeding supplement (ENSURE ENLIVE)  237 mL Oral Daily   insulin aspart  0-15 Units Subcutaneous TID WC   Ipratropium-Albuterol  1 puff Inhalation QID  levothyroxine  50 mcg Oral Q0600   methylPREDNISolone (SOLU-MEDROL) injection  40 mg Intravenous BID   pantoprazole  40 mg Oral Q1200   simvastatin  40 mg Oral QHS   sodium chloride flush  10-40 mL Intracatheter Q12H   sodium chloride flush  3 mL Intravenous Q12H   sodium chloride flush  3 mL Intravenous Q12H   sodium chloride flush  3 mL Intravenous Q12H   sodium chloride flush  3 mL Intravenous Q12H   spironolactone  12.5 mg Oral Daily   venlafaxine XR  150 mg Oral QHS   vitamin B-12  1,000 mcg Oral Daily   zinc sulfate  220 mg Oral  Daily   Continuous Infusions:  sodium chloride     milrinone 0.25 mcg/kg/min (12/20/19 1626)   remdesivir 100 mg in NS 100 mL 100 mg (12/20/19 0915)   PRN Meds: sodium chloride, acetaminophen, alum & mag hydroxide-simeth, guaiFENesin-dextromethorphan, nitroGLYCERIN, ondansetron (ZOFRAN) IV, sodium chloride flush, sodium chloride flush, traMADol  Time spent: 35 minutes  Author: Lynden Oxford, MD [facility] 12/20/2019 7:35 PM  To reach On-call, see care teams to locate the attending and reach out via [email or web address]. Between 7PM-7AM, please contact night-coverage If you still have difficulty reaching the attending provider, please page the [facility] (Director on Call) for [facility] on amion for assistance.

## 2019-12-20 NOTE — Progress Notes (Addendum)
Patient ID: [name], female   DOB: 27-Jul-1945, 74 y.o.   MRN: [id number]     Advanced Heart Failure Rounding Note  PCP-Cardiologist: Chrystie Nose, MD   Subjective:    Given Covid-19 positive, full PPE and donning/doffing protocols used for face to face interaction.  Feeling fair, "couldn't run a marathon but could maybe go a quarter mile." Cough is looser, working to bring up phelgm but none yet. Leg swelling somewhat improved, asking when Unna boots can come off. No chest pain, just mild diffuse discomfort with deep breathing. Hasn't noted any hematuria or hematochezia but isn't sure.    Coronary Findings  Diagnostic Dominance: Right Left Main  Short, no severe disease.  Left Anterior Descending  95% mid LAD with competitive flow from LIMA. LIMA not engaged => right-sided aortic arch, unable to cannulate left subclavian (same problem on prior cath). Radial artery not used given very weak/thready pulse and small arm.  Left Circumflex  Diffusely disease proximally up to 95%. SVG-OM1 and SVG-OM2 patent.  Right Coronary Artery  Known occlusion of the SVG-RCA. Long stented segment in proximal to mid RCA. 80% in-stent restenosis in the mid RCA. 99% stenosis mid PLV branch (possible culprit for unstable angina).  Intervention  No interventions have been documented. Right Heart  Right Heart Pressures RHC Procedural Findings: Hemodynamics (mmHg) RA 9 RV 37/10 PA 43/12, mean 25 PCWP mean 19 LV 96/20 AO 97/52  Oxygen saturations: PA 53% AO 98%  Cardiac Output (Fick) 3.05  Cardiac Index (Fick) 1.83  Cardiac Output (thermo) 2.48 Cardiac Index (thermo) 1.49      Objective:   Weight Range: 70.5 kg Body mass index is 28.43 kg/m.   Vital Signs:   Temp:  [98 F (36.7 C)] 98 F (36.7 C) (08/14 0015) Pulse Rate:  [0-79] 79 (08/14 0015) Resp:  [0-56] 18 (08/14 0015) BP: (108-127)/(61-80) 115/61 (08/14 0015) SpO2:  [0 %-98 %] 93 % (08/14 0015) Arterial Line BP:  (125)/(57) 125/57 (08/14 0015) Last BM Date: 12/19/19  Weight change: Filed Weights   12/16/19 0900 12/17/19 0404 12/19/19 0500  Weight: 63.5 kg 66.2 kg 70.5 kg    Intake/Output:   Intake/Output Summary (Last 24 hours) at 12/20/2019 1120 Last data filed at 12/19/2019 2359 Gross per 24 hour  Intake 750.39 ml  Output 180 ml  Net 570.39 ml      Physical Exam    GEN: Well nourished, well developed in no acute distress NECK: JVD not visible at 60 degrees CARDIAC: regular rhythm, normal S1 and S2, no rubs or gallops. No murmur. VASCULAR: Radial pulses 2+ bilaterally.  RESPIRATORY:  Disposable stethoscope makes good auscultation difficult, but mildly coarse at bases. ABDOMEN: Soft, non-tender, non-distended MUSCULOSKELETAL:  Moves all 4 limbs independently SKIN: Warm and dry, no significant LE edema. Unna boots in place NEUROLOGIC:  No focal neuro deficits noted. PSYCHIATRIC:  Normal affect    Telemetry   Normal sinus with rare PVCs, personally reviewed.   Labs    CBC Recent Labs    12/19/19 0348 12/19/19 1511 12/19/19 2120 12/20/19 0631  WBC 3.3*  --  1.6* 2.3*  NEUTROABS 2.0  --   --  1.8  HGB 11.9*   < > 11.5* 11.2*  HCT 39.1   < > 38.2 36.8  MCV 89.7  --  89.9 89.1  PLT 128*  --  126* 112*   < > = values in this interval not displayed.   Basic Metabolic Panel Recent Labs  12/19/19 2120 12/20/19 0631  NA 132* 132*  K 4.0 3.9  CL 95* 95*  CO2 30 27  GLUCOSE 180* 196*  BUN 30* 34*  CREATININE 1.41* 1.65*  CALCIUM 7.7* 7.5*   Liver Function Tests Recent Labs    12/19/19 0348 12/20/19 0631  AST 43* 41  ALT 17 19  ALKPHOS 66 56  BILITOT 0.8 0.8  PROT 6.2* 5.3*  ALBUMIN 2.8* 2.4*   No results for input(s): LIPASE, AMYLASE in the last 72 hours. Cardiac Enzymes No results for input(s): CKTOTAL, CKMB, CKMBINDEX, TROPONINI in the last 72 hours.  BNP: BNP (last 3 results) Recent Labs    12/14/19 1355 12/14/19 2302 12/17/19 0255  BNP  4,206.6* 3,317.2* 1,726.9*    ProBNP (last 3 results) No results for input(s): PROBNP in the last 8760 hours.   D-Dimer Recent Labs    12/19/19 0348 12/20/19 0631  DDIMER 0.86* 0.90*   Hemoglobin A1C No results for input(s): HGBA1C in the last 72 hours. Fasting Lipid Panel No results for input(s): CHOL, HDL, LDLCALC, TRIG, CHOLHDL, LDLDIRECT in the last 72 hours. Thyroid Function Tests No results for input(s): TSH, T4TOTAL, T3FREE, THYROIDAB in the last 72 hours.  Invalid input(s): FREET3  Other results:   Imaging    CARDIAC CATHETERIZATION  Result Date: 12/19/2019  DIAGNOSTIC IMAGING: Dr. Shirlee LatchMcLean  Lesion #2: Previously placed Mid RCA-2 stent (DES) is patent with exception of a: Mid RCA-3 lesion is 85% focal in-stent restenosis  Lesion #3: Previously placed Prox RCA stent (DES) is widely patent up until the focal Mid RCA-1 lesion is 60% stenosed-in-stent restenosis:.  Lesion #1: 2nd RPL lesion is 99% stenosed.  --RCA  Distal RPAV lesion is 100% stenosed - know occlusion.  --LCA  Ost LAD to Prox LAD lesion is 80% stenosed. Prox LAD to Mid LAD lesion is 95% stenosed. Distal competitive flow from LIMA (not visualized)  Ost Cx to Prox Cx lesion is 95% stenosed.  Ramus lesion is 80% stenosed.  Mid Cx lesion is 90% stenosed.  -- GRAFTS  SVG-RI & SVG-OM grafts were visualized by angiography. Both grafts are normal in caliber and exhibit no disease.  LIMA-LAD graft was not visualized due to inability to cannulate.  SVG-rPDA Graft was not visualized due to known occlusion. Origin lesion is 100% stenosed.  --------------------------------------------------------------------------- -------  ----------------- PCI: CULPRIT LESIONS in native RCA (ISR) and RPL 2 (new lesion) -----Dr. Herbie BaltimoreHarding ------------------------------  Lesion #3: Previously placed Prox RCA stent (DES) is widely patent up until the focal Mid RCA-1 lesion is 60% stenosed-in-stent restenosis:.  Lesion #2:  Previously placed Mid RCA-2 stent (DES) is patent with exception of a: Mid RCA-3 lesion is 85% focal in-stent restenosis  Scoring balloon angioplasty was performed on both in-stent stenosis lesions, using a BALLOON WOL[place]INE 2.50X10. Followed by high-pressure [place] balloon angioplasty, using a BALLOON SAPPHIRE [place] 3.0X12.  Post intervention, there is a 0% residual stenosis for both lesions #2 and 3 as well as the the intervening stented segment.  ---  Lesion #1: 2nd RPL lesion is 99% stenosed.  A drug-eluting stent was successfully placed using a STENT RESOLUTE ONYX 2.0X12. Postdilated to 2.2 mm  Post intervention, there is a 0% residual stenosis.   Successful DES PCI of 90% proximal RPL 2 stenosis reduced to 0% using Resolute Onyx DES 2.0 mm at 12 mm postdilated to 2.2 mm  Successful 2.5 mm Wolverine scoring balloon and high-pressure 3.0 mm Mazon balloon PTCA of distal half of the stented mid RCA  segment reducing to 0-5%--lesions #2 and #3 with intervening stented segment treated as well. PLAN OF CARE: Transfer back to nursing unit for ongoing care; per Dr. Shirlee Latch, plan will be to start milrinone infusion.  She was loaded with clopidogrel 600 mg pre-PCI, will start 75 mg daily.  Is currently on aspirin and Eliquis, okay to stop aspirin if necessary for bleeding concerns, but restart Eliquis tomorrow morning  Bryan Lemma, MD  CARDIAC CATHETERIZATION  Result Date: 12/19/2019 1. Mildly elevated right and left heart filling pressures. 2. Low cardiac output. 3. Stable anatomy from prior except for 80% mRCA stenosis and likely culprit 99% mid PLV stenosis. Discussed with Dr. Herbie Baltimore, plan intervention to PLV and mRCA.  Will start on milrinone 0.25 mcg/kg/min for now post-PCI to optimize cardiac output.  Will give very gentle IV fluid post-procedure today, will likely need some diuresis tomorrow.  Will place PICC.   DG CHEST PORT 1 VIEW  Result Date: 12/19/2019 CLINICAL DATA:  Shortness of breath EXAM:  PORTABLE CHEST 1 VIEW COMPARISON:  12/17/2019 FINDINGS: Right PICC line has been placed with the tip at the cavoatrial junction. Left AICD is unchanged. Prior CABG. Cardiomegaly. Bilateral airspace opacities, most confluent in the right upper lobe, worsening since prior study. Continued left lower lobe airspace opacity, unchanged. No effusions. IMPRESSION: Right PICC line tip at the cavoatrial junction. Bilateral airspace opacities could reflect edema or infection. Electronically Signed   By: Charlett Nose M.D.   On: 12/19/2019 20:13   [place] EKG SITE RITE  Result Date: 12/19/2019 If Site Rite image not attached, placement could not be confirmed due to current cardiac rhythm.    Medications:     Scheduled Medications: . amiodarone  100 mg Oral Daily  . apixaban  5 mg Oral BID  . vitamin C  500 mg Oral Daily  . aspirin  81 mg Oral Daily  . Chlorhexidine Gluconate Cloth  6 each Topical Daily  . cholecalciferol  1,000 Units Oral Daily  . clopidogrel  75 mg Oral Q breakfast  . ezetimibe  10 mg Oral QHS  . feeding supplement (ENSURE ENLIVE)  237 mL Oral Daily  . insulin aspart  0-15 Units Subcutaneous TID WC  . Ipratropium-Albuterol  1 puff Inhalation QID  . levothyroxine  50 mcg Oral Q0600  . methylPREDNISolone (SOLU-MEDROL) injection  40 mg Intravenous BID  . pantoprazole  40 mg Oral Q1200  . simvastatin  40 mg Oral QHS  . sodium chloride flush  10-40 mL Intracatheter Q12H  . sodium chloride flush  3 mL Intravenous Q12H  . sodium chloride flush  3 mL Intravenous Q12H  . sodium chloride flush  3 mL Intravenous Q12H  . sodium chloride flush  3 mL Intravenous Q12H  . spironolactone  12.5 mg Oral Daily  . venlafaxine XR  150 mg Oral QHS  . vitamin B-12  1,000 mcg Oral Daily  . zinc sulfate  220 mg Oral Daily    Infusions: . sodium chloride    . milrinone 0.25 mcg/kg/min (12/19/19 2054)  . remdesivir 100 mg in NS 100 mL 100 mg (12/20/19 0915)    PRN Medications: sodium chloride,  acetaminophen, guaiFENesin-dextromethorphan, nitroGLYCERIN, ondansetron (ZOFRAN) IV, sodium chloride flush, sodium chloride flush, traMADol    Assessment/Plan   1. COVID-19 infection:  -Covid + 12/14/19 -received 1 dose of Pfizer vaccine, missed 2nd dose -management per primary team  -CRP, d dimer remain elevated  2. Acute on chronic systolic CHF:  -History of ischemic cardiomyopathy.  MDT ICD.  Echo this admission with fall in EF to 15-20%, global hypokinesis, moderate LV dilation, severely decreased RV function, severe MR, moderate-severe TR.  RHC today with mildly elevated filling pressures, cardiac output low.  .  -Coox this AM 68.8. Yesterday was 54, PA sat 53% on RHC. Wedge was 19. CI was 1.83. -Cr up from 1.41 yesterday, to 1.65, in line with prior Cr before cath. Holding losartan but continuing spironolactone. K 3.9 -on milrinone 0.25 mcg/kg/min -Patient has LBBB but not wide enough (129 msec) to benefit much from CRT.  -With frailty, not a good candidate for advanced therapies. -warm, well perfused on exam, nearly euvolemic clinically but elevated LVEDP on RHC 12/19/19. Low cardiac index and elevated LVEDP with low O2 sats, consistent with cardiogenic shock, improving with IV inotrope -continue current therapies  3. CAD:  -CABG x 4 2008.  -NSTEMI 05/2017>>LHC w/ occluded SVG-PDA and serial 70% and 90% stenoses in the proximal to mid RCA, treated w/ DES x 2 to RCA.   -coronary angiography 12/19/19 with stable anatomy except for 80% ISR mid RCA, 99% mid PLV stenosis (likely culprit).  -12/19/19 s/p balloon angioplasty to both proximal and mid RCA stents with good results. PCI to 2nd RPL with 2.0x12 resolute onyx -continue aspirin (short course), clopidogrel for PCI. Given long term need to be on anticoagulation, should not be on aspirin long term. H/H stable today - Continue statin, well controlled with LDL 21.   4. UTI: Treating with ceftriaxone.   5. Type 2 diabetes: Would benefit  from dapagliflozin eventually.   6. 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   7. Atrial fibrillation: Paroxysmal. -continue amiodarone.  -restarting apixaban today, monitor for bleeding  Length of Stay: 5  Jodelle Red, MD  12/20/2019, 11:20 AM  Advanced Heart Failure Team Pager [phone] (M-F; 7a - 4p)  Please contact [facility] for night-coverage after hours (4p -7a ) and weekends on [email or web address]

## 2019-12-21 DIAGNOSIS — I480 Paroxysmal atrial fibrillation: Secondary | ICD-10-CM | POA: Diagnosis not present

## 2019-12-21 DIAGNOSIS — I5043 Acute on chronic combined systolic (congestive) and diastolic (congestive) heart failure: Secondary | ICD-10-CM | POA: Diagnosis not present

## 2019-12-21 DIAGNOSIS — R570 Cardiogenic shock: Secondary | ICD-10-CM | POA: Diagnosis not present

## 2019-12-21 DIAGNOSIS — N179 Acute kidney failure, unspecified: Secondary | ICD-10-CM

## 2019-12-21 DIAGNOSIS — I208 Other forms of angina pectoris: Secondary | ICD-10-CM | POA: Diagnosis not present

## 2019-12-21 DIAGNOSIS — Z9861 Coronary angioplasty status: Secondary | ICD-10-CM | POA: Diagnosis not present

## 2019-12-21 DIAGNOSIS — U071 COVID-19: Secondary | ICD-10-CM | POA: Diagnosis not present

## 2019-12-21 DIAGNOSIS — Z955 Presence of coronary angioplasty implant and graft: Secondary | ICD-10-CM

## 2019-12-21 LAB — COOXEMETRY PANEL
Carboxyhemoglobin: 1.6 % — ABNORMAL HIGH (ref 0.5–1.5)
Methemoglobin: 1 % (ref 0.0–1.5)
O2 Saturation: 74.2 %
Total hemoglobin: 11.4 g/dL — ABNORMAL LOW (ref 12.0–16.0)

## 2019-12-21 LAB — CBC WITH DIFFERENTIAL/PLATELET
Abs Immature Granulocytes: 0.07 10*3/uL (ref 0.00–0.07)
Basophils Absolute: 0 10*3/uL (ref 0.0–0.1)
Basophils Relative: 0 %
Eosinophils Absolute: 0 10*3/uL (ref 0.0–0.5)
Eosinophils Relative: 0 %
HCT: 34.3 % — ABNORMAL LOW (ref 36.0–46.0)
Hemoglobin: 10.7 g/dL — ABNORMAL LOW (ref 12.0–15.0)
Immature Granulocytes: 1 %
Lymphocytes Relative: 4 %
Lymphs Abs: 0.2 10*3/uL — ABNORMAL LOW (ref 0.7–4.0)
MCH: 26.7 pg (ref 26.0–34.0)
MCHC: 31.2 g/dL (ref 30.0–36.0)
MCV: 85.5 fL (ref 80.0–100.0)
Monocytes Absolute: 0.4 10*3/uL (ref 0.1–1.0)
Monocytes Relative: 7 %
Neutro Abs: 4.5 10*3/uL (ref 1.7–7.7)
Neutrophils Relative %: 88 %
Platelets: 144 10*3/uL — ABNORMAL LOW (ref 150–400)
RBC: 4.01 MIL/uL (ref 3.87–5.11)
RDW: 15.5 % (ref 11.5–15.5)
WBC: 5.1 10*3/uL (ref 4.0–10.5)
nRBC: 0 % (ref 0.0–0.2)

## 2019-12-21 LAB — BASIC METABOLIC PANEL
Anion gap: 14 (ref 5–15)
Anion gap: 12 (ref 5–15)
BUN: 59 mg/dL — ABNORMAL HIGH (ref 8–23)
BUN: 63 mg/dL — ABNORMAL HIGH (ref 8–23)
CO2: 23 mmol/L (ref 22–32)
CO2: 26 mmol/L (ref 22–32)
Calcium: 7.3 mg/dL — ABNORMAL LOW (ref 8.9–10.3)
Calcium: 7.5 mg/dL — ABNORMAL LOW (ref 8.9–10.3)
Chloride: 94 mmol/L — ABNORMAL LOW (ref 98–111)
Chloride: 92 mmol/L — ABNORMAL LOW (ref 98–111)
Creatinine, Ser: 3.11 mg/dL — ABNORMAL HIGH (ref 0.44–1.00)
Creatinine, Ser: 3.56 mg/dL — ABNORMAL HIGH (ref 0.44–1.00)
GFR calc Af Amer: 16 mL/min — ABNORMAL LOW (ref >60)
GFR calc Af Amer: 14 mL/min — ABNORMAL LOW (ref >60)
GFR calc non Af Amer: 14 mL/min — ABNORMAL LOW (ref >60)
GFR calc non Af Amer: 12 mL/min — ABNORMAL LOW (ref >60)
Glucose, Bld: 299 mg/dL — ABNORMAL HIGH (ref 70–99)
Glucose, Bld: 407 mg/dL — ABNORMAL HIGH (ref 70–99)
Potassium: 4.5 mmol/L (ref 3.5–5.1)
Potassium: 4.6 mmol/L (ref 3.5–5.1)
Sodium: 131 mmol/L — ABNORMAL LOW (ref 135–145)
Sodium: 130 mmol/L — ABNORMAL LOW (ref 135–145)

## 2019-12-21 LAB — GLUCOSE, CAPILLARY
Glucose-Capillary: 273 mg/dL — ABNORMAL HIGH (ref 70–99)
Glucose-Capillary: 287 mg/dL — ABNORMAL HIGH (ref 70–99)
Glucose-Capillary: 330 mg/dL — ABNORMAL HIGH (ref 70–99)

## 2019-12-21 LAB — PROCALCITONIN: Procalcitonin: 0.85 ng/mL

## 2019-12-21 LAB — C-REACTIVE PROTEIN: CRP: 2.5 mg/dL — ABNORMAL HIGH (ref <1.0)

## 2019-12-21 MED ORDER — ALBUMIN HUMAN 25 % IV SOLN
12.5000 g | Freq: Once | INTRAVENOUS | Status: AC
  Administered 2019-12-21: 12.5 g via INTRAVENOUS
  Filled 2019-12-21: qty 50

## 2019-12-21 MED ORDER — HEPARIN SODIUM (PORCINE) 5000 UNIT/ML IJ SOLN
5000.0000 [IU] | Freq: Three times a day (TID) | INTRAMUSCULAR | Status: DC
  Administered 2019-12-21 – 2019-12-24 (×8): 5000 [IU] via SUBCUTANEOUS
  Filled 2019-12-21 (×8): qty 1

## 2019-12-21 MED ORDER — SODIUM CHLORIDE 0.9 % IV SOLN
2.0000 g | INTRAVENOUS | Status: DC
  Administered 2019-12-21 – 2019-12-22 (×2): 2 g via INTRAVENOUS
  Filled 2019-12-21: qty 2
  Filled 2019-12-21: qty 20
  Filled 2019-12-21: qty 2

## 2019-12-21 MED ORDER — INSULIN ASPART 100 UNIT/ML ~~LOC~~ SOLN
3.0000 [IU] | Freq: Three times a day (TID) | SUBCUTANEOUS | Status: DC
  Administered 2019-12-21 – 2019-12-22 (×4): 3 [IU] via SUBCUTANEOUS

## 2019-12-21 NOTE — Progress Notes (Addendum)
[facility] Progress Note  Patient: [name]    OVZ:[id number]  DOA: 12/14/2019     Date of Service: the patient was seen and examined on 12/21/2019  Brief hospital course: Past medical history of chronic systolic CHF, SP AICD implant, PAF on Eliquis, type II DM, partially vaccinated.  Presents with shortness of breath and swelling in the leg with chest pain found to have acute decompensated systolic CHF.  Tested positive for Covid.  Started having fever on 8/11//2021.  Currently plan is continue treating Covid and further work-up for CHF per cardiology.  Assessment and Plan: 1.  Acute hypoxic respiratory failure CAD SP DES of proximal RPL 2 PTCA of the mid RCA on 12/19/2019 History of CABG 2008 Acute on chronic combined systolic and diastolic CHF Moderate bilateral pleural effusion Med[employer] ICD Chest pain/angina Found to have significant volume overload consistent with decompensated systolic CHF. Cardiology was consulted. Patient was started on aggressive IV diuresis. Underwent left heart cath with DES placement on 12/19/2019 as well as balloon angioplasty. Patient on Primacor after right heart cath. PICC line ordered by cardiology. Patient was on IV heparin for ACS protocol which is currently discontinued due to hematuria.  2.  COVID-19 pneumonia Acute COVID-19 Viral illness Bacterial community-acquired pneumonia Lab Results  Component Value Date   SARSCOV2NAA POSITIVE (A) 12/14/2019   SARSCOV2NAA NEGATIVE 10/29/2019   CXR: hazy bilateral peripheral opacities  Recent Labs    12/19/19 0348 12/20/19 0631 12/21/19 0644  DDIMER 0.86* 0.90*  --   FERRITIN 226 288  --   CRP 4.4* 4.2* 2.5*   Tmax last 24 hours:  Temp (24hrs), Avg:98 F (36.7 C), Min:97.8 F (36.6 C), Max:98.3 F (36.8 C)  Oxygen requirements: Currently on 4 LPM Diuretics: Intermittent diuresis Vitamin C and Zinc: Continue DVT Prophylaxis: heparin injection 5,000 Units Start: 12/21/19  2200 Remdesivir: Started on 12/17/2019 Antibiotic: started for total of 5 days on 12/20/2019. Prone positioning: Patient encouraged to stay in prone position as much as possible.  3.  Hematuria In the setting of being on IV heparin. CT renal protocol negative for any acute abnormality.  H&H stable.  4.  Acute metabolic encephalopathy from hypoglycemia From poor p.o. intake due to n.p.o. status. Currently resolved.  5.  Type 2 diabetes mellitus with uncontrolled hyper and hypoglycemia and renal complication Blood sugar fluctuating significantly. Continue sliding scale insulin.  Monitor.  6.  Paroxysmal A. Fib Essential hypertension Currently rate controlled. Continue amiodarone and Coreg. Initially anticoagulated with Eliquis currently on due to hematuria. Continue losartan and Coreg.  7.  Acute kidney injury on CKD stage IIIa. Serum creatinine significantly elevated. Cardiology currently holding diuresis. Monitor response. Will support with albumin. Also contrast-induced nephropathy cannot be ruled out. Renal function does not improve tomorrow will initiate further work-up. May require nephrology consultation Continue to monitor closely.  8.  UTI. Treated with Rocephin. Currently no symptoms.  9.  Hypothyroidism. TSH stable. Continue Synthroid.  10.  Mild hyperkalemia. Repeat potassium stable. Continue to monitor.  11. Leukopenia Acute thrombocytopenia In the setting of COVID-19.  Monitor.  Diet: Cardiac diet DVT Prophylaxis:       Advance goals of care discussion: Full code  Family Communication: no family was present at bedside, at the time of interview.  Discussed with daughter on the phone. The pt provided permission to discuss medical plan with the family. Opportunity was given to ask question and all questions were answered satisfactorily.   Disposition:  Status is: Inpatient  Remains  inpatient appropriate because:Ongoing diagnostic testing needed not  appropriate for outpatient work up and IV treatments appropriate due to intensity of illness or inability to take PO   Dispo: The patient is from: Home              Anticipated d/c is to: SNF              Anticipated d/c date is: 2 days              Patient currently is not medically stable to d/c.  Subjective: Continues to have shortness of breath and cough.  No more fever.  No further confusion.  No nausea no vomiting.  No fever no chills.  Physical Exam:  General: Appear in mild distress, no Rash; Oral Mucosa Clear, moist. no Abnormal Neck Mass Or lumps, Conjunctiva normal  Cardiovascular: S1 and S2 Present, no Murmur, Respiratory: increased respiratory effort, Bilateral Air entry present and bilateral  Crackles, bilateral expiratory wheezes Abdomen: Bowel Sound present, Soft and no tenderness Extremities: bilateral Pedal edema, no calf tenderness Neurology: alert and oriented to time, place, and person, mildly delirious affect anxious. no new focal deficit Gait not checked due to patient safety concerns  Vitals:   12/20/19 2345 12/21/19 0533 12/21/19 0739 12/21/19 1455  BP: 104/62  (!) 98/48 103/75  Pulse: 86  84 88  Resp: 18  20 18   Temp: 98.3 F (36.8 C) 97.8 F (36.6 C)  98.1 F (36.7 C)  TempSrc:  Oral  Oral  SpO2: 90%  (!) 87% 92%  Weight:  64 kg    Height:        Intake/Output Summary (Last 24 hours) at 12/21/2019 1749 Last data filed at 12/21/2019 1230 Gross per 24 hour  Intake 677 ml  Output --  Net 677 ml   Filed Weights   12/17/19 0404 12/19/19 0500 12/21/19 0533  Weight: 66.2 kg 70.5 kg 64 kg    Data Reviewed: I have personally reviewed and interpreted daily labs, tele strips, imagings as discussed above. I reviewed all nursing notes, pharmacy notes, vitals, pertinent old records I have discussed plan of care as described above with RN and patient/family.  CBC: Recent Labs  Lab 12/17/19 0312 12/17/19 0312 12/18/19 0346 12/18/19 0346  12/18/19 1829 12/18/19 1829 12/19/19 0348 12/19/19 0348 12/19/19 1511 12/19/19 1512 12/19/19 2120 12/20/19 0631 12/21/19 0644  WBC 4.2   < > 4.3   < > 4.4  --  3.3*  --   --   --  1.6* 2.3* 5.1  NEUTROABS 2.8  --  2.5  --   --   --  2.0  --   --   --   --  1.8 4.5  HGB 11.0*   < > 12.9   < > 12.6   < > 11.9*   < > 12.6 12.6 11.5* 11.2* 10.7*  HCT 36.1   < > 42.3   < > 42.8   < > 39.1   < > 37.0 37.0 38.2 36.8 34.3*  MCV 88.3   < > 90.8   < > 90.1  --  89.7  --   --   --  89.9 89.1 85.5  PLT 190   < > 155   < > 144*  --  128*  --   --   --  126* 112* 144*   < > = values in this interval not displayed.   Basic Metabolic Panel: Recent Labs  Lab 12/17/19  7510 12/18/19 0346 12/19/19 0348 12/19/19 1511 12/19/19 1512 12/19/19 2120 12/20/19 0631 12/20/19 1356 12/21/19 0935  NA 134*   < > 138   < > 136 132* 132* 134* 131*  K 3.9   < > 4.9   < > 4.0 4.0 3.9 3.7 4.5  CL 95*   < > 94*  --   --  95* 95* 95* 94*  CO2 28   < > 30  --   --  30 27 27 23   GLUCOSE 92   < > 90  --   --  180* 196* 231* 299*  BUN 21   < > 29*  --   --  30* 34* 39* 59*  CREATININE 1.31*   < > 1.62*  --   --  1.41* 1.65* 2.01* 3.11*  CALCIUM 8.0*   < > 8.1*  --   --  7.7* 7.5* 7.7* 7.3*  MG 1.7  --   --   --   --   --   --   --   --    < > = values in this interval not displayed.    Studies: No results found.  Scheduled Meds: . amiodarone  100 mg Oral Daily  . vitamin C  500 mg Oral Daily  . aspirin  81 mg Oral Daily  . azithromycin  500 mg Oral Daily  . Chlorhexidine Gluconate Cloth  6 each Topical Daily  . cholecalciferol  1,000 Units Oral Daily  . clopidogrel  75 mg Oral Q breakfast  . ezetimibe  10 mg Oral QHS  . feeding supplement (ENSURE ENLIVE)  237 mL Oral Daily  . heparin injection (subcutaneous)  5,000 Units Subcutaneous Q8H  . insulin aspart  0-15 Units Subcutaneous TID WC  . insulin aspart  3 Units Subcutaneous TID WC  . Ipratropium-Albuterol  1 puff Inhalation QID  . levothyroxine  50  mcg Oral Q0600  . methylPREDNISolone (SOLU-MEDROL) injection  40 mg Intravenous BID  . pantoprazole  40 mg Oral Q1200  . simvastatin  40 mg Oral QHS  . sodium chloride flush  10-40 mL Intracatheter Q12H  . sodium chloride flush  3 mL Intravenous Q12H  . sodium chloride flush  3 mL Intravenous Q12H  . sodium chloride flush  3 mL Intravenous Q12H  . sodium chloride flush  3 mL Intravenous Q12H  . venlafaxine XR  150 mg Oral QHS  . vitamin B-12  1,000 mcg Oral Daily  . zinc sulfate  220 mg Oral Daily   Continuous Infusions: . sodium chloride    . albumin human    . cefTRIAXone (ROCEPHIN)  IV 2 g (12/21/19 1747)  . remdesivir 100 mg in NS 100 mL 100 mg (12/21/19 0912)   PRN Meds: sodium chloride, acetaminophen, alum & mag hydroxide-simeth, guaiFENesin-dextromethorphan, nitroGLYCERIN, ondansetron (ZOFRAN) IV, sodium chloride flush, sodium chloride flush, traMADol  Time spent: 35 minutes  Author: 01/04/2020, MD [facility] 12/21/2019 5:49 PM  To reach On-call, see care teams to locate the attending and reach out via [email or web address]. Between 7PM-7AM, please contact night-coverage If you still have difficulty reaching the attending provider, please page the [facility] (Director on Call) for [facility] on amion for assistance.  The treatment plan and use of medications and known side effects were discussed with patient/family. It was clearly explained that there is no proven definitive treatment for COVID-19 infection yet. Any medications used here are based on case reports/anecdotal data which are not peer-reviewed  and has not been studied using randomized control trials.  Complete risks and long-term side effects are unknown, however in the best clinical judgment they seem to be of some clinical benefit rather than medical risks.  Patient/family agree with the treatment plan and want to receive these treatments as indicated.

## 2019-12-21 NOTE — Progress Notes (Signed)
Patient ID: [name], female   DOB: [birth year], 74 y.o.   MRN: [id number]     Advanced Heart Failure Rounding Note  PCP-Cardiologist: Chrystie Nose, MD   Subjective:    Given Covid-19 positive, full PPE and donning/doffing protocols used for face to face interaction.  Breathing is short today, feels weak. Has an all over body itching after dinner. No chest pain. Wants to get up and walk to keep her strength.  Coronary Findings  Diagnostic Dominance: Right Left Main  Short, no severe disease.  Left Anterior Descending  95% mid LAD with competitive flow from LIMA. LIMA not engaged => right-sided aortic arch, unable to cannulate left subclavian (same problem on prior cath). Radial artery not used given very weak/thready pulse and small arm.  Left Circumflex  Diffusely disease proximally up to 95%. SVG-OM1 and SVG-OM2 patent.  Right Coronary Artery  Known occlusion of the SVG-RCA. Long stented segment in proximal to mid RCA. 80% in-stent restenosis in the mid RCA. 99% stenosis mid PLV branch (possible culprit for unstable angina).  Intervention  No interventions have been documented. Right Heart  Right Heart Pressures RHC Procedural Findings: Hemodynamics (mmHg) RA 9 RV 37/10 PA 43/12, mean 25 PCWP mean 19 LV 96/20 AO 97/52  Oxygen saturations: PA 53% AO 98%  Cardiac Output (Fick) 3.05  Cardiac Index (Fick) 1.83  Cardiac Output (thermo) 2.48 Cardiac Index (thermo) 1.49      Objective:   Weight Range: 64 kg Body mass index is 25.81 kg/m.   Vital Signs:   Temp:  [97.8 F (36.6 C)-98.3 F (36.8 C)] 97.8 F (36.6 C) (08/15 0533) Pulse Rate:  [84-86] 84 (08/15 0739) Resp:  [18-20] 20 (08/15 0739) BP: (98-104)/(48-62) 98/48 (08/15 0739) SpO2:  [87 %-92 %] 87 % (08/15 0739) Weight:  [64 kg] 64 kg (08/15 0533) Last BM Date: 12/20/19  Weight change: Filed Weights   12/17/19 0404 12/19/19 0500 12/21/19 0533  Weight: 66.2 kg 70.5 kg 64 kg     Intake/Output:   Intake/Output Summary (Last 24 hours) at 12/21/2019 1445 Last data filed at 12/21/2019 0934 Gross per 24 hour  Intake 20 ml  Output 250 ml  Net -230 ml      Physical Exam    GEN: Well nourished, well developed in no acute distress NECK: JVD not visible at 60 degrees CARDIAC: regular rhythm, normal S1 and S2, no rubs or gallops. No murmur. VASCULAR: Radial pulses 2+ bilaterally.  RESPIRATORY:  Disposable stethoscope makes good auscultation difficult, but mildly coarse at bases. ABDOMEN: Soft, non-tender, non-distended MUSCULOSKELETAL:  Moves all 4 limbs independently SKIN: Warm and dry, no significant LE edema. Unna boots in place NEUROLOGIC:  No focal neuro deficits noted. PSYCHIATRIC:  Normal affect   GEN: Well nourished, well developed in no acute distress HEENT: Normal, moist mucous membranes NECK: No JVD CARDIAC: regular rhythm, normal S1 and S2, no rubs or gallops. No murmur. VASCULAR: Radial and DP pulses 2+ bilaterally. No carotid bruits RESPIRATORY:  Diminished breath sounds, diffusely coarse  ABDOMEN: Soft, non-tender, non-distended MUSCULOSKELETAL:  Ambulates independently SKIN: Warm and dry, no edema. Unna boots in place. Toes, feet warm, well perfused. NEUROLOGIC:  Alert and oriented x 3. No focal neuro deficits noted. PSYCHIATRIC:  Normal affect    Telemetry   Normal sinus, personally reviewed.   Labs    CBC Recent Labs    12/20/19 0631 12/21/19 0644  WBC 2.3* 5.1  NEUTROABS 1.8 4.5  HGB 11.2* 10.7*  HCT  36.8 34.3*  MCV 89.1 85.5  PLT 112* 144*   Basic Metabolic Panel Recent Labs    99/24/26 1356 12/21/19 0935  NA 134* 131*  K 3.7 4.5  CL 95* 94*  CO2 27 23  GLUCOSE 231* 299*  BUN 39* 59*  CREATININE 2.01* 3.11*  CALCIUM 7.7* 7.3*   Liver Function Tests Recent Labs    12/19/19 0348 12/20/19 0631  AST 43* 41  ALT 17 19  ALKPHOS 66 56  BILITOT 0.8 0.8  PROT 6.2* 5.3*  ALBUMIN 2.8* 2.4*   No results for  input(s): LIPASE, AMYLASE in the last 72 hours. Cardiac Enzymes No results for input(s): CKTOTAL, CKMB, CKMBINDEX, TROPONINI in the last 72 hours.  BNP: BNP (last 3 results) Recent Labs    12/14/19 1355 12/14/19 2302 12/17/19 0255  BNP 4,206.6* 3,317.2* 1,726.9*    ProBNP (last 3 results) No results for input(s): PROBNP in the last 8760 hours.   D-Dimer Recent Labs    12/19/19 0348 12/20/19 0631  DDIMER 0.86* 0.90*   Hemoglobin A1C No results for input(s): HGBA1C in the last 72 hours. Fasting Lipid Panel No results for input(s): CHOL, HDL, LDLCALC, TRIG, CHOLHDL, LDLDIRECT in the last 72 hours. Thyroid Function Tests No results for input(s): TSH, T4TOTAL, T3FREE, THYROIDAB in the last 72 hours.  Invalid input(s): FREET3  Other results:   Imaging    No results found.   Medications:     Scheduled Medications: . amiodarone  100 mg Oral Daily  . vitamin C  500 mg Oral Daily  . aspirin  81 mg Oral Daily  . azithromycin  500 mg Oral Daily  . Chlorhexidine Gluconate Cloth  6 each Topical Daily  . cholecalciferol  1,000 Units Oral Daily  . clopidogrel  75 mg Oral Q breakfast  . ezetimibe  10 mg Oral QHS  . feeding supplement (ENSURE ENLIVE)  237 mL Oral Daily  . insulin aspart  0-15 Units Subcutaneous TID WC  . insulin aspart  3 Units Subcutaneous TID WC  . Ipratropium-Albuterol  1 puff Inhalation QID  . levothyroxine  50 mcg Oral Q0600  . methylPREDNISolone (SOLU-MEDROL) injection  40 mg Intravenous BID  . pantoprazole  40 mg Oral Q1200  . simvastatin  40 mg Oral QHS  . sodium chloride flush  10-40 mL Intracatheter Q12H  . sodium chloride flush  3 mL Intravenous Q12H  . sodium chloride flush  3 mL Intravenous Q12H  . sodium chloride flush  3 mL Intravenous Q12H  . sodium chloride flush  3 mL Intravenous Q12H  . venlafaxine XR  150 mg Oral QHS  . vitamin B-12  1,000 mcg Oral Daily  . zinc sulfate  220 mg Oral Daily    Infusions: . sodium chloride      . cefTRIAXone (ROCEPHIN)  IV    . remdesivir 100 mg in NS 100 mL 100 mg (12/21/19 0912)    PRN Medications: sodium chloride, acetaminophen, alum & mag hydroxide-simeth, guaiFENesin-dextromethorphan, nitroGLYCERIN, ondansetron (ZOFRAN) IV, sodium chloride flush, sodium chloride flush, traMADol    Assessment/Plan   COVID-19 infection:  -Covid + 12/14/19 -received 1 dose of Pfizer vaccine, missed 2nd dose -management per primary team. Intensifying regimen today given worsening oxygenation despite euvolemia  Acute on chronic systolic CHF:  -History of ischemic cardiomyopathy.  MDT ICD.  Echo this admission with fall in EF to 15-20%, global hypokinesis, moderate LV dilation, severely decreased RV function, severe MR, moderate-severe TR.  RHC 8/13 with mildly elevated  filling pressures, cardiac output low. -Coox this AM 74.2%. PA sat 53% on RHC. Wedge was 19. CI was 1.83. -CVP today 5 this AM, then 3. -Cr jumped significantly today, to 3.11. Was already holding losartan, will hold spironolactone and apixaban as well -on milrinone 0.25 mcg/kg/min, will stop given renal function increase today -liberalize oral fluid intake. Will hold on IV fluids for now, but if renal function worsens and she remains euvolemic, may need low rate of IV fluids. -Patient has LBBB but not wide enough (129 msec) to benefit much from CRT.  -With frailty, not a good candidate for advanced therapies. -warm, well perfused on exam, euvolemic today.  CAD:  -CABG x 4 2008.  -NSTEMI 05/2017>>LHC w/ occluded SVG-PDA and serial 70% and 90% stenoses in the proximal to mid RCA, treated w/ DES x 2 to RCA.   -coronary angiography 12/19/19 with stable anatomy except for 80% ISR mid RCA, 99% mid PLV stenosis (likely culprit).  -12/19/19 s/p balloon angioplasty to both proximal and mid RCA stents with good results. PCI to 2nd RPL with 2.0x12 resolute onyx -continue aspirin (short course), clopidogrel for PCI. Given long term need to  be on anticoagulation, should not be on aspirin long term. H/H down slightly today without apparent bleeding - Continue statin, well controlled with LDL 21.   Type 2 diabetes: Would benefit from dapagliflozin eventually, when renal function recovers  PVCs: Patient has history of frequent PVCs, now on amiodarone to suppress.   Atrial fibrillation: Paroxysmal. Has remained in sinus rhythm -continue amiodarone.  -given rise in creatinine, holding apixaban dose today  Length of Stay: 6  Jodelle Red, MD  12/21/2019, 2:45 PM  Advanced Heart Failure Team Pager [phone] (M-F; 7a - 4p)  Please contact [facility] for night-coverage after hours (4p -7a ) and weekends on [email or web address]

## 2019-12-22 ENCOUNTER — Encounter (HOSPITAL_COMMUNITY): Payer: Self-pay | Admitting: Cardiology

## 2019-12-22 ENCOUNTER — Inpatient Hospital Stay (HOSPITAL_COMMUNITY): Payer: Medicare Other

## 2019-12-22 DIAGNOSIS — I509 Heart failure, unspecified: Secondary | ICD-10-CM | POA: Diagnosis not present

## 2019-12-22 DIAGNOSIS — N179 Acute kidney failure, unspecified: Secondary | ICD-10-CM | POA: Diagnosis not present

## 2019-12-22 DIAGNOSIS — I5023 Acute on chronic systolic (congestive) heart failure: Secondary | ICD-10-CM | POA: Diagnosis not present

## 2019-12-22 LAB — BASIC METABOLIC PANEL
Anion gap: 15 (ref 5–15)
Anion gap: 14 (ref 5–15)
BUN: 72 mg/dL — ABNORMAL HIGH (ref 8–23)
BUN: 78 mg/dL — ABNORMAL HIGH (ref 8–23)
CO2: 24 mmol/L (ref 22–32)
CO2: 26 mmol/L (ref 22–32)
Calcium: 7.7 mg/dL — ABNORMAL LOW (ref 8.9–10.3)
Calcium: 8 mg/dL — ABNORMAL LOW (ref 8.9–10.3)
Chloride: 93 mmol/L — ABNORMAL LOW (ref 98–111)
Chloride: 94 mmol/L — ABNORMAL LOW (ref 98–111)
Creatinine, Ser: 4.25 mg/dL — ABNORMAL HIGH (ref 0.44–1.00)
Creatinine, Ser: 4.55 mg/dL — ABNORMAL HIGH (ref 0.44–1.00)
GFR calc Af Amer: 11 mL/min — ABNORMAL LOW (ref >60)
GFR calc Af Amer: 10 mL/min — ABNORMAL LOW (ref >60)
GFR calc non Af Amer: 10 mL/min — ABNORMAL LOW (ref >60)
GFR calc non Af Amer: 9 mL/min — ABNORMAL LOW (ref >60)
Glucose, Bld: 305 mg/dL — ABNORMAL HIGH (ref 70–99)
Glucose, Bld: 167 mg/dL — ABNORMAL HIGH (ref 70–99)
Potassium: 4.7 mmol/L (ref 3.5–5.1)
Potassium: 4.7 mmol/L (ref 3.5–5.1)
Sodium: 132 mmol/L — ABNORMAL LOW (ref 135–145)
Sodium: 134 mmol/L — ABNORMAL LOW (ref 135–145)

## 2019-12-22 LAB — CULTURE, BLOOD (ROUTINE X 2)
Culture: NO GROWTH
Culture: NO GROWTH
Special Requests: ADEQUATE

## 2019-12-22 LAB — CBC WITH DIFFERENTIAL/PLATELET
Abs Immature Granulocytes: 0.11 10*3/uL — ABNORMAL HIGH (ref 0.00–0.07)
Basophils Absolute: 0 10*3/uL (ref 0.0–0.1)
Basophils Relative: 0 %
Eosinophils Absolute: 0 10*3/uL (ref 0.0–0.5)
Eosinophils Relative: 0 %
HCT: 34.7 % — ABNORMAL LOW (ref 36.0–46.0)
Hemoglobin: 10.7 g/dL — ABNORMAL LOW (ref 12.0–15.0)
Immature Granulocytes: 1 %
Lymphocytes Relative: 2 %
Lymphs Abs: 0.2 10*3/uL — ABNORMAL LOW (ref 0.7–4.0)
MCH: 26.8 pg (ref 26.0–34.0)
MCHC: 30.8 g/dL (ref 30.0–36.0)
MCV: 86.8 fL (ref 80.0–100.0)
Monocytes Absolute: 0.4 10*3/uL (ref 0.1–1.0)
Monocytes Relative: 5 %
Neutro Abs: 8.2 10*3/uL — ABNORMAL HIGH (ref 1.7–7.7)
Neutrophils Relative %: 92 %
Platelets: 155 10*3/uL (ref 150–400)
RBC: 4 MIL/uL (ref 3.87–5.11)
RDW: 15.7 % — ABNORMAL HIGH (ref 11.5–15.5)
WBC: 8.9 10*3/uL (ref 4.0–10.5)
nRBC: 0 % (ref 0.0–0.2)

## 2019-12-22 LAB — COOXEMETRY PANEL
Carboxyhemoglobin: 1.3 % (ref 0.5–1.5)
Methemoglobin: 0.9 % (ref 0.0–1.5)
O2 Saturation: 60.8 %
Total hemoglobin: 11.2 g/dL — ABNORMAL LOW (ref 12.0–16.0)

## 2019-12-22 LAB — OSMOLALITY, URINE: Osmolality, Ur: 1098 mOsm/kg — ABNORMAL HIGH (ref 300–900)

## 2019-12-22 LAB — GLUCOSE, CAPILLARY
Glucose-Capillary: 293 mg/dL — ABNORMAL HIGH (ref 70–99)
Glucose-Capillary: 289 mg/dL — ABNORMAL HIGH (ref 70–99)
Glucose-Capillary: 147 mg/dL — ABNORMAL HIGH (ref 70–99)
Glucose-Capillary: 133 mg/dL — ABNORMAL HIGH (ref 70–99)

## 2019-12-22 LAB — C-REACTIVE PROTEIN: CRP: 1.7 mg/dL — ABNORMAL HIGH (ref <1.0)

## 2019-12-22 LAB — NA AND K (SODIUM & POTASSIUM), RAND UR
Potassium Urine: 84 mmol/L
Sodium, Ur: 112 mmol/L

## 2019-12-22 LAB — CREATININE, URINE, RANDOM: Creatinine, Urine: 179.69 mg/dL

## 2019-12-22 LAB — PROCALCITONIN: Procalcitonin: 0.87 ng/mL

## 2019-12-22 MED ORDER — INSULIN DETEMIR 100 UNIT/ML ~~LOC~~ SOLN
5.0000 [IU] | Freq: Two times a day (BID) | SUBCUTANEOUS | Status: DC
  Administered 2019-12-22 – 2019-12-25 (×7): 5 [IU] via SUBCUTANEOUS
  Filled 2019-12-22 (×8): qty 0.05

## 2019-12-22 MED ORDER — CLOPIDOGREL BISULFATE 75 MG PO TABS
75.0000 mg | ORAL_TABLET | Freq: Every day | ORAL | Status: DC
  Administered 2019-12-22 – 2019-12-23 (×2): 75 mg via ORAL
  Filled 2019-12-22 (×4): qty 1

## 2019-12-22 MED ORDER — SODIUM CHLORIDE 0.9 % IV SOLN: INTRAVENOUS | Status: DC

## 2019-12-22 NOTE — Progress Notes (Signed)
Inpatient Diabetes Program Recommendations  AACE/ADA: New Consensus Statement on Inpatient Glycemic Control (2015)  Target Ranges:  Prepandial:   less than 140 mg/dL      Peak postprandial:   less than 180 mg/dL (1-2 hours)      Critically ill patients:  140 - 180 mg/dL   Lab Results  Component Value Date   GLUCAP 293 (H) 12/22/2019   HGBA1C 9.3 (H) 10/29/2019    Review of Glycemic Control Results for FEMALE, IAFRATE (MRN [id number]) as of 12/22/2019 09:44  Ref. Range 12/20/2019 21:16 12/21/2019 07:50 12/21/2019 11:59 12/21/2019 17:03 12/22/2019 07:47  Glucose-Capillary Latest Ref Range: 70 - 99 mg/dL 967 (H) 893 (H) 810 (H) 330 (H) 293 (H)   Diabetes history: DM2 Outpatient Diabetes medications: NPH Insulin 12 units QHS + Regular 3-4 units TID  Current orders for Inpatient glycemic control: Novolog 3 units tid meal coverage + Novolog moderate correction tid  Inpatient Diabetes Program Recommendations:   -Add Novolog hs correction 0-5 units -Add Levemir 5 units bid  Thank you, Darel Hong E. Yordin Rhoda, RN, MSN, CDE  Diabetes Coordinator Inpatient Glycemic Control Team Team Pager [phone] (8am-5pm) 12/22/2019 9:47 AM

## 2019-12-22 NOTE — Progress Notes (Addendum)
Patient ID: [name], female   DOB: [birth year], 74 y.o.   MRN: [id number]     Advanced Heart Failure Rounding Note  PCP-Cardiologist: Chrystie Nose, MD   Subjective:    Creatinine continues to climb. Has been off diuretics.  Lab Results  Component Value Date   CREATININE 4.25 (H) 12/22/2019   CREATININE 3.56 (H) 12/21/2019   CREATININE 3.11 (H) 12/21/2019     Yesterday eliquis, milrinone, and spiro stopped.  CO-OX 60%.   RHC/LHC today:  Coronary Findings  Diagnostic Dominance: Right Left Main  Short, no severe disease.  Left Anterior Descending  95% mid LAD with competitive flow from LIMA. LIMA not engaged => right-sided aortic arch, unable to cannulate left subclavian (same problem on prior cath). Radial artery not used given very weak/thready pulse and small arm.  Left Circumflex  Diffusely disease proximally up to 95%. SVG-OM1 and SVG-OM2 patent.  Right Coronary Artery  Known occlusion of the SVG-RCA. Long stented segment in proximal to mid RCA. 80% in-stent restenosis in the mid RCA. 99% stenosis mid PLV branch (possible culprit for unstable angina).  Intervention  No interventions have been documented. Right Heart  Right Heart Pressures RHC Procedural Findings: Hemodynamics (mmHg) RA 9 RV 37/10 PA 43/12, mean 25 PCWP mean 19 LV 96/20 AO 97/52  Oxygen saturations: PA 53% AO 98%  Cardiac Output (Fick) 3.05  Cardiac Index (Fick) 1.83  Cardiac Output (thermo) 2.48 Cardiac Index (thermo) 1.49      Objective:   Weight Range: 65.2 kg Body mass index is 26.28 kg/m.   Vital Signs:   Temp:  [97.6 F (36.4 C)-98.1 F (36.7 C)] 98.1 F (36.7 C) (08/16 0754) Pulse Rate:  [85-89] 89 (08/16 0836) Resp:  [18-20] 20 (08/16 0754) BP: (103-116)/(59-75) 116/59 (08/16 0836) SpO2:  [91 %-92 %] 91 % (08/16 0836) Weight:  [65.2 kg] 65.2 kg (08/16 0436) Last BM Date: 12/20/19  Weight change: Filed Weights   12/19/19 0500 12/21/19 0533 12/22/19 0436    Weight: 70.5 kg 64 kg 65.2 kg    Intake/Output:   Intake/Output Summary (Last 24 hours) at 12/22/2019 1137 Last data filed at 12/21/2019 2359 Gross per 24 hour  Intake 1131.38 ml  Output 450 ml  Net 681.38 ml      Physical Exam  Physical Exam per Dr Gala Romney General: No resp difficulty HEENT: normal Neck: supple. no JVD. Carotids 2+ bilat; no bruits. No lymphadenopathy or thryomegaly appreciated. Cor: PMI nondisplaced. Regular rate & rhythm. No rubs, gallops or murmurs. Lungs: clear Abdomen: soft, nontender, nondistended. No hepatosplenomegaly. No bruits or masses. Good bowel sounds. Extremities: no cyanosis, clubbing, rash, edema Neuro: alert & orientedx3, cranial nerves grossly intact. moves all 4 extremities w/o difficulty. Affect pleasant   Telemetry  SR 60-70s    Labs    CBC Recent Labs    12/21/19 0644 12/22/19 0647  WBC 5.1 8.9  NEUTROABS 4.5 8.2*  HGB 10.7* 10.7*  HCT 34.3* 34.7*  MCV 85.5 86.8  PLT 144* 155   Basic Metabolic Panel Recent Labs    35/46/56 1856 12/22/19 0647  NA 130* 132*  K 4.6 4.7  CL 92* 93*  CO2 26 24  GLUCOSE 407* 305*  BUN 63* 72*  CREATININE 3.56* 4.25*  CALCIUM 7.5* 7.7*   Liver Function Tests Recent Labs    12/20/19 0631  AST 41  ALT 19  ALKPHOS 56  BILITOT 0.8  PROT 5.3*  ALBUMIN 2.4*   No results for input(s): LIPASE,  AMYLASE in the last 72 hours. Cardiac Enzymes No results for input(s): CKTOTAL, CKMB, CKMBINDEX, TROPONINI in the last 72 hours.  BNP: BNP (last 3 results) Recent Labs    12/14/19 1355 12/14/19 2302 12/17/19 0255  BNP 4,206.6* 3,317.2* 1,726.9*    ProBNP (last 3 results) No results for input(s): PROBNP in the last 8760 hours.   D-Dimer Recent Labs    12/20/19 0631  DDIMER 0.90*   Hemoglobin A1C No results for input(s): HGBA1C in the last 72 hours. Fasting Lipid Panel No results for input(s): CHOL, HDL, LDLCALC, TRIG, CHOLHDL, LDLDIRECT in the last 72 hours. Thyroid  Function Tests No results for input(s): TSH, T4TOTAL, T3FREE, THYROIDAB in the last 72 hours.  Invalid input(s): FREET3  Other results:   Imaging    No results found.   Medications:     Scheduled Medications: . amiodarone  100 mg Oral Daily  . vitamin C  500 mg Oral Daily  . aspirin  81 mg Oral Daily  . azithromycin  500 mg Oral Daily  . Chlorhexidine Gluconate Cloth  6 each Topical Daily  . cholecalciferol  1,000 Units Oral Daily  . ezetimibe  10 mg Oral QHS  . feeding supplement (ENSURE ENLIVE)  237 mL Oral Daily  . heparin injection (subcutaneous)  5,000 Units Subcutaneous Q8H  . insulin aspart  0-15 Units Subcutaneous TID WC  . insulin aspart  3 Units Subcutaneous TID WC  . insulin detemir  5 Units Subcutaneous BID  . Ipratropium-Albuterol  1 puff Inhalation QID  . levothyroxine  50 mcg Oral Q0600  . methylPREDNISolone (SOLU-MEDROL) injection  40 mg Intravenous BID  . pantoprazole  40 mg Oral Q1200  . simvastatin  40 mg Oral QHS  . sodium chloride flush  10-40 mL Intracatheter Q12H  . venlafaxine XR  150 mg Oral QHS  . vitamin B-12  1,000 mcg Oral Daily  . zinc sulfate  220 mg Oral Daily    Infusions: . cefTRIAXone (ROCEPHIN)  IV 2 g (12/21/19 1747)  . remdesivir 100 mg in NS 100 mL 100 mg (12/21/19 0912)    PRN Medications: acetaminophen, alum & mag hydroxide-simeth, guaiFENesin-dextromethorphan, nitroGLYCERIN, ondansetron (ZOFRAN) IV, sodium chloride flush, traMADol    Assessment/Plan   1. COVID-19 infection: Only got 1 dose of Pfizer vaccine, missed 2nd dose. Suspect main issues here is CHF, not COVID PNA. However, she has had periodic fevers and was started on remdesivir.  CXR with pulmonary edema, possible LLL infiltrate.  - Remdesivir per [facility].  2. Acute on chronic systolic CHF: History of ischemic cardiomyopathy.  MDT ICD.  Echo this admission with fall in EF to 15-20%, global hypokinesis, moderate LV dilation, severely decreased RV function, severe  MR, moderate-severe TR.  RHC with mildly elevated filling pressures, cardiac output low.   -Placed on milrinone 0.25 mcg /kg/min after PCI to optimize cardiac output.  Milrinone was stopped 12/21/19  - Holding  losartan + spironolactone due to AKI. No dig with elevated creatinine.  - Patient has LBBB but probably not wide enough (129 msec) to benefit much from CRT.  - With frailty, think she would not be a good candidate for advanced therapies.  3. CAD: CABG x 4 2008. NSTEMI 05/2017>>LHC w/ occluded SVG-PDA and serial 70% and 90% stenoses in the proximal to mid RCA, treated w/ DES x 2 to RCA.  HS troponin this admit low level and flat, 23>>23>>36. Chest tightness on and off but now resolved, EF is lower on echo this  admission.  Concern for unstable angina/small NSTEMI, coronary angiography today with stable anatomy except for 80% ISR mid RCA, 99% mid PLV stenosis (likely culprit).  - 12/19/19 S/P PCI RCA - Restart plavix today.  -On ASA + Statin.  - eliquis stopped 12/21/19. May restart tomorrow.  4. UTI: Treating with ceftriaxone.  5. Type 2 diabetes: on SSI. No SGLT2 for now with AKI  6. 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  7. Atrial fibrillation: Paroxysmal.   She is on amiodarone.  - Continue amiodarone.  8. AKI  Creatinine rising over the last few days--> 1.6>2>3.1>3.56>4.25 - Will need nephrology consult.  - No indication for HD     Length of Stay: 7  Amy Clegg, NP  12/22/2019, 11:37 AM  Advanced Heart Failure Team Pager [phone] (M-F; 7a - 4p)  Please contact [facility] for night-coverage after hours (4p -7a ) and weekends on [email or web address]  Patient seen and examined with the above-signed Advanced Practice Provider and/or Housestaff. I personally reviewed laboratory data, imaging studies and relevant notes. I independently examined the patient and formulated the important aspects of the plan. I have edited the note to reflect any of my changes or  salient points. I have personally discussed the plan with the patient and/or family.  Renal function continues to worsen. She is more SOB. No obvious uremia. CV 12. Making minimal urine. Renal has seen and recommends holding lasix today. Denies CP or SOB. Plavix stopped briefly but now restarted  General:  Weak appearing. Mildly SOB HEENT: normal Neck: supple. JVP to jaw . Carotids 2+ bilat; no bruits. No lymphadenopathy or thryomegaly appreciated. Cor: PMI nondisplaced. Regular rate & rhythm. No rubs, gallops or murmurs. Lungs: + crackles  Abdomen: soft, nontender, nondistended. No hepatosplenomegaly. No bruits or masses. Good bowel sounds. Extremities: no cyanosis, clubbing, rash, 1+  edema Neuro: alert & orientedx3, cranial nerves grossly intact. moves all 4 extremities w/o difficulty. Affect pleasant  Suspect she has contrast-induced nephropathy. Does not appear low output on exam. Volume up.  Renal now following. They suggest holding diuretics today. I would consider giving her a dose of IV tomorrow. Suspect she will need HD and with her ER may need to move to ICU for CVVHD. Continue Plavix with recent stenting. Can consider empirically restarting inotropes.   Arvilla Meres, MD  11:23 PM

## 2019-12-22 NOTE — Consult Note (Signed)
Nephrology Consult   Requesting provider: Lynden [facility] Service requesting consult: Hospitalist Reason for consult: AKI on CKD   Assessment/Recommendations: [name] is a/an 74 y.o. female with a past medical history systolic heart failure status post AICD, atrial fibrillation, DM 2  who present w/ NSTEMI, Covid, now with AKI on CKD  Oliguric AKI: Rising creatinine since 8/13.  Urine output decreasing.  Likely multifactorial but contrast induced kidney injury is likely the driving issue.  Marked elevated urine osmolality is consistent with contrast-induced injury.  Possible that she had cholesterol emboli associated with vascular procedure.  Also possible that she has some ongoing damage related to cardiorenal syndrome. -Follow-up renal ultrasound -May need to start dialysis soon if her urine output and kidney function do not improve by tomorrow -N.p.o. at midnight for potential catheter placement; will consult IR tomorrow if not improved -Add on C3 to evaluate for cholesterol embolic injuly -Continue to monitor daily Cr, Dose meds for GFR -Monitor Daily I/Os, Daily weight  -Maintain MAP>65 for optimal renal perfusion.  -Avoid nephrotoxic medications including NSAIDs and Vanc/Zosyn combo -No absolute indication for hemodialysis at this time but may consider as above  Volume Status: Appears somewhat volume overloaded at this time but difficult to assess pulmonary status given concomitant Covid.  Holding diuretics for now and continue to monitor urine output and creatinine  HFrEF/CAD: Complicated history with decreased ejection fraction of 15 to 20% and history of CABG as well as PCI this admission.  Holding diuresis as above.  Heart failure team following.  Appreciate help.  Atrial fibrillation: Rate controlled on amiodarone.  Holding oral anticoagulation  Uncontrolled Diabetes Mellitus Type 2 with Hyperglycemia: Exacerbated by steroids.  Defer management to primary team.  Covid:  Partially vaccinated.  Disease seem to be relatively minimal.  Also treating for possible pneumonia.  Antibiotics and treatment of Covid per primary team.   Recommendations conveyed to primary service.    Darnell Level [facility] 12/22/2019 4:11 PM   _____________________________________________________________________________________ CC: AKI on CKD, hyponatremia  History of Present Illness: [name] is a/an 74 y.o. female with a past medical history of systolic heart failure status post AICD, atrial fibrillation, DM 2 who presents with chest pain and Covid positive..  Patient has a history of chronic systolic heart failure that is felt to be ischemic.  Echocardiogram on this admission with an ejection fraction of 15 to 20% with global hypokinesis and severely decreased right ventricular function.  She had a left and right heart catheterization.  Right heart catheterization with elevated filling pressures.  Because of chest pain on admission there was concern for unstable angina and underwent left heart catheterization which demonstrated stable anatomy except for an 80% stenosis with PCI on 8/13.  She has been followed with cardiology who was helping with diuresis given her volume overload.  She was briefly on milrinone to optimize cardiac output but this was stopped on 8/15.  She was also initially on losartan and spironolactone but this was held due to a rising creatinine.  During this hospitalization the patient was also found to be Covid positive.  Her symptoms have been relatively minimal.  She is partially vaccinated.  Her baseline creatinine is around 1.3.  Her creatinine was 1.4 on admission but did go down to 1.1.  Her creatinine is steadily risen since 8/13.  Her creatinine is 4.25 today.  She does have some poor appetite but otherwise feels well.  She continues to have some shortness of  breath and hypoxia intermittently.  She does not wear her oxygen consistently.   She has had marked hyperglycemia likely related to steroids.  She does not feel like she is urinated today.  Renal ultrasound is pending.  She notes that her CKD in the past has been thought to be related to diabetes as well as chronic NSAID use.  She has not used NSAIDs in the past year.  Medications:  Current Facility-Administered Medications  Medication Dose Route Frequency Provider Last Rate Last Admin  . acetaminophen (TYLENOL) tablet 650 mg  650 mg Oral Q6H PRN Marykay LexHarding, David W, MD   650 mg at 12/18/19 2210  . alum & mag hydroxide-simeth (MAALOX/MYLANTA) 200-200-20 MG/5ML suspension 30 mL  30 mL Oral Q6H PRN Rolly SalterPatel, Pranav M, MD   30 mL at 12/20/19 1906  . amiodarone (PACERONE) tablet 100 mg  100 mg Oral Daily Marykay LexHarding, David W, MD   100 mg at 12/22/19 0906  . ascorbic acid (VITAMIN C) tablet 500 mg  500 mg Oral Daily Marykay LexHarding, David W, MD   500 mg at 12/22/19 [phone]  . aspirin chewable tablet 81 mg  81 mg Oral Daily Marykay LexHarding, David W, MD   81 mg at 12/22/19 [phone]  . azithromycin (ZITHROMAX) tablet 500 mg  500 mg Oral Daily Rolly SalterPatel, Pranav M, MD   500 mg at 12/22/19 0906  . cefTRIAXone (ROCEPHIN) 2 g in sodium chloride 0.9 % 100 mL IVPB  2 g Intravenous Q24H Rolly SalterPatel, Pranav M, MD 200 mL/hr at 12/21/19 1747 2 g at 12/21/19 1747  . Chlorhexidine Gluconate Cloth 2 % PADS 6 each  6 each Topical Daily Rolly SalterPatel, Pranav M, MD   6 each at 12/21/19 1500  . cholecalciferol (VITAMIN D3) tablet 1,000 Units  1,000 Units Oral Daily Marykay LexHarding, David W, MD   1,000 Units at 12/22/19 [phone]  . clopidogrel (PLAVIX) tablet 75 mg  75 mg Oral Q breakfast Bensimhon, Bevelyn Bucklesaniel R, MD   75 mg at 12/22/19 1431  . ezetimibe (ZETIA) tablet 10 mg  10 mg Oral QHS Marykay LexHarding, David W, MD   10 mg at 12/21/19 2146  . feeding supplement (ENSURE ENLIVE) (ENSURE ENLIVE) liquid 237 mL  237 mL Oral Daily Marykay LexHarding, David W, MD   237 mL at 12/22/19 1152  . guaiFENesin-dextromethorphan (ROBITUSSIN DM) 100-10 MG/5ML syrup 10 mL  10 mL Oral Q4H PRN Marykay LexHarding,  David W, MD      . heparin injection 5,000 Units  5,000 Units Subcutaneous Q8H Rolly SalterPatel, Pranav M, MD   5,000 Units at 12/22/19 1430  . insulin aspart (novoLOG) injection 0-15 Units  0-15 Units Subcutaneous TID WC Marykay LexHarding, David W, MD   8 Units at 12/22/19 1150  . insulin aspart (novoLOG) injection 3 Units  3 Units Subcutaneous TID WC Rolly SalterPatel, Pranav M, MD   3 Units at 12/22/19 1151  . insulin detemir (LEVEMIR) injection 5 Units  5 Units Subcutaneous BID Rolly SalterPatel, Pranav M, MD   5 Units at 12/22/19 1431  . Ipratropium-Albuterol (COMBIVENT) respimat 1 puff  1 puff Inhalation QID Rolly SalterPatel, Pranav M, MD   1 puff at 12/22/19 1606  . levothyroxine (SYNTHROID) tablet 50 mcg  50 mcg Oral Q0600 Marykay LexHarding, David W, MD   50 mcg at 12/22/19 [phone]  . methylPREDNISolone sodium succinate (SOLU-MEDROL) 40 mg/mL injection 40 mg  40 mg Intravenous BID Rolly SalterPatel, Pranav M, MD   40 mg at 12/22/19 0906  . nitroGLYCERIN (NITROSTAT) SL tablet 0.4 mg  0.4 mg Sublingual Q5 min PRN  Marykay Lex, MD      . ondansetron [facility]) injection 4 mg  4 mg Intravenous Q6H PRN Marykay Lex, MD      . pantoprazole (PROTONIX) EC tablet 40 mg  40 mg Oral Q1200 Marykay Lex, MD   40 mg at 12/22/19 1150  . simvastatin (ZOCOR) tablet 40 mg  40 mg Oral QHS Marykay Lex, MD   40 mg at 12/21/19 2147  . sodium chloride flush (NS) 0.9 % injection 10-40 mL  10-40 mL Intracatheter Q12H Rolly Salter, MD   10 mL at 12/22/19 1202  . sodium chloride flush (NS) 0.9 % injection 10-40 mL  10-40 mL Intracatheter PRN Rolly Salter, MD   10 mL at 12/22/19 1201  . traMADol (ULTRAM) tablet 50 mg  50 mg Oral Q6H PRN Marykay Lex, MD   50 mg at 12/21/19 0048  . venlafaxine XR (EFFEXOR-XR) 24 hr capsule 150 mg  150 mg Oral QHS Marykay Lex, MD   150 mg at 12/21/19 2148  . vitamin B-12 (CYANOCOBALAMIN) tablet 1,000 mcg  1,000 mcg Oral Daily Marykay Lex, MD   1,000 mcg at 12/22/19 4967  . zinc sulfate capsule 220 mg  220 mg Oral Daily Marykay Lex, MD   220 mg at 12/22/19 5916     ALLERGIES Crestor [rosuvastatin] and Lipitor [atorvastatin]  MEDICAL HISTORY Past Medical History:  Diagnosis Date  . Cholelithiases   . Coronary artery disease 02/2007   CABG x4  . Diabetes (HCC)   . Dyslipidemia   . HTN (hypertension)   . Hypothyroid   . PAF (paroxysmal atrial fibrillation) (HCC)    post op     SOCIAL HISTORY Social History   Socioeconomic History  . Marital status: Married    Spouse name: Not on file  . Number of children: 1  . Years of education: 21  . Highest education level: Not on file  Occupational History  . Occupation: retired    Associate Professor: [employer]  Tobacco Use  . Smoking status: Never Smoker  . Smokeless tobacco: Never Used  Vaping Use  . Vaping Use: Never used  Substance and Sexual Activity  . Alcohol use: No  . Drug use: No  . Sexual activity: Never    Birth control/protection: Abstinence  Other Topics Concern  . Not on file  Social History Narrative  . Not on file   Social Determinants of Health   Financial Resource Strain:   . Difficulty of Paying Living Expenses:   Food Insecurity:   . Worried About Programme researcher, broadcasting/film/video in the Last Year:   . Barista in the Last Year:   Transportation Needs:   . Freight forwarder (Medical):   Marland Kitchen Lack of Transportation (Non-Medical):   Physical Activity:   . Days of Exercise per Week:   . Minutes of Exercise per Session:   Stress:   . Feeling of Stress :   Social Connections:   . Frequency of Communication with Friends and Family:   . Frequency of Social Gatherings with Friends and Family:   . Attends Religious Services:   . Active Member of Clubs or Organizations:   . Attends Banker Meetings:   Marland Kitchen Marital Status:   Intimate Partner Violence:   . Fear of Current or Ex-Partner:   . Emotionally Abused:   Marland Kitchen Physically Abused:   . Sexually Abused:      FAMILY HISTORY  Family History  Problem Relation Age of  Onset  . Diabetes Mother 13  . Heart disease Mother   . Hyperlipidemia Mother   . Cancer Father   . Heart attack Father 87  . Cancer Maternal Grandmother   . Cancer Paternal Grandmother   . Thyroid disease Sister   . Diabetes Child       Review of Systems: 12 systems reviewed Otherwise as per HPI, all other systems reviewed and negative  Physical Exam: Vitals:   12/22/19 0836 12/22/19 1202  BP: (!) 116/59 115/63  Pulse: 89   Resp:  18  Temp:  98.2 F (36.8 C)  SpO2: 91%    Total I/O In: 30 [I.V.:30] Out: 25 [Urine:25]  Intake/Output Summary (Last 24 hours) at 12/22/2019 1611 Last data filed at 12/22/2019 1202 Gross per 24 hour  Intake 924.38 ml  Output 475 ml  Net 449.38 ml   General: Chronically ill-appearing, lying in bed, no distress HEENT: anicteric sclera, oropharynx clear without lesions CV: Normal rate, no audible murmur, bilateral lower extremity edema Lungs: Bilateral crackles, no increased work of breathing, nasal cannula on the forehead Abd: soft, nondistended, bowel sounds present Skin: no visible lesions or rashes Psych: alert, engaged, appropriate mood and affect Musculoskeletal: no obvious deformities Neuro: normal speech, no gross focal deficits   Test Results Reviewed Lab Results  Component Value Date   NA 132 (L) 12/22/2019   K 4.7 12/22/2019   CL 93 (L) 12/22/2019   CO2 24 12/22/2019   BUN 72 (H) 12/22/2019   CREATININE 4.25 (H) 12/22/2019   CALCIUM 7.7 (L) 12/22/2019   ALBUMIN 2.4 (L) 12/20/2019   PHOS 3.0 05/18/2017     I have reviewed all relevant outside healthcare records related to the patient's kidney injury.

## 2019-12-22 NOTE — Progress Notes (Addendum)
[facility] Progress Note  Patient: [name]    RCV:[id number]  DOA: 12/14/2019     Date of Service: the patient was seen and examined on 12/22/2019  Brief hospital course: Past medical history of chronic systolic CHF, SP AICD implant, PAF on Eliquis, type II DM, partially vaccinated.  Presents with shortness of breath and swelling in the leg with chest pain found to have acute decompensated systolic CHF.  Tested positive for Covid.  Started having fever on 8/11//2021.  Currently plan is continue treating Covid and further work-up for CHF per cardiology and monitor renal function  Assessment and Plan: 1.  Acute hypoxic respiratory failure CAD SP DES of proximal RPL 2 PTCA of the mid RCA on 12/19/2019 History of CABG 2008 Acute on chronic combined systolic and diastolic CHF Moderate bilateral pleural effusion Med[employer] ICD Chest pain/angina Found to have significant volume overload consistent with decompensated systolic CHF. Cardiology was consulted. Patient was started on aggressive IV diuresis. Underwent left heart cath with DES placement on 12/19/2019 as well as balloon angioplasty. Patient on Primacor after right heart cath.  Currently on hold PICC line ordered by cardiology. Patient was on IV heparin for ACS protocol which is currently discontinued due to hematuria.  2.  COVID-19 pneumonia and Bacterial community-acquired pneumonia Lab Results  Component Value Date   SARSCOV2NAA POSITIVE (A) 12/14/2019   SARSCOV2NAA NEGATIVE 10/29/2019   CXR: hazy bilateral peripheral opacities  Recent Labs    12/20/19 0631 12/21/19 0644 12/22/19 0647  DDIMER 0.90*  --   --   FERRITIN 288  --   --   CRP 4.2* 2.5* 1.7*   Tmax last 24 hours:  Temp (24hrs), Avg:98 F (36.7 C), Min:97.6 F (36.4 C), Max:98.2 F (36.8 C)  Oxygen requirements: Currently on 4 LPM Diuretics: Intermittent diuresis Vitamin C and Zinc: Continue DVT Prophylaxis: heparin injection 5,000 Units Start:  12/21/19 2200 Remdesivir: Started on 12/17/2019 Antibiotic: started for total of 5 days on 12/20/2019. Prone positioning: Patient encouraged to stay in prone position as much as possible.  3.  Hematuria In the setting of being on IV heparin. CT renal protocol negative for any acute abnormality.  H&H stable.  4.  Acute metabolic encephalopathy from hypoglycemia From poor p.o. intake due to n.p.o. status. Currently resolved.  5.  Type 2 diabetes mellitus with uncontrolled hyper and hypoglycemia and renal complication Blood sugar fluctuating significantly. Continue sliding scale insulin.  Monitor.  6.  Paroxysmal A. Fib Essential hypertension Currently rate controlled. Continue amiodarone and Coreg. Initially anticoagulated with Eliquis currently on due to hematuria. Continue losartan and Coreg.  7.  Acute kidney injury on CKD stage IIIa. Likely contrast induced nephropathy possibly cholesterol emboli. Serum creatinine significantly elevated. Cardiology currently holding diuresis. Monitor response. Will support with albumin. Also contrast-induced nephropathy cannot be ruled out. Renal function does not improve tomorrow will initiate further work-up. Nephrology consulted. N.p.o. after midnight for potential catheter placement tomorrow.  8.  UTI. Treated with Rocephin. Currently no symptoms.  9.  Hypothyroidism. TSH stable. Continue Synthroid.  10.  Mild hyperkalemia. Repeat potassium stable. Continue to monitor.  11. Leukopenia Acute thrombocytopenia In the setting of COVID-19.  Monitor.  Diet: Cardiac diet DVT Prophylaxis:  heparin injection 5,000 Units Start: 12/21/19 2200  Advance goals of care discussion: Full code  Family Communication: no family was present at bedside, at the time of interview.  Discussed with daughter on the phone. The pt provided permission to discuss medical plan with the family.  Opportunity was given to ask question and all questions were  answered satisfactorily.   Disposition:  Status is: Inpatient  Remains inpatient appropriate because:Ongoing diagnostic testing needed not appropriate for outpatient work up and IV treatments appropriate due to intensity of illness or inability to take PO   Dispo: The patient is from: Home              Anticipated d/c is to: SNF              Anticipated d/c date is: 2 days              Patient currently is not medically stable to d/c.  Subjective: Continues to have shortness of breath and cough.  No more fever.  No further confusion.  No nausea no vomiting.  No fever no chills.  Physical Exam: General: Appear in moderate distress, no Rash; Oral Mucosa Clear, moist. no Abnormal Neck Mass Or lumps, Conjunctiva normal  Cardiovascular: S1 and S2 Present, no Murmur, Respiratory: increased respiratory effort, Bilateral Air entry present and bilateral  Crackles, no wheezes Abdomen: Bowel Sound present, Soft and no tenderness Extremities: bilateral  Pedal edema, no calf tenderness Neurology: alert and oriented to time, place, and person affect appropriate. no new focal deficit Gait not checked due to patient safety concerns  Vitals:   12/22/19 0436 12/22/19 0754 12/22/19 0836 12/22/19 1202  BP: 113/62 116/60 (!) 116/59 115/63  Pulse: 85 88 89   Resp: 18 20  18   Temp: 97.6 F (36.4 C) 98.1 F (36.7 C)  98.2 F (36.8 C)  TempSrc: Oral Oral  Oral  SpO2: 92% 91% 91%   Weight: 65.2 kg     Height:        Intake/Output Summary (Last 24 hours) at 12/22/2019 1847 Last data filed at 12/22/2019 1202 Gross per 24 hour  Intake 684.38 ml  Output 475 ml  Net 209.38 ml   Filed Weights   12/19/19 0500 12/21/19 0533 12/22/19 0436  Weight: 70.5 kg 64 kg 65.2 kg    Data Reviewed: I have personally reviewed and interpreted daily labs, tele strips, imagings as discussed above. I reviewed all nursing notes, pharmacy notes, vitals, pertinent old records I have discussed plan of care as described  above with RN and patient/family.  CBC: Recent Labs  Lab 12/18/19 0346 12/18/19 1829 12/19/19 0348 12/19/19 1511 12/19/19 1512 12/19/19 2120 12/20/19 0631 12/21/19 0644 12/22/19 0647  WBC 4.3   < > 3.3*  --   --  1.6* 2.3* 5.1 8.9  NEUTROABS 2.5  --  2.0  --   --   --  1.8 4.5 8.2*  HGB 12.9   < > 11.9*   < > 12.6 11.5* 11.2* 10.7* 10.7*  HCT 42.3   < > 39.1   < > 37.0 38.2 36.8 34.3* 34.7*  MCV 90.8   < > 89.7  --   --  89.9 89.1 85.5 86.8  PLT 155   < > 128*  --   --  126* 112* 144* 155   < > = values in this interval not displayed.   Basic Metabolic Panel: Recent Labs  Lab 12/17/19 0312 12/18/19 0346 12/20/19 0631 12/20/19 1356 12/21/19 0935 12/21/19 1856 12/22/19 0647  NA 134*   < > 132* 134* 131* 130* 132*  K 3.9   < > 3.9 3.7 4.5 4.6 4.7  CL 95*   < > 95* 95* 94* 92* 93*  CO2 28   < >  27 27 23 26 24   GLUCOSE 92   < > 196* 231* 299* 407* 305*  BUN 21   < > 34* 39* 59* 63* 72*  CREATININE 1.31*   < > 1.65* 2.01* 3.11* 3.56* 4.25*  CALCIUM 8.0*   < > 7.5* 7.7* 7.3* 7.5* 7.7*  MG 1.7  --   --   --   --   --   --    < > = values in this interval not displayed.    Studies: RENAL  Result Date: 12/22/2019 CLINICAL DATA:  Acute kidney injury. EXAM: RENAL / URINARY TRACT ULTRASOUND COMPLETE COMPARISON:  CT abdomen and pelvis 12/18/2019 FINDINGS: Right Kidney: Renal measurements: 9.6 x 4.7 x 5.7 cm = volume: 126 mL. Cortical thinning and borderline increased parenchymal echogenicity. No mass or hydronephrosis. Renal hilar echogenicity likely corresponds to vascular calcification on the recent CT. Left Kidney: Renal measurements: 10.0 x 5.3 x 5.4 cm = volume: 147 mL. Borderline cortical thinning. Echogenicity within normal limits. No mass or hydronephrosis visualized. Bladder: Appears normal for degree of bladder distention. Other: None. IMPRESSION: 1. No hydronephrosis. 2. Asymmetric right renal cortical thinning. Electronically Signed   By: 02/17/2020 M.D.   On:  12/22/2019 16:22    Scheduled Meds: . amiodarone  100 mg Oral Daily  . vitamin C  500 mg Oral Daily  . aspirin  81 mg Oral Daily  . azithromycin  500 mg Oral Daily  . Chlorhexidine Gluconate Cloth  6 each Topical Daily  . cholecalciferol  1,000 Units Oral Daily  . clopidogrel  75 mg Oral Q breakfast  . ezetimibe  10 mg Oral QHS  . feeding supplement (ENSURE ENLIVE)  237 mL Oral Daily  . heparin injection (subcutaneous)  5,000 Units Subcutaneous Q8H  . insulin aspart  0-15 Units Subcutaneous TID WC  . insulin aspart  3 Units Subcutaneous TID WC  . insulin detemir  5 Units Subcutaneous BID  . Ipratropium-Albuterol  1 puff Inhalation QID  . levothyroxine  50 mcg Oral Q0600  . methylPREDNISolone (SOLU-MEDROL) injection  40 mg Intravenous BID  . pantoprazole  40 mg Oral Q1200  . simvastatin  40 mg Oral QHS  . sodium chloride flush  10-40 mL Intracatheter Q12H  . venlafaxine XR  150 mg Oral QHS  . vitamin B-12  1,000 mcg Oral Daily  . zinc sulfate  220 mg Oral Daily   Continuous Infusions: . cefTRIAXone (ROCEPHIN)  IV 2 g (12/22/19 1721)   PRN Meds: acetaminophen, alum & mag hydroxide-simeth, guaiFENesin-dextromethorphan, nitroGLYCERIN, ondansetron (ZOFRAN) IV, sodium chloride flush, traMADol  Time spent: 35 minutes  Author: 12/24/19, MD [facility] 12/22/2019 6:47 PM  To reach On-call, see care teams to locate the attending and reach out via [email or web address]. Between 7PM-7AM, please contact night-coverage If you still have difficulty reaching the attending provider, please page the [facility] (Director on Call) for [facility] on amion for assistance.   The treatment plan and use of medications and known side effects were discussed with patient/family. It was clearly explained that there is no proven definitive treatment for COVID-19 infection yet. Any medications used here are based on case reports/anecdotal data which are not peer-reviewed and has not been studied using  randomized control trials.  Complete risks and long-term side effects are unknown, however in the best clinical judgment they seem to be of some clinical benefit rather than medical risks.  Patient/family agree with the treatment plan and want to receive these  treatments as indicated.

## 2019-12-23 ENCOUNTER — Inpatient Hospital Stay (HOSPITAL_COMMUNITY): Payer: Medicare Other

## 2019-12-23 DIAGNOSIS — Z515 Encounter for palliative care: Secondary | ICD-10-CM | POA: Diagnosis not present

## 2019-12-23 DIAGNOSIS — I509 Heart failure, unspecified: Secondary | ICD-10-CM | POA: Diagnosis not present

## 2019-12-23 DIAGNOSIS — N179 Acute kidney failure, unspecified: Secondary | ICD-10-CM | POA: Diagnosis not present

## 2019-12-23 DIAGNOSIS — I5023 Acute on chronic systolic (congestive) heart failure: Secondary | ICD-10-CM | POA: Diagnosis not present

## 2019-12-23 DIAGNOSIS — I5022 Chronic systolic (congestive) heart failure: Secondary | ICD-10-CM

## 2019-12-23 DIAGNOSIS — Z7189 Other specified counseling: Secondary | ICD-10-CM | POA: Diagnosis not present

## 2019-12-23 DIAGNOSIS — J9601 Acute respiratory failure with hypoxia: Secondary | ICD-10-CM

## 2019-12-23 HISTORY — PX: IR US GUIDE VASC ACCESS RIGHT: IMG2390

## 2019-12-23 HISTORY — PX: IR FLUORO GUIDE CV LINE RIGHT: IMG2283

## 2019-12-23 LAB — CBC WITH DIFFERENTIAL/PLATELET
Abs Immature Granulocytes: 0.11 10*3/uL — ABNORMAL HIGH (ref 0.00–0.07)
Basophils Absolute: 0 10*3/uL (ref 0.0–0.1)
Basophils Relative: 0 %
Eosinophils Absolute: 0 10*3/uL (ref 0.0–0.5)
Eosinophils Relative: 0 %
HCT: 34.9 % — ABNORMAL LOW (ref 36.0–46.0)
Hemoglobin: 11 g/dL — ABNORMAL LOW (ref 12.0–15.0)
Immature Granulocytes: 1 %
Lymphocytes Relative: 2 %
Lymphs Abs: 0.2 10*3/uL — ABNORMAL LOW (ref 0.7–4.0)
MCH: 26.9 pg (ref 26.0–34.0)
MCHC: 31.5 g/dL (ref 30.0–36.0)
MCV: 85.3 fL (ref 80.0–100.0)
Monocytes Absolute: 0.5 10*3/uL (ref 0.1–1.0)
Monocytes Relative: 5 %
Neutro Abs: 9.5 10*3/uL — ABNORMAL HIGH (ref 1.7–7.7)
Neutrophils Relative %: 92 %
Platelets: 150 10*3/uL (ref 150–400)
RBC: 4.09 MIL/uL (ref 3.87–5.11)
RDW: 15.9 % — ABNORMAL HIGH (ref 11.5–15.5)
WBC: 10.3 10*3/uL (ref 4.0–10.5)
nRBC: 0 % (ref 0.0–0.2)

## 2019-12-23 LAB — BASIC METABOLIC PANEL
Anion gap: 16 — ABNORMAL HIGH (ref 5–15)
BUN: 84 mg/dL — ABNORMAL HIGH (ref 8–23)
CO2: 23 mmol/L (ref 22–32)
Calcium: 7.9 mg/dL — ABNORMAL LOW (ref 8.9–10.3)
Chloride: 94 mmol/L — ABNORMAL LOW (ref 98–111)
Creatinine, Ser: 4.68 mg/dL — ABNORMAL HIGH (ref 0.44–1.00)
GFR calc Af Amer: 10 mL/min — ABNORMAL LOW (ref >60)
GFR calc non Af Amer: 9 mL/min — ABNORMAL LOW (ref >60)
Glucose, Bld: 131 mg/dL — ABNORMAL HIGH (ref 70–99)
Potassium: 4.5 mmol/L (ref 3.5–5.1)
Sodium: 133 mmol/L — ABNORMAL LOW (ref 135–145)

## 2019-12-23 LAB — COOXEMETRY PANEL
Carboxyhemoglobin: 1.3 % (ref 0.5–1.5)
Methemoglobin: 1 % (ref 0.0–1.5)
O2 Saturation: 53.2 %
Total hemoglobin: 11.8 g/dL — ABNORMAL LOW (ref 12.0–16.0)

## 2019-12-23 LAB — GLUCOSE, CAPILLARY
Glucose-Capillary: 161 mg/dL — ABNORMAL HIGH (ref 70–99)
Glucose-Capillary: 114 mg/dL — ABNORMAL HIGH (ref 70–99)
Glucose-Capillary: 135 mg/dL — ABNORMAL HIGH (ref 70–99)

## 2019-12-23 LAB — C3 COMPLEMENT: C3 Complement: 65 mg/dL — ABNORMAL LOW (ref 82–167)

## 2019-12-23 LAB — HEPATITIS B SURFACE ANTIGEN: Hepatitis B Surface Ag: NONREACTIVE

## 2019-12-23 LAB — HEPATITIS B CORE ANTIBODY, IGM: Hep B C IgM: NONREACTIVE

## 2019-12-23 LAB — C-REACTIVE PROTEIN: CRP: 2.6 mg/dL — ABNORMAL HIGH (ref <1.0)

## 2019-12-23 MED ORDER — LIDOCAINE HCL 1 % IJ SOLN
INTRAMUSCULAR | Status: AC
  Filled 2019-12-23: qty 20

## 2019-12-23 MED ORDER — HEPARIN SODIUM (PORCINE) 1000 UNIT/ML IJ SOLN
INTRAMUSCULAR | Status: AC
  Administered 2019-12-24: 1000 [IU]
  Filled 2019-12-23: qty 1

## 2019-12-23 MED ORDER — LIDOCAINE HCL (PF) 1 % IJ SOLN
INTRAMUSCULAR | Status: AC | PRN
  Administered 2019-12-23: 10 mL

## 2019-12-23 MED ORDER — AZITHROMYCIN 250 MG PO TABS
500.0000 mg | ORAL_TABLET | Freq: Every day | ORAL | Status: DC
  Administered 2019-12-23: 500 mg via ORAL
  Filled 2019-12-23 (×2): qty 2

## 2019-12-23 MED ORDER — SODIUM CHLORIDE 0.9 % IV SOLN
2.0000 g | INTRAVENOUS | Status: DC
  Administered 2019-12-23 – 2019-12-24 (×2): 2 g via INTRAVENOUS
  Filled 2019-12-23: qty 20
  Filled 2019-12-23 (×2): qty 2

## 2019-12-23 MED ORDER — CHLORHEXIDINE GLUCONATE CLOTH 2 % EX PADS
6.0000 | MEDICATED_PAD | Freq: Every day | CUTANEOUS | Status: DC
  Administered 2019-12-25: 6 via TOPICAL

## 2019-12-23 MED ORDER — LIDOCAINE HCL (PF) 1 % IJ SOLN
INTRAMUSCULAR | Status: AC | PRN
  Administered 2019-12-23: 5 mL

## 2019-12-23 NOTE — Consult Note (Addendum)
NAME:  [name], MRN:  [id number], DOB:  [birth year], LOS: 8 ADMISSION DATE:  12/14/2019, CONSULTATION DATE:  tx to icu for HD cath and HD 8/17 REFERRING MD:  Renal, CHIEF COMPLAINT:  sob   Brief History   74 year old female extensive past medical history status post cardiac catheterization dye load for drug-eluting stent now with acute renal failure oliguria needing hemodialysis catheter.  History of present illness   74 year old with extensive past medical history who on 12/19/2019 had a cardiac cath later and will require dye load with placement of drug-eluting stent and subsequently developed acute renal failure.  She is now become oliguric with increasing congestive heart failure.  Pulmonary critical care was called to bedside evaluate for possible hemodialysis catheter insertion but due to her shortness of breath with any activity, shortness of breath with lying flat and extensive past medical history was felt prudent to move to the intensive care unit prior to attempting to place HD catheter as she is 50-50 chance of needing intubation.  Her past medical history is been well documented.  She is also COVID-19 positive although she is on minimal oxygen requirements at this time congestive heart failure is her main issue.  Past Medical History   Past Medical History:  Diagnosis Date  . Cholelithiases   . Coronary artery disease 02/2007   CABG x4  . Diabetes (HCC)   . Dyslipidemia   . HTN (hypertension)   . Hypothyroid   . PAF (paroxysmal atrial fibrillation) (HCC)    post op     Significant Hospital Events   12/19/2019 cardiac cath 12/22/2019 transfer to intensive care unit for hemodialysis catheter placement  Consults:  Nephrology Cardiology Pulmonary critical care  Procedures:  12/19/2019 cardiac cath  Significant Diagnostic Tests:  12/19/2019 cardiac cath Successful DES PCI of 90% proximal. Micro Data:    Antimicrobials:    Interim history/subjective:    Increasing shortness of breath in the setting of congestive heart failure volume overload exacerbated by renal failure  Objective   Blood pressure 127/78, pulse 97, temperature 97.9 F (36.6 C), temperature source Oral, resp. rate (!) 25, height 5\' 2"  (1.575 m), weight 64.7 kg, SpO2 94 %. CVP:  [11 mmHg-17 mmHg] 14 mmHg      Intake/Output Summary (Last 24 hours) at 12/26/2019 1035 Last data filed at 12/30/2019 0529 Gross per 24 hour  Intake 450 ml  Output 25 ml  Net 425 ml   Filed Weights   12/21/19 0533 12/22/19 0436 12/29/2019 0837  Weight: 64 kg 65.2 kg 64.7 kg    Examination: General: Elderly female who is obviously short of breath at rest is poorly responsive at time. HENT: Mild JVD is appreciated Lungs: Coarse rhonchi diminished throughout Cardiovascular: Heart sounds are distant Abdomen: Soft nontender Extremities: Lower extremities are trach wrapped Neuro: AA effect fall asleep while having conversations GU: oliguric  Resolved Hospital Problem list     Assessment & Plan:  Acute renal failure following cardiac catheterization presumed secondary to dilated.12/25/19  Dyspneic at rest.  Multiple comorbidities make it more reasonable to move her to intensive care unit before placing HD catheter.  There is a good chance to require intubation. Lab Results  Component Value Date   CREATININE 4.68 (H) 12/20/2019   CREATININE 4.55 (H) 12/22/2019   CREATININE 4.25 (H) 12/22/2019   CREATININE 1.25 (H) 12/17/2012   Recent Labs  Lab 12/22/19 0647 12/22/19 1814 12/26/2019 0542  K 4.7 4.7 4.5    Transfer to intensive  care unit since she is Covid positive 97M may be the appropriate place Place HD catheter once stabilized May need BiPAP during procedure May need intubation  Status post cardiac catheterization with drug-eluting stent placement Per cardiology  COVID-19 Continue pharmaceutical interventions FiO2 as needed Serial chest x-rays Hopefully with fluid removal per  hemodialysis breathing will improve..     Best practice:  Diet: *npo Pain/Anxiety/Delirium protocol (if indicated): ni VAP protocol (if indicated): ni DVT prophylaxis: hep GI prophylaxis: ppi Glucose control: ssi Mobility: oob Code Status: full Family Communication: Called daughter Kateri [phone] concerning move to ICU and permission for HD cath. Permission given. She is tearful as her Dad is on Hospice on MSO4 drip.  Disposition: to icu  Labs   CBC: Recent Labs  Lab 12/19/19 0348 12/19/19 1511 12/19/19 2120 12/20/19 0631 12/21/19 0644 12/22/19 0647 12/15/2019 0542  WBC 3.3*  --  1.6* 2.3* 5.1 8.9 10.3  NEUTROABS 2.0  --   --  1.8 4.5 8.2* 9.5*  HGB 11.9*   < > 11.5* 11.2* 10.7* 10.7* 11.0*  HCT 39.1   < > 38.2 36.8 34.3* 34.7* 34.9*  MCV 89.7  --  89.9 89.1 85.5 86.8 85.3  PLT 128*  --  126* 112* 144* 155 150   < > = values in this interval not displayed.    Basic Metabolic Panel: Recent Labs  Lab 12/17/19 0312 12/18/19 0346 12/21/19 0935 12/21/19 1856 12/22/19 0647 12/22/19 1814 12/10/2019 0542  NA 134*   < > 131* 130* 132* 134* 133*  K 3.9   < > 4.5 4.6 4.7 4.7 4.5  CL 95*   < > 94* 92* 93* 94* 94*  CO2 28   < > 23 26 24 26 23   GLUCOSE 92   < > 299* 407* 305* 167* 131*  BUN 21   < > 59* 63* 72* 78* 84*  CREATININE 1.31*   < > 3.11* 3.56* 4.25* 4.55* 4.68*  CALCIUM 8.0*   < > 7.3* 7.5* 7.7* 8.0* 7.9*  MG 1.7  --   --   --   --   --   --    < > = values in this interval not displayed.   GFR: Estimated Creatinine Clearance: 9.2 mL/min (A) (by C-G formula based on SCr of 4.68 mg/dL (H)). Recent Labs  Lab 12/17/19 0312 12/18/19 0346 12/20/19 0631 12/20/19 1356 12/21/19 0644 12/22/19 0647 12/16/2019 0542  PROCALCITON 0.18  --   --  0.67 0.85 0.87  --   WBC 4.2   < > 2.3*  --  5.1 8.9 10.3  LATICACIDVEN 1.2  --   --   --   --   --   --    < > = values in this interval not displayed.    Liver Function Tests: Recent Labs  Lab 12/17/19 0312  12/18/19 0346 12/19/19 0348 12/20/19 0631  AST 31 40 43* 41  ALT 16 16 17 19   ALKPHOS 61 58 66 56  BILITOT 0.4 0.8 0.8 0.8  PROT 5.9* 6.1* 6.2* 5.3*  ALBUMIN 2.8* 2.9* 2.8* 2.4*   No results for input(s): LIPASE, AMYLASE in the last 168 hours. No results for input(s): AMMONIA in the last 168 hours.  ABG    Component Value Date/Time   PHART 7.514 (H) 12/15/2019 0416   PCO2ART 34.5 12/15/2019 0416   PO2ART 105 12/15/2019 0416   HCO3 31.3 (H) 12/19/2019 1512   TCO2 33 (H) 12/19/2019 1512   ACIDBASEDEF  2.0 02/13/2017 2359   O2SAT 53.2 12/11/2019 0542     Coagulation Profile: No results for input(s): INR, PROTIME in the last 168 hours.  Cardiac Enzymes: No results for input(s): CKTOTAL, CKMB, CKMBINDEX, TROPONINI in the last 168 hours.  HbA1C: Hgb A1c MFr Bld  Date/Time Value Ref Range Status  10/29/2019 03:08 PM 9.3 (H) 4.8 - 5.6 % Final    Comment:    (NOTE) Pre diabetes:          5.7%-6.4%  Diabetes:              >6.4%  Glycemic control for   <7.0% adults with diabetes   05/11/2017 04:22 AM 8.8 (H) 4.8 - 5.6 % Final    Comment:    (NOTE) Pre diabetes:          5.7%-6.4% Diabetes:              >6.4% Glycemic control for   <7.0% adults with diabetes     CBG: Recent Labs  Lab 12/21/19 1703 12/22/19 0747 12/22/19 1147 12/22/19 1619 12/22/19 2103  GLUCAP 330* 293* 289* 147* 133*    Review of Systems:   10 point review of system taken, please see HPI for positives and negatives.   Past Medical History  She,  has a past medical history of Cholelithiases, Coronary artery disease (02/2007), Diabetes (HCC), Dyslipidemia, HTN (hypertension), Hypothyroid, and PAF (paroxysmal atrial fibrillation) (HCC).   Surgical History    Past Surgical History:  Procedure Laterality Date  . ABDOMINAL HYSTERECTOMY    . CARDIAC CATHETERIZATION  03/01/2007   LAD with 99% prox lesion, at birfucation of Cfx there is 99% lesion, at bifurcation of large OM1 there is 99%  lesion, 70% prox lesion at ramus intermedius, 90% lesion in prox RCA (Dr. Claudia DesanctisH. Solomon) - susequent CABG  . CORONARY ARTERY BYPASS GRAFT  03/05/2007   LIMA-LAD, SVG-ramus intermediate. SVG-OM, SVG-PDA (Dr. Donata ClayVan Trigt)  . CORONARY ATHERECTOMY N/A 05/17/2017   Procedure: CORONARY ATHERECTOMY;  Surgeon: Lennette BihariKelly, Thomas A, MD;  [facility];  Service: Cardiovascular;  Laterality: N/A;  . CORONARY STENT INTERVENTION N/A 05/17/2017   Procedure: CORONARY STENT INTERVENTION;  Surgeon: Lennette BihariKelly, Thomas A, MD;  Location: [facility]AS[facility] CV LAB;  Service: Cardiovascular;  Laterality: N/A;  . CORONARY STENT INTERVENTION N/A 12/19/2019   Procedure: CORONARY STENT INTERVENTION;  Surgeon: Laurey MoraleMcLean, Dalton S, MD;  [facility];  Service: Cardiovascular;  Laterality: N/A;  . CORONARY STENT INTERVENTION N/A 12/19/2019   Procedure: CORONARY STENT INTERVENTION;  Surgeon: Marykay LexHarding, David W, MD;  [facility];  Service: Cardiovascular;  Laterality: N/A;  . ICD IMPLANT N/A 10/05/2017   Procedure: ICD IMPLANT;  Surgeon: Duke SalviaKlein, Steven C, MD;  [facility];  Service: Cardiovascular;  Laterality: N/A;  . INCONTINENCE SURGERY  2009  . IR ANGIOGRAM SELECTIVE EACH ADDITIONAL VESSEL  01/31/2017  . IR ANGIOGRAM SELECTIVE EACH ADDITIONAL VESSEL  01/31/2017  . IR ANGIOGRAM VISCERAL SELECTIVE  01/31/2017  . IR EMBO ART  VEN HEMORR LYMPH EXTRAV  INC GUIDE ROADMAPPING  01/31/2017  . IR THORACENTESIS ASP PLEURAL SPACE W/IMG GUIDE  05/11/2017  . IR THORACENTESIS ASP PLEURAL SPACE W/IMG GUIDE  05/18/2017  . IR US GUIDE VASC ACCESS RIGHT  01/31/2017  . LEFT HEART CATH AND CORS/GRAFTS ANGIOGRAPHY N/A 05/15/2017   Procedure: LEFT HEART CATH AND CORS/GRAFTS ANGIOGRAPHY;  Surgeon: Lennette BihariKelly, Thomas A, MD;  Location: [facility] LAB;  Service: Cardiovascular;  Laterality:  N/A;  . LEFT HEART CATH AND CORS/GRAFTS ANGIOGRAPHY N/A 05/17/2017   Procedure: LEFT HEART CATH AND CORS/GRAFTS ANGIOGRAPHY;  Surgeon:  Lennette Bihari, MD;  Location: [facility] INVASIVE CV LAB;  Service: Cardiovascular;  Laterality: N/A;  . NM MYOCAR PERF WALL MOTION  07/2011   bruce myoview - mild perfusion defect in basal inferolateral & mid inferolateral regions (attenuation artifact, but prior infarct annote be excluded)' EF 49%; low risk  . RIGHT/LEFT HEART CATH AND CORONARY/GRAFT ANGIOGRAPHY N/A 12/19/2019   Procedure: RIGHT/LEFT HEART CATH AND CORONARY/GRAFT ANGIOGRAPHY;  Surgeon: Laurey Morale, MD;  Location: [facility] INVASIVE CV LAB;  Service: Cardiovascular;  Laterality: N/A;  . TRANSTHORACIC ECHOCARDIOGRAM  07/2011   EF 45-50%, mild septal hypokinesis, mild inferior wall hypokinesis; RV systolc function mildly reduced; borderline LA enlargement; mild MR & TR; AV mildly sclerotic; mild pulm valve regurg      Social History   reports that she has never smoked. She has never used smokeless tobacco. She reports that she does not drink alcohol and does not use drugs.   Family History   Her family history includes Cancer in her father, maternal grandmother, and paternal grandmother; Diabetes in her child; Diabetes (age of onset: 88) in her mother; Heart attack (age of onset: 61) in her father; Heart disease in her mother; Hyperlipidemia in her mother; Thyroid disease in her sister.   Allergies Allergies  Allergen Reactions  . Crestor [Rosuvastatin] Other (See Comments)    Pain in hips and joints  . Lipitor [Atorvastatin] Other (See Comments)    Very bad pain in hips and joints     Home Medications  Prior to Admission medications   Medication Sig Start Date End Date Taking? Authorizing Provider  acetaminophen (TYLENOL) 500 MG tablet Take 1,000 mg by mouth every 6 (six) hours as needed (pain).   Yes [provider]  amiodarone (PACERONE) 200 MG tablet Take 0.5 tablets (100 mg total) by mouth daily. 05/02/18  Yes Laurey Morale, MD  carvedilol (COREG) 6.25 MG tablet TAKE 1 TABLET (6.25 MG TOTAL) BY MOUTH 2 (TWO) TIMES  DAILY WITH A MEAL (DOSE INCREASE) Patient taking differently: Take 6.25 mg by mouth 2 (two) times daily with a meal.  03/24/19  Yes Clegg, Amy D, NP  cholecalciferol (VITAMIN D) 1000 UNITS tablet Take 1,000 Units by mouth daily.    Yes [provider]  Cyanocobalamin (VITAMIN B-12 PO) Take 1 tablet by mouth daily.    Yes [provider]  ELIQUIS 5 MG TABS tablet TAKE 1 TABLET (5 MG TOTAL) BY MOUTH 2 (TWO) TIMES DAILY (STOP ASPIRIN, CONTINUE PLAVIX) Patient taking differently: Take 5 mg by mouth 2 (two) times daily.  12/02/19  Yes Laurey Morale, MD  ezetimibe (ZETIA) 10 MG tablet Take 1 tablet (10 mg total) by mouth daily. Patient taking differently: Take 10 mg by mouth at bedtime.  11/26/18 12/30/19 Yes Clegg, Amy D, NP  feeding supplement, ENSURE ENLIVE, (ENSURE ENLIVE) LIQD Take 237 mLs by mouth daily. 02/10/18  Yes Hongalgi, Maximino Greenland, MD  ferrous sulfate 325 (65 FE) MG EC tablet Take 325 mg by mouth daily.   Yes [provider]  insulin NPH Human (NOVOLIN N) 100 UNIT/ML injection Inject 12 Units into the skin at bedtime.   Yes [provider]  insulin regular (NOVOLIN R) 100 units/mL injection Inject 3-4 Units into the skin See admin instructions. Inject 3-4 units subcutaneously up to three times daily with meals   Yes [provider]  levothyroxine (SYNTHROID) 50 MCG tablet TAKE 1 TABLET EVERY DAY Patient taking differently: Take 50 mcg by mouth daily before breakfast.  11/11/19  Yes Hilty, Lisette Abu, MD  losartan (COZAAR) 25 MG tablet TAKE 1 TABLET EVERY EVENING Patient taking differently: Take 25 mg by mouth daily.  03/26/19  Yes Bensimhon, Bevelyn Buckles, MD  simvastatin (ZOCOR) 40 MG tablet TAKE 1 TABLET AT BEDTIME Patient taking differently: Take 40 mg by mouth at bedtime.  09/02/19  Yes Laurey Morale, MD  torsemide (DEMADEX) 20 MG tablet Take 20 mg by mouth daily.    Yes [provider]  venlafaxine XR (EFFEXOR-XR) 150 MG 24 hr capsule Take 1  capsule (150 mg total) by mouth daily. Patient taking differently: Take 150 mg by mouth at bedtime.  11/26/18  Yes Clegg, Amy D, NP  vitamin C (ASCORBIC ACID) 500 MG tablet Take 500 mg by mouth daily.   Yes [provider]     Critical care time: 45 min    Brett Canales Dare Sanger ACNP Acute Care Nurse Practitioner [facility] Pulmonary/Critical Care Please consult Amion 12/14/2019, 10:39 AM

## 2019-12-23 NOTE — Plan of Care (Signed)
°  Problem: Education: °Goal: Knowledge of General Education information will improve °Description: Including pain rating scale, medication(s)/side effects and non-pharmacologic comfort measures °Outcome: Progressing °  °Problem: Clinical Measurements: °Goal: Ability to maintain clinical measurements within normal limits will improve °Outcome: Progressing °  °Problem: Coping: °Goal: Level of anxiety will decrease °Outcome: Progressing °  °Problem: Elimination: °Goal: Will not experience complications related to bowel motility °Outcome: Progressing °Goal: Will not experience complications related to urinary retention °Outcome: Progressing °  °Problem: Pain Managment: °Goal: General experience of comfort will improve °Outcome: Progressing °  °Problem: Safety: °Goal: Ability to remain free from injury will improve °Outcome: Progressing °  °Problem: Skin Integrity: °Goal: Risk for impaired skin integrity will decrease °Outcome: Progressing °  °

## 2019-12-23 NOTE — Progress Notes (Signed)
Patient returned from IR at 1720hrs  with HD catheter in place R neck.  Sleeping but arousable.

## 2019-12-23 NOTE — Progress Notes (Signed)
[facility] Progress Note  Patient: [name]    ZJQ:[id number]  DOA: 12/14/2019     Date of Service: the patient was seen and examined on 12/09/2019  Brief hospital course: Past medical history of chronic systolic CHF, SP AICD implant, PAF on Eliquis, type II DM, partially vaccinated.  Presents with shortness of breath and swelling in the leg with chest pain found to have acute decompensated systolic CHF.  Tested positive for Covid. Started having fever on 8/11//2021.  Patient was treated with remdesivir also treated with antibiotics for bacterial pneumonia.  In the meantime patient was also treated with Primacor and IV diuresis.  Underwent coronary artery catheterization with stent placement.  Patient was on therapeutic heparin which was discontinued due to hematuria which currently resolved.  Now appears to have acute kidney injury on chronic kidney disease with anuria needing hemodialysis as the patient is developing uremia. Nephrology, cardiology, palliative care, advanced heart failure team are following the patient.  PCCM was consulted briefly for placement of hemodialysis catheter but it was placed by IR. Currently plan is continuing current treatment with HD and engage with family regarding goals of care conversation.  Assessment and Plan: 1.  Acute hypoxic respiratory failure CAD SP DES of proximal RPL 2 PTCA of the mid RCA on 12/19/2019 History of CABG 2008 Acute on chronic combined systolic and diastolic CHF Moderate bilateral pleural effusion Med[employer] ICD Chest pain/angina Found to have significant volume overload consistent with decompensated systolic CHF. Cardiology was consulted. Patient was started on aggressive IV diuresis. Underwent left heart cath with DES placement on 12/19/2019 as well as balloon angioplasty. Patient on Primacor after right heart cath.  Currently on hold PICC line Patient was on IV heparin for ACS protocol which is currently discontinued due to  hematuria.  2.  Acute hypoxic respiratory failure secondary to combined COVID-19 pneumonia as well as bacterial pneumonia. Oxygen requirements: Currently on 4 LPM Diuretics: Intermittent diuresis Vitamin C and Zinc: Continue DVT Prophylaxis: heparin injection 5,000 Units Start: 12/21/19 2200 Remdesivir: Started on 12/17/2019, completed Antibiotic: started for total of 5 days on 12/20/2019.,  Will complete on 12/25/2019. Prone positioning: Patient encouraged to stay in prone position as much as possible.  3.  Hematuria In the setting of being on IV heparin. CT renal protocol negative for any acute abnormality. H&H stable.  4.  Acute metabolic encephalopathy from hypoglycemia From poor p.o. intake due to n.p.o. status.  5.  Type 2 diabetes mellitus with uncontrolled hyper and hypoglycemia and renal complication Blood sugar fluctuating significantly. Continue sliding scale insulin.  Monitor.  6.  Paroxysmal A. Fib Essential hypertension Currently rate controlled. Continue amiodarone and Coreg. Initially anticoagulated with Eliquis currently on due to hematuria. Continue losartan and Coreg.  7.  Acute kidney injury on CKD stage IIIa. Likely contrast induced nephropathy possibly cholesterol emboli. Serum creatinine significantly elevated. Cardiology currently holding diuresis. Monitor response. Will support with albumin. Also contrast-induced nephropathy cannot be ruled out. Renal function does not improve tomorrow will initiate further work-up. Nephrology consulted. IR guided catheter placed.  Hemodialysis later today.  8.  UTI. Treated with Rocephin. Currently no symptoms.  9.  Hypothyroidism. TSH stable. Continue Synthroid.  10.  Mild hyperkalemia. Repeat potassium stable. Continue to monitor.  11. Leukopenia Acute thrombocytopenia In the setting of COVID-19.  Monitor.  12.  Goals of care conversation. Palliative care consulted.  Will monitor  response.  Diet: Renal diet DVT Prophylaxis:   heparin injection 5,000 Units Start: 12/21/19 2200  Advance goals of care discussion: Full code  Family Communication: no family was present at bedside, at the time of interview.  Discussed with daughter on the phone The pt provided permission to discuss medical plan with the family. Opportunity was given to ask question and all questions were answered satisfactorily.   Disposition:  Status is: Inpatient  Remains inpatient appropriate because:IV treatments appropriate due to intensity of illness or inability to take PO and Inpatient level of care appropriate due to severity of illness   Dispo: The patient is from: Home              Anticipated d/c is to: To be determined              Anticipated d/c date is: > 3 days              Patient currently is not medically stable to d/c.  Subjective: No nausea no vomiting.  No fever no chills.  No chest pain.  Continues to have shortness of breath, cough.  Appears more fatigued than yesterday.  No diarrhea no constipation.  Minimal oral intake.  Per RN later in the day patient was more confused as well.  Physical Exam:  General: Appear in moderate distress, no Rash; Oral Mucosa Clear, moist. no Abnormal Neck Mass Or lumps, Conjunctiva normal  Cardiovascular: S1 and S2 Present, no Murmur, Respiratory: increased respiratory effort, Bilateral Air entry present and bilateral  Crackles, no wheezes Abdomen: Bowel Sound present, Soft and no tenderness Extremities: trace Pedal edema, no calf tenderness Neurology: alert and oriented to time, place, and person affect flat in affect. no new focal deficit Gait not checked due to patient safety concerns  Vitals:   12/12/2019 1202 01/01/2020 1350 01/03/2020 1551 12/16/2019 1724  BP: 110/82 119/78 123/81 118/80  Pulse: 94 93 92 95  Resp: (!) 22 20 (!) 22 19  Temp: 97.8 F (36.6 C)  97.8 F (36.6 C)   TempSrc: Oral  Oral   SpO2: 96% 97% 93% 92%  Weight:       Height:        Intake/Output Summary (Last 24 hours) at 12/25/2019 1935 Last data filed at 12/29/2019 1823 Gross per 24 hour  Intake 270 ml  Output 30 ml  Net 240 ml   Filed Weights   12/21/19 0533 12/22/19 0436 12/17/2019 0837  Weight: 64 kg 65.2 kg 64.7 kg    Data Reviewed: I have personally reviewed and interpreted daily labs, tele strips, imagings as discussed above. I reviewed all nursing notes, pharmacy notes, vitals, pertinent old records I have discussed plan of care as described above with RN and patient/family.  CBC: Recent Labs  Lab 12/19/19 0348 12/19/19 1511 12/19/19 2120 12/20/19 0631 12/21/19 0644 12/22/19 0647 12/12/2019 0542  WBC 3.3*  --  1.6* 2.3* 5.1 8.9 10.3  NEUTROABS 2.0  --   --  1.8 4.5 8.2* 9.5*  HGB 11.9*   < > 11.5* 11.2* 10.7* 10.7* 11.0*  HCT 39.1   < > 38.2 36.8 34.3* 34.7* 34.9*  MCV 89.7  --  89.9 89.1 85.5 86.8 85.3  PLT 128*  --  126* 112* 144* 155 150   < > = values in this interval not displayed.   Basic Metabolic Panel: Recent Labs  Lab 12/17/19 0312 12/18/19 0346 12/21/19 0935 12/21/19 1856 12/22/19 0647 12/22/19 1814 12/25/2019 0542  NA 134*   < > 131* 130* 132* 134* 133*  K 3.9   < > 4.5  4.6 4.7 4.7 4.5  CL 95*   < > 94* 92* 93* 94* 94*  CO2 28   < > 23 26 24 26 23   GLUCOSE 92   < > 299* 407* 305* 167* 131*  BUN 21   < > 59* 63* 72* 78* 84*  CREATININE 1.31*   < > 3.11* 3.56* 4.25* 4.55* 4.68*  CALCIUM 8.0*   < > 7.3* 7.5* 7.7* 8.0* 7.9*  MG 1.7  --   --   --   --   --   --    < > = values in this interval not displayed.    Studies: No results found.  Scheduled Meds: . amiodarone  100 mg Oral Daily  . vitamin C  500 mg Oral Daily  . aspirin  81 mg Oral Daily  . azithromycin  500 mg Oral Daily  . Chlorhexidine Gluconate Cloth  6 each Topical Daily  . [START ON 12/24/2019] Chlorhexidine Gluconate Cloth  6 each Topical Q0600  . cholecalciferol  1,000 Units Oral Daily  . clopidogrel  75 mg Oral Q breakfast  .  ezetimibe  10 mg Oral QHS  . feeding supplement (ENSURE ENLIVE)  237 mL Oral Daily  . heparin injection (subcutaneous)  5,000 Units Subcutaneous Q8H  . heparin sodium (porcine)      . insulin aspart  0-15 Units Subcutaneous TID WC  . insulin aspart  3 Units Subcutaneous TID WC  . insulin detemir  5 Units Subcutaneous BID  . Ipratropium-Albuterol  1 puff Inhalation QID  . levothyroxine  50 mcg Oral Q0600  . lidocaine      . methylPREDNISolone (SOLU-MEDROL) injection  40 mg Intravenous BID  . pantoprazole  40 mg Oral Q1200  . simvastatin  40 mg Oral QHS  . sodium chloride flush  10-40 mL Intracatheter Q12H  . venlafaxine XR  150 mg Oral QHS  . vitamin B-12  1,000 mcg Oral Daily  . zinc sulfate  220 mg Oral Daily   Continuous Infusions: . cefTRIAXone (ROCEPHIN)  IV 2 g (12/19/2019 1823)   PRN Meds: acetaminophen, alum & mag hydroxide-simeth, guaiFENesin-dextromethorphan, nitroGLYCERIN, ondansetron (ZOFRAN) IV, sodium chloride flush, traMADol  Time spent: 35 minutes  Author: 12/25/19, MD [facility] 12/21/2019 7:35 PM  To reach On-call, see care teams to locate the attending and reach out via [email or web address]. Between 7PM-7AM, please contact night-coverage If you still have difficulty reaching the attending provider, please page the [facility] (Director on Call) for [facility] on amion for assistance.

## 2019-12-23 NOTE — Progress Notes (Addendum)
Patient ID: [name], female   DOB: 1945-06-21, 74 y.o.   MRN: [id number]     Advanced Heart Failure Rounding Note  PCP-Cardiologist: Chrystie Nose, MD   Subjective:   Given Covid-19 positive, full PPE and donning/doffing protocols used for face to face interaction.  SOB with minimal exertion. No urine output over night.     Lab Results  Component Value Date   CREATININE 4.68 (H) 12/20/2019   CREATININE 4.55 (H) 12/22/2019   CREATININE 4.25 (H) 12/22/2019    RHC/LHC today:  Coronary Findings  Diagnostic Dominance: Right Left Main  Short, no severe disease.  Left Anterior Descending  95% mid LAD with competitive flow from LIMA. LIMA not engaged => right-sided aortic arch, unable to cannulate left subclavian (same problem on prior cath). Radial artery not used given very weak/thready pulse and small arm.  Left Circumflex  Diffusely disease proximally up to 95%. SVG-OM1 and SVG-OM2 patent.  Right Coronary Artery  Known occlusion of the SVG-RCA. Long stented segment in proximal to mid RCA. 80% in-stent restenosis in the mid RCA. 99% stenosis mid PLV branch (possible culprit for unstable angina).  Intervention  No interventions have been documented. Right Heart  Right Heart Pressures RHC Procedural Findings: Hemodynamics (mmHg) RA 9 RV 37/10 PA 43/12, mean 25 PCWP mean 19 LV 96/20 AO 97/52  Oxygen saturations: PA 53% AO 98%  Cardiac Output (Fick) 3.05  Cardiac Index (Fick) 1.83  Cardiac Output (thermo) 2.48 Cardiac Index (thermo) 1.49      Objective:   Weight Range: 64.7 kg Body mass index is 26.09 kg/m.   Vital Signs:   Temp:  [97.6 F (36.4 C)-98.2 F (36.8 C)] 97.9 F (36.6 C) (08/17 0837) Pulse Rate:  [86-97] 95 (08/17 1046) Resp:  [15-25] 20 (08/17 1046) BP: (108-127)/(63-79) 121/71 (08/17 1046) SpO2:  [92 %-96 %] 92 % (08/17 1046) Weight:  [64.7 kg] 64.7 kg (08/17 0837) Last BM Date: 12/22/19  Weight change: Filed Weights    12/21/19 0533 12/22/19 0436 12/13/2019 0837  Weight: 64 kg 65.2 kg 64.7 kg    Intake/Output:   Intake/Output Summary (Last 24 hours) at 12/19/2019 1112 Last data filed at 01/04/2020 0904 Gross per 24 hour  Intake 500 ml  Output 25 ml  Net 475 ml      Physical Exam  CVP 10-11 General:  Appears drowsy.  HEENT: normal Neck: supple. JVP 10-11. Carotids 2+ bilat; no bruits. No lymphadenopathy or thryomegaly appreciated. Cor: PMI nondisplaced. Regular rate & rhythm. No rubs, gallops or murmurs. Lungs: Rhonchi throughout Abdomen: soft, nontender, nondistended. No hepatosplenomegaly. No bruits or masses. Good bowel sounds. Extremities: no cyanosis, clubbing, rash, edema Neuro: Drowsy oriented to self.  cranial nerves grossly intact. moves all 4 extremities w/o difficulty.    Telemetry  SR 60-70s    Labs    CBC Recent Labs    12/22/19 0647 12/24/2019 0542  WBC 8.9 10.3  NEUTROABS 8.2* 9.5*  HGB 10.7* 11.0*  HCT 34.7* 34.9*  MCV 86.8 85.3  PLT 155 150   Basic Metabolic Panel Recent Labs    24/58/09 1814 12/13/2019 0542  NA 134* 133*  K 4.7 4.5  CL 94* 94*  CO2 26 23  GLUCOSE 167* 131*  BUN 78* 84*  CREATININE 4.55* 4.68*  CALCIUM 8.0* 7.9*   Liver Function Tests No results for input(s): AST, ALT, ALKPHOS, BILITOT, PROT, ALBUMIN in the last 72 hours. No results for input(s): LIPASE, AMYLASE in the last 72 hours. Cardiac  Enzymes No results for input(s): CKTOTAL, CKMB, CKMBINDEX, TROPONINI in the last 72 hours.  BNP: BNP (last 3 results) Recent Labs    12/14/19 1355 12/14/19 2302 12/17/19 0255  BNP 4,206.6* 3,317.2* 1,726.9*    ProBNP (last 3 results) No results for input(s): PROBNP in the last 8760 hours.   D-Dimer No results for input(s): DDIMER in the last 72 hours. Hemoglobin A1C No results for input(s): HGBA1C in the last 72 hours. Fasting Lipid Panel No results for input(s): CHOL, HDL, LDLCALC, TRIG, CHOLHDL, LDLDIRECT in the last 72  hours. Thyroid Function Tests No results for input(s): TSH, T4TOTAL, T3FREE, THYROIDAB in the last 72 hours.  Invalid input(s): FREET3  Other results:   Imaging    US RENAL  Result Date: 12/22/2019 CLINICAL DATA:  Acute kidney injury. EXAM: RENAL / URINARY TRACT ULTRASOUND COMPLETE COMPARISON:  CT abdomen and pelvis 12/18/2019 FINDINGS: Right Kidney: Renal measurements: 9.6 x 4.7 x 5.7 cm = volume: 126 mL. Cortical thinning and borderline increased parenchymal echogenicity. No mass or hydronephrosis. Renal hilar echogenicity likely corresponds to vascular calcification on the recent CT. Left Kidney: Renal measurements: 10.0 x 5.3 x 5.4 cm = volume: 147 mL. Borderline cortical thinning. Echogenicity within normal limits. No mass or hydronephrosis visualized. Bladder: Appears normal for degree of bladder distention. Other: None. IMPRESSION: 1. No hydronephrosis. 2. Asymmetric right renal cortical thinning. Electronically Signed   By: Sebastian Ache M.D.   On: 12/22/2019 16:22     Medications:     Scheduled Medications: . amiodarone  100 mg Oral Daily  . vitamin C  500 mg Oral Daily  . aspirin  81 mg Oral Daily  . azithromycin  500 mg Oral Daily  . Chlorhexidine Gluconate Cloth  6 each Topical Daily  . cholecalciferol  1,000 Units Oral Daily  . clopidogrel  75 mg Oral Q breakfast  . ezetimibe  10 mg Oral QHS  . feeding supplement (ENSURE ENLIVE)  237 mL Oral Daily  . heparin injection (subcutaneous)  5,000 Units Subcutaneous Q8H  . insulin aspart  0-15 Units Subcutaneous TID WC  . insulin aspart  3 Units Subcutaneous TID WC  . insulin detemir  5 Units Subcutaneous BID  . Ipratropium-Albuterol  1 puff Inhalation QID  . levothyroxine  50 mcg Oral Q0600  . methylPREDNISolone (SOLU-MEDROL) injection  40 mg Intravenous BID  . pantoprazole  40 mg Oral Q1200  . simvastatin  40 mg Oral QHS  . sodium chloride flush  10-40 mL Intracatheter Q12H  . venlafaxine XR  150 mg Oral QHS  . vitamin  B-12  1,000 mcg Oral Daily  . zinc sulfate  220 mg Oral Daily    Infusions: . cefTRIAXone (ROCEPHIN)  IV      PRN Medications: acetaminophen, alum & mag hydroxide-simeth, guaiFENesin-dextromethorphan, nitroGLYCERIN, ondansetron (ZOFRAN) IV, sodium chloride flush, traMADol    Assessment/Plan   1. COVID-19 infection: Only got 1 dose of Pfizer vaccine, missed 2nd dose. Suspect main issues here is CHF, not COVID PNA. However, she has had periodic fevers and was started on remdesivir.  CXR with pulmonary edema, possible LLL infiltrate.  - Remdesivir per [facility].  2. Acute on chronic systolic CHF: History of ischemic cardiomyopathy.  MDT ICD.  Echo this admission with fall in EF to 15-20%, global hypokinesis, moderate LV dilation, severely decreased RV function, severe MR, moderate-severe TR.  RHC with mildly elevated filling pressures, cardiac output low.   -Placed on milrinone 0.25 mcg /kg/min after PCI to optimize cardiac  output.  Milrinone was stopped 12/21/19. CO-OX 53%. May need to restart empiric milrinone.  - Holding  losartan + spironolactone due to AKI. No dig with elevated creatinine.  - Patient has LBBB but probably not wide enough (129 msec) to benefit much from CRT.  - With frailty, think she would not be a good candidate for advanced therapies.  3. CAD: CABG x 4 2008. NSTEMI 05/2017>>LHC w/ occluded SVG-PDA and serial 70% and 90% stenoses in the proximal to mid RCA, treated w/ DES x 2 to RCA.  HS troponin this admit low level and flat, 23>>23>>36. Chest tightness on and off but now resolved, EF is lower on echo this admission.  Concern for unstable angina/small NSTEMI, coronary angiography today with stable anatomy except for 80% ISR mid RCA, 99% mid PLV stenosis (likely culprit).  - 12/19/19 S/P PCI RCA--- Continue plavix + ASA + Statin.  - eliquis stopped 12/21/19. Hold eliquis for HD cath placement.   4. UTI: Treating with ceftriaxone.  5. Type 2 diabetes: on SSI. No SGLT2 for now  with AKI  6. 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  7. Atrial fibrillation: Paroxysmal.   - Maintaining NSR.   - Continue amiodarone.  8. AKI  Renal US - no hydronephrosis.  Creatinine rising over the last few days--> 1.6>2>3.1>3.56>4.25>4.68  -Nephrology consulted. Planning for HD.       Length of Stay: 8  Amy Clegg, NP  01/06/2020, 11:12 AM  Advanced Heart Failure Team Pager [phone] (M-F; 7a - 4p)  Please contact [facility] for night-coverage after hours (4p -7a ) and weekends on [email or web address]  Agree with above.   Now essentially anuric. More SOB. Volume overloaded.   General:  Weak appearing. SOB HEENT: normal Neck: supple. JVP to jaw. Carotids 2+ bilat; no bruits. No lymphadenopathy or thryomegaly appreciated. Cor: PMI nondisplaced. Regular rate & rhythm. No rubs, gallops or murmurs. Lungs: clear + crackles Abdomen: soft, nontender, nondistended. No hepatosplenomegaly. No bruits or masses. Good bowel sounds. Extremities: no cyanosis, clubbing, rash, 1-2+ edema Neuro: alert & orientedx3, cranial nerves grossly intact. moves all 4 extremities w/o difficulty. Affect pleasant  Seen by Renal and CCM. IR to place catheter for CVVVHD. Awaiting transfer to ICU. No CP or SOB.   CRITICAL CARE Performed by: Arvilla Meres  Total critical care time: 35 minutes  Critical care time was exclusive of separately billable procedures and treating other patients.  Critical care was necessary to treat or prevent imminent or life-threatening deterioration.  Critical care was time spent personally by me (independent of midlevel providers or residents) on the following activities: development of treatment plan with patient and/or surrogate as well as nursing, discussions with consultants, evaluation of patient's response to treatment, examination of patient, obtaining history from patient or surrogate, ordering and performing treatments and  interventions, ordering and review of laboratory studies, ordering and review of radiographic studies, pulse oximetry and re-evaluation of patient's condition.  Arvilla Meres, MD  6:00 PM

## 2019-12-23 NOTE — Evaluation (Signed)
Occupational Therapy Evaluation Patient Details Name: [name] MRN: [id number] DOB: July 27, 1945 Today's Date: 12/18/2019    History of Present Illness 74 year old female admitted with right foot pain. She was found to have an infection in her foot. 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 Pt currently requiring Min A for UB ADLs, Mod-Max A for LB ADLs, and Min Guard A for functional transfers and mobility using a RW. Pt presenting with poor activity tolerance as seen by significant fatigue requiring several rest breaks during activity. SpO2 maintaining in 90s with 4L O2. HR and BP stable. Educating pt on use of IS and flutter valve; however, pt with difficulty sequencing use of flutter valve despite Max cues. Also providing education on resting in prone position. Pt would benefit from further acute OT to facilitate safe dc. Pending pt progress and level of support at home, recommend dc to SNF for further OT to optimize safety, independence with ADLs, and return to PLOF.     Follow Up Recommendations  SNF;Supervision/Assistance - 24 hour (Will need increased support for dc to home)    Equipment Recommendations  None recommended by OT    Recommendations for Other Services PT consult     Precautions / Restrictions Precautions Precautions: Fall;Other (comment) Precaution Comments: Watch SpO2      Mobility Bed Mobility Overal bed mobility: Needs Assistance Bed Mobility: Supine to Sit     Supine to sit: Mod assist;HOB elevated     General bed mobility comments: Min A to hold therapist's hand and pull into upright posture. Mod A for scooting hips towards EOB  Transfers Overall transfer level: Needs assistance Equipment used: Rolling walker (2 wheeled) Transfers: Sit to/from Stand Sit  to Stand: Min guard         General transfer comment: Min Guard A for safety. Cues for hand placement    Balance Overall balance assessment: Needs assistance Sitting-balance support: No upper extremity supported;Feet supported Sitting balance-Leahy Scale: Fair     Standing balance support: Bilateral upper extremity supported;During functional activity Standing balance-Leahy Scale: Poor Standing balance comment: Reliant on UE support                           ADL either performed or assessed with clinical judgement   ADL Overall ADL's : Needs assistance/impaired Eating/Feeding: Set up;Sitting   Grooming: Set up;Supervision/safety;Sitting   Upper Body Bathing: Minimal assistance;Sitting   Lower Body Bathing: Moderate assistance;Sit to/from stand   Upper Body Dressing : Minimal assistance;Sitting   Lower Body Dressing: Maximal assistance;Sit to/from stand Lower Body Dressing Details (indicate cue type and reason): Max A for donning socks due to fatigue and poor activity tolerance Toilet Transfer: Min guard;RW;Ambulation (Simulated to recliner; short distance) Statistician Details (indicate cue type and reason): Min Guard A for safety and cues for RW management         Functional mobility during ADLs: Min guard (Short distance to recliner) General ADL Comments: Pt presenting with poor activity tolerance, strength, balance,      Vision Baseline Vision/History: Wears glasses Wears Glasses: At all times Patient Visual Report: No change from baseline       Perception     Praxis  Pertinent Vitals/Pain Pain Assessment: Faces Faces Pain Scale: No hurt Pain Intervention(s): Monitored during session     Hand Dominance Right   Extremity/Trunk Assessment Upper Extremity Assessment Upper Extremity Assessment: Generalized weakness   Lower Extremity Assessment Lower Extremity Assessment: Defer to PT evaluation   Cervical / Trunk Assessment Cervical  / Trunk Assessment: Kyphotic   Communication Communication Communication: No difficulties   Cognition Arousal/Alertness: Lethargic Behavior During Therapy: Flat affect Overall Cognitive Status: Impaired/Different from baseline Area of Impairment: Memory;Following commands;Attention;Safety/judgement;Awareness;Problem solving                   Current Attention Level: Sustained (Short periods) Memory: Decreased short-term memory Following Commands: Follows one step commands inconsistently;Follows one step commands with increased time Safety/Judgement: Decreased awareness of safety;Decreased awareness of deficits Awareness: Emergent Problem Solving: Slow processing;Difficulty sequencing;Requires verbal cues General Comments: Pt reports she feels very foggy. States she has a hard time concentrating. Throughout conversation, pt becoming fatigued and requiring mutiple cues to answer questions or follow commands.    General Comments  SpO2 maintaining in the 90s on 4L O2. HR and BP stable    Exercises Exercises: Other exercises Other Exercises Other Exercises: IS. 10x. Pulled 500-250 ml.  Other Exercises: Attempted use of flutter valve, but pt sucking in instead of blowing out.    Shoulder Instructions      Home Living Family/patient expects to be discharged to:: Private residence Living Arrangements: Spouse/significant other Available Help at Discharge: Family Type of Home: House Home Access: Stairs to enter [employer] of Steps: ~7 Entrance Stairs-Rails: Can reach both Home Layout: One level     Bathroom Shower/Tub: Chief Strategy Officer: Standard     Home Equipment: Environmental consultant - 2 wheels;Tub bench;Cane - single point   Additional Comments: Unsure of reliability of information as pt providing inconsistent information      Prior Functioning/Environment Level of Independence: Independent with assistive device(s)        Comments: Pt reports she  is the primary caregiver for her husband. used a cane outside the house. She reports inside the house is pretty small and she uses furniture and the walls        OT Problem List: Decreased strength;Decreased range of motion;Decreased activity tolerance;Impaired balance (sitting and/or standing);Decreased safety awareness;Decreased knowledge of use of DME or AE;Decreased knowledge of precautions;Cardiopulmonary status limiting activity      OT Treatment/Interventions: Self-care/ADL training;Therapeutic exercise;Energy conservation;DME and/or AE instruction;Therapeutic activities;Patient/family education    OT Goals(Current goals can be found in the care plan section) Acute Rehab OT Goals Patient Stated Goal: Return home to be with her husband OT Goal Formulation: With patient Time For Goal Achievement: 01/06/20 Potential to Achieve Goals: Good  OT Frequency: Min 2X/week   Barriers to D/C:            Co-evaluation              AM-PAC OT "6 Clicks" Daily Activity     Outcome Measure Help from another person eating meals?: A Little Help from another person taking care of personal grooming?: A Little Help from another person toileting, which includes using toliet, bedpan, or urinal?: A Little Help from another person bathing (including washing, rinsing, drying)?: A Lot Help from another person to put on and taking off regular upper body clothing?: A Little Help from another person to put on and taking off regular lower body clothing?: A Lot 6 Click Score: 16   End of Session Equipment  Utilized During Treatment: Rolling walker;Oxygen (4L) Nurse Communication: Mobility status  Activity Tolerance: Patient limited by fatigue Patient left: in chair;with call bell/phone within reach;with chair alarm set (Chair alarm box only; RN notified)  OT Visit Diagnosis: Unsteadiness on feet (R26.81);Other abnormalities of gait and mobility (R26.89);Muscle weakness (generalized) (M62.81);Other  symptoms and signs involving cognitive function                Time: 1610-9604 OT Time Calculation (min): 50 min Charges:  OT General Charges $OT Visit: 1 Visit OT Evaluation $OT Eval Moderate Complexity: 1 Mod OT Treatments $Self Care/Home Management : 23-37 mins  Soffia Doshier MSOT, OTR/L Acute Rehab Pager: [phone] Office: [phone]  Theodoro Grist Tip Atienza 12/13/2019, 10:40 AM

## 2019-12-23 NOTE — Progress Notes (Signed)
PT Cancellation Note  Patient Details Name: [name] MRN: [id number] DOB: 07-29-1945   Cancelled Treatment:    Reason Eval/Treat Not Completed: Patient not medically ready.  Pt has declined since this am, hold per RN. 12/22/2019  Jacinto Halim., PT Acute Rehabilitation Services [phone]  (pager) [phone]  (office)   [name] 12/07/2019, 5:15 PM

## 2019-12-23 NOTE — Consult Note (Signed)
Consultation Note Date: 12/27/2019   Patient Name: [name]  DOB: Sep 03, 1945  MRN: [id number]  Age / Sex: 74 y.o., female  PCP: Georgianne Fick, MD Referring Physician: Rolly Salter, MD  Reason for Consultation: Establishing goals of care  HPI/Patient Profile: 74 y.o. female  with past medical history of systolic heart failure with AICD, a fib, DM2, CAD, HTN admitted on 12/14/2019 with chest pain, SOB, lower extremity edema consistent with CHF exacerbation, COVID positive finding (she did have one dose of vaccine). Left and Right heart cath on 8/13. Admission further complicated by kidney injury- plan for dialysis. Respiratory status deteriorating. Palliative medicine consulted for goals of care.   Clinical Assessment and Goals of Care: Chart reviewed. Evaluated patient at bedside. She is able to tell me she is in the hospital because of covid, but cannot elicit details of heart failure and problems regarding her kidneys.  She is visibly short of breath at rest, lethargic.  I attempted to discuss goals of care and her preferences regarding medical interventions including dialysis and code status.  Her response to my questions was to shrug.  She did say that her daughter would be her decision maker.  I called her daughter- Kateri Mc in attempt to engage in goals of care discussion. Cindi was audibly upset when she answered the phone and could not speak with me noting that her father was actively dying and she needed to call Hospice.   Primary Decision Maker NEXT OF KIN- patient's daughter    SUMMARY OF RECOMMENDATIONS - Full scope -PMT will continue to try and discuss goals of care care with patient if her mental status improves and with her daughter if she is available    Code Status/Advance Care Planning:  Full code  Primary Diagnoses: Present on Admission: . Essential hypertension .  PAF (paroxysmal atrial fibrillation) (HCC) . Hypothyroid . Chronic systolic CHF (congestive heart failure) (HCC) . Ischemic chest pain (HCC) . COVID-19 virus infection . Chest pain   I have reviewed the medical record, interviewed the patient and family, and examined the patient. The following aspects are pertinent.  Past Medical History:  Diagnosis Date  . Cholelithiases   . Coronary artery disease 02/2007   CABG x4  . Diabetes (HCC)   . Dyslipidemia   . HTN (hypertension)   . Hypothyroid   . PAF (paroxysmal atrial fibrillation) (HCC)    post op   Social History   Socioeconomic History  . Marital status: Married    Spouse name: Not on file  . Number of children: 1  . Years of education: 65  . Highest education level: Not on file  Occupational History  . Occupation: retired    Associate Professor: [employer]  Tobacco Use  . Smoking status: Never Smoker  . Smokeless tobacco: Never Used  Vaping Use  . Vaping Use: Never used  Substance and Sexual Activity  . Alcohol use: No  . Drug use: No  . Sexual activity: Never    Birth control/protection: Abstinence  Other Topics Concern  . Not on file  Social History Narrative  . Not on file   Social Determinants of Health   Financial Resource Strain:   . Difficulty of Paying Living Expenses:   Food Insecurity:   . Worried About Programme researcher, broadcasting/film/video in the Last Year:   . Barista in the Last Year:   Transportation Needs:   . Freight forwarder (Medical):   Marland Kitchen Lack of Transportation (Non-Medical):   Physical Activity:   . Days of Exercise per Week:   . Minutes of Exercise per Session:   Stress:   . Feeling of Stress :   Social Connections:   . Frequency of Communication with Friends and Family:   . Frequency of Social Gatherings with Friends and Family:   . Attends Religious Services:   . Active Member of Clubs or Organizations:   . Attends Banker Meetings:   Marland Kitchen Marital Status:    Family  History  Problem Relation Age of Onset  . Diabetes Mother 73  . Heart disease Mother   . Hyperlipidemia Mother   . Cancer Father   . Heart attack Father 55  . Cancer Maternal Grandmother   . Cancer Paternal Grandmother   . Thyroid disease Sister   . Diabetes Child    Scheduled Meds: . amiodarone  100 mg Oral Daily  . vitamin C  500 mg Oral Daily  . aspirin  81 mg Oral Daily  . azithromycin  500 mg Oral Daily  . Chlorhexidine Gluconate Cloth  6 each Topical Daily  . [START ON 12/24/2019] Chlorhexidine Gluconate Cloth  6 each Topical Q0600  . cholecalciferol  1,000 Units Oral Daily  . clopidogrel  75 mg Oral Q breakfast  . ezetimibe  10 mg Oral QHS  . feeding supplement (ENSURE ENLIVE)  237 mL Oral Daily  . heparin injection (subcutaneous)  5,000 Units Subcutaneous Q8H  . insulin aspart  0-15 Units Subcutaneous TID WC  . insulin aspart  3 Units Subcutaneous TID WC  . insulin detemir  5 Units Subcutaneous BID  . Ipratropium-Albuterol  1 puff Inhalation QID  . levothyroxine  50 mcg Oral Q0600  . methylPREDNISolone (SOLU-MEDROL) injection  40 mg Intravenous BID  . pantoprazole  40 mg Oral Q1200  . simvastatin  40 mg Oral QHS  . sodium chloride flush  10-40 mL Intracatheter Q12H  . venlafaxine XR  150 mg Oral QHS  . vitamin B-12  1,000 mcg Oral Daily  . zinc sulfate  220 mg Oral Daily   Continuous Infusions: . cefTRIAXone (ROCEPHIN)  IV     PRN Meds:.acetaminophen, alum & mag hydroxide-simeth, guaiFENesin-dextromethorphan, nitroGLYCERIN, ondansetron (ZOFRAN) IV, sodium chloride flush, traMADol Medications Prior to Admission:  Prior to Admission medications   Medication Sig Start Date End Date Taking? Authorizing Provider  acetaminophen (TYLENOL) 500 MG tablet Take 1,000 mg by mouth every 6 (six) hours as needed (pain).   Yes [provider]  amiodarone (PACERONE) 200 MG tablet Take 0.5 tablets (100 mg total) by mouth daily. 05/02/18  Yes Laurey Morale, MD    carvedilol (COREG) 6.25 MG tablet TAKE 1 TABLET (6.25 MG TOTAL) BY MOUTH 2 (TWO) TIMES DAILY WITH A MEAL (DOSE INCREASE) Patient taking differently: Take 6.25 mg by mouth 2 (two) times daily with a meal.  03/24/19  Yes Clegg, Amy D, NP  cholecalciferol (VITAMIN D) 1000 UNITS tablet Take 1,000 Units by mouth daily.    Yes [provider]  Cyanocobalamin (VITAMIN B-12 PO) Take 1 tablet by mouth daily.    Yes [provider]  ELIQUIS 5 MG TABS tablet TAKE 1 TABLET (5 MG TOTAL) BY MOUTH 2 (TWO) TIMES DAILY (STOP ASPIRIN, CONTINUE PLAVIX) Patient taking differently: Take 5 mg by mouth 2 (two) times daily.  12/02/19  Yes Laurey Morale, MD  ezetimibe (ZETIA) 10 MG tablet Take 1 tablet (10 mg total) by mouth daily. Patient taking differently: Take 10 mg by mouth at bedtime.  11/26/18 12/30/19 Yes Clegg, Amy D, NP  feeding supplement, ENSURE ENLIVE, (ENSURE ENLIVE) LIQD Take 237 mLs by mouth daily. 02/10/18  Yes Hongalgi, Maximino Greenland, MD  ferrous sulfate 325 (65 FE) MG EC tablet Take 325 mg by mouth daily.   Yes [provider]  insulin NPH Human (NOVOLIN N) 100 UNIT/ML injection Inject 12 Units into the skin at bedtime.   Yes [provider]  insulin regular (NOVOLIN R) 100 units/mL injection Inject 3-4 Units into the skin See admin instructions. Inject 3-4 units subcutaneously up to three times daily with meals   Yes [provider]  levothyroxine (SYNTHROID) 50 MCG tablet TAKE 1 TABLET EVERY DAY Patient taking differently: Take 50 mcg by mouth daily before breakfast.  11/11/19  Yes Hilty, Lisette Abu, MD  losartan (COZAAR) 25 MG tablet TAKE 1 TABLET EVERY EVENING Patient taking differently: Take 25 mg by mouth daily.  03/26/19  Yes Bensimhon, Bevelyn Buckles, MD  simvastatin (ZOCOR) 40 MG tablet TAKE 1 TABLET AT BEDTIME Patient taking differently: Take 40 mg by mouth at bedtime.  09/02/19  Yes Laurey Morale, MD  torsemide (DEMADEX) 20 MG tablet Take 20 mg by mouth daily.     Yes [provider]  venlafaxine XR (EFFEXOR-XR) 150 MG 24 hr capsule Take 1 capsule (150 mg total) by mouth daily. Patient taking differently: Take 150 mg by mouth at bedtime.  11/26/18  Yes Clegg, Amy D, NP  vitamin C (ASCORBIC ACID) 500 MG tablet Take 500 mg by mouth daily.   Yes [provider]   Allergies  Allergen Reactions  . Crestor [Rosuvastatin] Other (See Comments)    Pain in hips and joints  . Lipitor [Atorvastatin] Other (See Comments)    Very bad pain in hips and joints   Review of Systems  Physical Exam  Vital Signs: BP 123/81 (BP Location: Left Arm)   Pulse 92   Temp 97.8 F (36.6 C) (Oral)   Resp (!) 22   Ht 5\' 2"  (1.575 m)   Wt 64.7 kg   SpO2 93%   BMI 26.09 kg/m  Pain Scale: 0-10 POSS *See Group Information*: 1-Acceptable,Awake and alert Pain Score: 0-No pain   SpO2: SpO2: 93 % O2 Device:SpO2: 93 % O2 Flow Rate: .O2 Flow Rate (L/min): 4.5 L/min  IO: Intake/output summary:   Intake/Output Summary (Last 24 hours) at 12/11/2019 1635 Last data filed at 01/02/2020 1500 Gross per 24 hour  Intake 400 ml  Output 30 ml  Net 370 ml    LBM: Last BM Date: 12/22/19 Baseline Weight: Weight: 63.5 kg Most recent weight: Weight: 64.7 kg     Palliative Assessment/Data: PPS: 10%     Thank you for this consult. Palliative medicine will continue to follow and assist as needed.   Time In:  1500 Time Out: 1600 Time Total: 60 minutes Greater than 50%  of this time was spent counseling and coordinating care related to the above assessment and plan.  Signed by: Mariana Kaufman, AGNP-C Palliative Medicine    Please contact Palliative Medicine Team phone at [phone] for questions and concerns.  For individual provider: See Shea Evans

## 2019-12-23 NOTE — Progress Notes (Signed)
Nephrology Follow-Up Consult note   Assessment/Recommendations: [name] is a/an 74 y.o. female with a past medical history systolic heart failure status post AICD, atrial fibrillation, DM 2  who present w/ NSTEMI, Covid, now with AKI on CKD  Oliguric AKI:  Baseline creatinine 1.3.  Rising creatinine since 8/13 with decreasing urine output and associated electrolyte abnormalities.  Now with significant volume overload and minimal urine output likely needs to start dialysis today. -Start dialysis today and plan for dialysis again tomorrow -Transferring to the ICU for temporary catheter placement -Follow-up C3 -Continue to monitor urine output for recovery: The patient has a fair chance of renal recovery with time -Continue to monitor daily Cr, Dose meds for GFR -Monitor Daily I/Os, Daily weight  -Maintain MAP>65 for optimal renal perfusion.  -Avoid nephrotoxic medications including NSAIDs and Vanc/Zosyn combo   Volume Status: Appears volume overloaded with worsening respiratory status.  Plan for dialysis as above.  Minimal urine output  HFrEF/CAD: Complicated history with decreased ejection fraction of 15 to 20% and history of CABG as well as PCI this admission.    Plan for dialysis as above.  Heart failure team following.  Appreciate help.  Atrial fibrillation: Rate controlled on amiodarone.  Holding oral anticoagulation  Uncontrolled Diabetes Mellitus Type 2 with Hyperglycemia: Exacerbated by steroids.  Defer management to primary team.  Covid: Partially vaccinated.  Disease seem to be relatively minimal.  Also treating for possible pneumonia.  Antibiotics and treatment of Covid per primary team.   Recommendations conveyed to primary service.    Darnell Level [facility] 01/03/2020 12:54 PM  ___________________________________________________________  CC: AKI on CKD  Interval History/Subjective: Patient states that she is not feeling well.  She  continues to have shortness of breath.  Her lower extremities hurt particularly at the knees.  She has made nearly no urine over the past 24 hours.   Medications:  Current Facility-Administered Medications  Medication Dose Route Frequency Provider Last Rate Last Admin  . acetaminophen (TYLENOL) tablet 650 mg  650 mg Oral Q6H PRN Marykay Lex, MD   650 mg at 12/18/19 2210  . alum & mag hydroxide-simeth (MAALOX/MYLANTA) 200-200-20 MG/5ML suspension 30 mL  30 mL Oral Q6H PRN Rolly Salter, MD   30 mL at 12/20/19 1906  . amiodarone (PACERONE) tablet 100 mg  100 mg Oral Daily Marykay Lex, MD   100 mg at 12/31/2019 0913  . ascorbic acid (VITAMIN C) tablet 500 mg  500 mg Oral Daily Marykay Lex, MD   500 mg at 12/10/2019 0914  . aspirin chewable tablet 81 mg  81 mg Oral Daily Marykay Lex, MD   81 mg at 12/12/2019 0914  . azithromycin (ZITHROMAX) tablet 500 mg  500 mg Oral Daily Rolly Salter, MD   500 mg at 01/05/2020 1012  . cefTRIAXone (ROCEPHIN) 2 g in sodium chloride 0.9 % 100 mL IVPB  2 g Intravenous Q24H Rolly Salter, MD      . Chlorhexidine Gluconate Cloth 2 % PADS 6 each  6 each Topical Daily Rolly Salter, MD   6 each at 12/17/2019 1100  . [START ON 12/24/2019] Chlorhexidine Gluconate Cloth 2 % PADS 6 each  6 each Topical Q0600 Darnell Level, MD      . cholecalciferol (VITAMIN D3) tablet 1,000 Units  1,000 Units Oral Daily Marykay Lex, MD   1,000 Units at 01/03/2020 0914  . clopidogrel (PLAVIX) tablet 75 mg  75  mg Oral Q breakfast Bensimhon, Bevelyn Buckles, MD   75 mg at 12/26/2019 0913  . ezetimibe (ZETIA) tablet 10 mg  10 mg Oral QHS Marykay Lex, MD   10 mg at 12/22/19 2120  . feeding supplement (ENSURE ENLIVE) (ENSURE ENLIVE) liquid 237 mL  237 mL Oral Daily Marykay Lex, MD   237 mL at 12/22/19 1152  . guaiFENesin-dextromethorphan (ROBITUSSIN DM) 100-10 MG/5ML syrup 10 mL  10 mL Oral Q4H PRN Marykay Lex, MD      . heparin injection 5,000 Units  5,000 Units  Subcutaneous Q8H Rolly Salter, MD   5,000 Units at 12/17/2019 0529  . insulin aspart (novoLOG) injection 0-15 Units  0-15 Units Subcutaneous TID WC Marykay Lex, MD   3 Units at 12/11/2019 1158  . insulin aspart (novoLOG) injection 3 Units  3 Units Subcutaneous TID WC Rolly Salter, MD   3 Units at 12/22/19 1715  . insulin detemir (LEVEMIR) injection 5 Units  5 Units Subcutaneous BID Rolly Salter, MD   5 Units at 12/29/2019 0912  . Ipratropium-Albuterol (COMBIVENT) respimat 1 puff  1 puff Inhalation QID Rolly Salter, MD   1 puff at 12/18/2019 1200  . levothyroxine (SYNTHROID) tablet 50 mcg  50 mcg Oral Q0600 Marykay Lex, MD   50 mcg at 12/20/2019 0528  . methylPREDNISolone sodium succinate (SOLU-MEDROL) 40 mg/mL injection 40 mg  40 mg Intravenous BID Rolly Salter, MD   40 mg at 12/08/2019 0920  . nitroGLYCERIN (NITROSTAT) SL tablet 0.4 mg  0.4 mg Sublingual Q5 min PRN Marykay Lex, MD      . ondansetron [facility]) injection 4 mg  4 mg Intravenous Q6H PRN Marykay Lex, MD      . pantoprazole (PROTONIX) EC tablet 40 mg  40 mg Oral Q1200 Marykay Lex, MD   40 mg at 12/29/2019 1157  . simvastatin (ZOCOR) tablet 40 mg  40 mg Oral QHS Marykay Lex, MD   40 mg at 12/22/19 2120  . sodium chloride flush (NS) 0.9 % injection 10-40 mL  10-40 mL Intracatheter Q12H Rolly Salter, MD   10 mL at 12/26/2019 0921  . sodium chloride flush (NS) 0.9 % injection 10-40 mL  10-40 mL Intracatheter PRN Rolly Salter, MD   10 mL at 01/04/2020 0529  . traMADol (ULTRAM) tablet 50 mg  50 mg Oral Q6H PRN Marykay Lex, MD   50 mg at 12/21/19 0048  . venlafaxine XR (EFFEXOR-XR) 24 hr capsule 150 mg  150 mg Oral QHS Marykay Lex, MD   150 mg at 12/22/19 2121  . vitamin B-12 (CYANOCOBALAMIN) tablet 1,000 mcg  1,000 mcg Oral Daily Marykay Lex, MD   1,000 mcg at 12/27/2019 0913  . zinc sulfate capsule 220 mg  220 mg Oral Daily Marykay Lex, MD   220 mg at 12/14/2019 0913      Review of  Systems: 10 systems reviewed and negative except per interval history/subjective  Physical Exam: Vitals:   12/12/2019 1046 12/26/2019 1202  BP: 121/71 110/82  Pulse: 95 94  Resp: 20 (!) 22  Temp:  97.8 F (36.6 C)  SpO2: 92% 96%   Total I/O In: 50 [P.O.:50] Out: -   Intake/Output Summary (Last 24 hours) at 12/26/2019 1254 Last data filed at 12/08/2019 8675 Gross per 24 hour  Intake 350 ml  Output --  Net 350 ml   General: Chronically ill-appearing, lying in  bed, no distress HEENT: anicteric sclera, oropharynx clear without lesions CV: Normal rate, no audible murmur, bilateral lower extremity edema Lungs: Bilateral crackles, no increased work of breathing, nasal cannula on the forehead Abd: soft, nondistended, bowel sounds present Skin: no visible lesions or rashes Psych: alert, engaged, appropriate mood and affect Musculoskeletal: no obvious deformities Neuro: normal speech, no gross focal deficits    Test Results I personally reviewed new and old clinical labs and radiology tests Lab Results  Component Value Date   NA 133 (L) 12/09/2019   K 4.5 01/05/2020   CL 94 (L) 12/13/2019   CO2 23 12/13/2019   BUN 84 (H) 12/07/2019   CREATININE 4.68 (H) 12/14/2019   CALCIUM 7.9 (L) 01/06/2020   ALBUMIN 2.4 (L) 12/20/2019   PHOS 3.0 05/18/2017

## 2019-12-24 ENCOUNTER — Inpatient Hospital Stay (HOSPITAL_COMMUNITY): Payer: Medicare Other

## 2019-12-24 DIAGNOSIS — Z7189 Other specified counseling: Secondary | ICD-10-CM | POA: Diagnosis not present

## 2019-12-24 DIAGNOSIS — I509 Heart failure, unspecified: Secondary | ICD-10-CM

## 2019-12-24 DIAGNOSIS — N179 Acute kidney failure, unspecified: Secondary | ICD-10-CM | POA: Diagnosis not present

## 2019-12-24 DIAGNOSIS — Z515 Encounter for palliative care: Secondary | ICD-10-CM | POA: Diagnosis not present

## 2019-12-24 DIAGNOSIS — J849 Interstitial pulmonary disease, unspecified: Secondary | ICD-10-CM | POA: Diagnosis not present

## 2019-12-24 DIAGNOSIS — U071 COVID-19: Secondary | ICD-10-CM | POA: Diagnosis not present

## 2019-12-24 DIAGNOSIS — I5023 Acute on chronic systolic (congestive) heart failure: Secondary | ICD-10-CM

## 2019-12-24 LAB — CBC WITH DIFFERENTIAL/PLATELET
Abs Immature Granulocytes: 0.1 10*3/uL — ABNORMAL HIGH (ref 0.00–0.07)
Basophils Absolute: 0 10*3/uL (ref 0.0–0.1)
Basophils Relative: 0 %
Eosinophils Absolute: 0 10*3/uL (ref 0.0–0.5)
Eosinophils Relative: 0 %
HCT: 35.8 % — ABNORMAL LOW (ref 36.0–46.0)
Hemoglobin: 11.1 g/dL — ABNORMAL LOW (ref 12.0–15.0)
Immature Granulocytes: 1 %
Lymphocytes Relative: 1 %
Lymphs Abs: 0.1 10*3/uL — ABNORMAL LOW (ref 0.7–4.0)
MCH: 26.4 pg (ref 26.0–34.0)
MCHC: 31 g/dL (ref 30.0–36.0)
MCV: 85 fL (ref 80.0–100.0)
Monocytes Absolute: 0.7 10*3/uL (ref 0.1–1.0)
Monocytes Relative: 7 %
Neutro Abs: 10.2 10*3/uL — ABNORMAL HIGH (ref 1.7–7.7)
Neutrophils Relative %: 91 %
Platelets: 145 10*3/uL — ABNORMAL LOW (ref 150–400)
RBC: 4.21 MIL/uL (ref 3.87–5.11)
RDW: 16.1 % — ABNORMAL HIGH (ref 11.5–15.5)
WBC: 11.2 10*3/uL — ABNORMAL HIGH (ref 4.0–10.5)
nRBC: 0 % (ref 0.0–0.2)

## 2019-12-24 LAB — COMPREHENSIVE METABOLIC PANEL
ALT: 14 U/L (ref 0–44)
AST: 24 U/L (ref 15–41)
Albumin: 2.6 g/dL — ABNORMAL LOW (ref 3.5–5.0)
Alkaline Phosphatase: 63 U/L (ref 38–126)
Anion gap: 15 (ref 5–15)
BUN: 56 mg/dL — ABNORMAL HIGH (ref 8–23)
CO2: 24 mmol/L (ref 22–32)
Calcium: 8.2 mg/dL — ABNORMAL LOW (ref 8.9–10.3)
Chloride: 98 mmol/L (ref 98–111)
Creatinine, Ser: 3.81 mg/dL — ABNORMAL HIGH (ref 0.44–1.00)
GFR calc Af Amer: 13 mL/min — ABNORMAL LOW (ref >60)
GFR calc non Af Amer: 11 mL/min — ABNORMAL LOW (ref >60)
Glucose, Bld: 112 mg/dL — ABNORMAL HIGH (ref 70–99)
Potassium: 4.4 mmol/L (ref 3.5–5.1)
Sodium: 137 mmol/L (ref 135–145)
Total Bilirubin: 0.9 mg/dL (ref 0.3–1.2)
Total Protein: 6 g/dL — ABNORMAL LOW (ref 6.5–8.1)

## 2019-12-24 LAB — GLUCOSE, CAPILLARY
Glucose-Capillary: 115 mg/dL — ABNORMAL HIGH (ref 70–99)
Glucose-Capillary: 126 mg/dL — ABNORMAL HIGH (ref 70–99)
Glucose-Capillary: 137 mg/dL — ABNORMAL HIGH (ref 70–99)
Glucose-Capillary: 122 mg/dL — ABNORMAL HIGH (ref 70–99)

## 2019-12-24 LAB — COOXEMETRY PANEL
Carboxyhemoglobin: 1.1 % (ref 0.5–1.5)
Methemoglobin: 0.9 % (ref 0.0–1.5)
O2 Saturation: 47.2 %
Total hemoglobin: 11.8 g/dL — ABNORMAL LOW (ref 12.0–16.0)

## 2019-12-24 LAB — PHOSPHORUS: Phosphorus: 4.9 mg/dL — ABNORMAL HIGH (ref 2.5–4.6)

## 2019-12-24 LAB — MAGNESIUM: Magnesium: 2.5 mg/dL — ABNORMAL HIGH (ref 1.7–2.4)

## 2019-12-24 LAB — C-REACTIVE PROTEIN: CRP: 3.1 mg/dL — ABNORMAL HIGH (ref <1.0)

## 2019-12-24 MED ORDER — ANTICOAGULANT SODIUM CITRATE 4% (200MG/5ML) IV SOLN
5.0000 mL | Freq: Once | Status: AC
  Administered 2019-12-24: 5 mL
  Filled 2019-12-24: qty 5

## 2019-12-24 MED ORDER — METHYLPREDNISOLONE SODIUM SUCC 40 MG IJ SOLR
40.0000 mg | Freq: Every day | INTRAMUSCULAR | Status: DC
  Administered 2019-12-25: 40 mg via INTRAVENOUS
  Filled 2019-12-24: qty 1

## 2019-12-24 MED ORDER — OXYCODONE HCL 5 MG/5ML PO SOLN
5.0000 mg | ORAL | Status: DC | PRN
  Administered 2019-12-25: 5 mg via ORAL
  Filled 2019-12-24: qty 5

## 2019-12-24 NOTE — Progress Notes (Signed)
PROGRESS NOTE    [name]  GUY:[id number] DOB: [birth year] DOA: 12/14/2019 PCP: Merrilee Seashore, MD    Brief Narrative:  Past medical history of chronic systolic CHF, SP AICD implant, PAF on Eliquis, type II DM, partially vaccinated. Presents with shortness of breath and swelling in the leg with chest pain, found to have acute decompensated systolic CHF. Tested positive for Covid. Started having fever on 8/11//2021.  Patient was treated with remdesivir also treated with antibiotics for bacterial pneumonia.  In the meantime patient was also treated with Primacor and IV diuresis.  Underwent coronary artery catheterization with stent placement.  Patient was on therapeutic heparin which was discontinued due to hematuria which currently resolved.  Now appears to have acute kidney injury on chronic kidney disease with anuria needing hemodialysis as the patient is developing uremia. Nephrology, cardiology, palliative care, advanced heart failure team are following the patient.  PCCM was consulted briefly for placement of hemodialysis catheter but it was placed by IR.   Assessment & Plan:   Active Problems:   Diabetes ([place])   Essential hypertension   PAF (paroxysmal atrial fibrillation) (HCC)   Hypothyroid   Chronic systolic CHF (congestive heart failure) (HCC)   Ischemic chest pain ([place])   COVID-19 virus infection   Chest pain   Acute on chronic congestive heart failure ([place])   Advanced care planning/counseling discussion   Palliative care by specialist   Interstitial pneumonia of both lungs ([place])   Goals of care, counseling/discussion  Acute hypoxemic respiratory failure: Multifactorial.  COVID-19 pneumonia, bacterial pneumonia, acute systolic heart failure: Mostly from acute systolic heart failure component.  Ejection fraction 15%.  COVID-19 viral infection with pneumonia: Patient was treated with IV steroids that is continued now.  She received 5 days of remdesivir therapy.   Overall with poor recovery.  Remains on 4 to 5 L of oxygen.  She has poor participation, however we will continue to work on chest physiotherapy as much as possible.  SpO2: 93 % O2 Flow Rate (L/min): 5 L/min  COVID-19 Labs  Recent Labs    12/22/19 0647 12/29/2019 0542 12/24/19 0500  CRP 1.7* 2.6* 3.1*    Lab Results  Component Value Date   SARSCOV2NAA POSITIVE (A) 12/14/2019   [place] NEGATIVE 10/29/2019   Acute on chronic combined systolic and diastolic congestive heart failure/[employer] ICD/chest pain with angina: Ejection fraction 15 to 20%. Was on milrinone, stopped now. Followed by heart failure team. Losartan and spironolactone on hold due to AKI. Status post PCI RCA 8/13 Aspirin, Plavix, Zetia.  Statin intolerance. Diuretics on hold. Not doing well recovery.  Acute metabolic encephalopathy: still confused  Acute kidney injury on chronic kidney disease stage IIIa: Baseline creatinine about 1.3.  Significant volume overload.  Started on hemodialysis. Received temporary dialysis catheter and first hemodialysis on 8/17. Plan for another hemodialysis session today.  Paroxysmal A. fib: Rate controlled in sinus rhythm currently on amiodarone.  Holding anticoagulation due to hematuria that has resolved now.  Uncontrolled type 2 diabetes with hyperglycemia: On insulin.  Acute UTI: On Rocephin.  Hypothyroidism: On Synthroid.  Leukopenia/acute thrombocytopenia: Due to acute infection.  Stable.  DVT prophylaxis: On heparin.  Will discontinue as she has ongoing bleeding from the dialysis access.  Goal of care discussion: Multiple discussion by palliative team and myself with family.  Unfortunately, her husband died yesterday on home hospice.  She is also not doing well.  She is lethargic, not eating well, failure to thrive and not responding to treatment.  We discussed about different options including palliative and hospice. Appreciate palliative team involvement to  facilitate communication and goal of care. The plan is to continue treatment, tried hemodialysis today.  DNR/DNI.  No escalation of care. If patient is to decompensate acutely, will allow family to visit in the hospital and change to comfort care measures.   Code Status: DNR Family Communication: Patient's son in law and daughter on the phone Disposition Plan: Status is: Inpatient  Remains inpatient appropriate because:IV treatments appropriate due to intensity of illness or inability to take PO   Dispo: The patient is from: Home              Anticipated d/c is to: Unknown at this time unknown              Anticipated d/c date is: 3 days              Patient currently is not medically stable to d/c.         Consultants:   Cardiology  Nephrology  Procedures:   PCI  Hemodialysis  Antimicrobials:  Antibiotics Given (last 72 hours)    Date/Time Action Medication Dose Rate   12/21/19 1747 New Bag/Given   cefTRIAXone (ROCEPHIN) 2 g in sodium chloride 0.9 % 100 mL IVPB 2 g 200 mL/hr   12/22/19 0906 Given   azithromycin (ZITHROMAX) tablet 500 mg 500 mg    12/22/19 1156 New Bag/Given   remdesivir 100 mg in sodium chloride 0.9 % 100 mL IVPB 100 mg 200 mL/hr   12/22/19 1721 New Bag/Given   cefTRIAXone (ROCEPHIN) 2 g in sodium chloride 0.9 % 100 mL IVPB 2 g 200 mL/hr   12/16/2019 1012 Given   azithromycin (ZITHROMAX) tablet 500 mg 500 mg    12/22/2019 1823 New Bag/Given   cefTRIAXone (ROCEPHIN) 2 g in sodium chloride 0.9 % 100 mL IVPB 2 g 200 mL/hr         Subjective: Confused, on 4 liters oxygen.  Patient evaluated with worsening confusion reported by bedside nurses. On evaluation, she was very lethargic.  She was confused.  Followed simple commands. There is some intermittent bleeding from the right IJ stopped with pressure dressing.  Objective: Vitals:   12/24/19 0918 12/24/19 1153 12/24/19 1409 12/24/19 1618  BP:  124/82 128/79 133/80  Pulse:  (!) 101 100 (!) 105    Resp:  (!) $Re'22 18 18  'pUQ$ Temp:  97.9 F (36.6 C)  97.9 F (36.6 C)  TempSrc:  Axillary  Oral  SpO2: 93% 94% 93% 93%  Weight:      Height:        Intake/Output Summary (Last 24 hours) at 12/24/2019 1712 Last data filed at 12/24/2019 1435 Gross per 24 hour  Intake 155 ml  Output 1000 ml  Net -845 ml   Filed Weights   12/20/2019 0837 12/24/19 0150 12/24/19 0356  Weight: 64.7 kg 66.5 kg 65.5 kg    Examination:  General exam: Acutely and chronically sick looking lady, not in any distress, however confused and lethargic. Respiratory system: Clear to auscultation. Respiratory effort normal.  Mostly conducted airway sounds. Cardiovascular system: S1 & S2 heard, RRR.  1+ bilateral pedal edema. Gastrointestinal system: Abdomen is nondistended, soft and nontender. No organomegaly or masses felt. Normal bowel sounds heard. Central nervous system: Alert and oriented x1.  Lethargic. Right IJ line with temporary dialysis catheter, some trace blood beneath the dressing, no active wheezing.   Data Reviewed: I have personally reviewed following  labs and imaging studies  CBC: Recent Labs  Lab 12/20/19 0631 12/21/19 0644 12/22/19 0647 12/20/2019 0542 12/24/19 0500  WBC 2.3* 5.1 8.9 10.3 11.2*  NEUTROABS 1.8 4.5 8.2* 9.5* 10.2*  HGB 11.2* 10.7* 10.7* 11.0* 11.1*  HCT 36.8 34.3* 34.7* 34.9* 35.8*  MCV 89.1 85.5 86.8 85.3 85.0  PLT 112* 144* 155 150 944*   Basic Metabolic Panel: Recent Labs  Lab 12/21/19 1856 12/22/19 0647 12/22/19 1814 12/30/2019 0542 12/24/19 0500  NA 130* 132* 134* 133* 137  K 4.6 4.7 4.7 4.5 4.4  CL 92* 93* 94* 94* 98  CO2 $Re'26 24 26 23 24  'egK$ GLUCOSE 407* 305* 167* 131* 112*  BUN 63* 72* 78* 84* 56*  CREATININE 3.56* 4.25* 4.55* 4.68* 3.81*  CALCIUM 7.5* 7.7* 8.0* 7.9* 8.2*  MG  --   --   --   --  2.5*  PHOS  --   --   --   --  4.9*   GFR: Estimated Creatinine Clearance: 11.3 mL/min (A) (by C-G formula based on SCr of 3.81 mg/dL (H)). Liver Function  Tests: Recent Labs  Lab 12/18/19 0346 12/19/19 0348 12/20/19 0631 12/24/19 0500  AST 40 43* 41 24  ALT $Re'16 17 19 14  'Kpw$ ALKPHOS 58 66 56 63  BILITOT 0.8 0.8 0.8 0.9  PROT 6.1* 6.2* 5.3* 6.0*  ALBUMIN 2.9* 2.8* 2.4* 2.6*   No results for input(s): LIPASE, AMYLASE in the last 168 hours. No results for input(s): AMMONIA in the last 168 hours. Coagulation Profile: No results for input(s): INR, PROTIME in the last 168 hours. Cardiac Enzymes: No results for input(s): CKTOTAL, CKMB, CKMBINDEX, TROPONINI in the last 168 hours. BNP (last 3 results) No results for input(s): PROBNP in the last 8760 hours. HbA1C: No results for input(s): HGBA1C in the last 72 hours. CBG: Recent Labs  Lab 01/03/2020 1548 01/05/2020 2129 12/24/19 0856 12/24/19 1151 12/24/19 1616  GLUCAP 114* 135* 115* 126* 137*   Lipid Profile: No results for input(s): CHOL, HDL, LDLCALC, TRIG, CHOLHDL, LDLDIRECT in the last 72 hours. Thyroid Function Tests: No results for input(s): TSH, T4TOTAL, FREET4, T3FREE, THYROIDAB in the last 72 hours. Anemia Panel: No results for input(s): VITAMINB12, FOLATE, FERRITIN, TIBC, IRON, RETICCTPCT in the last 72 hours. Sepsis Labs: Recent Labs  Lab 12/20/19 1356 12/21/19 0644 12/22/19 0647  PROCALCITON 0.67 0.85 0.87    Recent Results (from the past 240 hour(s))  SARS Coronavirus 2 by RT PCR (hospital order, performed in [facility] hospital lab) Nasopharyngeal Nasopharyngeal Swab     Status: Abnormal   Collection Time: 12/14/19  7:59 PM   Specimen: Nasopharyngeal Swab  Result Value Ref Range Status   SARS Coronavirus 2 POSITIVE (A) NEGATIVE Final    Comment: RESULT CALLED TO, READ BACK BY AND VERIFIED WITH: RN C PRICE $Remov'@2203'FwOzAM$  12/14/19 BY S GEZAHEGN (NOTE) SARS-CoV-2 target nucleic acids are DETECTED  SARS-CoV-2 RNA is generally detectable in upper respiratory specimens  during the acute phase of infection.  Positive results are indicative  of the presence of the identified  virus, but do not rule out bacterial infection or co-infection with other pathogens not detected by the test.  Clinical correlation with patient history and  other diagnostic information is necessary to determine patient infection status.  The expected result is negative.  Fact Sheet for Patients:   StrictlyIdeas.no   Fact Sheet for Healthcare Providers:   BankingDealers.co.za    This test is not yet approved or cleared by the [place]  States FDA and  has been authorized for detection and/or diagnosis of SARS-CoV-2 by FDA under an Emergency Use Authorization (EUA).  This EUA will remain in effect (meaning this  test can be used) for the duration of  the COVID-19 declaration under Section 564(b)(1) of the Act, 21 U.S.C. section 360-bbb-3(b)(1), unless the authorization is terminated or revoked sooner.  Performed at [facility], [place] [address], [place], [place] [address]   Culture, blood (routine x 2)     Status: None   Collection Time: 12/17/19  3:12 AM   Specimen: BLOOD RIGHT HAND  Result Value Ref Range Status   Specimen Description BLOOD RIGHT HAND  Final   Special Requests   Final    BOTTLES DRAWN AEROBIC AND ANAEROBIC Blood Culture adequate volume   Culture   Final    NO GROWTH 5 DAYS Performed at [facility], [place] [address] [place], [place] [address]    Report Status 12/22/2019 FINAL  Final  Culture, blood (routine x 2)     Status: None   Collection Time: 12/17/19  3:12 AM   Specimen: BLOOD RIGHT ARM  Result Value Ref Range Status   Specimen Description BLOOD RIGHT ARM  Final   Special Requests   Final    BOTTLES DRAWN AEROBIC AND ANAEROBIC Blood Culture results may not be optimal due to an inadequate volume of blood received in culture bottles   Culture   Final    NO GROWTH 5 DAYS Performed at [facility], [place] [address], [place], [place] [address]    Report Status 12/22/2019 FINAL  Final  Culture,  Urine     Status: Abnormal   Collection Time: 12/17/19 10:49 AM   Specimen: Urine, Random  Result Value Ref Range Status   Specimen Description URINE, RANDOM  Final   Special Requests NONE  Final   Culture (A)  Final    <10,000 COLONIES/mL INSIGNIFICANT GROWTH Performed at [place] [address], [place], [place] [address]    Report Status 12/18/2019 FINAL  Final         Radiology Studies: IR Fluoro Guide CV Line Right  Result Date: 12/24/2019 INDICATION: 74 year old female referred for temporary hemodialysis catheter placement EXAM: IMAGE GUIDED TEMPORARY HEMODIALYSIS CATHETER MEDICATIONS: None ANESTHESIA/SEDATION: None FLUOROSCOPY TIME:  Fluoroscopy Time: 0 minutes 6 seconds (1 mGy). COMPLICATIONS: None PROCEDURE: Informed written consent was obtained from the patient's family after a discussion of the risks, benefits, and alternatives to treatment. Questions regarding the procedure were encouraged and answered. The right neck was prepped with chlorhexidine in a sterile fashion, and a sterile drape was applied covering the operative field. Maximum barrier sterile technique with sterile gowns and gloves were used for the procedure. A timeout was performed prior to the initiation of the procedure. A micropuncture kit was utilized to access the right internal jugular vein under direct, real-time ultrasound guidance after the overlying soft tissues were anesthetized with 1% lidocaine with epinephrine. Ultrasound image documentation was performed. The microwire was kinked to measure appropriate catheter length. A stiff glidewire was advanced to the level of the IVC. A 16 cm hemodialysis catheter was then placed over the wire. Final catheter positioning was confirmed and documented with a spot radiographic image. The catheter aspirates and flushes normally. The catheter was flushed with appropriate volume heparin dwells. Dressings were applied. The patient tolerated the procedure well  without immediate post procedural complication. IMPRESSION: Status post right IJ temporary hemodialysis catheter.  Signed, Dulcy Fanny. Dellia Nims, RPVI [facility] Radiology Electronically Signed   By: Corrie Mckusick D.O.   On: 12/24/2019 08:13   IR US Guide Vasc Access Right  Result Date: 12/24/2019 INDICATION: 74 year old female referred for temporary hemodialysis catheter placement EXAM: IMAGE GUIDED TEMPORARY HEMODIALYSIS CATHETER MEDICATIONS: None ANESTHESIA/SEDATION: None FLUOROSCOPY TIME:  Fluoroscopy Time: 0 minutes 6 seconds (1 mGy). COMPLICATIONS: None PROCEDURE: Informed written consent was obtained from the patient's family after a discussion of the risks, benefits, and alternatives to treatment. Questions regarding the procedure were encouraged and answered. The right neck was prepped with chlorhexidine in a sterile fashion, and a sterile drape was applied covering the operative field. Maximum barrier sterile technique with sterile gowns and gloves were used for the procedure. A timeout was performed prior to the initiation of the procedure. A micropuncture kit was utilized to access the right internal jugular vein under direct, real-time ultrasound guidance after the overlying soft tissues were anesthetized with 1% lidocaine with epinephrine. Ultrasound image documentation was performed. The microwire was kinked to measure appropriate catheter length. A stiff glidewire was advanced to the level of the IVC. A 16 cm hemodialysis catheter was then placed over the wire. Final catheter positioning was confirmed and documented with a spot radiographic image. The catheter aspirates and flushes normally. The catheter was flushed with appropriate volume heparin dwells. Dressings were applied. The patient tolerated the procedure well without immediate post procedural complication. IMPRESSION: Status post right IJ temporary hemodialysis catheter. Signed, Dulcy Fanny.  Dellia Nims, RPVI [facility] Radiology Electronically Signed   By: Corrie Mckusick D.O.   On: 12/24/2019 08:13   DG Chest Port 1 View  Result Date: 12/24/2019 CLINICAL DATA:  Respiratory distress.  COVID-19 pneumonia. EXAM: PORTABLE CHEST 1 VIEW COMPARISON:  12/19/2019 FINDINGS: Right internal jugular line tip at high right atrium. Right-sided PICC line tip is difficult to visualize. Pacer/AICD device. Prior median sternotomy. Normal heart size. No definite pleural fluid. No pneumothorax. Diffuse interstitial and airspace disease, slightly progressive in the left upper lung. IMPRESSION: Worsening diffuse interstitial and airspace disease which most likely represents COVID-19 pneumonia. A component of congestive heart failure could look similar. No pneumothorax or other acute complication. Electronically Signed   By: Abigail Miyamoto M.D.   On: 12/24/2019 08:18        Scheduled Meds: . amiodarone  100 mg Oral Daily  . vitamin C  500 mg Oral Daily  . aspirin  81 mg Oral Daily  . azithromycin  500 mg Oral Daily  . Chlorhexidine Gluconate Cloth  6 each Topical Daily  . Chlorhexidine Gluconate Cloth  6 each Topical Q0600  . cholecalciferol  1,000 Units Oral Daily  . clopidogrel  75 mg Oral Q breakfast  . ezetimibe  10 mg Oral QHS  . feeding supplement (ENSURE ENLIVE)  237 mL Oral Daily  . insulin aspart  0-15 Units Subcutaneous TID WC  . insulin aspart  3 Units Subcutaneous TID WC  . insulin detemir  5 Units Subcutaneous BID  . Ipratropium-Albuterol  1 puff Inhalation QID  . levothyroxine  50 mcg Oral Q0600  . methylPREDNISolone (SOLU-MEDROL) injection  40 mg Intravenous BID  . pantoprazole  40 mg Oral Q1200  . simvastatin  40 mg Oral QHS  . sodium chloride flush  10-40 mL Intracatheter Q12H  . venlafaxine XR  150 mg Oral QHS  . vitamin B-12  1,000 mcg Oral Daily  . zinc  sulfate  220 mg Oral Daily   Continuous Infusions: . cefTRIAXone  (ROCEPHIN)  IV Stopped (12/11/2019 1853)     LOS: 9 days    Time spent: 35 minutes    Barb Merino, MD [facility] Pager [phone]

## 2019-12-24 NOTE — Progress Notes (Signed)
Daily Progress Note   Patient Name: [name]       Date: 12/24/2019 DOB: 1945-10-11  Age: 74 y.o. MRN#: [id number] Attending Physician: Dorcas Carrow, MD Primary Care Physician: Georgianne Fick, MD Admit Date: 12/14/2019  Reason for Consultation/Follow-up: Establishing goals of care  Subjective: Zen appears worse today. She is oriented to place- but not time or situation. Very lethargic. Noted chest xray concerning for "worsening diffuse interstitial and airspace disease which most likely represents COVID-19 pneumonia. A component of congestive heart failure could look similar".  I called and spoke to her family. Her son in law- Georgeanna Harrison was primary contact due to patient's daughter- Cindi grieving the loss of her father (patient's husband who died yesterday).  I discussed with Leonette Most the concerns that Aaylah is continuing to decline despite current interventions.  Code status was discussed.  Continued aggressive medical care vs comfort measures were discussed.  Family was very concerned with being allowed to see [place]. We discussed if patient were comfort measures and death were imminent visitor policy would allow for visitation to occur.    Review of Systems  Unable to perform ROS: Acuity of condition    Length of Stay: 9  Current Medications: Scheduled Meds:   amiodarone  100 mg Oral Daily   vitamin C  500 mg Oral Daily   aspirin  81 mg Oral Daily   azithromycin  500 mg Oral Daily   Chlorhexidine Gluconate Cloth  6 each Topical Daily   Chlorhexidine Gluconate Cloth  6 each Topical Q0600   cholecalciferol  1,000 Units Oral Daily   clopidogrel  75 mg Oral Q breakfast   ezetimibe  10 mg Oral QHS   feeding supplement (ENSURE ENLIVE)  237 mL Oral Daily    heparin injection (subcutaneous)  5,000 Units Subcutaneous Q8H   insulin aspart  0-15 Units Subcutaneous TID WC   insulin aspart  3 Units Subcutaneous TID WC   insulin detemir  5 Units Subcutaneous BID   Ipratropium-Albuterol  1 puff Inhalation QID   levothyroxine  50 mcg Oral Q0600   methylPREDNISolone (SOLU-MEDROL) injection  40 mg Intravenous BID   pantoprazole  40 mg Oral Q1200   simvastatin  40 mg Oral QHS   sodium chloride flush  10-40 mL Intracatheter Q12H   venlafaxine XR  150  mg Oral QHS   vitamin B-12  1,000 mcg Oral Daily   zinc sulfate  220 mg Oral Daily    Continuous Infusions:  cefTRIAXone (ROCEPHIN)  IV Stopped (01/04/2020 1853)    PRN Meds: acetaminophen, alum & mag hydroxide-simeth, guaiFENesin-dextromethorphan, nitroGLYCERIN, ondansetron (ZOFRAN) IV, sodium chloride flush, traMADol  Physical Exam Vitals and nursing note reviewed.  Constitutional:      General: She is in acute distress.     Appearance: She is ill-appearing.  Pulmonary:     Effort: Respiratory distress present.  Skin:    Coloration: Skin is pale.  Neurological:     Mental Status: She is disoriented.     Comments: Lethargic, weak             Vital Signs: BP 124/82 (BP Location: Left Arm)    Pulse (!) 101    Temp 97.9 F (36.6 C) (Axillary)    Resp (!) 22    Ht 5\' 2"  (1.575 m)    Wt 65.5 kg    SpO2 94%    BMI 26.41 kg/m  SpO2: SpO2: 94 % O2 Device: O2 Device: Nasal Cannula O2 Flow Rate: O2 Flow Rate (L/min): 5 L/min  Intake/output summary:   Intake/Output Summary (Last 24 hours) at 12/24/2019 1310 Last data filed at 12/24/2019 0356 Gross per 24 hour  Intake 180 ml  Output 1030 ml  Net -850 ml   LBM: Last BM Date: 12/22/19 Baseline Weight: Weight: 63.5 kg Most recent weight: Weight: 65.5 kg       Palliative Assessment/Data:  PPS: 20%     Patient Active Problem List   Diagnosis Date Noted   Acute on chronic congestive heart failure (HCC)    Advanced care  planning/counseling discussion    Palliative care by specialist    Ischemic chest pain (HCC) 12/14/2019   COVID-19 virus infection 12/14/2019   Chest pain 12/14/2019   MRSA (methicillin resistant Staphylococcus aureus) infection 10/31/2019   Diabetic infection of right foot (HCC) 10/29/2019   Uncontrolled diabetes mellitus type 2 with peripheral artery disease (HCC) 10/29/2019   CAP (community acquired pneumonia) 02/07/2018   Ischemic cardiomyopathy 10/05/2017   Chronic systolic CHF (congestive heart failure) (HCC) 09/03/2017   Orthostatic hypotension 09/03/2017   Pleural effusion    Status post coronary artery stent placement    Status post thoracentesis    NSTEMI (non-ST elevated myocardial infarction) (HCC)    NSVT (nonsustained ventricular tachycardia) (HCC) 02/08/2017   SOB (shortness of breath)    Hypertensive heart disease without heart failure    CAD- CABG X 4 10/08 12/17/2012   Diabetes (HCC) 12/17/2012   Essential hypertension 12/17/2012   PAF (paroxysmal atrial fibrillation) (HCC) 12/17/2012   Dyslipidemia 12/17/2012   Hypothyroid 12/17/2012   Depression 12/17/2012    Palliative Care Assessment & Plan   Patient Profile: 74 y.o. female  with past medical history of systolic heart failure with AICD, a fib, DM2, CAD, HTN admitted on 12/14/2019 with chest pain, SOB, lower extremity edema consistent with CHF exacerbation, COVID positive finding (she did have one dose of vaccine). Left and Right heart cath on 8/13. Admission further complicated by kidney injury- plan for dialysis. Respiratory status deteriorating. Palliative medicine consulted for goals of care.    Assessment/Recommendations/Plan   Family would not want patient to undergo CPR or put on ventilator- will enter DNR order  9/13 requests call from attending provider for update on patient status- will ask Dr. Leonette Most to call- family is considering transition to  comfort measures based on  discussion with attending   Goals of Care and Additional Recommendations:  Limitations on Scope of Treatment: Full Scope Treatment  Code Status:  DNR  Prognosis:   Unable to determine  Discharge Planning:  To Be Determined  Care plan was discussed with patient's son in law and her care team.   Thank you for allowing the Palliative Medicine Team to assist in the care of this patient.   Time In: 1200 Time Out: 1235 Total Time 35 mins Prolonged Time Billed no      Greater than 50%  of this time was spent counseling and coordinating care related to the above assessment and plan.  Ocie Bob, AGNP-C Palliative Medicine   Please contact Palliative Medicine Team phone at [phone] for questions and concerns.

## 2019-12-24 NOTE — Progress Notes (Signed)
Upon my assessment, pt's very lethargic, not interactive, unable to tell me her name/ place/ etc, unable to tolerate PO food/ medicine.  RN made attending and Palliative team aware. Will continue to monitor closely.

## 2019-12-24 NOTE — Progress Notes (Signed)
Pt's transported to HD.

## 2019-12-24 NOTE — Progress Notes (Signed)
IR.  Patient underwent an image-guided nontunneled right IJ HD catheter placement in IR 12/11/2019 by Dr. Loreta Ave. Received call from T, RN stating that patient was bleeding from catheter site post-dialysis. Instructed T to hold pressure until I arrived. Of note, patient did receive Heparin 5000U SQ at 0621.  Went to 6N to evaluate patient. Upon my arrival, no staff in room. Dressing unchanged from placement yesterday (clear tegaderm with biopatch), with visible clot under clear dressing. Dressing was removed along with clot with minimal oozing and palpable approximately quarter size hematoma posterior to insertion site. Manual pressure was held for approximately 10 minutes with no signs of active bleeding following this, hematoma was pushed out to best of my ability. New biopatch and tegaderm applied to site, again no signs of active bleeding. Patient left at 45 degree angle to achieve hemostasis. Discussed above with T, RN- advised her to monitor site, hold pressure if active bleeding noted. Will make TRH aware.  Please call IR with questions/concerns.   Waylan Boga Regginald Pask, PA-C 12/24/2019, 8:58 AM

## 2019-12-24 NOTE — Progress Notes (Signed)
  ADVANCED HF ROUNDING TEAM  Chart reviewed.   Continues with progressive renal failure/uremia/volume overload. Plan for HD tonight.   Has been seen by Palliative Care and notified about her husband's death.   There is little we can add at this point. We will continue to follow.   Arvilla Meres, MD  10:12 PM

## 2019-12-24 NOTE — Progress Notes (Signed)
Noted that pt's still bleeding/ oozing blood from her HD cath. RN held pressure for 10 min and put a new pressure dressing on at 1345. Pt still not eating/ drinking. RN updated attending and palliative team.

## 2019-12-24 NOTE — Progress Notes (Signed)
PT Cancellation Note  Patient Details Name: [name] MRN: [id number] DOB: 09-28-1945   Cancelled Treatment:    Reason Eval/Treat Not Completed: (P) Patient not medically ready Pt has been minimally responsive all day and has just been told that her husband has passed away. Family has agreed to HD tonight to see if there is a change in her status and if not may be pursing comfort measures. RN requests PT follow back tomorrow to assess appropriateness of pt Evaluation. PT will follow back tomorrow.  Lilya Smitherman B. Beverely Risen PT, DPT Acute Rehabilitation Services Pager [phone] Office [phone]    Elon Alas Fleet 12/24/2019, 5:06 PM

## 2019-12-24 NOTE — Progress Notes (Addendum)
Additional face to face and non face to face Palliative encounter:   I facilitated a video call between patient and her daughter and son in law. They informed patient of her husband's passing. Goals of care discussion with patient was attempted. Patient was a little more oriented to her situation- but I am not convinced she clearly understood. By my assessment she did not have capacity for decision making in the moment.  I spoke with Leonette Most and Cindi afterward again by phone outside of the room.  For now the plan will be to continue current level of care. Proceed with dialysis tonight. Will re-evaluate her status in the morning. Should she decompensate sooner, recommend immediate discussion with family and bringing them in as soon as possible for in person visit.  They do ask for some measure of comfort for patient as she appears in distress with her breathing. Start Oxycodone liquid 5mg  q4hr prn.   Very difficult situation for patient and family given the recent death of their husband and father.   , AGNP-C Palliative Medicine  Please call Palliative Medicine team phone with any questions [phone]. For individual providers please see AMION.  Total additional time: 60 minutes

## 2019-12-24 NOTE — Progress Notes (Signed)
Nephrology Follow-Up Consult note   Assessment/Recommendations: [name] is a/an 74 y.o. female with a past medical history systolic heart failure status post AICD, atrial fibrillation, DM 2  who present w/ NSTEMI, Covid, now with AKI on CKD  Severe anuric AKI:  Baseline creatinine 1.3.  Rising creatinine since 8/13 with decreasing urine output and associated electrolyte abnormalities.  Concern for CIN but C3 is low at 65 opening up possible cholesterol embolization.  If this is the case creatinine may not improve back to baseline or at all.  Tolerated dialysis with trying to clarify overall goals of care given her poor prognosis -Continue dialysis today with ultrafiltration as tolerated -Continue to monitor urine output for recovery: Given the concern for possible cholesterol embolization recovery is now more questionable -Continue goals of care conversations with the family, appreciate palliative care -Continue to monitor daily Cr, Dose meds for GFR -Monitor Daily I/Os, Daily weight  -Maintain MAP>65 for optimal renal perfusion.  -Avoid nephrotoxic medications including NSAIDs and Vanc/Zosyn combo   Volume Status: Appears volume overloaded with hypoxia.  Ultrafiltration with dialysis as above  HFrEF/CAD: Complicated history with decreased ejection fraction of 15 to 20% and history of CABG as well as PCI this admission.    Plan for dialysis as above.  Heart failure team following.  Appreciate help.  Atrial fibrillation: Rate controlled on amiodarone.  Holding oral anticoagulation  Uncontrolled Diabetes Mellitus Type 2 with Hyperglycemia: Exacerbated by steroids.  Defer management to primary team.  Covid: Partially vaccinated.  Disease seem to be relatively minimal.  Also treating for possible pneumonia.  Antibiotics and treatment of Covid per primary team.   Recommendations conveyed to primary service.    Darnell Level [facility] 12/24/2019 12:57  PM  ___________________________________________________________  CC: AKI on CKD  Interval History/Subjective: Patient has been more lethargic over the past 24 hours.  Unable to answer many questions today.  Urine output is minimal.  Underwent dialysis yesterday with 1 L UF.  Palliative care has become involved.   Medications:  Current Facility-Administered Medications  Medication Dose Route Frequency Provider Last Rate Last Admin  . acetaminophen (TYLENOL) tablet 650 mg  650 mg Oral Q6H PRN Marykay Lex, MD   650 mg at 12/18/19 2210  . alum & mag hydroxide-simeth (MAALOX/MYLANTA) 200-200-20 MG/5ML suspension 30 mL  30 mL Oral Q6H PRN Rolly Salter, MD   30 mL at 12/20/19 1906  . amiodarone (PACERONE) tablet 100 mg  100 mg Oral Daily Marykay Lex, MD   100 mg at 01/06/2020 0913  . ascorbic acid (VITAMIN C) tablet 500 mg  500 mg Oral Daily Marykay Lex, MD   500 mg at 12/17/2019 0914  . aspirin chewable tablet 81 mg  81 mg Oral Daily Marykay Lex, MD   81 mg at 12/24/2019 0914  . azithromycin (ZITHROMAX) tablet 500 mg  500 mg Oral Daily Rolly Salter, MD   500 mg at 12/30/2019 1012  . cefTRIAXone (ROCEPHIN) 2 g in sodium chloride 0.9 % 100 mL IVPB  2 g Intravenous Q24H Rolly Salter, MD   Stopped at 12/11/2019 1853  . Chlorhexidine Gluconate Cloth 2 % PADS 6 each  6 each Topical Daily Rolly Salter, MD   6 each at 12/22/2019 1100  . Chlorhexidine Gluconate Cloth 2 % PADS 6 each  6 each Topical Q0600 Darnell Level, MD      . cholecalciferol (VITAMIN D3) tablet 1,000 Units  1,000  Units Oral Daily Marykay Lex, MD   1,000 Units at 01/06/2020 0914  . clopidogrel (PLAVIX) tablet 75 mg  75 mg Oral Q breakfast Bensimhon, Bevelyn Buckles, MD   75 mg at 12/17/2019 0913  . ezetimibe (ZETIA) tablet 10 mg  10 mg Oral QHS Marykay Lex, MD   10 mg at 01/03/2020 2118  . feeding supplement (ENSURE ENLIVE) (ENSURE ENLIVE) liquid 237 mL  237 mL Oral Daily Marykay Lex, MD   237 mL at 12/22/19 1152   . guaiFENesin-dextromethorphan (ROBITUSSIN DM) 100-10 MG/5ML syrup 10 mL  10 mL Oral Q4H PRN Marykay Lex, MD      . heparin injection 5,000 Units  5,000 Units Subcutaneous Q8H Rolly Salter, MD   5,000 Units at 12/24/19 [phone]  . insulin aspart (novoLOG) injection 0-15 Units  0-15 Units Subcutaneous TID WC Marykay Lex, MD   3 Units at 12/29/2019 1158  . insulin aspart (novoLOG) injection 3 Units  3 Units Subcutaneous TID WC Rolly Salter, MD   3 Units at 12/22/19 1715  . insulin detemir (LEVEMIR) injection 5 Units  5 Units Subcutaneous BID Rolly Salter, MD   5 Units at 12/24/19 0919  . Ipratropium-Albuterol (COMBIVENT) respimat 1 puff  1 puff Inhalation QID Rolly Salter, MD   1 puff at 12/22/2019 2117  . levothyroxine (SYNTHROID) tablet 50 mcg  50 mcg Oral Q0600 Marykay Lex, MD   50 mcg at 12/22/2019 0528  . methylPREDNISolone sodium succinate (SOLU-MEDROL) 40 mg/mL injection 40 mg  40 mg Intravenous BID Rolly Salter, MD   40 mg at 12/24/19 0920  . nitroGLYCERIN (NITROSTAT) SL tablet 0.4 mg  0.4 mg Sublingual Q5 min PRN Marykay Lex, MD      . ondansetron [facility]) injection 4 mg  4 mg Intravenous Q6H PRN Marykay Lex, MD      . pantoprazole (PROTONIX) EC tablet 40 mg  40 mg Oral Q1200 Marykay Lex, MD   40 mg at 12/29/2019 1157  . simvastatin (ZOCOR) tablet 40 mg  40 mg Oral QHS Marykay Lex, MD   40 mg at 12/07/2019 2118  . sodium chloride flush (NS) 0.9 % injection 10-40 mL  10-40 mL Intracatheter Q12H Rolly Salter, MD   10 mL at 12/31/2019 2120  . sodium chloride flush (NS) 0.9 % injection 10-40 mL  10-40 mL Intracatheter PRN Rolly Salter, MD   10 mL at 01/06/2020 0529  . traMADol (ULTRAM) tablet 50 mg  50 mg Oral Q6H PRN Marykay Lex, MD   50 mg at 12/24/19 0224  . venlafaxine XR (EFFEXOR-XR) 24 hr capsule 150 mg  150 mg Oral QHS Marykay Lex, MD   150 mg at 12/15/2019 2119  . vitamin B-12 (CYANOCOBALAMIN) tablet 1,000 mcg  1,000 mcg Oral Daily Marykay Lex, MD   1,000 mcg at 12/10/2019 0913  . zinc sulfate capsule 220 mg  220 mg Oral Daily Marykay Lex, MD   220 mg at 12/18/2019 0913      Review of Systems: Unable to obtain ROS due to the patient's AMS  Physical Exam: Vitals:   12/24/19 0918 12/24/19 1153  BP:  124/82  Pulse:  (!) 101  Resp:  (!) 22  Temp:  97.9 F (36.6 C)  SpO2: 93% 94%   No intake/output data recorded.  Intake/Output Summary (Last 24 hours) at 12/24/2019 1257 Last data filed at 12/24/2019 0356 Gross per  24 hour  Intake 180 ml  Output 1030 ml  Net -850 ml   General: Chronically ill-appearing, lying in bed, no distress HEENT: anicteric sclera, oropharynx clear without lesions CV: Normal rate, bilateral lower extremity edema Lungs: Mild increased work of breathing, bilateral chest rise Abd: soft, nondistended, bowel sounds present Skin: no visible lesions or rashes Psych: Lethargic, unable to answer most questions Musculoskeletal: no obvious deformities   Test Results I personally reviewed new and old clinical labs and radiology tests Lab Results  Component Value Date   NA 137 12/24/2019   K 4.4 12/24/2019   CL 98 12/24/2019   CO2 24 12/24/2019   BUN 56 (H) 12/24/2019   CREATININE 3.81 (H) 12/24/2019   CALCIUM 8.2 (L) 12/24/2019   ALBUMIN 2.6 (L) 12/24/2019   PHOS 4.9 (H) 12/24/2019

## 2019-12-25 DIAGNOSIS — I5022 Chronic systolic (congestive) heart failure: Secondary | ICD-10-CM

## 2019-12-25 LAB — COMPREHENSIVE METABOLIC PANEL
ALT: 13 U/L (ref 0–44)
AST: 22 U/L (ref 15–41)
Albumin: 2.6 g/dL — ABNORMAL LOW (ref 3.5–5.0)
Alkaline Phosphatase: 58 U/L (ref 38–126)
Anion gap: 11 (ref 5–15)
BUN: 40 mg/dL — ABNORMAL HIGH (ref 8–23)
CO2: 27 mmol/L (ref 22–32)
Calcium: 8.3 mg/dL — ABNORMAL LOW (ref 8.9–10.3)
Chloride: 99 mmol/L (ref 98–111)
Creatinine, Ser: 3.34 mg/dL — ABNORMAL HIGH (ref 0.44–1.00)
GFR calc Af Amer: 15 mL/min — ABNORMAL LOW (ref >60)
GFR calc non Af Amer: 13 mL/min — ABNORMAL LOW (ref >60)
Glucose, Bld: 126 mg/dL — ABNORMAL HIGH (ref 70–99)
Potassium: 4.2 mmol/L (ref 3.5–5.1)
Sodium: 137 mmol/L (ref 135–145)
Total Bilirubin: 0.9 mg/dL (ref 0.3–1.2)
Total Protein: 6 g/dL — ABNORMAL LOW (ref 6.5–8.1)

## 2019-12-25 LAB — COOXEMETRY PANEL
Carboxyhemoglobin: 0.8 % (ref 0.5–1.5)
Methemoglobin: 0.6 % (ref 0.0–1.5)
O2 Saturation: 61.9 %
Total hemoglobin: 11.4 g/dL — ABNORMAL LOW (ref 12.0–16.0)

## 2019-12-25 LAB — C-REACTIVE PROTEIN: CRP: 2.2 mg/dL — ABNORMAL HIGH (ref <1.0)

## 2019-12-25 LAB — CBC WITH DIFFERENTIAL/PLATELET
Abs Immature Granulocytes: 0.13 10*3/uL — ABNORMAL HIGH (ref 0.00–0.07)
Basophils Absolute: 0 10*3/uL (ref 0.0–0.1)
Basophils Relative: 0 %
Eosinophils Absolute: 0 10*3/uL (ref 0.0–0.5)
Eosinophils Relative: 0 %
HCT: 36.2 % (ref 36.0–46.0)
Hemoglobin: 11.1 g/dL — ABNORMAL LOW (ref 12.0–15.0)
Immature Granulocytes: 1 %
Lymphocytes Relative: 2 %
Lymphs Abs: 0.2 10*3/uL — ABNORMAL LOW (ref 0.7–4.0)
MCH: 26.7 pg (ref 26.0–34.0)
MCHC: 30.7 g/dL (ref 30.0–36.0)
MCV: 87.2 fL (ref 80.0–100.0)
Monocytes Absolute: 1 10*3/uL (ref 0.1–1.0)
Monocytes Relative: 9 %
Neutro Abs: 9.4 10*3/uL — ABNORMAL HIGH (ref 1.7–7.7)
Neutrophils Relative %: 88 %
Platelets: 156 10*3/uL (ref 150–400)
RBC: 4.15 MIL/uL (ref 3.87–5.11)
RDW: 16.2 % — ABNORMAL HIGH (ref 11.5–15.5)
WBC: 10.7 10*3/uL — ABNORMAL HIGH (ref 4.0–10.5)
nRBC: 0 % (ref 0.0–0.2)

## 2019-12-25 LAB — GLUCOSE, CAPILLARY
Glucose-Capillary: 129 mg/dL — ABNORMAL HIGH (ref 70–99)
Glucose-Capillary: 104 mg/dL — ABNORMAL HIGH (ref 70–99)

## 2019-12-25 LAB — HEPATITIS B SURFACE ANTIBODY, QUANTITATIVE: Hep B S AB Quant (Post): <3.1 m[IU]/mL — ABNORMAL LOW (ref >9.9)

## 2019-12-25 MED ORDER — ASPIRIN 81 MG PO CHEW: 81.0000 mg | CHEWABLE_TABLET | Freq: Every day | ORAL | 0 refills | Status: AC

## 2019-12-25 MED ORDER — OXYCODONE HCL 5 MG/5ML PO SOLN
5.0000 mg | ORAL | 0 refills | Status: AC | PRN
Start: 2019-12-25 — End: 2019-12-30

## 2019-12-25 MED ORDER — CLOPIDOGREL BISULFATE 75 MG PO TABS: 75.0000 mg | ORAL_TABLET | Freq: Every day | ORAL | 0 refills | Status: AC

## 2019-12-25 MED ORDER — LORAZEPAM 1 MG PO TABS
1.0000 mg | ORAL_TABLET | ORAL | Status: DC | PRN
  Filled 2019-12-25: qty 1

## 2019-12-25 MED ORDER — TORSEMIDE 20 MG PO TABS: 40.0000 mg | ORAL_TABLET | Freq: Every day | ORAL | 0 refills | Status: AC

## 2019-12-25 MED ORDER — LORAZEPAM 1 MG PO TABS: 1.0000 mg | ORAL_TABLET | ORAL | 0 refills | Status: AC | PRN

## 2019-12-25 NOTE — Progress Notes (Deleted)
Patients CDiff PCR came back negative. Talked to ID and got orders to discontinue enteric precautions.

## 2019-12-25 NOTE — Progress Notes (Signed)
Daily Progress Note   Patient Name: [name]       Date: 12/25/2019 DOB: 25-Aug-1945  Age: 74 y.o. MRN#: [id number] Attending Physician: Dorcas Carrow, MD Primary Care Physician: Georgianne Fick, MD Admit Date: 12/14/2019  Reason for Consultation/Follow-up: Establishing goals of care  Subjective:  Visited with patient. She is awake, but remains confused. [name] asked me if she was dead or alive. She was concerned that her daughter had died. I called her daughter and [name] was relieved to speak with her.  Spoke with [place] separately. Relayed impression from heart failure team that they have reached maximum input of therapy, have been able to improve [name]'s state minimally with HD but unfortunately, she remains to have poor likelihood for overall improvement to prior functioning. Options for comfort and discharge home with hospice discussed. [name] would like to get her mother home with hospice as soon as possible.   Review of Systems  Unable to perform ROS: Mental status change    Length of Stay: 10  Current Medications: Scheduled Meds:  . amiodarone  100 mg Oral Daily  . vitamin C  500 mg Oral Daily  . aspirin  81 mg Oral Daily  . azithromycin  500 mg Oral Daily  . Chlorhexidine Gluconate Cloth  6 each Topical Daily  . Chlorhexidine Gluconate Cloth  6 each Topical Q0600  . cholecalciferol  1,000 Units Oral Daily  . clopidogrel  75 mg Oral Q breakfast  . ezetimibe  10 mg Oral QHS  . feeding supplement (ENSURE ENLIVE)  237 mL Oral Daily  . insulin aspart  0-15 Units Subcutaneous TID WC  . insulin aspart  3 Units Subcutaneous TID WC  . insulin detemir  5 Units Subcutaneous BID  . Ipratropium-Albuterol  1 puff Inhalation QID  . levothyroxine  50 mcg Oral Q0600  .  methylPREDNISolone (SOLU-MEDROL) injection  40 mg Intravenous Daily  . pantoprazole  40 mg Oral Q1200  . simvastatin  40 mg Oral QHS  . sodium chloride flush  10-40 mL Intracatheter Q12H  . venlafaxine XR  150 mg Oral QHS  . vitamin B-12  1,000 mcg Oral Daily  . zinc sulfate  220 mg Oral Daily    Continuous Infusions: . cefTRIAXone (ROCEPHIN)  IV 2 g (12/24/19 2213)    PRN Meds: acetaminophen, alum &  mag hydroxide-simeth, guaiFENesin-dextromethorphan, nitroGLYCERIN, ondansetron (ZOFRAN) IV, oxyCODONE, sodium chloride flush, traMADol  Physical Exam Vitals and nursing note reviewed.  Constitutional:      Appearance: She is ill-appearing.  Cardiovascular:     Rate and Rhythm: Tachycardia present.  Pulmonary:     Comments: Increased effort Musculoskeletal:     Comments: Generalized weakness  Skin:    Coloration: Skin is pale.  Neurological:     Mental Status: She is alert.             Vital Signs: BP 128/83 (BP Location: Left Arm)   Pulse 100   Temp 97.6 F (36.4 C) (Oral)   Resp 18   Ht 5\' 2"  (1.575 m)   Wt 64.5 kg   SpO2 93%   BMI 26.01 kg/m  SpO2: SpO2: 93 % O2 Device: O2 Device: Nasal Cannula O2 Flow Rate: O2 Flow Rate (L/min): 5 L/min  Intake/output summary:   Intake/Output Summary (Last 24 hours) at 12/25/2019 1135 Last data filed at 12/24/2019 2020 Gross per 24 hour  Intake 20 ml  Output 1500 ml  Net -1480 ml   LBM: Last BM Date: 12/22/19 Baseline Weight: Weight: 63.5 kg Most recent weight: Weight: 64.5 kg          Patient Active Problem List   Diagnosis Date Noted  . Acute on chronic congestive heart failure (HCC)   . Advanced care planning/counseling discussion   . Palliative care by specialist   . Interstitial pneumonia of both lungs (HCC)   . Goals of care, counseling/discussion   . AKI (acute kidney injury) (HCC)   . Ischemic chest pain (HCC) 12/14/2019  . COVID-19 virus infection 12/14/2019  . Chest pain 12/14/2019  . MRSA (methicillin  resistant Staphylococcus aureus) infection 10/31/2019  . Diabetic infection of right foot (HCC) 10/29/2019  . Uncontrolled diabetes mellitus type 2 with peripheral artery disease (HCC) 10/29/2019  . CAP (community acquired pneumonia) 02/07/2018  . Ischemic cardiomyopathy 10/05/2017  . Chronic systolic CHF (congestive heart failure) (HCC) 09/03/2017  . Orthostatic hypotension 09/03/2017  . Pleural effusion   . Status post coronary artery stent placement   . Status post thoracentesis   . NSTEMI (non-ST elevated myocardial infarction) (HCC)   . NSVT (nonsustained ventricular tachycardia) (HCC) 02/08/2017  . SOB (shortness of breath)   . Hypertensive heart disease without heart failure   . 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 Diabetes (HCC) 12/17/2012  . Essential hypertension 12/17/2012  . PAF (paroxysmal atrial fibrillation) (HCC) 12/17/2012  . Dyslipidemia 12/17/2012  . Hypothyroid 12/17/2012  . Depression 12/17/2012    Palliative Care Assessment & Plan   Patient Profile: 74 y.o.femalewith past medical history of systolic heart failure with AICD, a fib, DM2, CAD, HTNadmitted on8/8/2021with chest pain, SOB, lower extremity edemaconsistent with CHF exacerbation, COVID positive finding (she did have one dose of vaccine). Left and Right heart cath on 8/13. Admission further complicated bykidney injury- plan for dialysis. Respiratory status deteriorating. Palliative medicine consulted for goals of care.   Assessment/Recommendations/Plan   Plan for transition to comfort and discharge home with Hospice as soon as possible  TOC referral for assistance in referring to Hospice- family expresses they already have a hospital bed in place  Comfort medications as ordered   Goals of Care and Additional Recommendations:  Limitations on Scope of Treatment: Full Comfort Care  Code Status:  DNR  Prognosis:   < 2 weeks  Discharge Planning:  Home with Hospice  Care plan was  discussed with patient's daughter and son in law.   Thank you for allowing the Palliative Medicine Team to assist in the care of this patient.   Time In: 1000 Time Out: 1100 Total Time 60 minutes Prolonged Time Billed yes      Greater than 50%  of this time was spent counseling and coordinating care related to the above assessment and plan.  Ocie Bob, AGNP-C Palliative Medicine   Please contact Palliative Medicine Team phone at [phone] for questions and concerns.

## 2019-12-25 NOTE — Progress Notes (Signed)
PROGRESS NOTE    VASHON ARCH  PYY:[id number] DOB: [birth year] DOA: 12/14/2019 PCP: Merrilee Seashore, MD    Brief Narrative:  Patient with past medical history of chronic systolic CHF, SP AICD implant, PAF on Eliquis, type II DM, partially vaccinated. Presented with shortness of breath and swelling in the leg with chest pain, found to have acute decompensated systolic CHF. Tested positive for Covid. Started having fever on 8/11//2021.  Patient was treated with remdesivir also treated with antibiotics for bacterial pneumonia.  In the meantime, patient was also treated with Primacor and IV diuresis.  Underwent coronary artery catheterization with stent placement.  Patient was on therapeutic heparin which was discontinued due to hematuria which currently resolved.  Patient subsequently developed acute renal failure with uremia, anuria needing hemodialysis. Followed by nephrology, cardiology, palliative medicine and advanced heart failure team. Patient had temporary right IJ dialysis catheter placed and started on hemodialysis on 8/17. Hospital course complicated with persistent lethargic, failure to thrive, poor appetite and multiple medical complicating issues with poor chances of recovery.  8/19, discussed with family and desire to start comfort care, hospice level of care.   Assessment & Plan:   Active Problems:   Diabetes ([place])   Essential hypertension   PAF (paroxysmal atrial fibrillation) (HCC)   Hypothyroid   Chronic systolic CHF (congestive heart failure) (HCC)   Ischemic chest pain ([place])   COVID-19 virus infection   Chest pain   Acute on chronic congestive heart failure ([place])   Advanced care planning/counseling discussion   Palliative care by specialist   Interstitial pneumonia of both lungs ([place])   Goals of care, counseling/discussion   AKI (acute kidney injury) ([place])  Acute hypoxemic respiratory failure: Multifactorial.  COVID-19 pneumonia, bacterial pneumonia, acute  systolic heart failure: Mostly from acute systolic heart failure component.  Ejection fraction 15%.  COVID-19 viral infection with pneumonia: Patient was treated with IV steroids for last 11 days, will discontinue now.  She received 5 days of remdesivir therapy.  Overall with poor recovery.  Remains on 4 to 5 L of oxygen.  She has poor participation, however we will continue to work on chest physiotherapy as much as possible.  SpO2: 94 % O2 Flow Rate (L/min): 5 L/min  COVID-19 Labs  Recent Labs    12/23/19 0542 12/24/19 0500 12/25/19 0500  CRP 2.6* 3.1* 2.2*    Lab Results  Component Value Date   SARSCOV2NAA POSITIVE (A) 12/14/2019   [place] NEGATIVE 10/29/2019   Acute on chronic combined systolic and diastolic congestive heart failure/[employer] ICD/chest pain with angina: Ejection fraction 15 to 20%. Was on milrinone, stopped now. Followed by heart failure team.  Now desiring home hospice. Losartan and spironolactone on hold due to AKI. Status post PCI RCA 8/13 Aspirin, Plavix, Zetia.  Statin intolerance. We will continue aspirin, Plavix and torsemide 40 mg on discharge to protect his stent and as well as to make her comfortable with volume removal.  Acute metabolic encephalopathy: still confused.  Acute kidney injury on chronic kidney disease stage IIIa: Baseline creatinine about 1.3.  Significant volume overload.  Started on hemodialysis. Received temporary dialysis catheter and first hemodialysis on 8/17. Subsequent dialysis on 8/18.  No much improvement in clinical status.  Paroxysmal A. fib: Rate controlled in sinus rhythm currently on amiodarone.  Holding anticoagulation due to hematuria that has resolved now.  Uncontrolled type 2 diabetes with hyperglycemia: On insulin.  Stable.  Acute UTI: On Rocephin.  Hypothyroidism: On Synthroid.  Leukopenia/acute thrombocytopenia: Due to  acute infection.  Stable.  DVT prophylaxis: On heparin.  discontinue as she has  ongoing bleeding from the dialysis access.  Goal of care discussion: Multiple discussion by palliative team and our team with family.  Unfortunately, her husband also died at home on 14-Jan-2023 with home hospice.  She is also not doing well.  She is lethargic, not eating well, failure to thrive and not responding to treatment.  We discussed about different options including palliative and hospice. Appreciate palliative team involvement to facilitate communication and goal of care.  8/19: Home hospice arrangements to be made.  Discharge home with hospice.  Will discontinue IJ line.   Code Status: DNR Family Communication: Son-in-law on the phone with palliative care. Disposition Plan: Status is: Inpatient  Remains inpatient appropriate because:IV treatments appropriate due to intensity of illness or inability to take PO   Dispo: The patient is from: Home              Anticipated d/c is to: Home with home hospice.              Anticipated d/c date is: Today or tomorrow after arrangements are made.              Patient currently is not medically stable to d/c.  Only able to go home with hospice.         Consultants:   Cardiology  Nephrology  Procedures:   PCI  Hemodialysis  Antimicrobials:  Antibiotics Given (last 72 hours)    Date/Time Action Medication Dose Rate   12/22/19 1721 New Bag/Given   cefTRIAXone (ROCEPHIN) 2 g in sodium chloride 0.9 % 100 mL IVPB 2 g 200 mL/hr   01/14/20 1012 Given   azithromycin (ZITHROMAX) tablet 500 mg 500 mg    2020-01-14 1823 New Bag/Given   cefTRIAXone (ROCEPHIN) 2 g in sodium chloride 0.9 % 100 mL IVPB 2 g 200 mL/hr   12/24/19 2213 New Bag/Given   cefTRIAXone (ROCEPHIN) 2 g in sodium chloride 0.9 % 100 mL IVPB 2 g 200 mL/hr         Subjective: Patient seen and examined.  No overnight events.  She was slightly awake today.  When I told her that her husband died, she did not have any reaction.  Patient tells me that she is okay.  She does  not know where she is.  Objective: Vitals:   12/25/19 0339 12/25/19 0800 12/25/19 0853 12/25/19 1149  BP: 128/83  122/79 122/76  Pulse: (!) 103 100 (!) 102 (!) 102  Resp: _0 Temp: 97.6 F (36.4 C)  (!) 97.3 F (36.3 C) (!) 97.5 F (36.4 C)  TempSrc: Oral  Oral Oral  SpO2: 94% 93% 94% 94%  Weight:      Height:        Intake/Output Summary (Last 24 hours) at 12/25/2019 1439 Last data filed at 12/24/2019 2020 Gross per 24 hour  Intake --  Output 1500 ml  Net -1500 ml   Filed Weights   12/24/19 0356 12/24/19 1710 12/24/19 2020  Weight: 65.5 kg 66 kg 64.5 kg    Examination:  General exam: Acutely and chronically sick looking lady, not in any distress, confused and lethargic. Respiratory system: Clear to auscultation. Respiratory effort normal.  Mostly conducted airway sounds. Cardiovascular system: S1 & S2 heard, RRR.  1+ bilateral pedal edema. Gastrointestinal system: Abdomen is nondistended, soft and nontender. No organomegaly or masses felt. Normal bowel sounds heard. Central nervous system: Alert  and oriented x1.  Lethargic. Right IJ line with temporary dialysis catheter, some trace blood beneath the dressing, no active wheezing.   Data Reviewed: I have personally reviewed following labs and imaging studies  CBC: Recent Labs  Lab 12/21/19 0644 12/22/19 0647 12/23/19 0542 12/24/19 0500 12/25/19 0500  WBC 5.1 8.9 10.3 11.2* 10.7*  NEUTROABS 4.5 8.2* 9.5* 10.2* 9.4*  HGB 10.7* 10.7* 11.0* 11.1* 11.1*  HCT 34.3* 34.7* 34.9* 35.8* 36.2  MCV 85.5 86.8 85.3 85.0 87.2  PLT 144* 155 150 145* 174   Basic Metabolic Panel: Recent Labs  Lab 12/22/19 0647 12/22/19 1814 12/23/19 0542 12/24/19 0500 12/25/19 0500  NA 132* 134* 133* 137 137  K 4.7 4.7 4.5 4.4 4.2  CL 93* 94* 94* 98 99  CO2 _0 GLUCOSE 305* 167* 131* 112* 126*  BUN 72* 78* 84* 56* 40*  CREATININE 4.25* 4.55* 4.68* 3.81* 3.34*  CALCIUM 7.7* 8.0* 7.9* 8.2* 8.3*  MG  --   --   --   2.5*  --   PHOS  --   --   --  4.9*  --    GFR: Estimated Creatinine Clearance: 12.8 mL/min (A) (by C-G formula based on SCr of 3.34 mg/dL (H)). Liver Function Tests: Recent Labs  Lab 12/19/19 0348 12/20/19 0631 12/24/19 0500 12/25/19 0500  AST 43* 41 24 22  ALT _1 ALKPHOS 66 56 63 58  BILITOT 0.8 0.8 0.9 0.9  PROT 6.2* 5.3* 6.0* 6.0*  ALBUMIN 2.8* 2.4* 2.6* 2.6*   No results for input(s): LIPASE, AMYLASE in the last 168 hours. No results for input(s): AMMONIA in the last 168 hours. Coagulation Profile: No results for input(s): INR, PROTIME in the last 168 hours. Cardiac Enzymes: No results for input(s): CKTOTAL, CKMB, CKMBINDEX, TROPONINI in the last 168 hours. BNP (last 3 results) No results for input(s): PROBNP in the last 8760 hours. HbA1C: No results for input(s): HGBA1C in the last 72 hours. CBG: Recent Labs  Lab 12/24/19 1151 12/24/19 1616 12/24/19 2210 12/25/19 0848 12/25/19 1145  GLUCAP 126* 137* 122* 129* 104*   Lipid Profile: No results for input(s): CHOL, HDL, LDLCALC, TRIG, CHOLHDL, LDLDIRECT in the last 72 hours. Thyroid Function Tests: No results for input(s): TSH, T4TOTAL, FREET4, T3FREE, THYROIDAB in the last 72 hours. Anemia Panel: No results for input(s): VITAMINB12, FOLATE, FERRITIN, TIBC, IRON, RETICCTPCT in the last 72 hours. Sepsis Labs: Recent Labs  Lab 12/20/19 1356 12/21/19 0644 12/22/19 0647  PROCALCITON 0.67 0.85 0.87    Recent Results (from the past 240 hour(s))  Culture, blood (routine x 2)     Status: None   Collection Time: 12/17/19  3:12 AM   Specimen: BLOOD RIGHT HAND  Result Value Ref Range Status   Specimen Description BLOOD RIGHT HAND  Final   Special Requests   Final    BOTTLES DRAWN AEROBIC AND ANAEROBIC Blood Culture adequate volume   Culture   Final    NO GROWTH 5 DAYS Performed at [facility], [place] [address], [place], [place] [address]    Report Status 12/22/2019 FINAL  Final  Culture, blood  (routine x 2)     Status: None   Collection Time: 12/17/19  3:12 AM   Specimen: BLOOD RIGHT ARM  Result Value Ref Range Status   Specimen Description BLOOD RIGHT ARM  Final   Special Requests   Final    BOTTLES DRAWN AEROBIC AND ANAEROBIC Blood Culture  results may not be optimal due to an inadequate volume of blood received in culture bottles   Culture   Final    NO GROWTH 5 DAYS Performed at [facility], [place] [address] [place], [place] [address]    Report Status 12/22/2019 FINAL  Final  Culture, Urine     Status: Abnormal   Collection Time: 12/17/19 10:49 AM   Specimen: Urine, Random  Result Value Ref Range Status   Specimen Description URINE, RANDOM  Final   Special Requests NONE  Final   Culture (A)  Final    <10,000 COLONIES/mL INSIGNIFICANT GROWTH Performed at [place] [address] [place], [place] [address]    Report Status 12/18/2019 FINAL  Final         Radiology Studies: IR Fluoro Guide CV Line Right  Result Date: 12/24/2019 INDICATION: 74 year old female referred for temporary hemodialysis catheter placement EXAM: IMAGE GUIDED TEMPORARY HEMODIALYSIS CATHETER MEDICATIONS: None ANESTHESIA/SEDATION: None FLUOROSCOPY TIME:  Fluoroscopy Time: 0 minutes 6 seconds (1 mGy). COMPLICATIONS: None PROCEDURE: Informed written consent was obtained from the patient's family after a discussion of the risks, benefits, and alternatives to treatment. Questions regarding the procedure were encouraged and answered. The right neck was prepped with chlorhexidine in a sterile fashion, and a sterile drape was applied covering the operative field. Maximum barrier sterile technique with sterile gowns and gloves were used for the procedure. A timeout was performed prior to the initiation of the procedure. A micropuncture kit was utilized to access the right internal jugular vein under direct, real-time ultrasound guidance after the overlying soft tissues were anesthetized with 1%  lidocaine with epinephrine. Ultrasound image documentation was performed. The microwire was kinked to measure appropriate catheter length. A stiff glidewire was advanced to the level of the IVC. A 16 cm hemodialysis catheter was then placed over the wire. Final catheter positioning was confirmed and documented with a spot radiographic image. The catheter aspirates and flushes normally. The catheter was flushed with appropriate volume heparin dwells. Dressings were applied. The patient tolerated the procedure well without immediate post procedural complication. IMPRESSION: Status post right IJ temporary hemodialysis catheter. Signed, Dulcy Fanny. Dellia Nims, RPVI [facility] Radiology Electronically Signed   By: Corrie Mckusick D.O.   On: 12/24/2019 08:13   IR US Guide Vasc Access Right  Result Date: 12/24/2019 INDICATION: 74 year old female referred for temporary hemodialysis catheter placement EXAM: IMAGE GUIDED TEMPORARY HEMODIALYSIS CATHETER MEDICATIONS: None ANESTHESIA/SEDATION: None FLUOROSCOPY TIME:  Fluoroscopy Time: 0 minutes 6 seconds (1 mGy). COMPLICATIONS: None PROCEDURE: Informed written consent was obtained from the patient's family after a discussion of the risks, benefits, and alternatives to treatment. Questions regarding the procedure were encouraged and answered. The right neck was prepped with chlorhexidine in a sterile fashion, and a sterile drape was applied covering the operative field. Maximum barrier sterile technique with sterile gowns and gloves were used for the procedure. A timeout was performed prior to the initiation of the procedure. A micropuncture kit was utilized to access the right internal jugular vein under direct, real-time ultrasound guidance after the overlying soft tissues were anesthetized with 1% lidocaine with epinephrine. Ultrasound image documentation was performed. The microwire was kinked to measure appropriate catheter  length. A stiff glidewire was advanced to the level of the IVC. A 16 cm hemodialysis catheter was then placed over the wire. Final catheter positioning was confirmed and documented with a spot radiographic image. The catheter aspirates and flushes normally.  The catheter was flushed with appropriate volume heparin dwells. Dressings were applied. The patient tolerated the procedure well without immediate post procedural complication. IMPRESSION: Status post right IJ temporary hemodialysis catheter. Signed, Dulcy Fanny. Dellia Nims, RPVI [facility] Radiology Electronically Signed   By: Corrie Mckusick D.O.   On: 12/24/2019 08:13   DG Chest Port 1 View  Result Date: 12/24/2019 CLINICAL DATA:  Respiratory distress.  COVID-19 pneumonia. EXAM: PORTABLE CHEST 1 VIEW COMPARISON:  12/19/2019 FINDINGS: Right internal jugular line tip at high right atrium. Right-sided PICC line tip is difficult to visualize. Pacer/AICD device. Prior median sternotomy. Normal heart size. No definite pleural fluid. No pneumothorax. Diffuse interstitial and airspace disease, slightly progressive in the left upper lung. IMPRESSION: Worsening diffuse interstitial and airspace disease which most likely represents COVID-19 pneumonia. A component of congestive heart failure could look similar. No pneumothorax or other acute complication. Electronically Signed   By: Abigail Miyamoto M.D.   On: 12/24/2019 08:18        Scheduled Meds: . amiodarone  100 mg Oral Daily  . vitamin C  500 mg Oral Daily  . aspirin  81 mg Oral Daily  . azithromycin  500 mg Oral Daily  . Chlorhexidine Gluconate Cloth  6 each Topical Daily  . Chlorhexidine Gluconate Cloth  6 each Topical Q0600  . cholecalciferol  1,000 Units Oral Daily  . clopidogrel  75 mg Oral Q breakfast  . ezetimibe  10 mg Oral QHS  . feeding supplement (ENSURE ENLIVE)  237 mL Oral Daily  . insulin aspart  3 Units Subcutaneous TID WC  . insulin  detemir  5 Units Subcutaneous BID  . Ipratropium-Albuterol  1 puff Inhalation QID  . levothyroxine  50 mcg Oral Q0600  . methylPREDNISolone (SOLU-MEDROL) injection  40 mg Intravenous Daily  . pantoprazole  40 mg Oral Q1200  . simvastatin  40 mg Oral QHS  . sodium chloride flush  10-40 mL Intracatheter Q12H  . venlafaxine XR  150 mg Oral QHS  . vitamin B-12  1,000 mcg Oral Daily  . zinc sulfate  220 mg Oral Daily   Continuous Infusions: . cefTRIAXone (ROCEPHIN)  IV 2 g (12/24/19 2213)     LOS: 10 days    Time spent: 35 minutes    Barb Merino, MD [facility] Pager [phone]

## 2019-12-25 NOTE — Progress Notes (Signed)
Patient came back from HD, HD cath still oozing blood. Placed pressure dressing over gauze dressing. Will continue to monitor patient closely.

## 2019-12-25 NOTE — Progress Notes (Signed)
OT Cancellation Note  Patient Details Name: [name] MRN: [id number] DOB: [birth year]   Cancelled Treatment:    Reason Eval/Treat Not Completed: Other (comment) Pt status has declined since original eval. Pt with tenuous medical status and per social work and case management is planning to go home with home hospice. OT will sign off, since comfort measures are being highly considered. Thank you for this consult.   Dalphine Handing, MSOT, OTR/L Acute Rehabilitation Services [facility] Office Number: [phone] Pager: [phone]  Dalphine Handing 12/25/2019, 11:40 AM

## 2019-12-25 NOTE — Progress Notes (Signed)
° °  Plans for d/c with Hospice noted.   On d/c would continue ASA/Plavix as long as possible in setting of recent coronary stent.  Would also send home on torsemide 40 daily to see if this will help with volume overload in any way.  The HF team will sign off.   Please call with questions.   Arvilla Meres, MD  2:35 PM

## 2019-12-25 NOTE — Progress Notes (Signed)
Civil engineer, contracting [facility])  Received request from [facility] for hospice services at home after discharge.  Chart and pt information under review by [facility] physician.  Hospice eligibility pending at this time.  Hospital liaison spoke with son-in-law Leonette Most to initiate education related to hospice philosophy and services and to answer any questions at this time. Sadly Leonette Most shared that he was very familiar with our services, because the pt's husband was under [facility]'s care when he died this week.  Per discussion the plan is to discharge home possibly tomorrow by [facility].  [place] send signed and completed DNR home with pt/family.  Please provide prescriptions at discharge as needed to ensure ongoing symptom management until patient can be admitted onto hospice services.    DME needs discussed.   Hospital bed, OBT, and supplemental O2 are needed.  Address has been verified and is correct in the chart.  [facility] information and contact numbers given to [facility].  Above information shared with Justin Mend Manager.  Please call with any questions or concerns.  Thank you for the opportunity to participate in this pt's care.  Gillian Scarce, BSN, RN [employer] [phone] (24h on call)

## 2019-12-25 NOTE — Progress Notes (Signed)
IV team into room to d/c PICC line catheter. Pt's daughter, [name], contacted regarding transport via [facility] this evening. Verbalized understanding. Verified that DME has been delivered. AVS instructions given via telephone to daughter. No questions or concerns expressed.

## 2019-12-25 NOTE — Progress Notes (Signed)
Nephrology Follow-Up Consult note   Assessment/Recommendations: [name] is a/an 74 y.o. female with a past medical history systolic heart failure status post AICD, atrial fibrillation, DM 2  who present w/ NSTEMI, Covid, now with AKI on CKD  Severe anuric AKI:  Baseline creatinine 1.3.  Rising creatinine since 8/13 with decreasing urine output and associated electrolyte abnormalities.  Concern for CIN but C3 is low at 65 opening up possible cholesterol embolization.  unfortunately she is not showing significant signs of improvement.  It is possible that she could have improvement with time but less likely. We will not continue dialysis at this time given the patient's goals of care to go home on hospice likely tomorrow.  If something changes we are happy to be involved in the future   Volume Status: Appears volume overloaded with hypoxia.  Ultrafiltration with dialysis as above  HFrEF/CAD: Complicated history with decreased ejection fraction of 15 to 20% and history of CABG as well as PCI this admission.    Underwent dialysis now moving towards hospice.  Uncontrolled Diabetes Mellitus Type 2 with Hyperglycemia: Exacerbated by steroids.  Defer management to primary team.  Covid: Partially vaccinated.    Complicated by pneumonia.  Primary team treating  We will sign off at this time.  Please contact us if needed  Recommendations conveyed to primary service.    Darnell Level [facility] 12/25/2019 11:58 AM  ___________________________________________________________  CC: AKI on CKD  Interval History/Subjective: Patient stable over the past 24 hours tolerated dialysis without significant issues.  The patient is more awake and alert today.  She states that she is feeling okay.  Primary team has been working with case management and sounds like the patient is leaving tomorrow for hospice.   Medications:  Current Facility-Administered Medications  Medication  Dose Route Frequency Provider Last Rate Last Admin  . acetaminophen (TYLENOL) tablet 650 mg  650 mg Oral Q6H PRN Marykay Lex, MD   650 mg at 12/18/19 2210  . alum & mag hydroxide-simeth (MAALOX/MYLANTA) 200-200-20 MG/5ML suspension 30 mL  30 mL Oral Q6H PRN Rolly Salter, MD   30 mL at 12/20/19 1906  . amiodarone (PACERONE) tablet 100 mg  100 mg Oral Daily Marykay Lex, MD   100 mg at 12/09/2019 0913  . ascorbic acid (VITAMIN C) tablet 500 mg  500 mg Oral Daily Marykay Lex, MD   500 mg at 01/06/2020 0914  . aspirin chewable tablet 81 mg  81 mg Oral Daily Marykay Lex, MD   81 mg at 12/12/2019 0914  . azithromycin (ZITHROMAX) tablet 500 mg  500 mg Oral Daily Rolly Salter, MD   500 mg at 12/24/2019 1012  . cefTRIAXone (ROCEPHIN) 2 g in sodium chloride 0.9 % 100 mL IVPB  2 g Intravenous Q24H Rolly Salter, MD 200 mL/hr at 12/24/19 2213 2 g at 12/24/19 2213  . Chlorhexidine Gluconate Cloth 2 % PADS 6 each  6 each Topical Daily Rolly Salter, MD   6 each at 01/01/2020 1100  . Chlorhexidine Gluconate Cloth 2 % PADS 6 each  6 each Topical Q0600 Darnell Level, MD   6 each at 12/25/19 0402  . cholecalciferol (VITAMIN D3) tablet 1,000 Units  1,000 Units Oral Daily Marykay Lex, MD   1,000 Units at 12/19/2019 0914  . clopidogrel (PLAVIX) tablet 75 mg  75 mg Oral Q breakfast Bensimhon, Bevelyn Buckles, MD   75 mg at 12/17/2019 0913  .  ezetimibe (ZETIA) tablet 10 mg  10 mg Oral QHS Marykay Lex, MD   10 mg at 12/24/19 2218  . feeding supplement (ENSURE ENLIVE) (ENSURE ENLIVE) liquid 237 mL  237 mL Oral Daily Marykay Lex, MD   237 mL at 12/22/19 1152  . guaiFENesin-dextromethorphan (ROBITUSSIN DM) 100-10 MG/5ML syrup 10 mL  10 mL Oral Q4H PRN Marykay Lex, MD      . insulin aspart (novoLOG) injection 0-15 Units  0-15 Units Subcutaneous TID WC Marykay Lex, MD   2 Units at 12/25/19 [phone]  . insulin aspart (novoLOG) injection 3 Units  3 Units Subcutaneous TID WC Rolly Salter, MD   3  Units at 12/22/19 1715  . insulin detemir (LEVEMIR) injection 5 Units  5 Units Subcutaneous BID Rolly Salter, MD   5 Units at 12/25/19 0855  . Ipratropium-Albuterol (COMBIVENT) respimat 1 puff  1 puff Inhalation QID Rolly Salter, MD   1 puff at 12/24/19 2000  . levothyroxine (SYNTHROID) tablet 50 mcg  50 mcg Oral Q0600 Marykay Lex, MD   50 mcg at 12/21/2019 0528  . methylPREDNISolone sodium succinate (SOLU-MEDROL) 40 mg/mL injection 40 mg  40 mg Intravenous Daily Dorcas Carrow, MD   40 mg at 12/25/19 0851  . nitroGLYCERIN (NITROSTAT) SL tablet 0.4 mg  0.4 mg Sublingual Q5 min PRN Marykay Lex, MD      . ondansetron [employer]) injection 4 mg  4 mg Intravenous Q6H PRN Marykay Lex, MD      . oxyCODONE (ROXICODONE) 5 MG/5ML solution 5 mg  5 mg Oral Q4H PRN Barbara Cower, NP   5 mg at 12/25/19 0401  . pantoprazole (PROTONIX) EC tablet 40 mg  40 mg Oral Q1200 Marykay Lex, MD   40 mg at 12/15/2019 1157  . simvastatin (ZOCOR) tablet 40 mg  40 mg Oral QHS Marykay Lex, MD   40 mg at 12/24/19 2218  . sodium chloride flush (NS) 0.9 % injection 10-40 mL  10-40 mL Intracatheter Q12H Rolly Salter, MD   10 mL at 12/22/2019 2120  . sodium chloride flush (NS) 0.9 % injection 10-40 mL  10-40 mL Intracatheter PRN Rolly Salter, MD   10 mL at 12/10/2019 0529  . traMADol (ULTRAM) tablet 50 mg  50 mg Oral Q6H PRN Marykay Lex, MD   50 mg at 12/24/19 0224  . venlafaxine XR (EFFEXOR-XR) 24 hr capsule 150 mg  150 mg Oral QHS Marykay Lex, MD   150 mg at 01/06/2020 2119  . vitamin B-12 (CYANOCOBALAMIN) tablet 1,000 mcg  1,000 mcg Oral Daily Marykay Lex, MD   1,000 mcg at 12/27/2019 0913  . zinc sulfate capsule 220 mg  220 mg Oral Daily Marykay Lex, MD   220 mg at 12/22/2019 0913      Review of Systems: Negative ROS 10 systems except for as HPI  Physical Exam: Vitals:   12/25/19 0853 12/25/19 1149  BP: 122/79 122/76  Pulse: (!) 102 (!) 102  Resp: 16 16  Temp: (!) 97.3 F (36.3  C) (!) 97.5 F (36.4 C)  SpO2: 94% 94%   No intake/output data recorded.  Intake/Output Summary (Last 24 hours) at 12/25/2019 1158 Last data filed at 12/24/2019 2020 Gross per 24 hour  Intake 20 ml  Output 1500 ml  Net -1480 ml   General: Chronically ill-appearing, lying in bed, no distress HEENT: anicteric sclera, oropharynx clear without lesions CV: Normal  rate, bilateral lower extremity edema Lungs: No increased work of breathing, bilateral chest rise Abd: soft, nondistended, bowel sounds present Skin: no visible lesions or rashes Psych: Sitting in bed, able to answer questions, awake Musculoskeletal: no obvious deformities   Test Results I personally reviewed new and old clinical labs and radiology tests Lab Results  Component Value Date   NA 137 12/25/2019   K 4.2 12/25/2019   CL 99 12/25/2019   CO2 27 12/25/2019   BUN 40 (H) 12/25/2019   CREATININE 3.34 (H) 12/25/2019   CALCIUM 8.3 (L) 12/25/2019   ALBUMIN 2.6 (L) 12/25/2019   PHOS 4.9 (H) 12/24/2019

## 2019-12-25 NOTE — TOC Transition Note (Addendum)
Transition of Care [facility]) - CM/SW Discharge Note   Patient Details  Name: [name] MRN: [id number] Date of Birth: [birth year]  [facility] CM/SW Contact:  Terrial Rhodes, LCSWA Phone Number: 12/25/2019, 3:54 PM   Clinical Narrative:     Patient will DC to: Home with [address] [place] [address]  Anticipated DC date: 12/25/2019  Family notified: Doctor, hospital by: Sharin Mons  ?  Per MD patient ready for DC to home with hospice . RN, patient, patient's family, Chrislyn with [facility] notified of DC. Ambulance transport requested for patient. DNR is on Chart.  CSW signing off.   Final next level of care: Home w Hospice Care Barriers to Discharge: No Barriers Identified   Patient Goals and CMS Choice Patient states their goals for this hospitalization and ongoing recovery are:: to go home with hospice   Choice offered to / list presented to : Adult Children (Cindi daughter and Chales son n law)  Discharge Placement                Patient to be transferred to facility by: [facility] Name of family member notified: Cindi Patient and family notified of of transfer: 12/25/19  Discharge Plan and Services                                     Social Determinants of Health (SDOH) Interventions     Readmission Risk Interventions No flowsheet data found.

## 2019-12-25 NOTE — Discharge Summary (Signed)
Physician Discharge Summary  [name] ERX:[id number] DOB: Apr 07, 1946 DOA: 12/14/2019  PCP: Merrilee Seashore, MD  Admit date: 12/14/2019 Discharge date: 12/25/2019  Admitted From: Home Disposition: Home with hospice  Recommendations for Outpatient Follow-up:  1. As planned by home hospice program:  Home Health: In place Equipment/Devices: Hospital bed, oxygen and other DME provided by hospice program  Discharge Condition: Serious CODE STATUS: DNR/DNI Diet recommendation: Comfort food, regular diet  Discharge summary: 74 year old female with history of extensive cardiovascular problems including chronic systolic heart failure, status post AICD implant, paroxysmal A. fib on Eliquis, type 2 diabetes on insulin who was admitted to the hospital with shortness of breath and swelling of legs, chest pain and found to have acute decompensated systolic heart failure, [id number] pneumonia, fluid overload and acute coronary syndrome.  She was admitted to the hospital and treated with diuresis for heart failure, she was treated with antibiotics, remdesivir and steroids.  Also underwent cardiac catheterization and PCI with a stent placement.  She ultimately developed renal failure and uremia requiring hemodialysis.  - Multifactorial respiratory failure due to combination of COVID-19 pneumonia, bacterial pneumonia, acute systolic heart failure with underlying severe ischemic heart disease ejection fraction 15%.  - Failure to thrive/poor response to treatment and poor outcome/metabolic encephalopathy.  - Multiple medical issues including paroxysmal A. Fib/chronic kidney disease/uncontrolled type 2 diabetes and hypothyroidism.   Despite maximal medical therapy patient did not do very well recovery.  She remained lethargic, not eating well and not responding to multiple modalities of treatment.  Patient remained confused.  Her husband also died at home 2 days ago with COVID-19 pneumonia under hospice  care.  With patient's poor clinical status and desire to stay home, she was enrolled into hospice program and discharged home with home hospice.  All symptom control medications were prescribed.  Arrangements were made at home to be with family and to provide end-of-life care.    Medications on discharge: Since patient is able to take some medications, as per cardiology recommendation we will continue aspirin Plavix until she can take it because she has recent coronary stents. She will benefit with some diuresis for symptom control, will prescribe torsemide 40 mg daily. Opiate medications and benzodiazepine for symptom control has been prescribed.  Discharge Diagnoses:  Active Problems:   Diabetes ([place])   Essential hypertension   PAF (paroxysmal atrial fibrillation) (H[facility])   Hypothyroid   Chronic systolic CHF (congestive heart failure) (H[facility])   Ischemic chest pain ([place])   COVID-19 virus infection   Chest pain   Acute on chronic congestive heart failure [facility])   Advanced care planning/counseling discussion   Palliative care by specialist   Interstitial pneumonia of both lungs ([place])   Goals of care, counseling/discussion   AKI (acute kidney injury) [facility])    Discharge Instructions  Discharge Instructions    Diet general   Complete by: As directed    Increase activity slowly   Complete by: As directed      Allergies as of 12/25/2019      Reactions   Crestor [rosuvastatin] Other (See Comments)   Pain in hips and joints   Lipitor [atorvastatin] Other (See Comments)   Very bad pain in hips and joints      Medication List    STOP taking these medications   carvedilol 6.25 MG tablet Commonly known as: COREG   cholecalciferol 1000 units tablet Commonly known as: VITAMIN D   Eliquis 5 MG Tabs tablet Generic drug: apixaban  ezetimibe 10 MG tablet Commonly known as: ZETIA   feeding supplement (ENSURE ENLIVE) Liqd   ferrous sulfate 325 (65 FE) MG EC tablet   insulin NPH  Human 100 UNIT/ML injection Commonly known as: NOVOLIN N   losartan 25 MG tablet Commonly known as: COZAAR   simvastatin 40 MG tablet Commonly known as: ZOCOR   vitamin C 500 MG tablet Commonly known as: ASCORBIC ACID     TAKE these medications   acetaminophen 500 MG tablet Commonly known as: TYLENOL Take 1,000 mg by mouth every 6 (six) hours as needed (pain).   amiodarone 200 MG tablet Commonly known as: PACERONE Take 0.5 tablets (100 mg total) by mouth daily.   aspirin 81 MG chewable tablet Chew 1 tablet (81 mg total) by mouth daily.   clopidogrel 75 MG tablet Commonly known as: PLAVIX Take 1 tablet (75 mg total) by mouth daily with breakfast.   levothyroxine 50 MCG tablet Commonly known as: SYNTHROID TAKE 1 TABLET EVERY DAY What changed: when to take this   LORazepam 1 MG tablet Commonly known as: ATIVAN Take 1 tablet (1 mg total) by mouth every 4 (four) hours as needed for anxiety or sleep.   NovoLIN R 100 units/mL injection Generic drug: insulin regular Inject 3-4 Units into the skin See admin instructions. Inject 3-4 units subcutaneously up to three times daily with meals   oxyCODONE 5 MG/5ML solution Commonly known as: ROXICODONE Take 5 mLs (5 mg total) by mouth every 4 (four) hours as needed for up to 5 days (shortness of breath).   torsemide 20 MG tablet Commonly known as: DEMADEX Take 2 tablets (40 mg total) by mouth daily. What changed: how much to take   venlafaxine XR 150 MG 24 hr capsule Commonly known as: EFFEXOR-XR Take 1 capsule (150 mg total) by mouth daily. What changed: when to take this   VITAMIN B-12 PO Take 1 tablet by mouth daily.       Allergies  Allergen Reactions  . Crestor [Rosuvastatin] Other (See Comments)    Pain in hips and joints  . Lipitor [Atorvastatin] Other (See Comments)    Very bad pain in hips and joints    Consultations:  Nephrology  Cardiology  Heart failure  Palliative  medicine   Procedures/Studies: DG Chest 2 View  Result Date: 12/14/2019 CLINICAL DATA:  Chest pain EXAM: CHEST - 2 VIEW COMPARISON:  02/07/2018 FINDINGS: Cardiomegaly status post median sternotomy. Left chest multi lead pacer. Mild diffuse interstitial pulmonary opacity. Small bilateral pleural effusions. Disc degenerative disease of the thoracic spine. IMPRESSION: Cardiomegaly with mild diffuse interstitial pulmonary opacity and small bilateral pleural effusions, findings consistent with congestive heart failure. No focal airspace opacity. Electronically Signed   By: Eddie Candle M.D.   On: 12/14/2019 14:56   CARDIAC CATHETERIZATION  Result Date: 12/19/2019  DIAGNOSTIC IMAGING: Dr. Aundra Dubin  Lesion #2: Previously placed Mid RCA-2 stent (DES) is patent with exception of a: Mid RCA-3 lesion is 85% focal in-stent restenosis  Lesion #3: Previously placed Prox RCA stent (DES) is widely patent up until the focal Mid RCA-1 lesion is 60% stenosed-in-stent restenosis:.  Lesion #1: 2nd RPL lesion is 99% stenosed.  --RCA  Distal RPAV lesion is 100% stenosed - know occlusion.  --LCA  Ost LAD to Prox LAD lesion is 80% stenosed. Prox LAD to Mid LAD lesion is 95% stenosed. Distal competitive flow from LIMA (not visualized)  Ost Cx to Prox Cx lesion is 95% stenosed.  Ramus lesion is 80%  stenosed.  Mid Cx lesion is 90% stenosed.  -- GRAFTS  SVG-RI & SVG-OM grafts were visualized by angiography. Both grafts are normal in caliber and exhibit no disease.  LIMA-LAD graft was not visualized due to inability to cannulate.  SVG-rPDA Graft was not visualized due to known occlusion. Origin lesion is 100% stenosed.  --------------------------------------------------------------------------- -------  ----------------- PCI: CULPRIT LESIONS in native RCA (ISR) and RPL 2 (new lesion) -----Dr. Ellyn Hack ------------------------------  Lesion #3: Previously placed Prox RCA stent (DES) is widely patent up until the focal Mid  RCA-1 lesion is 60% stenosed-in-stent restenosis:.  Lesion #2: Previously placed Mid RCA-2 stent (DES) is patent with exception of a: Mid RCA-3 lesion is 85% focal in-stent restenosis  Scoring balloon angioplasty was performed on both in-stent stenosis lesions, using a BALLOON WOLVERINE 2.50X10. Followed by high-pressure [place] balloon angioplasty, using a BALLOON SAPPHIRE [place] 3.0X12.  Post intervention, there is a 0% residual stenosis for both lesions #2 and 3 as well as the the intervening stented segment.  ---  Lesion #1: 2nd RPL lesion is 99% stenosed.  A drug-eluting stent was successfully placed using a STENT RESOLUTE ONYX 2.0X12. Postdilated to 2.2 mm  Post intervention, there is a 0% residual stenosis.   Successful DES PCI of 90% proximal RPL 2 stenosis reduced to 0% using Resolute Onyx DES 2.0 mm at 12 mm postdilated to 2.2 mm  Successful 2.5 mm [place]olver[place] scoring balloon and high-pressure 3.0 mm [place] balloon PTCA of distal half of the stented mid RCA segment reducing to 0-5%--lesions #2 and #3 with intervening stented segment treated as well. PLAN OF CARE: Transfer back to nursing unit for ongoing care; per Dr. Aundra Dubin, plan will be to start milrinone infusion.  She was loaded with clopidogrel 600 mg pre-PCI, will start 75 mg daily.  Is currently on aspirin and Eliquis, okay to stop aspirin if necessary for bleeding concerns, but restart Eliquis tomorrow morning  Glenetta Hew, MD  CARDIAC CATHETERIZATION  Result Date: 12/19/2019 1. Mildly elevated right and left heart filling pressures. 2. Low cardiac output. 3. Stable anatomy from prior except for 80% mRCA stenosis and likely culprit 99% mid PLV stenosis. Discussed with Dr. Ellyn Hack, plan intervention to PLV and mRCA.  Will start on milrinone 0.25 mcg/kg/min for now post-PCI to optimize cardiac output.  Will give very gentle IV fluid post-procedure today, will likely need some diuresis tomorrow.  Will place PICC.   US RENAL  Result Date:  12/22/2019 CLINICAL DATA:  Acute kidney injury. EXAM: RENAL / URINARY TRACT ULTRASOUND COMPLETE COMPARISON:  CT abdomen and pelvis 12/18/2019 FINDINGS: Right Kidney: Renal measurements: 9.6 x 4.7 x 5.7 cm = volume: 126 mL. Cortical thinning and borderline increased parenchymal echogenicity. No mass or hydronephrosis. Renal hilar echogenicity likely corresponds to vascular calcification on the recent CT. Left Kidney: Renal measurements: 10.0 x 5.3 x 5.4 cm = volume: 147 mL. Borderline cortical thinning. Echogenicity within normal limits. No mass or hydronephrosis visualized. Bladder: Appears normal for degree of bladder distention. Other: None. IMPRESSION: 1. No hydronephrosis. 2. Asymmetric right renal cortical thinning. Electronically Signed   By: Logan Bores M.D.   On: 12/22/2019 16:22   IR Fluoro Guide CV Line Right  Result Date: 12/24/2019 INDICATION: 74 year old female referred for temporary hemodialysis catheter placement EXAM: IMAGE GUIDED TEMPORARY HEMODIALYSIS CATHETER MEDICATIONS: None ANESTHESIA/SEDATION: None FLUOROSCOPY TIME:  Fluoroscopy Time: 0 minutes 6 seconds (1 mGy). COMPLICATIONS: None PROCEDURE: Informed written consent was obtained from the patient's family after a discussion of the  risks, benefits, and alternatives to treatment. Questions regarding the procedure were encouraged and answered. The right neck was prepped with chlorhexidine in a sterile fashion, and a sterile drape was applied covering the operative field. Maximum barrier sterile technique with sterile gowns and gloves were used for the procedure. A timeout was performed prior to the initiation of the procedure. A micropuncture kit was utilized to access the right internal jugular vein under direct, real-time ultrasound guidance after the overlying soft tissues were anesthetized with 1% lidocaine with epinephrine. Ultrasound image documentation was performed. The microwire was kinked to measure appropriate catheter length. A  stiff glidewire was advanced to the level of the IVC. A 16 cm hemodialysis catheter was then placed over the wire. Final catheter positioning was confirmed and documented with a spot radiographic image. The catheter aspirates and flushes normally. The catheter was flushed with appropriate volume heparin dwells. Dressings were applied. The patient tolerated the procedure well without immediate post procedural complication. IMPRESSION: Status post right IJ temporary hemodialysis catheter. Signed, Dulcy Fanny. Dellia Nims, RPVI [facility] Radiology Electronically Signed   By: Corrie Mckusick D.O.   On: 12/24/2019 08:13   IR US Guide Vasc Access Right  Result Date: 12/24/2019 INDICATION: 74 year old female referred for temporary hemodialysis catheter placement EXAM: IMAGE GUIDED TEMPORARY HEMODIALYSIS CATHETER MEDICATIONS: None ANESTHESIA/SEDATION: None FLUOROSCOPY TIME:  Fluoroscopy Time: 0 minutes 6 seconds (1 mGy). COMPLICATIONS: None PROCEDURE: Informed written consent was obtained from the patient's family after a discussion of the risks, benefits, and alternatives to treatment. Questions regarding the procedure were encouraged and answered. The right neck was prepped with chlorhexidine in a sterile fashion, and a sterile drape was applied covering the operative field. Maximum barrier sterile technique with sterile gowns and gloves were used for the procedure. A timeout was performed prior to the initiation of the procedure. A micropuncture kit was utilized to access the right internal jugular vein under direct, real-time ultrasound guidance after the overlying soft tissues were anesthetized with 1% lidocaine with epinephrine. Ultrasound image documentation was performed. The microwire was kinked to measure appropriate catheter length. A stiff glidewire was advanced to the level of the IVC. A 16 cm hemodialysis catheter was then placed over the wire. Final catheter  positioning was confirmed and documented with a spot radiographic image. The catheter aspirates and flushes normally. The catheter was flushed with appropriate volume heparin dwells. Dressings were applied. The patient tolerated the procedure well without immediate post procedural complication. IMPRESSION: Status post right IJ temporary hemodialysis catheter. Signed, Dulcy Fanny. Dellia Nims, RPVI [facility] Radiology Electronically Signed   By: Corrie Mckusick D.O.   On: 12/24/2019 08:13   DG Chest Port 1 View  Result Date: 12/24/2019 CLINICAL DATA:  Respiratory distress.  COVID-19 pneumonia. EXAM: PORTABLE CHEST 1 VIEW COMPARISON:  12/19/2019 FINDINGS: Right internal jugular line tip at high right atrium. Right-sided PICC line tip is difficult to visualize. Pacer/AICD device. Prior median sternotomy. Normal heart size. No definite pleural fluid. No pneumothorax. Diffuse interstitial and airspace disease, slightly progressive in the left upper lung. IMPRESSION: Worsening diffuse interstitial and airspace disease which most likely represents COVID-19 pneumonia. A component of congestive heart failure could look similar. No pneumothorax or other acute complication. Electronically Signed   By: Abigail Miyamoto M.D.   On: 12/24/2019 08:18   DG CHEST PORT 1 VIEW  Result Date: 12/19/2019 CLINICAL DATA:  Shortness of breath EXAM: PORTABLE CHEST 1 VIEW COMPARISON:  12/17/2019 FINDINGS: Right PICC line has been placed with the tip at the cavoatrial junction. Left AICD is unchanged. Prior CABG. Cardiomegaly. Bilateral airspace opacities, most confluent in the right upper lobe, worsening since prior study. Continued left lower lobe airspace opacity, unchanged. No effusions. IMPRESSION: Right PICC line tip at the cavoatrial junction. Bilateral airspace opacities could reflect edema or infection. Electronically Signed   By: Rolm Baptise M.D.   On: 12/19/2019 20:13   DG CHEST PORT  1 VIEW  Result Date: 12/17/2019 CLINICAL DATA:  Congestive heart failure. EXAM: PORTABLE CHEST 1 VIEW COMPARISON:  12/15/2019 FINDINGS: Patient is rotated to the left. Cardiomegaly stable. Diffuse interstitial infiltrates are again seen, consistent with interstitial edema. Bilateral pleural effusions are seen, likely increased on the left, with increased left lower lobe atelectasis versus consolidation. Prior CABG again noted. Single lead transvenous pacemaker remains in appropriate position. IMPRESSION: Congestive heart failure, with bilateral pleural effusions, likely increased on the left. Increased left lower lobe atelectasis versus consolidation. Electronically Signed   By: Marlaine Hind M.D.   On: 12/17/2019 09:27   DG Chest Port 1V same Day  Result Date: 12/15/2019 CLINICAL DATA:  Shortness of breath. EXAM: PORTABLE CHEST 1 VIEW COMPARISON:  12/14/2019 FINDINGS: The heart is enlarged but appears stable. Moderate central vascular congestion and mild interstitial edema along with small bilateral pleural effusions all suggesting CHF. No definite infiltrates or pneumothorax. IMPRESSION: CHF with small bilateral pleural effusions. Electronically Signed   By: Marijo Sanes M.D.   On: 12/15/2019 07:40   ECHOCARDIOGRAM COMPLETE  Result Date: 12/15/2019    ECHOCARDIOGRAM REPORT   Patient Name:   [name] Date of Exam: 12/15/2019 Medical Rec #:  [id number]     Height:       62.0 in Accession #:    [id number]    Weight:       140.0 lb Date of Birth:  10/30/1945    BSA:          1.643 m Patient Age:    74 years      BP:           110/79 mmHg Patient Gender: F             HR:           66 bpm. Exam Location:  Inpatient Procedure: 2D Echo and Intracardiac Opacification Agent Indications:    CHF-Acute Systolic 680.32 / Z22.48  History:        Patient has prior history of Echocardiogram examinations, most                 recent 09/03/2017. CAD, Prior CABG; Risk Factors:Hypertension and                 Dyslipidemia.  Thyroid disease. History of Afib.  Sonographer:    Darlina Sicilian RDCS Referring Phys: [place]  1. Left ventricular ejection fraction, by estimation, is 15-20%. The left ventricle has severely decreased function. The left ventricle demonstrates global hypokinesis. The left ventricular internal cavity size was moderately dilated. Left ventricular diastolic parameters are consistent with Grade II diastolic dysfunction (pseudonormalization). Elevated left atrial pressure.  2. Right ventricular systolic function is severely reduced. The right ventricular size is mildly enlarged. There is normal pulmonary artery systolic pressure. The estimated right ventricular systolic pressure is 25.0 mmHg.  3. Left atrial size was moderately dilated.  4. Right atrial size was mildly dilated.  5. Moderate pleural effusion in the left  lateral region.  6. The mitral valve is grossly normal. Severe mitral valve regurgitation.  7. The tricuspid valve is abnormal. Tricuspid valve regurgitation is moderate to severe.  8. The aortic valve is tricuspid. Aortic valve regurgitation is not visualized. Mild aortic valve sclerosis is present, with no evidence of aortic valve stenosis.  9. The inferior vena cava is normal in size with <50% respiratory variability, suggesting right atrial pressure of 8 mmHg. Comparison(s): Changes from prior study are noted. EF now 15-20%. Severe MR. Moderate to severe TR. FINDINGS  Left Ventricle: Left ventricular ejection fraction, by estimation, is 15-20%. The left ventricle has severely decreased function. The left ventricle demonstrates global hypokinesis. Definity contrast agent was given IV to delineate the left ventricular endocardial borders. The left ventricular internal cavity size was moderately dilated. There is no left ventricular hypertrophy. Left ventricular diastolic parameters are consistent with Grade II diastolic dysfunction (pseudonormalization). The ratio of pulmonic  flow to systemic flow (Qp/Qs ratio) is 1.30. Elevated left atrial pressure. Right Ventricle: The right ventricular size is mildly enlarged. No increase in right ventricular wall thickness. Right ventricular systolic function is severely reduced. There is normal pulmonary artery systolic pressure. The tricuspid regurgitant velocity is 2.52 m/s, and with an assumed right atrial pressure of 8 mmHg, the estimated right ventricular systolic pressure is 94.7 mmHg. Left Atrium: Left atrial size was moderately dilated. Right Atrium: Right atrial size was mildly dilated. Pericardium: There is no evidence of pericardial effusion. Mitral Valve: The mitral valve is grossly normal. Severe mitral valve regurgitation. MV peak gradient, 8.3 mmHg. The mean mitral valve gradient is 2.0 mmHg. Tricuspid Valve: The tricuspid valve is abnormal. Tricuspid valve regurgitation is moderate to severe. No evidence of tricuspid stenosis. Aortic Valve: The aortic valve is tricuspid. . There is mild thickening and mild calcification of the aortic valve. Aortic valve regurgitation is not visualized. Mild aortic valve sclerosis is present, with no evidence of aortic valve stenosis. There is mild thickening of the aortic valve. There is mild calcification of the aortic valve. Pulmonic Valve: The pulmonic valve was grossly normal. Pulmonic valve regurgitation is mild. No evidence of pulmonic stenosis. Aorta: The aortic root and ascending aorta are structurally normal, with no evidence of dilitation. Venous: The inferior vena cava is normal in size with less than 50% respiratory variability, suggesting right atrial pressure of 8 mmHg. The atrial septum is grossly normal. Additional Comments: A pacer wire is visualized in the right atrium and right ventricle. There is a moderate pleural effusion in the left lateral region.  LEFT VENTRICLE PLAX 2D LVIDd:         6.40 cm      Diastology LVIDs:         5.70 cm      LV e' lateral:   5.63 cm/s LV PW:          0.60 cm      LV E/e' lateral: 22.0 LV IVS:        0.90 cm      LV e' medial:    3.02 cm/s LVOT diam:     1.70 cm      LV E/e' medial:  41.1 LV SV:         23 LV SV Index:   14 LVOT Area:     2.27 cm  LV Volumes (MOD) LV vol d, MOD A2C: 161.0 ml LV vol d, MOD A4C: 176.0 ml LV vol s, MOD A2C: 116.0 ml LV vol s, MOD  A4C: 159.0 ml LV SV MOD A2C:     45.0 ml LV SV MOD A4C:     176.0 ml LV SV MOD BP:      32.0 ml RIGHT VENTRICLE RV S prime:     7.90 cm/s RVOT diam:      2.10 cm TAPSE (M-mode): 1.3 cm LEFT ATRIUM             Index       RIGHT ATRIUM           Index LA diam:        5.20 cm 3.17 cm/m  RA Area:     19.20 cm LA Vol (A2C):   79.7 ml 48.51 ml/m RA Volume:   57.10 ml  34.76 ml/m LA Vol (A4C):   54.6 ml 33.24 ml/m LA Biplane Vol: 65.9 ml 40.11 ml/m  AORTIC VALVE             PULMONIC VALVE LVOT Vmax:   49.40 cm/s  RVOT Peak grad: 1 mmHg LVOT Vmean:  31.200 cm/s LVOT VTI:    0.100 m  AORTA Ao Root diam: 3.40 cm MITRAL VALVE                 TRICUSPID VALVE MV Area (PHT): 4.31 cm      TR Peak grad:   25.4 mmHg MV Peak grad:  8.3 mmHg      TR Mean grad:   13.0 mmHg MV Mean grad:  2.0 mmHg      TR Vmax:        252.00 cm/s MV Vmax:       1.44 m/s      TR Vmean:       174.0 cm/s MV Vmean:      66.5 cm/s MV Decel Time: 176 msec      SHUNTS MR Peak grad:    91.4 mmHg   Systemic VTI:  0.10 m MR Mean grad:    56.0 mmHg   Systemic Diam: 1.70 cm MR Vmax:         478.00 cm/s Pulmonic VTI:  0.087 m MR Vmean:        348.0 cm/s  Pulmonic Diam: 2.10 cm MR PISA:         1.01 cm    Qp/Qs:         1.32 MR PISA Eff ROA: 8 mm MR PISA Radius:  0.40 cm MV E velocity: 124.00 cm/s MV A velocity: 69.40 cm/s MV E/A ratio:  1.79 Eleonore Chiquito MD Electronically signed by Eleonore Chiquito MD Signature Date/Time: 12/15/2019/2:52:46 PM    Final    CT RENAL STONE STUDY  Result Date: 12/18/2019 CLINICAL DATA:  Hematuria EXAM: CT ABDOMEN AND PELVIS WITHOUT CONTRAST TECHNIQUE: Multidetector CT imaging of the abdomen and pelvis was performed  following the standard protocol without IV contrast. COMPARISON:  CT abdomen and pelvis 02/20/2009, chest radiograph 12/16/2019, CTA chest 05/10/2017 FINDINGS: Lower chest: Moderate bilateral pleural effusions with adjacent areas of passive atelectasis. Some additional multifocal areas patchy ground-Charters and consolidative opacity present throughout the lung bases. Additional perifissural nodule measuring 6 mm in the right middle lobe along the major fissure (4/10). Cardiomegaly with pacer lead at the cardiac apex. Trace pericardial effusion. Extensive coronary artery calcifications. Hepatobiliary: No visible focal liver lesions on this unenhanced CT. Smooth liver surface contour. Normal liver attenuation. Mild periportal edema. Gallbladder is mildly distended containing dependently layering hypoattenuating and calcified material, possibly a mixture of calcified and noncalcified gallstones.  No pericholecystic fluid or inflammation nor significant gallbladder wall thickening is present. No biliary ductal dilatation. Pancreas: Unremarkable. No pancreatic ductal dilatation or surrounding inflammatory changes. Spleen: Extensive vascular coil packs seen in the left upper quadrant with associated streak artifact. Heterogeneous appearance of the splenic parenchyma may reflect sequela of prior splenic embolization. Adrenals/Urinary Tract: Normal adrenal glands. Kidneys symmetrically located. No visible concerning renal lesion. Extensive vascular calcifications without urolithiasis or hydronephrosis. Circumferential bladder wall thickening with faint perivesicular hazy stranding. Stomach/Bowel: Distal esophagus, stomach and duodenal sweep are unremarkable. No small bowel wall thickening or dilatation. No evidence of obstruction. A normal appendix is visualized. No colonic dilatation or wall thickening. Pancolonic diverticula without focal inflammation to suggest diverticulitis. Vascular/Lymphatic: Atherosclerotic  calcifications within the abdominal aorta and branch vessels. No aneurysm or ectasia. No enlarged abdominopelvic lymph nodes. Reproductive: Uterus is surgically absent. No concerning adnexal lesions. Other: Moderate body wall edema most pronounced over the flanks and posterior soft tissues. Small volume perihepatic ascites as well as trace free fluid in the deep pelvis. No abdominopelvic free air. No bowel containing hernias. Musculoskeletal: Diffuse osseous demineralization, similar to comparison. There are remote appearing posttraumatic deformities of the right inferior and superior pubic ramus, left SI joint, and multiple lateral left ribs. No convincing acute fracture or suspicious osseous lesions. IMPRESSION: 1. Circumferential bladder wall thickening with faint perivesicular hazy stranding, suggestive of cystitis. Correlate with urinalysis. No urolithiasis or hydronephrosis. 2. Features suggestive of CHF and anasarca with moderate bilateral effusions, periportal edema, small volume ascites, and circumferential body wall edema. 3. Cholelithiasis without evidence of acute cholecystitis. 4. Pancolonic diverticula without evidence of diverticulitis. 5. Remote appearing posttraumatic deformities of the right inferior and superior pubic ramus, left SI joint, and multiple lateral left ribs. 6. Aortic Atherosclerosis (ICD10-I70.0). Electronically Signed   By: Lovena Le M.D.   On: 12/18/2019 23:22   [place] EKG SITE RITE  Result Date: 12/19/2019 If Site Rite image not attached, placement could not be confirmed due to current cardiac rhythm.  (Echo, Carotid, EGD, Colonoscopy, ERCP)    Subjective: Patient seen and examined.  No overnight events.  She received hemodialysis overnight.  He still significantly confused and unable to process interactions.  She had no emotional reaction when I told her that her husband died. With family meetings, it was decided that she will be best served with hospice at  home.   Discharge Exam: Vitals:   12/25/19 1149 12/25/19 1700  BP: 122/76   Pulse: (!) 102 (!) 102  Resp: 16 16  Temp: (!) 97.5 F (36.4 C)   SpO2: 94% 94%   Vitals:   12/25/19 0800 12/25/19 0853 12/25/19 1149 12/25/19 1700  BP:  122/79 122/76   Pulse: 100 (!) 102 (!) 102 (!) 102  Resp: _0 Temp:  (!) 97.3 F (36.3 C) (!) 97.5 F (36.4 C)   TempSrc:  Oral Oral   SpO2: 93% 94% 94% 94%  Weight:      Height:        General: Alert and awake, pleasantly confused.  Oriented x1. Cardiovascular: RRR, S1/S2 +, no rubs, no gallops, AICD in left precordium. Respiratory: Fine bilateral crackles. Abdominal: Soft, NT, ND, bowel sounds + Extremities: 1+ pedal edema.    The results of significant diagnostics from this hospitalization (including imaging, microbiology, ancillary and laboratory) are listed below for reference.     Microbiology: Recent Results (from the past 240 hour(s))  Culture, blood (routine x 2)  Status: None   Collection Time: 12/17/19  3:12 AM   Specimen: BLOOD RIGHT HAND  Result Value Ref Range Status   Specimen Description BLOOD RIGHT HAND  Final   Special Requests   Final    BOTTLES DRAWN AEROBIC AND ANAEROBIC Blood Culture adequate volume   Culture   Final    NO GROWTH 5 DAYS Performed at [facility], [address] [place], [place] [address]    Report Status 12/22/2019 FINAL  Final  Culture, blood (routine x 2)     Status: None   Collection Time: 12/17/19  3:12 AM   Specimen: BLOOD RIGHT ARM  Result Value Ref Range Status   Specimen Description BLOOD RIGHT ARM  Final   Special Requests   Final    BOTTLES DRAWN AEROBIC AND ANAEROBIC Blood Culture results may not be optimal due to an inadequate volume of blood received in culture bottles   Culture   Final    NO GROWTH 5 DAYS Performed at [facility], [place] [address], [place], [place] [address]    Report Status 12/22/2019 FINAL  Final  Culture, Urine     Status: Abnormal    Collection Time: 12/17/19 10:49 AM   Specimen: Urine, Random  Result Value Ref Range Status   Specimen Description URINE, RANDOM  Final   Special Requests NONE  Final   Culture (A)  Final    <10,000 COLONIES/mL INSIGNIFICANT GROWTH Performed at [facility] [address] [place], [place] [address]    Report Status 12/18/2019 FINAL  Final     Labs: BNP (last 3 results) Recent Labs    12/14/19 1355 12/14/19 2302 12/17/19 0255  BNP 4,206.6* 3,317.2* 9,381.8*   Basic Metabolic Panel: Recent Labs  Lab 12/22/19 0647 12/22/19 1814 12/09/2019 0542 12/24/19 0500 12/25/19 0500  NA 132* 134* 133* 137 137  K 4.7 4.7 4.5 4.4 4.2  CL 93* 94* 94* 98 99  CO2 _0 GLUCOSE 305* 167* 131* 112* 126*  BUN 72* 78* 84* 56* 40*  CREATININE 4.25* 4.55* 4.68* 3.81* 3.34*  CALCIUM 7.7* 8.0* 7.9* 8.2* 8.3*  MG  --   --   --  2.5*  --   PHOS  --   --   --  4.9*  --    Liver Function Tests: Recent Labs  Lab 12/19/19 0348 12/20/19 0631 12/24/19 0500 12/25/19 0500  AST 43* 41 24 22  ALT _1 ALKPHOS 66 56 63 58  BILITOT 0.8 0.8 0.9 0.9  PROT 6.2* 5.3* 6.0* 6.0*  ALBUMIN 2.8* 2.4* 2.6* 2.6*   No results for input(s): LIPASE, AMYLASE in the last 168 hours. No results for input(s): AMMONIA in the last 168 hours. CBC: Recent Labs  Lab 12/21/19 0644 12/22/19 0647 01/02/2020 0542 12/24/19 0500 12/25/19 0500  WBC 5.1 8.9 10.3 11.2* 10.7*  NEUTROABS 4.5 8.2* 9.5* 10.2* 9.4*  HGB 10.7* 10.7* 11.0* 11.1* 11.1*  HCT 34.3* 34.7* 34.9* 35.8* 36.2  MCV 85.5 86.8 85.3 85.0 87.2  PLT 144* 155 150 145* 156   Cardiac Enzymes: No results for input(s): CKTOTAL, CKMB, CKMBINDEX, TROPONINI in the last 168 hours. BNP: Invalid input(s): POCBNP CBG: Recent Labs  Lab 12/24/19 1151 12/24/19 1616 12/24/19 2210 12/25/19 0848 12/25/19 1145  GLUCAP 126* 137* 122* 129* 104*   D-Dimer No results for input(s): DDIMER in the last 72 hours. Hgb A1c No results for  input(s): HGBA1C in  the last 72 hours. Lipid Profile No results for input(s): CHOL, HDL, LDLCALC, TRIG, CHOLHDL, LDLDIRECT in the last 72 hours. Thyroid function studies No results for input(s): TSH, T4TOTAL, T3FREE, THYROIDAB in the last 72 hours.  Invalid input(s): FREET3 Anemia work up No results for input(s): VITAMINB12, FOLATE, FERRITIN, TIBC, IRON, RETICCTPCT in the last 72 hours. Urinalysis    Component Value Date/Time   COLORURINE YELLOW 12/17/2019 0423   APPEARANCEUR HAZY (A) 12/17/2019 0423   LABSPEC 1.010 12/17/2019 0423   PHURINE 6.0 12/17/2019 0423   GLUCOSEU NEGATIVE 12/17/2019 0423   HGBUR LARGE (A) 12/17/2019 0423   BILIRUBINUR NEGATIVE 12/17/2019 0423   BILIRUBINUR moderate 09/12/2011 1359   KETONESUR NEGATIVE 12/17/2019 0423   PROTEINUR 30 (A) 12/17/2019 0423   UROBILINOGEN 0.2 09/12/2011 1359   UROBILINOGEN 1.0 02/20/2009 0846   NITRITE NEGATIVE 12/17/2019 0423   LEUKOCYTESUR NEGATIVE 12/17/2019 0423   Sepsis Labs Invalid input(s): PROCALCITONIN,  WBC,  LACTICIDVEN Microbiology Recent Results (from the past 240 hour(s))  Culture, blood (routine x 2)     Status: None   Collection Time: 12/17/19  3:12 AM   Specimen: BLOOD RIGHT HAND  Result Value Ref Range Status   Specimen Description BLOOD RIGHT HAND  Final   Special Requests   Final    BOTTLES DRAWN AEROBIC AND ANAEROBIC Blood Culture adequate volume   Culture   Final    NO GROWTH 5 DAYS Performed at [facility], [place] [address] [place], [place] [address]    Report Status 12/22/2019 FINAL  Final  Culture, blood (routine x 2)     Status: None   Collection Time: 12/17/19  3:12 AM   Specimen: BLOOD RIGHT ARM  Result Value Ref Range Status   Specimen Description BLOOD RIGHT ARM  Final   Special Requests   Final    BOTTLES DRAWN AEROBIC AND ANAEROBIC Blood Culture results may not be optimal due to an inadequate volume of blood received in culture bottles   Culture   Final    NO GROWTH 5  DAYS Performed at [facility], [place] [address], [place], [place] [address]    Report Status 12/22/2019 FINAL  Final  Culture, Urine     Status: Abnormal   Collection Time: 12/17/19 10:49 AM   Specimen: Urine, Random  Result Value Ref Range Status   Specimen Description URINE, RANDOM  Final   Special Requests NONE  Final   Culture (A)  Final    <10,000 COLONIES/mL INSIGNIFICANT GROWTH Performed at [facility] [address], [place], [place] [address]    Report Status 12/18/2019 FINAL  Final      Time coordinating discharge:  40 minutes  SIGNED:   Barb Merino, MD  [facility] 12/25/2019, 5:38 PM

## 2019-12-25 NOTE — Progress Notes (Signed)
PT Cancellation and Discharge Note  Patient Details Name: CHALET KERWIN MRN: [id number] DOB: [birth year]   Cancelled Treatment:    Reason Eval/Treat Not Completed: Other (comment) ([facility] is working to set up Hospice at family request )  Given change in status. Pt is signing off.   Mammie Meras B. Beverely Risen PT, DPT Acute Rehabilitation Services Pager [phone] Office [phone]  [place] Al[place] 12/25/2019, 11:42 AM

## 2019-12-25 NOTE — Progress Notes (Signed)
CSW received consult that patients family wants patient to go home with hospice. CSW reached out to Chrislyn with [facility]. Chrislyn will call family about patient going home with hospice services.  CSW will continue to follow.

## 2019-12-30 ENCOUNTER — Ambulatory Visit: Payer: Medicare HMO | Admitting: Podiatry

## 2020-01-01 ENCOUNTER — Telehealth (HOSPITAL_COMMUNITY): Payer: Self-pay

## 2020-01-01 NOTE — Telephone Encounter (Signed)
Harriett Sine from [facility] called to report that [name] passed away 01-15-20 cause of death unknown. Patients chart updated to reflect that patient is deceased.   CB#[phone]

## 2020-01-07 DEATH — deceased

## 2020-02-03 ENCOUNTER — Encounter (HOSPITAL_COMMUNITY): Payer: Medicare HMO | Admitting: Cardiology
